# Patient Record
Sex: Female | Born: 1955
Health system: Southern US, Community
[De-identification: ages and names within clinical notes are randomized; demographics above are authoritative.]

## PROBLEM LIST (undated history)

## (undated) DIAGNOSIS — R110 Nausea: Secondary | ICD-10-CM

## (undated) DIAGNOSIS — J449 Chronic obstructive pulmonary disease, unspecified: Secondary | ICD-10-CM

## (undated) DIAGNOSIS — I10 Essential (primary) hypertension: Secondary | ICD-10-CM

## (undated) DIAGNOSIS — R140 Abdominal distension (gaseous): Secondary | ICD-10-CM

## (undated) DIAGNOSIS — K219 Gastro-esophageal reflux disease without esophagitis: Secondary | ICD-10-CM

## (undated) DIAGNOSIS — K8050 Calculus of bile duct without cholangitis or cholecystitis without obstruction: Secondary | ICD-10-CM

## (undated) DIAGNOSIS — R197 Diarrhea, unspecified: Secondary | ICD-10-CM

## (undated) DIAGNOSIS — M1990 Unspecified osteoarthritis, unspecified site: Secondary | ICD-10-CM

## (undated) DIAGNOSIS — J45909 Unspecified asthma, uncomplicated: Secondary | ICD-10-CM

## (undated) HISTORY — PX: LAPAROSCOPY: SHX197

## (undated) HISTORY — DX: Essential (primary) hypertension: I10

## (undated) HISTORY — DX: Unspecified osteoarthritis, unspecified site: M19.90

## (undated) HISTORY — DX: Nausea: R11.0

## (undated) HISTORY — DX: Abdominal distension (gaseous): R14.0

## (undated) HISTORY — DX: Diarrhea, unspecified: R19.7

## (undated) HISTORY — DX: Calculus of bile duct without cholangitis or cholecystitis without obstruction: K80.50

---

## 1998-02-13 ENCOUNTER — Emergency Department (HOSPITAL_COMMUNITY): Admission: EM | Admit: 1998-02-13 | Discharge: 1998-02-13 | Payer: Self-pay | Admitting: Emergency Medicine

## 1998-02-13 ENCOUNTER — Encounter: Payer: Self-pay | Admitting: Emergency Medicine

## 1998-11-06 ENCOUNTER — Emergency Department (HOSPITAL_COMMUNITY): Admission: EM | Admit: 1998-11-06 | Discharge: 1998-11-06 | Payer: Self-pay | Admitting: Emergency Medicine

## 2000-06-24 ENCOUNTER — Encounter: Payer: Self-pay | Admitting: Gastroenterology

## 2000-06-24 ENCOUNTER — Encounter: Admission: RE | Admit: 2000-06-24 | Discharge: 2000-06-24 | Payer: Self-pay | Admitting: Gastroenterology

## 2001-06-23 ENCOUNTER — Encounter (INDEPENDENT_AMBULATORY_CARE_PROVIDER_SITE_OTHER): Payer: Self-pay | Admitting: Specialist

## 2001-06-23 ENCOUNTER — Ambulatory Visit (HOSPITAL_COMMUNITY): Admission: RE | Admit: 2001-06-23 | Discharge: 2001-06-23 | Payer: Self-pay | Admitting: Gastroenterology

## 2001-07-01 ENCOUNTER — Ambulatory Visit (HOSPITAL_COMMUNITY): Admission: RE | Admit: 2001-07-01 | Discharge: 2001-07-01 | Payer: Self-pay | Admitting: Gastroenterology

## 2001-07-01 ENCOUNTER — Encounter: Payer: Self-pay | Admitting: Gastroenterology

## 2001-07-13 ENCOUNTER — Encounter: Payer: Self-pay | Admitting: Gastroenterology

## 2001-07-13 ENCOUNTER — Ambulatory Visit (HOSPITAL_COMMUNITY): Admission: RE | Admit: 2001-07-13 | Discharge: 2001-07-13 | Payer: Self-pay | Admitting: Gastroenterology

## 2001-09-23 ENCOUNTER — Encounter: Admission: RE | Admit: 2001-09-23 | Discharge: 2001-09-23 | Payer: Self-pay | Admitting: Internal Medicine

## 2001-09-23 ENCOUNTER — Encounter: Payer: Self-pay | Admitting: Internal Medicine

## 2005-02-25 HISTORY — PX: OTHER SURGICAL HISTORY: SHX169

## 2005-08-23 ENCOUNTER — Ambulatory Visit: Payer: Self-pay

## 2006-05-21 ENCOUNTER — Ambulatory Visit: Payer: Self-pay

## 2006-05-27 ENCOUNTER — Ambulatory Visit: Payer: Self-pay

## 2006-11-16 ENCOUNTER — Emergency Department (HOSPITAL_COMMUNITY): Admission: EM | Admit: 2006-11-16 | Discharge: 2006-11-16 | Payer: Self-pay | Admitting: Family Medicine

## 2007-07-19 ENCOUNTER — Emergency Department (HOSPITAL_COMMUNITY): Admission: EM | Admit: 2007-07-19 | Discharge: 2007-07-19 | Payer: Self-pay | Admitting: Emergency Medicine

## 2008-03-04 ENCOUNTER — Emergency Department (HOSPITAL_COMMUNITY): Admission: EM | Admit: 2008-03-04 | Discharge: 2008-03-04 | Payer: Self-pay | Admitting: Family Medicine

## 2008-12-25 ENCOUNTER — Emergency Department (HOSPITAL_COMMUNITY): Admission: EM | Admit: 2008-12-25 | Discharge: 2008-12-25 | Payer: Self-pay | Admitting: Emergency Medicine

## 2010-06-11 LAB — URINE CULTURE
Colony Count: NO GROWTH
Culture: NO GROWTH

## 2010-06-11 LAB — POCT URINALYSIS DIP (DEVICE)
Glucose, UA: 100 mg/dL — AB
Protein, ur: 30 mg/dL — AB
Specific Gravity, Urine: 1.015 (ref 1.005–1.030)
Urobilinogen, UA: 2 mg/dL — ABNORMAL HIGH (ref 0.0–1.0)

## 2010-07-13 NOTE — Op Note (Signed)
[place]  Patient:    Sue Mitchell, Sue Mitchell Visit Number: [id number] MRN: [id number]          Service Type: END Location: ENDO Attending Physician:  Charna Elizabeth Dictated by:   Anselmo Rod, M.D. Proc. Date: 06/23/01 Admit Date:  06/23/2001   CC:         Kern Reap, M.D.   Operative Report  DATE OF BIRTH:  [birth year]  REFERRING PHYSICIAN:  Kern Reap, M.D.  PROCEDURE PERFORMED:  Colonoscopy with biopsies times two.  ENDOSCOPIST:  Anselmo Rod, M.D.  INSTRUMENT USED:  Olympus video colonoscope.  INDICATIONS FOR PROCEDURE:  The patient is a 55 year old African-American female with a history of rectal bleeding and change in bowel habits, rule out colonic polyps, masses, hemorrhoids, etc.  PREPROCEDURE PREPARATION:  Informed consent was procured from the patient. The patient was fasted for eight hours prior to the procedure and prepped with a bottle of magnesium citrate and a gallon of NuLytely the night prior to the procedure.  PREPROCEDURE PHYSICAL:  The patient had stable vital signs.  Neck supple. Chest clear to auscultation.  S1, S2 regular.  Abdomen soft with normal bowel sounds.  DESCRIPTION OF PROCEDURE:  The patient was placed in the left lateral decubitus position and sedated with 100 mg of Demerol and 8 mg of Versed intravenously.  Once the patient was adequately sedated and maintained on low-flow oxygen and continuous cardiac monitoring, the Olympus video colonoscope was advanced from the rectum to the cecum without difficulty. There was evidence of pandiverticular disease with more prominent changes in the left colon.  A small polyp  was biopsied from the rectosigmoid area.  No other abnormalities were noted.  The appendicular orifice and ileocecal valve were clearly visualized and photographed.  The terminal ileum appeared normal.  IMPRESSION: 1. Pandiverticulosis. 2. Small polyp biopsied from  rectosigmoid area. 3. No large masses or polyps seen. 4. No evidence of hemorrhoids.  RECOMMENDATIONS: 1. A high fiber diet has been recommended. 2. Await pathology results. 3. Outpatient follow-up in the next two weeks.Dictated by:   Anselmo Rod, M.D. Attending Physician:  Charna Elizabeth DD:  06/24/01 TD:  06/24/01 Job: [id number]

## 2010-07-31 ENCOUNTER — Ambulatory Visit
Admission: RE | Admit: 2010-07-31 | Discharge: 2010-07-31 | Disposition: A | Discharge status: Home / Self Care | Payer: BC Managed Care – PPO | Source: Ambulatory Visit | Attending: Internal Medicine | Admitting: Internal Medicine

## 2010-07-31 ENCOUNTER — Other Ambulatory Visit: Payer: Self-pay | Admitting: Internal Medicine

## 2010-07-31 DIAGNOSIS — R0989 Other specified symptoms and signs involving the circulatory and respiratory systems: Secondary | ICD-10-CM

## 2010-12-13 ENCOUNTER — Other Ambulatory Visit (HOSPITAL_COMMUNITY): Payer: Self-pay | Admitting: Internal Medicine

## 2010-12-13 ENCOUNTER — Ambulatory Visit (HOSPITAL_COMMUNITY)
Admission: RE | Admit: 2010-12-13 | Discharge: 2010-12-13 | Disposition: A | Discharge status: Home / Self Care | Payer: BC Managed Care – PPO | Source: Ambulatory Visit | Attending: Internal Medicine | Admitting: Internal Medicine

## 2010-12-13 DIAGNOSIS — R1011 Right upper quadrant pain: Secondary | ICD-10-CM

## 2010-12-13 DIAGNOSIS — R1031 Right lower quadrant pain: Secondary | ICD-10-CM

## 2010-12-13 DIAGNOSIS — K5730 Diverticulosis of large intestine without perforation or abscess without bleeding: Secondary | ICD-10-CM | POA: Insufficient documentation

## 2010-12-13 DIAGNOSIS — K5792 Diverticulitis of intestine, part unspecified, without perforation or abscess without bleeding: Secondary | ICD-10-CM

## 2010-12-13 LAB — COMPREHENSIVE METABOLIC PANEL
ALT: 19 U/L (ref 0–35)
AST: 20 U/L (ref 0–37)
Alkaline Phosphatase: 77 U/L (ref 39–117)
CO2: 29 mEq/L (ref 19–32)
Chloride: 102 mEq/L (ref 96–112)
GFR calc non Af Amer: 70 mL/min — ABNORMAL LOW (ref >90)
Glucose, Bld: 82 mg/dL (ref 70–99)
Potassium: 3.5 mEq/L (ref 3.5–5.1)
Sodium: 140 mEq/L (ref 135–145)
Total Bilirubin: 0.4 mg/dL (ref 0.3–1.2)

## 2010-12-13 LAB — LIPID PANEL
HDL: 75 mg/dL (ref >39)
LDL Cholesterol: 97 mg/dL (ref 0–99)

## 2010-12-13 LAB — DIFFERENTIAL
Basophils Relative: 0 % (ref 0–1)
Eosinophils Absolute: 0.1 10*3/uL (ref 0.0–0.7)
Neutro Abs: 3.5 10*3/uL (ref 1.7–7.7)
Neutrophils Relative %: 40 % — ABNORMAL LOW (ref 43–77)

## 2010-12-13 LAB — CBC
Hemoglobin: 15.2 g/dL — ABNORMAL HIGH (ref 12.0–15.0)
Platelets: 191 10*3/uL (ref 150–400)
RBC: 4.76 MIL/uL (ref 3.87–5.11)
WBC: 8.7 10*3/uL (ref 4.0–10.5)

## 2010-12-13 MED ORDER — IOHEXOL 300 MG/ML  SOLN
100.0000 mL | Freq: Once | INTRAMUSCULAR | Status: AC | PRN
  Administered 2010-12-13: 100 mL via INTRAVENOUS

## 2011-02-22 ENCOUNTER — Other Ambulatory Visit (HOSPITAL_COMMUNITY): Payer: Self-pay | Admitting: Internal Medicine

## 2011-02-22 DIAGNOSIS — R1011 Right upper quadrant pain: Secondary | ICD-10-CM

## 2011-02-28 ENCOUNTER — Ambulatory Visit (HOSPITAL_COMMUNITY)
Admission: RE | Admit: 2011-02-28 | Discharge: 2011-02-28 | Disposition: A | Discharge status: Home / Self Care | Payer: BC Managed Care – PPO | Source: Ambulatory Visit | Attending: Internal Medicine | Admitting: Internal Medicine

## 2011-02-28 DIAGNOSIS — R1011 Right upper quadrant pain: Secondary | ICD-10-CM | POA: Insufficient documentation

## 2011-02-28 DIAGNOSIS — K7689 Other specified diseases of liver: Secondary | ICD-10-CM | POA: Insufficient documentation

## 2011-03-21 ENCOUNTER — Encounter (INDEPENDENT_AMBULATORY_CARE_PROVIDER_SITE_OTHER): Payer: Self-pay | Admitting: General Surgery

## 2011-03-21 ENCOUNTER — Ambulatory Visit (INDEPENDENT_AMBULATORY_CARE_PROVIDER_SITE_OTHER): Payer: BC Managed Care – PPO | Admitting: General Surgery

## 2011-03-21 VITALS — BP 168/100 | HR 76 | Temp 97.9°F | Resp 18 | Ht 59.0 in | Wt 171.0 lb

## 2011-03-21 DIAGNOSIS — R1084 Generalized abdominal pain: Secondary | ICD-10-CM

## 2011-03-21 NOTE — Progress Notes (Signed)
Patient ID: Sue Mitchell, female   DOB: Apr 03, 1955, 56 y.o.   MRN: [id number]  Chief Complaint  Patient presents with  . Other    Eval biliary colic - patient having pain    HPI Sue Mitchell is a 56 y.o. female.  This patient is referred by Dr. Lovell Sheehan for evaluation of several month history of generalized postprandial abdominal pain and nausea. She states that she has had abdominal pain and nausea almost every time she eats and she describes the pain as "middle" pain across her lower abdomen which feels like she needs to go to the bathroom. She also complains of some right upper quadrant "tenderness" and some nausea but she rarely has any vomiting. She has a history of chronic constipation she states that this constipation will suddenly turned to loose stools and occasionally diarrhea. She reports formed but "pasty" stools and foul-smelling flatus. She has a significant bloating increased amount of intestinal gas. She denies any fevers but she has hot flashes. She denies any blood in the stool or melena. She does have reflux and she gets relief with Zantac. She states that her pain began after eating and lasts until she goes to the bathroom and then she gets relief of his discomfort. She does have a history of lactose intolerance and has had a colonoscopy approximately 10 years ago by Dr. Loreta Ave. She had a recent ultrasound which demonstrated no evidence of cholelithiasis and she had a HIDA scan many years ago which was normal. HPI  Past Medical History  Diagnosis Date  . Arthritis   . Hypertension   . Abdominal pain   . Abdominal distension   . Diarrhea   . Nausea   . Biliary colic     Past Surgical History  Procedure Date  . Cesarean section 1976  . Laparoscopy 1992 (approx)     abdominal  . Cartilage removal 2007    left knee    History reviewed. No pertinent family history.  Social History History  Substance Use Topics  . Smoking status: Never Smoker   . Smokeless  tobacco: Never Used  . Alcohol Use: Yes     occasional 2 per month    Allergies  Allergen Reactions  . Codeine Hives, Itching and Other (See Comments)    All over the body, including burning sensation.    Current Outpatient Prescriptions  Medication Sig Dispense Refill  . acyclovir (ZOVIRAX) 200 MG capsule as needed.      Marland Kitchen aspirin 325 MG tablet Take 325 mg by mouth as needed.      . Cholecalciferol (VITAMIN D PO) Take by mouth daily.      Marland Kitchen losartan (COZAAR) 25 MG tablet Take by mouth daily. Patient takes 1/2 of pill per dosage.        Review of Systems Review of Systems All other review of systems negative or noncontributory except as stated in the HPI  Blood pressure 168/100, pulse 76, temperature 97.9 F (36.6 C), temperature source Temporal, resp. rate 18, height 4\' 11"  (1.499 m), weight 171 lb (77.565 kg).  Physical Exam Physical Exam Physical Exam  Vitals reviewed. Constitutional: He is oriented to person, place, and time. He appears well-developed and well-nourished. No distress.  HENT:  Head: Normocephalic and atraumatic.  Mouth/Throat: No oropharyngeal exudate.  Eyes: Conjunctivae and EOM are normal. Pupils are equal, round, and reactive to light. Right eye exhibits no discharge. Left eye exhibits no discharge. No scleral icterus.  Neck: Normal range of motion. No  tracheal deviation present.  Cardiovascular: Normal rate, regular rhythm and normal heart sounds.   Pulmonary/Chest: Effort normal and breath sounds normal. No stridor. No respiratory distress. He has no wheezes. He has no rales. He exhibits no tenderness.  Abdominal: Soft. Bowel sounds are normal. He exhibits no distension and no mass. There is no significant tenderness but she does say that she has some mild LLQ tenderness on her exam today. There is no rebound and no guarding.  Musculoskeletal: Normal range of motion. He exhibits no edema and no tenderness.  Neurological: He is alert and oriented to  person, place, and time.  Skin: Skin is warm and dry. No rash noted. He is not diaphoretic. No erythema. No pallor.  Psychiatric: He has a normal mood and affect. His behavior is normal. Judgment and thought content normal.    Data Reviewed [place], HIDA  Assessment    Abdominal pain and irregular bowels No her symptoms could certainly be due to biliary dyskinesia we had no objective evidence to indicate that her gallbladder is culprit of her discomfort. She does have some symptoms which could be attributed to this prior history but she also has other symptoms which shy away from gallbladder type symptoms.  I have recommended that she have a repeat colonoscopy and followup with her gastroenterologist for further evaluation and workup of her abdominal pain. I have requested a HIDA scan as well. I am not convinced at this time that cholecystectomy would relieve her discomfort.    Plan    I placed a consult to gastroenterology for colonoscopy and further evaluation. I've also ordered a HIDA scan as well. She will followup with me after her HIDA scan and her GI evaluation to further discuss whether cholecystectomy will be performed.       Lodema Pilot DAVID 03/21/2011, 12:06 PM

## 2011-03-28 ENCOUNTER — Encounter (HOSPITAL_COMMUNITY)
Admission: RE | Admit: 2011-03-28 | Discharge: 2011-03-28 | Disposition: A | Discharge status: Home / Self Care | Payer: BC Managed Care – PPO | Source: Ambulatory Visit | Attending: General Surgery | Admitting: General Surgery

## 2011-03-28 DIAGNOSIS — R1084 Generalized abdominal pain: Secondary | ICD-10-CM | POA: Insufficient documentation

## 2011-03-28 MED ORDER — TECHNETIUM TC 99M MEBROFENIN IV KIT
5.0000 | PACK | Freq: Once | INTRAVENOUS | Status: AC | PRN
  Administered 2011-03-28: 5 via INTRAVENOUS

## 2011-03-28 MED ORDER — SINCALIDE 5 MCG IJ SOLR
INTRAMUSCULAR | Status: AC
  Administered 2011-03-28: 1.55 ug
  Filled 2011-03-28: qty 5

## 2011-03-28 MED ORDER — SINCALIDE 5 MCG IJ SOLR: 0.0200 ug/kg | Freq: Once | INTRAMUSCULAR | Status: DC

## 2011-04-03 ENCOUNTER — Telehealth (INDEPENDENT_AMBULATORY_CARE_PROVIDER_SITE_OTHER): Payer: Self-pay | Admitting: General Surgery

## 2011-04-05 NOTE — Telephone Encounter (Signed)
Patient would like her HIDA scan results, please review and advise.

## 2011-04-24 ENCOUNTER — Encounter (INDEPENDENT_AMBULATORY_CARE_PROVIDER_SITE_OTHER): Payer: BC Managed Care – PPO | Admitting: General Surgery

## 2011-08-15 ENCOUNTER — Encounter (INDEPENDENT_AMBULATORY_CARE_PROVIDER_SITE_OTHER): Payer: Self-pay

## 2011-11-29 ENCOUNTER — Emergency Department (HOSPITAL_COMMUNITY)
Admission: EM | Admit: 2011-11-29 | Discharge: 2011-11-30 | Disposition: A | Discharge status: Home / Self Care | Payer: BC Managed Care – PPO | Attending: Emergency Medicine | Admitting: Emergency Medicine

## 2011-11-29 ENCOUNTER — Encounter (HOSPITAL_COMMUNITY): Payer: Self-pay | Admitting: *Deleted

## 2011-11-29 DIAGNOSIS — M129 Arthropathy, unspecified: Secondary | ICD-10-CM | POA: Insufficient documentation

## 2011-11-29 DIAGNOSIS — Z885 Allergy status to narcotic agent status: Secondary | ICD-10-CM | POA: Insufficient documentation

## 2011-11-29 DIAGNOSIS — Z87891 Personal history of nicotine dependence: Secondary | ICD-10-CM | POA: Insufficient documentation

## 2011-11-29 DIAGNOSIS — Z79899 Other long term (current) drug therapy: Secondary | ICD-10-CM | POA: Insufficient documentation

## 2011-11-29 DIAGNOSIS — S161XXA Strain of muscle, fascia and tendon at neck level, initial encounter: Secondary | ICD-10-CM

## 2011-11-29 DIAGNOSIS — M25519 Pain in unspecified shoulder: Secondary | ICD-10-CM | POA: Insufficient documentation

## 2011-11-29 DIAGNOSIS — I10 Essential (primary) hypertension: Secondary | ICD-10-CM | POA: Insufficient documentation

## 2011-11-29 DIAGNOSIS — Z7982 Long term (current) use of aspirin: Secondary | ICD-10-CM | POA: Insufficient documentation

## 2011-11-29 DIAGNOSIS — S139XXA Sprain of joints and ligaments of unspecified parts of neck, initial encounter: Secondary | ICD-10-CM | POA: Insufficient documentation

## 2011-11-29 MED ORDER — IBUPROFEN 200 MG PO TABS
600.0000 mg | ORAL_TABLET | Freq: Once | ORAL | Status: AC
  Administered 2011-11-30: 600 mg via ORAL
  Filled 2011-11-29: qty 1

## 2011-11-29 MED ORDER — HYDROCODONE-ACETAMINOPHEN 5-325 MG PO TABS
1.0000 | ORAL_TABLET | Freq: Once | ORAL | Status: AC
  Administered 2011-11-30: 1 via ORAL
  Filled 2011-11-29: qty 1

## 2011-11-29 MED ORDER — CYCLOBENZAPRINE HCL 10 MG PO TABS
5.0000 mg | ORAL_TABLET | Freq: Once | ORAL | Status: AC
  Administered 2011-11-30: 5 mg via ORAL
  Filled 2011-11-29: qty 1

## 2011-11-29 NOTE — ED Notes (Signed)
Received bedside report form Jenna, RN.  Patient currently resting quietly in bed; no respiratory or acute distress noted.  Patient updated on plan of care; informed patient that we are currently waiting for further orders from EDP/NP.  Patient has no other questions or concerns at this time; will continue to monitor. 
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
## 2011-11-30 MED ORDER — CYCLOBENZAPRINE HCL 5 MG PO TABS: 5.0000 mg | ORAL_TABLET | Freq: Two times a day (BID) | ORAL | Status: DC | PRN

## 2011-11-30 MED ORDER — HYDROCODONE-ACETAMINOPHEN 5-325 MG PO TABS: 1.0000 | ORAL_TABLET | Freq: Four times a day (QID) | ORAL | Status: DC | PRN

## 2011-11-30 MED ORDER — IBUPROFEN 600 MG PO TABS: 600.0000 mg | ORAL_TABLET | Freq: Once | ORAL | Status: DC

## 2011-11-30 NOTE — ED Notes (Signed)
Patient given discharge paperwork; went over discharge instructions with patient.  Patient instructed to take prescriptions as directed, to not drive while taking narcotics, and to return to the ED for new, worsening, or concerning symptoms.
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
## 2011-12-01 NOTE — ED Provider Notes (Signed)
Medical screening examination/treatment/procedure(s) were performed by non-physician practitioner and as supervising physician I was immediately available for consultation/collaboration.    Candance Bohlman D Yuki Purves, MD 12/01/11 0134 

## 2012-03-14 ENCOUNTER — Encounter (HOSPITAL_COMMUNITY): Payer: Self-pay | Admitting: Emergency Medicine

## 2012-03-14 ENCOUNTER — Emergency Department (HOSPITAL_COMMUNITY)
Admission: EM | Admit: 2012-03-14 | Discharge: 2012-03-14 | Disposition: A | Discharge status: Home / Self Care | Payer: BC Managed Care – PPO | Attending: Emergency Medicine | Admitting: Emergency Medicine

## 2012-03-14 ENCOUNTER — Emergency Department (HOSPITAL_COMMUNITY): Payer: BC Managed Care – PPO

## 2012-03-14 DIAGNOSIS — Z79899 Other long term (current) drug therapy: Secondary | ICD-10-CM | POA: Insufficient documentation

## 2012-03-14 DIAGNOSIS — R059 Cough, unspecified: Secondary | ICD-10-CM | POA: Insufficient documentation

## 2012-03-14 DIAGNOSIS — R0602 Shortness of breath: Secondary | ICD-10-CM | POA: Insufficient documentation

## 2012-03-14 DIAGNOSIS — Z87891 Personal history of nicotine dependence: Secondary | ICD-10-CM | POA: Insufficient documentation

## 2012-03-14 DIAGNOSIS — Z7982 Long term (current) use of aspirin: Secondary | ICD-10-CM | POA: Insufficient documentation

## 2012-03-14 DIAGNOSIS — Z8739 Personal history of other diseases of the musculoskeletal system and connective tissue: Secondary | ICD-10-CM | POA: Insufficient documentation

## 2012-03-14 DIAGNOSIS — Z8719 Personal history of other diseases of the digestive system: Secondary | ICD-10-CM | POA: Insufficient documentation

## 2012-03-14 DIAGNOSIS — R112 Nausea with vomiting, unspecified: Secondary | ICD-10-CM | POA: Insufficient documentation

## 2012-03-14 DIAGNOSIS — R498 Other voice and resonance disorders: Secondary | ICD-10-CM | POA: Insufficient documentation

## 2012-03-14 DIAGNOSIS — R05 Cough: Secondary | ICD-10-CM | POA: Insufficient documentation

## 2012-03-14 DIAGNOSIS — J111 Influenza due to unidentified influenza virus with other respiratory manifestations: Secondary | ICD-10-CM

## 2012-03-14 DIAGNOSIS — I10 Essential (primary) hypertension: Secondary | ICD-10-CM | POA: Insufficient documentation

## 2012-03-14 DIAGNOSIS — R51 Headache: Secondary | ICD-10-CM | POA: Insufficient documentation

## 2012-03-14 MED ORDER — ALBUTEROL SULFATE HFA 108 (90 BASE) MCG/ACT IN AERS: 2.0000 | INHALATION_SPRAY | Freq: Four times a day (QID) | RESPIRATORY_TRACT | Status: DC | PRN

## 2012-03-14 MED ORDER — PROMETHAZINE HCL 25 MG PO TABS: 25.0000 mg | ORAL_TABLET | Freq: Four times a day (QID) | ORAL | Status: DC | PRN

## 2012-03-14 MED ORDER — ONDANSETRON 4 MG PO TBDP: 4.0000 mg | ORAL_TABLET | Freq: Three times a day (TID) | ORAL | Status: DC | PRN

## 2012-03-14 MED ORDER — GUAIFENESIN-CODEINE 100-10 MG/5ML PO SYRP: 5.0000 mL | ORAL_SOLUTION | Freq: Three times a day (TID) | ORAL | Status: DC | PRN

## 2012-03-14 NOTE — ED Provider Notes (Signed)
Medical screening examination/treatment/procedure(s) were performed by non-physician practitioner and as supervising physician I was immediately available for consultation/collaboration.  Meher Kucinski R. Favor Kreh, MD 03/14/12 2323 

## 2012-03-14 NOTE — ED Notes (Signed)
Patient reports that she has had a cough all night long and has a HA since she has started coughing

## 2012-03-14 NOTE — ED Provider Notes (Signed)
History  Scribed for Marlon Pel, PA-C/ Juliet Rude. Rubin Payor, MD, the patient was seen in room WTR8/WTR8. This chart was scribed by Candelaria Stagers. The patient's care started at 3:16 PM    CSN: [id number]  Arrival date & time 03/14/12  1312   First MD Initiated Contact with Patient 03/14/12 1512      Chief Complaint  Patient presents with  . Cough     The history is provided by the patient. No language interpreter was used.   Sue Mitchell is a 57 y.o. female who presents to the Emergency Department complaining of a cough that started several days ago.  Pt has also experiencing decreased voice, headache, SOB with coughing, nausea, and vomiting from coughing.  She reports that the cough is worse at night.  She has taken alka seltzer with no relief.  Pt does not smoke.  Pt did have a flue shot.  Pt is to return to work tomorrow.     Past Medical History  Diagnosis Date  . Arthritis   . Hypertension   . Abdominal pain   . Abdominal distension   . Diarrhea   . Nausea   . Biliary colic     Past Surgical History  Procedure Date  . Cesarean section 1976  . Laparoscopy 1992 (approx)     abdominal  . Cartilage removal 2007    left knee    History reviewed. No pertinent family history.  History  Substance Use Topics  . Smoking status: Former Games developer  . Smokeless tobacco: Never Used  . Alcohol Use: Yes     Comment: occasional 2 per month    OB History    Grav Para Term Preterm Abortions TAB SAB Ect Mult Living                  Review of Systems  Constitutional: Negative for fever and chills.  HENT: Positive for voice change. Negative for congestion and sore throat.   Respiratory: Positive for cough and shortness of breath.   Gastrointestinal: Positive for nausea and vomiting. Negative for abdominal pain.  Skin: Negative for rash.  Neurological: Negative for weakness.  All other systems reviewed and are negative.    Allergies  Codeine and  Ciprofloxacin  Home Medications   Current Outpatient Rx  Name  Route  Sig  Dispense  Refill  . ACYCLOVIR 200 MG PO CAPS   Oral   Take 200 mg by mouth 2 (two) times daily.          . ASPIRIN 325 MG PO TABS   Oral   Take 325 mg by mouth every morning.          Marland Kitchen VITAMIN D 1000 UNITS PO TABS   Oral   Take 5,000 Units by mouth 2 (two) times a week.         Marland Kitchen LOSARTAN POTASSIUM-HCTZ 100-25 MG PO TABS   Oral   Take 1 tablet by mouth every morning.         . ALBUTEROL SULFATE HFA 108 (90 BASE) MCG/ACT IN AERS   Inhalation   Inhale 2 puffs into the lungs every 6 (six) hours as needed for wheezing.   1 Inhaler   2   . ONDANSETRON 4 MG PO TBDP   Oral   Take 1 tablet (4 mg total) by mouth every 8 (eight) hours as needed for nausea.   20 tablet   0   . PROMETHAZINE HCL 25 MG PO TABS  Oral   Take 1 tablet (25 mg total) by mouth every 6 (six) hours as needed for nausea.   12 tablet   0     BP 112/95  Pulse 84  Temp 98.9 F (37.2 C) (Oral)  Resp 18  SpO2 100%  Physical Exam  Nursing note and vitals reviewed. Constitutional: She is oriented to person, place, and time. She appears well-developed and well-nourished. No distress.  HENT:  Head: Normocephalic and atraumatic.  Right Ear: Tympanic membrane normal.  Left Ear: Tympanic membrane normal.  Mouth/Throat: No oropharyngeal exudate.  Eyes: EOM are normal.  Neck: Neck supple. No tracheal deviation present.  Cardiovascular: Normal rate and regular rhythm.   Pulmonary/Chest: Effort normal and breath sounds normal. No respiratory distress. She has no wheezes. She has no rales.       Coughing during exam  Musculoskeletal: Normal range of motion.  Neurological: She is alert and oriented to person, place, and time.  Skin: Skin is warm and dry.  Psychiatric: She has a normal mood and affect. Her behavior is normal.    ED Course  Procedures   DIAGNOSTIC STUDIES: Oxygen Saturation is 100% on room air, normal by  my interpretation.    COORDINATION OF CARE:  2:05PM DG Chest 2 View  3:24PM Discharged with: promethazine (PHEGRAN) 25MG  tablet; albuterol (PROVENTIL HFA; VENTOLIN HFA) 108 (90 BASE) MCB/ACT inhaler; ondansetron (ZOFRAN ODT) 4mg  disintegrating tablet  Labs Reviewed - No data to display Dg Chest 2 View  03/14/2012  *RADIOLOGY REPORT*  Clinical Data: Cough and congestion.  Fever for 3 days.  Ex-smoker with history of hypertension.  CHEST - 2 VIEW  Comparison: 07/31/2010 and 12/25/2008.  Findings: Midline trachea.  Normal heart size.  Stable cardiomediastinal contours.  The right suprahilar region is similarly borderline full back to 2010; likely within normal variation. No pleural effusion or pneumothorax.  Clear lungs.  Diffuse peribronchial thickening.  IMPRESSION: 1.  No acute cardiopulmonary disease. 2.  Mild peribronchial thickening which may relate to chronic bronchitis or smoking.   Original Report Authenticated By: Jeronimo Greaves, M.D.      1. Influenza-like illness       MDM  Pt has been advised of the symptoms that warrant their return to the ED. Patient has voiced understanding and has agreed to follow-up with the PCP or specialist.  I personally performed the services described in this documentation, which was scribed in my presence. The recorded information has been reviewed and is accurate.         Dorthula Matas, PA 03/14/12 2159

## 2013-02-21 ENCOUNTER — Emergency Department (HOSPITAL_COMMUNITY)
Admission: EM | Admit: 2013-02-21 | Discharge: 2013-02-21 | Disposition: A | Discharge status: Home / Self Care | Payer: BC Managed Care – PPO | Source: Home / Self Care | Attending: Family Medicine | Admitting: Family Medicine

## 2013-02-21 ENCOUNTER — Encounter (HOSPITAL_COMMUNITY): Payer: Self-pay | Admitting: Emergency Medicine

## 2013-02-21 ENCOUNTER — Emergency Department (INDEPENDENT_AMBULATORY_CARE_PROVIDER_SITE_OTHER): Payer: BC Managed Care – PPO

## 2013-02-21 DIAGNOSIS — J40 Bronchitis, not specified as acute or chronic: Secondary | ICD-10-CM

## 2013-02-21 MED ORDER — IPRATROPIUM BROMIDE 0.06 % NA SOLN: 2.0000 | Freq: Four times a day (QID) | NASAL | Status: DC

## 2013-02-21 MED ORDER — HYDROCODONE-ACETAMINOPHEN 5-325 MG PO TABS: 0.5000 | ORAL_TABLET | Freq: Every evening | ORAL | Status: DC | PRN

## 2013-02-21 MED ORDER — ALBUTEROL SULFATE HFA 108 (90 BASE) MCG/ACT IN AERS: 2.0000 | INHALATION_SPRAY | Freq: Four times a day (QID) | RESPIRATORY_TRACT | Status: DC | PRN

## 2013-02-21 MED ORDER — PREDNISONE 50 MG PO TABS: 50.0000 mg | ORAL_TABLET | Freq: Every day | ORAL | Status: DC

## 2013-02-21 NOTE — ED Provider Notes (Signed)
Sue Mitchell is a 57 y.o. female who presents to Urgent Care today for cough body aches hoarse voice. Symptoms have been present for the last 4 days. Patient tried aspirin and Aleve which help a bit. The cough is quite bothersome at night. No shortness of breath nausea vomiting or diarrhea. Positive sick contacts. Patient feels well otherwise.   Past Medical History  Diagnosis Date  . Arthritis   . Hypertension   . Abdominal pain   . Abdominal distension   . Diarrhea   . Nausea   . Biliary colic    History  Substance Use Topics  . Smoking status: Former Games developer  . Smokeless tobacco: Never Used  . Alcohol Use: Yes     Comment: occasional 2 per month   ROS as above Medications reviewed. No current facility-administered medications for this encounter.   Current Outpatient Prescriptions  Medication Sig Dispense Refill  . acyclovir (ZOVIRAX) 200 MG capsule Take 200 mg by mouth 2 (two) times daily.       Marland Kitchen albuterol (PROVENTIL HFA;VENTOLIN HFA) 108 (90 BASE) MCG/ACT inhaler Inhale 2 puffs into the lungs every 6 (six) hours as needed for wheezing.  1 Inhaler  2  . aspirin 325 MG tablet Take 325 mg by mouth every morning.       . cholecalciferol (VITAMIN D) 1000 UNITS tablet Take 5,000 Units by mouth 2 (two) times a week.      Marland Kitchen HYDROcodone-acetaminophen (NORCO/VICODIN) 5-325 MG per tablet Take 0.5 tablets by mouth at bedtime as needed (cough).  6 tablet  0  . ipratropium (ATROVENT) 0.06 % nasal spray Place 2 sprays into both nostrils 4 (four) times daily.  15 mL  1  . losartan-hydrochlorothiazide (HYZAAR) 100-25 MG per tablet Take 1 tablet by mouth every morning.      . ondansetron (ZOFRAN ODT) 4 MG disintegrating tablet Take 1 tablet (4 mg total) by mouth every 8 (eight) hours as needed for nausea.  20 tablet  0  . predniSONE (DELTASONE) 50 MG tablet Take 1 tablet (50 mg total) by mouth daily.  5 tablet  0  . promethazine (PHENERGAN) 25 MG tablet Take 1 tablet (25 mg total) by mouth  every 6 (six) hours as needed for nausea.  12 tablet  0    Exam:  BP 113/74  Pulse 80  Temp(Src) 98.6 F (37 C) (Oral)  Resp 18  SpO2 94% Gen: Well NAD HEENT: EOMI,  MMM, posterior pharynx with cobblestoning. Tympanic membranes are normal appearing bilaterally. Lungs: Normal work of breathing. Rhonchorous breath sounds bilaterally Heart: RRR no MRG Abd: NABS, Soft. NT, ND Exts: Non edematous BL  LE, warm and well perfused.   No results found for this or any previous visit (from the past 24 hour(s)). Dg Chest 2 View  02/21/2013   CLINICAL DATA:  Cough  EXAM: CHEST  2 VIEW  COMPARISON:  03/14/2012  FINDINGS: Pulmonary hyperinflation. Negative for pneumonia. Negative for heart failure or effusion.  IMPRESSION: Pulmonary hyperinflation.  Negative for pneumonia.   Electronically Signed   By: Marlan Palau M.D.   On: 02/21/2013 19:54    Assessment and Plan: 57 y.o. female with bronchitis. Plan to treat with a but overall prednisone and hydrocodone-based cough medication, and Atrovent nasal spray. Discussed warning signs or symptoms. Please see discharge instructions. Patient expresses understanding.      Rodolph Bong, MD 02/21/13 2007

## 2013-02-21 NOTE — ED Notes (Signed)
C/o cold sx which started christmas night Sx are cough, body ache, headache, chest congestion, and fever Admits to vomiting Thursday and Friday  Denies diarrhea Aleve and asa was taking for relief.

## 2013-02-23 ENCOUNTER — Encounter (HOSPITAL_COMMUNITY): Payer: Self-pay | Admitting: Emergency Medicine

## 2013-02-23 ENCOUNTER — Emergency Department (INDEPENDENT_AMBULATORY_CARE_PROVIDER_SITE_OTHER)
Admission: EM | Admit: 2013-02-23 | Discharge: 2013-02-23 | Disposition: A | Discharge status: Home / Self Care | Payer: BC Managed Care – PPO | Source: Home / Self Care | Attending: Family Medicine | Admitting: Family Medicine

## 2013-02-23 DIAGNOSIS — J40 Bronchitis, not specified as acute or chronic: Secondary | ICD-10-CM

## 2013-02-23 DIAGNOSIS — J209 Acute bronchitis, unspecified: Secondary | ICD-10-CM

## 2013-02-23 MED ORDER — ALBUTEROL SULFATE (2.5 MG/3ML) 0.083% IN NEBU
2.5000 mg | INHALATION_SOLUTION | Freq: Once | RESPIRATORY_TRACT | Status: AC
  Administered 2013-02-23: 2.5 mg via RESPIRATORY_TRACT

## 2013-02-23 MED ORDER — GUAIFENESIN-CODEINE 100-10 MG/5ML PO SYRP: 5.0000 mL | ORAL_SOLUTION | Freq: Three times a day (TID) | ORAL | Status: DC | PRN

## 2013-02-23 MED ORDER — IPRATROPIUM BROMIDE 0.02 % IN SOLN
0.5000 mg | Freq: Once | RESPIRATORY_TRACT | Status: AC
  Administered 2013-02-23: 0.5 mg via RESPIRATORY_TRACT

## 2013-02-23 MED ORDER — AZITHROMYCIN 250 MG PO TABS: 250.0000 mg | ORAL_TABLET | Freq: Every day | ORAL | Status: DC

## 2013-02-23 MED ORDER — IPRATROPIUM BROMIDE 0.02 % IN SOLN
RESPIRATORY_TRACT | Status: AC
  Filled 2013-02-23: qty 2.5

## 2013-02-23 NOTE — ED Notes (Signed)
Cough and wheezing onset 12/25.

## 2013-02-23 NOTE — ED Provider Notes (Signed)
CSN: [id number]     Arrival date & time 02/23/13  1212 History   First MD Initiated Contact with Patient 02/23/13 1504     No chief complaint on file.  (Consider location/radiation/quality/duration/timing/severity/associated sxs/prior Treatment) The history is provided by the patient.   Sue Mitchell is a 57 y.o. female who presents with cough, cold, congestion and wheezing. She was evaluated here 2 days ago for same. She had a chest x-ray that showed no pneumonia. She was prescribed Albuterol inhaler, Hydrocodone and prednisone. She continues to have a persistent cough and wheezing.   Past Medical History  Diagnosis Date  . Arthritis   . Hypertension   . Abdominal pain   . Abdominal distension   . Diarrhea   . Nausea   . Biliary colic    Past Surgical History  Procedure Laterality Date  . Cesarean section  1976  . Laparoscopy  1992 (approx)     abdominal  . Cartilage removal  2007    left knee   No family history on file. History  Substance Use Topics  . Smoking status: Former Games developer  . Smokeless tobacco: Never Used  . Alcohol Use: Yes     Comment: occasional 2 per month   OB History   Grav Para Term Preterm Abortions TAB SAB Ect Mult Living                 Review of Systems  Constitutional: Negative for fever and chills.  HENT: Positive for congestion, sinus pressure and sore throat. Negative for trouble swallowing.   Eyes: Negative for redness and itching.  Respiratory: Positive for cough and wheezing.   Cardiovascular: Negative for chest pain.  Gastrointestinal: Negative for nausea, vomiting and abdominal pain.  Genitourinary: Negative for dysuria and frequency.  Musculoskeletal: Negative for back pain.  Skin: Negative for rash.  Neurological: Negative for syncope and headaches.  Psychiatric/Behavioral: Negative for confusion. The patient is not nervous/anxious.     Allergies  Codeine and Ciprofloxacin  Home Medications   Current Outpatient Rx   Name  Route  Sig  Dispense  Refill  . acyclovir (ZOVIRAX) 200 MG capsule   Oral   Take 200 mg by mouth 2 (two) times daily.          Marland Kitchen albuterol (PROVENTIL HFA;VENTOLIN HFA) 108 (90 BASE) MCG/ACT inhaler   Inhalation   Inhale 2 puffs into the lungs every 6 (six) hours as needed for wheezing.   1 Inhaler   2   . albuterol (PROVENTIL HFA;VENTOLIN HFA) 108 (90 BASE) MCG/ACT inhaler   Inhalation   Inhale 2 puffs into the lungs every 6 (six) hours as needed for wheezing or shortness of breath.   1 Inhaler   2   . aspirin 325 MG tablet   Oral   Take 325 mg by mouth every morning.          . cholecalciferol (VITAMIN D) 1000 UNITS tablet   Oral   Take 5,000 Units by mouth 2 (two) times a week.         Marland Kitchen HYDROcodone-acetaminophen (NORCO/VICODIN) 5-325 MG per tablet   Oral   Take 0.5 tablets by mouth at bedtime as needed (cough).   6 tablet   0   . ipratropium (ATROVENT) 0.06 % nasal spray   Each Nare   Place 2 sprays into both nostrils 4 (four) times daily.   15 mL   1   . losartan-hydrochlorothiazide (HYZAAR) 100-25 MG per tablet   Oral  Take 1 tablet by mouth every morning.         . ondansetron (ZOFRAN ODT) 4 MG disintegrating tablet   Oral   Take 1 tablet (4 mg total) by mouth every 8 (eight) hours as needed for nausea.   20 tablet   0   . predniSONE (DELTASONE) 50 MG tablet   Oral   Take 1 tablet (50 mg total) by mouth daily.   5 tablet   0   . promethazine (PHENERGAN) 25 MG tablet   Oral   Take 1 tablet (25 mg total) by mouth every 6 (six) hours as needed for nausea.   12 tablet   0   Physical Exam  Nursing note and vitals reviewed. Constitutional: She is oriented to person, place, and time. She appears well-developed and well-nourished. No distress.  HENT:  Head: Normocephalic and atraumatic.  Eyes: Conjunctivae and EOM are normal.  Neck: Neck supple.  Cardiovascular: Normal rate.   Pulmonary/Chest: No respiratory distress. She has decreased  breath sounds. She has wheezes. She has no rales.  Inspiratory and expiratory wheezing. Decreased breath sound bilateral.   Abdominal: Soft. There is no tenderness.  Musculoskeletal: Normal range of motion.  Neurological: She is alert and oriented to person, place, and time. No cranial nerve deficit.  Skin: Skin is warm and dry.  Psychiatric: She has a normal mood and affect. Her behavior is normal.    ED Course: I discussed this case with Dr. Denyse Amass.  Procedures  @ 3:44 pm patient re examined after Albuterol/atrovent neb treatment. Lung sounds improved but continues to have wheezing with expiration. Will repeat Albuterol neb. Patient coughing less and states that she is feeling better and breathing easier.  @ 4:00 pm patient re examine and there is occasional expiratory wheezing noted. Much improved  MDM  57 y.o. female with cough and wheezing. Has been treated 2 days ago with prednisone and albuterol inhaler. CXR shows no pneumonia.  Much improved with neb treatments. Patient stable for discharge without any immediate complications. She will continue prednisone and Albuterol. Will add Robitussin AC and Zithromax.   Medication List    TAKE these medications       azithromycin 250 MG tablet  Commonly known as:  ZITHROMAX  Take 1 tablet (250 mg total) by mouth daily. Take first 2 tablets together, then 1 every day until finished.     guaiFENesin-codeine 100-10 MG/5ML syrup  Commonly known as:  ROBITUSSIN AC  Take 5 mLs by mouth 3 (three) times daily as needed for cough.      ASK your doctor about these medications       acyclovir 200 MG capsule  Commonly known as:  ZOVIRAX  Take 200 mg by mouth 2 (two) times daily.     albuterol 108 (90 BASE) MCG/ACT inhaler  Commonly known as:  PROVENTIL HFA;VENTOLIN HFA  Inhale 2 puffs into the lungs every 6 (six) hours as needed for wheezing.     albuterol 108 (90 BASE) MCG/ACT inhaler  Commonly known as:  PROVENTIL HFA;VENTOLIN HFA  Inhale  2 puffs into the lungs every 6 (six) hours as needed for wheezing or shortness of breath.     aspirin 325 MG tablet  Take 325 mg by mouth every morning.     cholecalciferol 1000 UNITS tablet  Commonly known as:  VITAMIN D  Take 5,000 Units by mouth 2 (two) times a week.     HYDROcodone-acetaminophen 5-325 MG per tablet  Commonly known as:  NORCO/VICODIN  Take 0.5 tablets by mouth at bedtime as needed (cough).     ipratropium 0.06 % nasal spray  Commonly known as:  ATROVENT  Place 2 sprays into both nostrils 4 (four) times daily.     losartan-hydrochlorothiazide 100-25 MG per tablet  Commonly known as:  HYZAAR  Take 1 tablet by mouth every morning.     ondansetron 4 MG disintegrating tablet  Commonly known as:  ZOFRAN ODT  Take 1 tablet (4 mg total) by mouth every 8 (eight) hours as needed for nausea.     predniSONE 50 MG tablet  Commonly known as:  DELTASONE  Take 1 tablet (50 mg total) by mouth daily.     promethazine 25 MG tablet  Commonly known as:  PHENERGAN  Take 1 tablet (25 mg total) by mouth every 6 (six) hours as needed for nausea.          [facility] Orlene Och, NP 02/23/13 [phone]

## 2013-02-23 NOTE — ED Provider Notes (Signed)
Medical screening examination/treatment/procedure(s) were performed by a resident physician or non-physician practitioner and as the supervising physician I was immediately available for consultation/collaboration.  Clementeen Graham, MD    Rodolph Bong, MD 02/23/13 [phone]

## 2013-06-10 ENCOUNTER — Encounter (HOSPITAL_COMMUNITY): Payer: Self-pay | Admitting: Emergency Medicine

## 2013-06-10 ENCOUNTER — Emergency Department (HOSPITAL_COMMUNITY)
Admission: EM | Admit: 2013-06-10 | Discharge: 2013-06-10 | Disposition: A | Discharge status: Home / Self Care | Payer: BC Managed Care – PPO | Source: Home / Self Care | Attending: Family Medicine | Admitting: Family Medicine

## 2013-06-10 DIAGNOSIS — N390 Urinary tract infection, site not specified: Secondary | ICD-10-CM

## 2013-06-10 LAB — POCT URINALYSIS DIP (DEVICE)
BILIRUBIN URINE: NEGATIVE
Glucose, UA: NEGATIVE mg/dL
KETONES UR: NEGATIVE mg/dL
LEUKOCYTES UA: NEGATIVE
NITRITE: NEGATIVE
Protein, ur: NEGATIVE mg/dL
Specific Gravity, Urine: 1.015 (ref 1.005–1.030)
Urobilinogen, UA: 0.2 mg/dL (ref 0.0–1.0)
pH: 7 (ref 5.0–8.0)

## 2013-06-10 MED ORDER — CEPHALEXIN 500 MG PO CAPS
500.0000 mg | ORAL_CAPSULE | Freq: Four times a day (QID) | ORAL | Status: DC
Start: 2013-06-10 — End: 2017-02-06

## 2013-06-10 NOTE — ED Notes (Signed)
C/o  Urinary urgency and frequency.  Lower back and pelvic discomfort.  On set last night.  No otc meds taken for symptoms.

## 2013-06-10 NOTE — ED Provider Notes (Signed)
CSN: [id number]     Arrival date & time 06/10/13  1002 History   First MD Initiated Contact with Patient 06/10/13 1220     Chief Complaint  Patient presents with  . Urinary Tract Infection   (Consider location/radiation/quality/duration/timing/severity/associated sxs/prior Treatment) HPI Comments: Pt c/o urinary frequency and urgency since last night. Feels like previous UTIs, last UTI years ago.   Patient is a 58 y.o. female presenting with urinary tract infection. The history is provided by the patient.  Urinary Tract Infection This is a new problem. The current episode started yesterday. The problem occurs constantly. The problem has not changed since onset.Associated symptoms include abdominal pain. Nothing aggravates the symptoms. Nothing relieves the symptoms. She has tried nothing for the symptoms.    Past Medical History  Diagnosis Date  . Arthritis   . Hypertension   . Abdominal pain   . Abdominal distension   . Diarrhea   . Nausea   . Biliary colic    Past Surgical History  Procedure Laterality Date  . Cesarean section  1976  . Laparoscopy  1992 (approx)     abdominal  . Cartilage removal  2007    left knee   History reviewed. No pertinent family history. History  Substance Use Topics  . Smoking status: Former Games developermoker  . Smokeless tobacco: Never Used  . Alcohol Use: Yes     Comment: occasional 2 per month   OB History   Grav Para Term Preterm Abortions TAB SAB Ect Mult Living                 Review of Systems  Constitutional: Negative for fever and chills.  Gastrointestinal: Positive for abdominal pain. Negative for nausea and vomiting.  Genitourinary: Positive for urgency, frequency and flank pain. Negative for vaginal bleeding and vaginal discharge.    Allergies  Codeine and Ciprofloxacin  Home Medications   Prior to Admission medications   Medication Sig Start Date End Date Taking? Authorizing Provider  acyclovir (ZOVIRAX) 200 MG capsule Take 200  mg by mouth 2 (two) times daily.  01/22/11  Yes Historical Provider, MD  losartan-hydrochlorothiazide (HYZAAR) 100-25 MG per tablet Take 1 tablet by mouth every morning.   Yes Historical Provider, MD  albuterol (PROVENTIL HFA;VENTOLIN HFA) 108 (90 BASE) MCG/ACT inhaler Inhale 2 puffs into the lungs every 6 (six) hours as needed for wheezing. 03/14/12   Tiffany Irine SealG Greene, PA-C  albuterol (PROVENTIL HFA;VENTOLIN HFA) 108 (90 BASE) MCG/ACT inhaler Inhale 2 puffs into the lungs every 6 (six) hours as needed for wheezing or shortness of breath. 02/21/13   Rodolph BongEvan S Corey, MD  aspirin 325 MG tablet Take 325 mg by mouth every morning.     Historical Provider, MD  azithromycin (ZITHROMAX) 250 MG tablet Take 1 tablet (250 mg total) by mouth daily. Take first 2 tablets together, then 1 every day until finished. 02/23/13   Hope Orlene OchM Neese, NP  cephALEXin (KEFLEX) 500 MG capsule Take 1 capsule (500 mg total) by mouth 4 (four) times daily. 06/10/13   Cathlyn ParsonsAngela M Williom Cedar, NP  cholecalciferol (VITAMIN D) 1000 UNITS tablet Take 5,000 Units by mouth 2 (two) times a week.    Historical Provider, MD  guaiFENesin-codeine (ROBITUSSIN AC) 100-10 MG/5ML syrup Take 5 mLs by mouth 3 (three) times daily as needed for cough. 02/23/13   Hope Orlene OchM Neese, NP  HYDROcodone-acetaminophen (NORCO/VICODIN) 5-325 MG per tablet Take 0.5 tablets by mouth at bedtime as needed (cough). 02/21/13   Rodolph BongEvan S Corey,  MD  ipratropium (ATROVENT) 0.06 % nasal spray Place 2 sprays into both nostrils 4 (four) times daily. 02/21/13   Rodolph BongEvan S Corey, MD  ondansetron (ZOFRAN ODT) 4 MG disintegrating tablet Take 1 tablet (4 mg total) by mouth every 8 (eight) hours as needed for nausea. 03/14/12   Tiffany Irine SealG Greene, PA-C  predniSONE (DELTASONE) 50 MG tablet Take 1 tablet (50 mg total) by mouth daily. 02/21/13   Rodolph BongEvan S Corey, MD  promethazine (PHENERGAN) 25 MG tablet Take 1 tablet (25 mg total) by mouth every 6 (six) hours as needed for nausea. 03/14/12   Tiffany Irine SealG Greene, PA-C    BP 154/80  Pulse 70  Temp(Src) 98.1 F (36.7 C) (Oral)  Resp 16  SpO2 97% Physical Exam  Constitutional: She appears well-developed and well-nourished. No distress.  Cardiovascular: Normal rate and regular rhythm.   Pulmonary/Chest: Effort normal and breath sounds normal.  Abdominal: Soft. Bowel sounds are normal. She exhibits no distension. There is tenderness in the suprapubic area. There is no CVA tenderness.    ED Course  Procedures (including critical care time) Labs Review Labs Reviewed  POCT URINALYSIS DIP (DEVICE) - Abnormal; Notable for the following:    Hgb urine dipstick TRACE (*)    All other components within normal limits  URINE CULTURE    Results for orders placed during the hospital encounter of 06/10/13  POCT URINALYSIS DIP (DEVICE)      Result Value Ref Range   Glucose, UA NEGATIVE  NEGATIVE mg/dL   Bilirubin Urine NEGATIVE  NEGATIVE   Ketones, ur NEGATIVE  NEGATIVE mg/dL   Specific Gravity, Urine 1.015  1.005 - 1.030   Hgb urine dipstick TRACE (*) NEGATIVE   pH 7.0  5.0 - 8.0   Protein, ur NEGATIVE  NEGATIVE mg/dL   Urobilinogen, UA 0.2  0.0 - 1.0 mg/dL   Nitrite NEGATIVE  NEGATIVE   Leukocytes, UA NEGATIVE  NEGATIVE   Imaging Review No results found.   MDM   1. UTI (lower urinary tract infection)   Pt's UA not concerning for UTI but pt feels strongly she has a UTI. Is leaving tomorrow for [place] in [place] and is worried she will get worse while out of town.  Urine culture sent. Rx keflex 500mg  QID #20. Pt to f/u if sx do not resolve.      Cathlyn ParsonsAngela M Cary Lothrop, NP 06/10/13 1336

## 2013-06-10 NOTE — Discharge Instructions (Signed)
Urinary Tract Infection  Urinary tract infections (UTIs) can develop anywhere along your urinary tract. Your urinary tract is your body's drainage system for removing wastes and extra water. Your urinary tract includes two kidneys, two ureters, a bladder, and a urethra. Your kidneys are a pair of bean-shaped organs. Each kidney is about the size of your fist. They are located below your ribs, one on each side of your spine.  CAUSES  Infections are caused by microbes, which are microscopic organisms, including fungi, viruses, and bacteria. These organisms are so small that they can only be seen through a microscope. Bacteria are the microbes that most commonly cause UTIs.  SYMPTOMS   Symptoms of UTIs may vary by age and gender of the patient and by the location of the infection. Symptoms in young women typically include a frequent and intense urge to urinate and a painful, burning feeling in the bladder or urethra during urination. Older women and men are more likely to be tired, shaky, and weak and have muscle aches and abdominal pain. A fever may mean the infection is in your kidneys. Other symptoms of a kidney infection include pain in your back or sides below the ribs, nausea, and vomiting.  DIAGNOSIS  To diagnose a UTI, your caregiver will ask you about your symptoms. Your caregiver also will ask to provide a urine sample. The urine sample will be tested for bacteria and white blood cells. White blood cells are made by your body to help fight infection.  TREATMENT   Typically, UTIs can be treated with medication. Because most UTIs are caused by a bacterial infection, they usually can be treated with the use of antibiotics. The choice of antibiotic and length of treatment depend on your symptoms and the type of bacteria causing your infection.  HOME CARE INSTRUCTIONS   If you were prescribed antibiotics, take them exactly as your caregiver instructs you. Finish the medication even if you feel better after you  have only taken some of the medication.   Drink enough water and fluids to keep your urine clear or pale yellow.   Avoid caffeine, tea, and carbonated beverages. They tend to irritate your bladder.   Empty your bladder often. Avoid holding urine for long periods of time.   Empty your bladder before and after sexual intercourse.   After a bowel movement, women should cleanse from front to back. Use each tissue only once.  SEEK MEDICAL CARE IF:    You have back pain.   You develop a fever.   Your symptoms do not begin to resolve within 3 days.  SEEK IMMEDIATE MEDICAL CARE IF:    You have severe back pain or lower abdominal pain.   You develop chills.   You have nausea or vomiting.   You have continued burning or discomfort with urination.  MAKE SURE YOU:    Understand these instructions.   Will watch your condition.   Will get help right away if you are not doing well or get worse.  Document Released: 11/21/2004 Document Revised: 08/13/2011 Document Reviewed: 03/22/2011  [employer] Patient Information 2014 [employer].

## 2013-06-11 LAB — URINE CULTURE
Colony Count: NO GROWTH
Culture: NO GROWTH

## 2013-06-11 NOTE — ED Provider Notes (Signed)
Medical screening examination/treatment/procedure(s) were performed by a resident physician or non-physician practitioner and as the supervising physician I was immediately available for consultation/collaboration.  Clementeen GrahamEvan Kimiyah Blick, MD    Rodolph BongEvan S Dayten Juba, MD 06/11/13 [phone]

## 2013-11-18 ENCOUNTER — Encounter (HOSPITAL_BASED_OUTPATIENT_CLINIC_OR_DEPARTMENT_OTHER): Payer: Self-pay | Admitting: Emergency Medicine

## 2013-11-18 ENCOUNTER — Emergency Department (HOSPITAL_BASED_OUTPATIENT_CLINIC_OR_DEPARTMENT_OTHER)
Admission: EM | Admit: 2013-11-18 | Discharge: 2013-11-18 | Disposition: A | Discharge status: Home / Self Care | Payer: BC Managed Care – PPO | Attending: Emergency Medicine | Admitting: Emergency Medicine

## 2013-11-18 DIAGNOSIS — Z7982 Long term (current) use of aspirin: Secondary | ICD-10-CM | POA: Diagnosis not present

## 2013-11-18 DIAGNOSIS — Z792 Long term (current) use of antibiotics: Secondary | ICD-10-CM | POA: Insufficient documentation

## 2013-11-18 DIAGNOSIS — Z79899 Other long term (current) drug therapy: Secondary | ICD-10-CM | POA: Insufficient documentation

## 2013-11-18 DIAGNOSIS — Y9389 Activity, other specified: Secondary | ICD-10-CM | POA: Insufficient documentation

## 2013-11-18 DIAGNOSIS — W57XXXA Bitten or stung by nonvenomous insect and other nonvenomous arthropods, initial encounter: Secondary | ICD-10-CM | POA: Diagnosis present

## 2013-11-18 DIAGNOSIS — Z8719 Personal history of other diseases of the digestive system: Secondary | ICD-10-CM | POA: Diagnosis not present

## 2013-11-18 DIAGNOSIS — Z87891 Personal history of nicotine dependence: Secondary | ICD-10-CM | POA: Diagnosis not present

## 2013-11-18 DIAGNOSIS — I10 Essential (primary) hypertension: Secondary | ICD-10-CM | POA: Insufficient documentation

## 2013-11-18 DIAGNOSIS — M129 Arthropathy, unspecified: Secondary | ICD-10-CM | POA: Diagnosis not present

## 2013-11-18 DIAGNOSIS — Y9289 Other specified places as the place of occurrence of the external cause: Secondary | ICD-10-CM | POA: Diagnosis not present

## 2013-11-18 DIAGNOSIS — S90569A Insect bite (nonvenomous), unspecified ankle, initial encounter: Secondary | ICD-10-CM | POA: Insufficient documentation

## 2013-11-18 DIAGNOSIS — S80862A Insect bite (nonvenomous), left lower leg, initial encounter: Secondary | ICD-10-CM

## 2013-11-18 DIAGNOSIS — IMO0002 Reserved for concepts with insufficient information to code with codable children: Secondary | ICD-10-CM | POA: Diagnosis not present

## 2013-11-18 NOTE — Discharge Instructions (Signed)
Apply warm compresses to the area intermittently. Take benadryl every 8 hours.  Insect Bite Mosquitoes, flies, fleas, bedbugs, and many other insects can bite. Insect bites are different from insect stings. A sting is when venom is injected into the skin. Some insect bites can transmit infectious diseases. SYMPTOMS  Insect bites usually turn red, swell, and itch for 2 to 4 days. They often go away on their own. TREATMENT  Your caregiver may prescribe antibiotic medicines if a bacterial infection develops in the bite. HOME CARE INSTRUCTIONS  Do not scratch the bite area.  Keep the bite area clean and dry. Wash the bite area thoroughly with soap and water.  Put ice or cool compresses on the bite area.  Put ice in a plastic bag.  Place a towel between your skin and the bag.  Leave the ice on for 20 minutes, 4 times a day for the first 2 to 3 days, or as directed.  You may apply a baking soda paste, cortisone cream, or calamine lotion to the bite area as directed by your caregiver. This can help reduce itching and swelling.  Only take over-the-counter or prescription medicines as directed by your caregiver.  If you are given antibiotics, take them as directed. Finish them even if you start to feel better. You may need a tetanus shot if:  You cannot remember when you had your last tetanus shot.  You have never had a tetanus shot.  The injury broke your skin. If you get a tetanus shot, your arm may swell, get red, and feel warm to the touch. This is common and not a problem. If you need a tetanus shot and you choose not to have one, there is a rare chance of getting tetanus. Sickness from tetanus can be serious. SEEK IMMEDIATE MEDICAL CARE IF:   You have increased pain, redness, or swelling in the bite area.  You see a red line on the skin coming from the bite.  You have a fever.  You have joint pain.  You have a headache or neck pain.  You have unusual weakness.  You have  a rash.  You have chest pain or shortness of breath.  You have abdominal pain, nausea, or vomiting.  You feel unusually tired or sleepy. MAKE SURE YOU:   Understand these instructions.  Will watch your condition.  Will get help right away if you are not doing well or get worse. Document Released: 03/21/2004 Document Revised: 05/06/2011 Document Reviewed: 09/12/2010 [facility] Patient Information 2015 [place], [place]. This information is not intended to replace advice given to you by your health care provider. Make sure you discuss any questions you have with your health care provider.

## 2013-11-18 NOTE — ED Provider Notes (Signed)
Medical screening examination/treatment/procedure(s) were performed by non-physician practitioner and as supervising physician I was immediately available for consultation/collaboration.   EKG Interpretation None       Gaynor Ferreras, MD 11/18/13 2340 

## 2013-11-18 NOTE — ED Notes (Signed)
Possible insect bite to her left lower leg a coupe of days ago. Now the area is red, hot, painful and swollen around the site.

## 2013-11-18 NOTE — ED Provider Notes (Signed)
CSN: [id number]     Arrival date & time 11/18/13  1408 History   First MD Initiated Contact with Patient 11/18/13 1440     Chief Complaint  Patient presents with  . Insect Bite     (Consider location/radiation/quality/duration/timing/severity/associated sxs/prior Treatment) HPI Comments: Pt is a 58 y/o female who presents to the ED complaining of an insect bite to her left calf occuring 2 days ago. Pt reports she felt something at the back of her leg, however did not see any bug. Over the past 2 days the area has become more painful and larger. She applied topical hydrocortisone with no relief. Denies fever or chills.  The history is provided by the patient.    Past Medical History  Diagnosis Date  . Arthritis   . Hypertension   . Abdominal pain   . Abdominal distension   . Diarrhea   . Nausea   . Biliary colic    Past Surgical History  Procedure Laterality Date  . Cesarean section  1976  . Laparoscopy  1992 (approx)     abdominal  . Cartilage removal  2007    left knee   No family history on file. History  Substance Use Topics  . Smoking status: Former Games developer  . Smokeless tobacco: Never Used  . Alcohol Use: Yes     Comment: occasional 2 per month   OB History   Grav Para Term Preterm Abortions TAB SAB Ect Mult Living                 Review of Systems  Skin: Positive for color change.       +Insect bite.  All other systems reviewed and are negative.     Allergies  Codeine and Ciprofloxacin  Home Medications   Prior to Admission medications   Medication Sig Start Date End Date Taking? Authorizing Provider  acyclovir (ZOVIRAX) 200 MG capsule Take 200 mg by mouth 2 (two) times daily.  01/22/11   Historical Provider, MD  albuterol (PROVENTIL HFA;VENTOLIN HFA) 108 (90 BASE) MCG/ACT inhaler Inhale 2 puffs into the lungs every 6 (six) hours as needed for wheezing. 03/14/12   Tiffany Irine Seal, PA-C  albuterol (PROVENTIL HFA;VENTOLIN HFA) 108 (90 BASE) MCG/ACT  inhaler Inhale 2 puffs into the lungs every 6 (six) hours as needed for wheezing or shortness of breath. 02/21/13   Rodolph Bong, MD  aspirin 325 MG tablet Take 325 mg by mouth every morning.     Historical Provider, MD  azithromycin (ZITHROMAX) 250 MG tablet Take 1 tablet (250 mg total) by mouth daily. Take first 2 tablets together, then 1 every day until finished. 02/23/13   Hope Orlene Och, NP  cephALEXin (KEFLEX) 500 MG capsule Take 1 capsule (500 mg total) by mouth 4 (four) times daily. 06/10/13   Cathlyn Parsons, NP  cholecalciferol (VITAMIN D) 1000 UNITS tablet Take 5,000 Units by mouth 2 (two) times a week.    Historical Provider, MD  guaiFENesin-codeine (ROBITUSSIN AC) 100-10 MG/5ML syrup Take 5 mLs by mouth 3 (three) times daily as needed for cough. 02/23/13   Hope Orlene Och, NP  HYDROcodone-acetaminophen (NORCO/VICODIN) 5-325 MG per tablet Take 0.5 tablets by mouth at bedtime as needed (cough). 02/21/13   Rodolph Bong, MD  ipratropium (ATROVENT) 0.06 % nasal spray Place 2 sprays into both nostrils 4 (four) times daily. 02/21/13   Rodolph Bong, MD  losartan-hydrochlorothiazide (HYZAAR) 100-25 MG per tablet Take 1 tablet by mouth every morning.  Historical Provider, MD  ondansetron (ZOFRAN ODT) 4 MG disintegrating tablet Take 1 tablet (4 mg total) by mouth every 8 (eight) hours as needed for nausea. 03/14/12   Tiffany Irine Seal, PA-C  predniSONE (DELTASONE) 50 MG tablet Take 1 tablet (50 mg total) by mouth daily. 02/21/13   Rodolph Bong, MD  promethazine (PHENERGAN) 25 MG tablet Take 1 tablet (25 mg total) by mouth every 6 (six) hours as needed for nausea. 03/14/12   Tiffany Irine Seal, PA-C   BP 138/83  Pulse 73  Temp(Src) 98 F (36.7 C) (Oral)  Resp 16  Ht  (1.499 m)  Wt 170 lb (77.111 kg)  BMI 34.32 kg/m2  SpO2 98% Physical Exam  Nursing note and vitals reviewed. Constitutional: She is oriented to person, place, and time. She appears well-developed and well-nourished. No distress.    HENT:  Head: Normocephalic and atraumatic.  Mouth/Throat: Oropharynx is clear and moist.  Eyes: Conjunctivae are normal.  Neck: Normal range of motion. Neck supple.  Cardiovascular: Normal rate, regular rhythm and normal heart sounds.   Pulmonary/Chest: Effort normal and breath sounds normal.  Musculoskeletal: Normal range of motion. She exhibits no edema.       Legs: Neurological: She is alert and oriented to person, place, and time.  Skin: Skin is warm and dry. She is not diaphoretic.  Psychiatric: She has a normal mood and affect. Her behavior is normal.    ED Course  Procedures (including critical care time) Labs Review Labs Reviewed - No data to display  Imaging Review No results found.   EKG Interpretation None      MDM   Final diagnoses:  Insect bite of left leg, initial encounter   Pt well appearing and in NAD. AFVSS. No fluctuance or induration for dranage. Advised warm compresses and benadryl. F/u with PCP. Stable for d/c. Return precautions given. Patient states understanding of treatment care plan and is agreeable.  Trevor Mace, PA-C 11/18/13 1523  Trevor Mace, PA-C 11/18/13 [phone]

## 2015-02-23 DIAGNOSIS — I10 Essential (primary) hypertension: Secondary | ICD-10-CM | POA: Insufficient documentation

## 2015-02-23 DIAGNOSIS — R7303 Prediabetes: Secondary | ICD-10-CM | POA: Insufficient documentation

## 2015-02-23 DIAGNOSIS — E66811 Obesity, class 1: Secondary | ICD-10-CM | POA: Insufficient documentation

## 2015-06-19 DIAGNOSIS — H2513 Age-related nuclear cataract, bilateral: Secondary | ICD-10-CM | POA: Diagnosis not present

## 2015-06-19 DIAGNOSIS — H5213 Myopia, bilateral: Secondary | ICD-10-CM | POA: Diagnosis not present

## 2015-07-18 DIAGNOSIS — R7303 Prediabetes: Secondary | ICD-10-CM | POA: Diagnosis not present

## 2015-07-18 DIAGNOSIS — E669 Obesity, unspecified: Secondary | ICD-10-CM | POA: Diagnosis not present

## 2015-07-18 DIAGNOSIS — Z6833 Body mass index (BMI) 33.0-33.9, adult: Secondary | ICD-10-CM | POA: Diagnosis not present

## 2015-07-18 DIAGNOSIS — R635 Abnormal weight gain: Secondary | ICD-10-CM | POA: Diagnosis not present

## 2015-09-05 DIAGNOSIS — E669 Obesity, unspecified: Secondary | ICD-10-CM | POA: Diagnosis not present

## 2015-09-05 DIAGNOSIS — I10 Essential (primary) hypertension: Secondary | ICD-10-CM | POA: Diagnosis not present

## 2015-09-05 DIAGNOSIS — R7303 Prediabetes: Secondary | ICD-10-CM | POA: Diagnosis not present

## 2015-09-05 DIAGNOSIS — Z6831 Body mass index (BMI) 31.0-31.9, adult: Secondary | ICD-10-CM | POA: Diagnosis not present

## 2015-09-07 DIAGNOSIS — I10 Essential (primary) hypertension: Secondary | ICD-10-CM | POA: Diagnosis not present

## 2015-09-07 DIAGNOSIS — G4730 Sleep apnea, unspecified: Secondary | ICD-10-CM | POA: Diagnosis not present

## 2015-09-07 DIAGNOSIS — R7302 Impaired glucose tolerance (oral): Secondary | ICD-10-CM | POA: Diagnosis not present

## 2016-01-24 DIAGNOSIS — R110 Nausea: Secondary | ICD-10-CM | POA: Diagnosis not present

## 2016-01-24 DIAGNOSIS — R531 Weakness: Secondary | ICD-10-CM | POA: Diagnosis not present

## 2016-01-24 DIAGNOSIS — R1030 Lower abdominal pain, unspecified: Secondary | ICD-10-CM | POA: Diagnosis not present

## 2016-01-24 DIAGNOSIS — I10 Essential (primary) hypertension: Secondary | ICD-10-CM | POA: Diagnosis not present

## 2016-01-24 DIAGNOSIS — I959 Hypotension, unspecified: Secondary | ICD-10-CM | POA: Diagnosis not present

## 2016-01-24 DIAGNOSIS — R197 Diarrhea, unspecified: Secondary | ICD-10-CM | POA: Diagnosis not present

## 2016-01-24 DIAGNOSIS — R112 Nausea with vomiting, unspecified: Secondary | ICD-10-CM | POA: Diagnosis not present

## 2016-01-24 DIAGNOSIS — R9431 Abnormal electrocardiogram [ECG] [EKG]: Secondary | ICD-10-CM | POA: Diagnosis not present

## 2016-01-24 DIAGNOSIS — R1084 Generalized abdominal pain: Secondary | ICD-10-CM | POA: Diagnosis not present

## 2016-01-24 DIAGNOSIS — I700 Atherosclerosis of aorta: Secondary | ICD-10-CM | POA: Diagnosis not present

## 2016-01-24 DIAGNOSIS — R079 Chest pain, unspecified: Secondary | ICD-10-CM | POA: Diagnosis not present

## 2016-01-25 DIAGNOSIS — E876 Hypokalemia: Secondary | ICD-10-CM | POA: Diagnosis not present

## 2016-01-25 DIAGNOSIS — R799 Abnormal finding of blood chemistry, unspecified: Secondary | ICD-10-CM | POA: Diagnosis not present

## 2016-01-25 DIAGNOSIS — M130 Polyarthritis, unspecified: Secondary | ICD-10-CM | POA: Diagnosis not present

## 2016-01-25 DIAGNOSIS — R7302 Impaired glucose tolerance (oral): Secondary | ICD-10-CM | POA: Diagnosis not present

## 2016-01-25 DIAGNOSIS — G4730 Sleep apnea, unspecified: Secondary | ICD-10-CM | POA: Diagnosis not present

## 2016-01-30 DIAGNOSIS — G4730 Sleep apnea, unspecified: Secondary | ICD-10-CM | POA: Diagnosis not present

## 2016-01-30 DIAGNOSIS — M130 Polyarthritis, unspecified: Secondary | ICD-10-CM | POA: Diagnosis not present

## 2016-01-30 DIAGNOSIS — E876 Hypokalemia: Secondary | ICD-10-CM | POA: Diagnosis not present

## 2016-01-30 DIAGNOSIS — I10 Essential (primary) hypertension: Secondary | ICD-10-CM | POA: Diagnosis not present

## 2016-04-12 DIAGNOSIS — R200 Anesthesia of skin: Secondary | ICD-10-CM | POA: Diagnosis not present

## 2016-04-30 DIAGNOSIS — I10 Essential (primary) hypertension: Secondary | ICD-10-CM | POA: Diagnosis not present

## 2016-04-30 DIAGNOSIS — R7302 Impaired glucose tolerance (oral): Secondary | ICD-10-CM | POA: Diagnosis not present

## 2016-04-30 DIAGNOSIS — M130 Polyarthritis, unspecified: Secondary | ICD-10-CM | POA: Diagnosis not present

## 2016-04-30 DIAGNOSIS — G4730 Sleep apnea, unspecified: Secondary | ICD-10-CM | POA: Diagnosis not present

## 2016-06-11 DIAGNOSIS — M130 Polyarthritis, unspecified: Secondary | ICD-10-CM | POA: Diagnosis not present

## 2016-06-11 DIAGNOSIS — J45901 Unspecified asthma with (acute) exacerbation: Secondary | ICD-10-CM | POA: Diagnosis not present

## 2016-06-11 DIAGNOSIS — G4730 Sleep apnea, unspecified: Secondary | ICD-10-CM | POA: Diagnosis not present

## 2016-06-11 DIAGNOSIS — T7840XD Allergy, unspecified, subsequent encounter: Secondary | ICD-10-CM | POA: Diagnosis not present

## 2016-06-11 DIAGNOSIS — I10 Essential (primary) hypertension: Secondary | ICD-10-CM | POA: Diagnosis not present

## 2016-06-13 DIAGNOSIS — R05 Cough: Secondary | ICD-10-CM | POA: Diagnosis not present

## 2016-06-13 DIAGNOSIS — J219 Acute bronchiolitis, unspecified: Secondary | ICD-10-CM | POA: Diagnosis not present

## 2016-06-13 DIAGNOSIS — J45901 Unspecified asthma with (acute) exacerbation: Secondary | ICD-10-CM | POA: Diagnosis not present

## 2016-07-03 DIAGNOSIS — I10 Essential (primary) hypertension: Secondary | ICD-10-CM | POA: Diagnosis not present

## 2016-07-03 DIAGNOSIS — M940 Chondrocostal junction syndrome [Tietze]: Secondary | ICD-10-CM | POA: Diagnosis not present

## 2016-07-03 DIAGNOSIS — R7302 Impaired glucose tolerance (oral): Secondary | ICD-10-CM | POA: Diagnosis not present

## 2016-09-11 DIAGNOSIS — M130 Polyarthritis, unspecified: Secondary | ICD-10-CM | POA: Diagnosis not present

## 2016-09-11 DIAGNOSIS — M75101 Unspecified rotator cuff tear or rupture of right shoulder, not specified as traumatic: Secondary | ICD-10-CM | POA: Diagnosis not present

## 2016-09-11 DIAGNOSIS — I10 Essential (primary) hypertension: Secondary | ICD-10-CM | POA: Diagnosis not present

## 2016-09-25 DIAGNOSIS — M67911 Unspecified disorder of synovium and tendon, right shoulder: Secondary | ICD-10-CM | POA: Diagnosis not present

## 2016-10-04 DIAGNOSIS — J219 Acute bronchiolitis, unspecified: Secondary | ICD-10-CM | POA: Diagnosis not present

## 2016-10-07 ENCOUNTER — Other Ambulatory Visit: Payer: Self-pay | Admitting: Family Medicine

## 2016-10-07 ENCOUNTER — Ambulatory Visit
Admission: RE | Admit: 2016-10-07 | Discharge: 2016-10-07 | Disposition: A | Discharge status: Home / Self Care | Payer: BLUE CROSS/BLUE SHIELD | Source: Ambulatory Visit | Attending: Family Medicine | Admitting: Family Medicine

## 2016-10-07 DIAGNOSIS — J189 Pneumonia, unspecified organism: Secondary | ICD-10-CM

## 2016-10-07 DIAGNOSIS — R05 Cough: Secondary | ICD-10-CM | POA: Diagnosis not present

## 2016-10-10 DIAGNOSIS — J45901 Unspecified asthma with (acute) exacerbation: Secondary | ICD-10-CM | POA: Diagnosis not present

## 2016-10-10 DIAGNOSIS — I10 Essential (primary) hypertension: Secondary | ICD-10-CM | POA: Diagnosis not present

## 2016-11-05 DIAGNOSIS — I10 Essential (primary) hypertension: Secondary | ICD-10-CM | POA: Diagnosis not present

## 2016-11-05 DIAGNOSIS — N289 Disorder of kidney and ureter, unspecified: Secondary | ICD-10-CM | POA: Diagnosis not present

## 2016-11-05 DIAGNOSIS — M130 Polyarthritis, unspecified: Secondary | ICD-10-CM | POA: Diagnosis not present

## 2016-11-05 DIAGNOSIS — E6609 Other obesity due to excess calories: Secondary | ICD-10-CM | POA: Diagnosis not present

## 2016-12-06 ENCOUNTER — Ambulatory Visit
Admission: RE | Admit: 2016-12-06 | Discharge: 2016-12-06 | Disposition: A | Discharge status: Home / Self Care | Payer: BLUE CROSS/BLUE SHIELD | Source: Ambulatory Visit | Attending: Otolaryngology | Admitting: Otolaryngology

## 2016-12-06 ENCOUNTER — Other Ambulatory Visit: Payer: Self-pay | Admitting: Otolaryngology

## 2016-12-06 DIAGNOSIS — R062 Wheezing: Secondary | ICD-10-CM | POA: Diagnosis not present

## 2016-12-06 DIAGNOSIS — J3489 Other specified disorders of nose and nasal sinuses: Secondary | ICD-10-CM | POA: Diagnosis not present

## 2016-12-06 DIAGNOSIS — R059 Cough, unspecified: Secondary | ICD-10-CM

## 2016-12-06 DIAGNOSIS — R042 Hemoptysis: Secondary | ICD-10-CM

## 2016-12-06 DIAGNOSIS — R05 Cough: Secondary | ICD-10-CM | POA: Diagnosis not present

## 2016-12-06 DIAGNOSIS — R490 Dysphonia: Secondary | ICD-10-CM | POA: Diagnosis not present

## 2016-12-26 ENCOUNTER — Ambulatory Visit (INDEPENDENT_AMBULATORY_CARE_PROVIDER_SITE_OTHER): Payer: BLUE CROSS/BLUE SHIELD | Admitting: Internal Medicine

## 2016-12-26 ENCOUNTER — Encounter: Payer: Self-pay | Admitting: Internal Medicine

## 2016-12-26 VITALS — BP 132/84 | HR 78 | Ht 59.0 in | Wt 171.4 lb

## 2016-12-26 DIAGNOSIS — J45991 Cough variant asthma: Secondary | ICD-10-CM | POA: Diagnosis not present

## 2016-12-26 MED ORDER — BUDESONIDE-FORMOTEROL FUMARATE 80-4.5 MCG/ACT IN AERO: 2.0000 | INHALATION_SPRAY | Freq: Two times a day (BID) | RESPIRATORY_TRACT | 0 refills | Status: DC

## 2016-12-26 MED ORDER — BUDESONIDE-FORMOTEROL FUMARATE 80-4.5 MCG/ACT IN AERO: 2.0000 | INHALATION_SPRAY | Freq: Two times a day (BID) | RESPIRATORY_TRACT | 11 refills | Status: DC

## 2016-12-26 NOTE — Assessment & Plan Note (Addendum)
12/26/2016  After extensive coaching HFA effectiveness =    90% > rec symbicort 80 2bid then return for full pfts   The most common causes of chronic cough in immunocompetent adults include the following: upper airway cough syndrome (UACS), previously referred to as postnasal drip syndrome (PNDS), which is caused by variety of rhinosinus conditions; (2) asthma; (3) GERD; (4) chronic bronchitis from cigarette smoking or other inhaled environmental irritants; (5) nonasthmatic eosinophilic bronchitis; and (6) bronchiectasis.   These conditions, singly or in combination, have accounted for up to 94% of the causes of chronic cough in prospective studies.   Other conditions have constituted no >6% of the causes in prospective studies These have included bronchogenic carcinoma, chronic interstitial pneumonia, sarcoidosis, left ventricular failure, ACEI-induced cough, and aspiration from a condition associated with pharyngeal dysfunction.    Chronic cough is often simultaneously caused by more than one condition. A single cause has been found from 38 to 82% of the time, multiple causes from 18 to 62%. Multiply caused cough has been the result of three diseases up to 42% of the time.       Most likely this is cough variant asthma ? Assoc with gerd or rhinitis so start with low dose ics/laba then return in 4 weeks with full pfts and in meantime:  Of the three most common causes of  Sub-acute or recurrent or chronic cough, only one (GERD)  can actually contribute to/ trigger  the other two (asthma and post nasal drip syndrome)  and perpetuate the cylce of cough.  While not intuitively obvious, many patients with chronic low grade reflux do not cough until there is a primary insult that disturbs the protective epithelial barrier and exposes sensitive nerve endings.   This is typically viral but can be direct physical injury such as with an endotracheal tube.   The point is that once this occurs, it is difficult  to eliminate the cycle  using anything but a maximally effective acid suppression regimen at least in the short run, accompanied by an appropriate diet to address non acid GERD and control with zantac in high doses as long as actively coughing, then try off   Total time devoted to counseling  > 50 % of initial 60 min office visit:  review case with pt/ discussion of options/alternatives/ personally creating written customized instructions  in presence of pt  then going over those specific  Instructions directly with the pt including how to use all of the meds but in particular covering each new medication in detail and the difference between the maintenance= "automatic" meds and the prns using an action plan format for the latter (If this problem/symptom => do that organization reading Left to right).  Please see AVS from this visit for a full list of these instructions which I personally wrote for this pt and  are unique to this visit.

## 2016-12-26 NOTE — Progress Notes (Signed)
Subjective:     Patient ID: Sue Mitchell, female   DOB: 08/02/1955,    MRN: [id number]  HPI  [age] yobf RT quit smoking 2011 @ wt 140  with h/o sinus symptoms assoc with cough and need for saba prn as teenager while living in [place] but after arrival here at age late [age]'s it changed to point where bothered her mostly with onset of cold weather typically req ov rx  abx and prednisone help a lot and this patterned even after quit smoking to where the episodes last longer and onset of this not directed linked to cold weather in fact in fact had one April 2018 never  Completely cleared  then flared again in Oct 01 2016 severe dry cough / eval by allergist /ent since onset neg w/u    12/26/2016 1st [facility] office visit/ [facility]   Chief Complaint  Patient presents with  . Pulm Consult    Pt referred by Dr. Billy FischerAmanda Marcellino ENT. Pt has productive cough-clear mucus sometimes yellow. Pt had horseness in voice for 6-8 weeks, no chills or fever, have some chest pain and tightness with cough.  recurrent cough since April 2018 never resolved p rx as uri then flaired early August 2018 rx zpak/pred/saba> some better  Cough was worse at hs now better then  Am feel's wheezy not really coughing much up but what she does is white and < 1 tbsp Cough seems worse with exp to cold air at work  Assoc overt hb better on zantac  Some gen ant chest discomfort with severe coughing fits    No obvious day to day or daytime variability or assoc excess/ purulent sputum or mucus plugs or hemoptysis or   subjective wheeze or overt sinus   symptoms. No unusual exp hx or h/o childhood pna/ asthma or knowledge of premature birth.  Sleeping ok now  flat without nocturnal   exacerbation  of respiratory  c/o's or need for noct saba. Also denies any obvious fluctuation of symptoms with weather or environmental changes or other aggravating or alleviating factors except as outlined above   Current Allergies, Complete Past  Medical History, Past Surgical History, Family History, and Social History were reviewed in [facility] electronic medical record.  ROS  The following are not active complaints unless bolded Hoarseness, sore throat, dysphagia, dental problems, itching, sneezing,  nasal congestion or discharge of excess mucus or purulent secretions, ear ache,   fever, chills, sweats, unintended wt loss or wt gain, classically pleuritic or exertional cp,  orthopnea pnd or leg swelling, presyncope, palpitations, abdominal pain, anorexia, nausea, vomiting, diarrhea  or change in bowel habits or change in bladder habits, change in stools or change in urine, dysuria, hematuria,  rash, arthralgias, visual complaints, headache, numbness, weakness or ataxia or problems with walking or coordination,  change in mood/affect or memory.        Current Meds - not able to confirm accuracy   Medication Sig  . acyclovir (ZOVIRAX) 200 MG capsule Take 200 mg by mouth 2 (two) times daily.   Marland Kitchen. albuterol (PROVENTIL HFA;VENTOLIN HFA) 108 (90 BASE) MCG/ACT inhaler Inhale 2 puffs into the lungs every 6 (six) hours as needed for wheezing.  Marland Kitchen. albuterol (PROVENTIL HFA;VENTOLIN HFA) 108 (90 BASE) MCG/ACT inhaler Inhale 2 puffs into the lungs every 6 (six) hours as needed for wheezing or shortness of breath.  .          .    . cholecalciferol (VITAMIN D) 1000  UNITS tablet Take 5,000 Units by mouth 2 (two) times a week.  Marland Kitchen guaiFENesin-codeine (ROBITUSSIN AC) 100-10 MG/5ML syrup Take 5 mLs by mouth 3 (three) times daily as needed for cough.  Marland Kitchen HYDROcodone-acetaminophen (NORCO/VICODIN) 5-325 MG per tablet Take 0.5 tablets by mouth at bedtime as needed (cough).  Marland Kitchen ipratropium (ATROVENT) 0.06 % nasal spray Place 2 sprays into both nostrils 4 (four) times daily.  . ondansetron (ZOFRAN ODT) 4 MG disintegrating tablet Take 1 tablet (4 mg total) by mouth every 8 (eight) hours as needed for nausea.  . promethazine (PHENERGAN) 25 MG tablet Take 1  tablet (25 mg total) by mouth every 6 (six) hours as needed for nausea.  . [DISCONTINUED] losartan-hydrochlorothiazide (HYZAAR) 100-25 MG per tablet Take 1 tablet by mouth every morning.  . [DISCONTINUED] predniSONE (DELTASONE) 50 MG tablet Take 1 tablet (50 mg total) by mouth daily.             Review of Systems     Objective:   Physical Exam    amb obese bf nad  Wt Readings from Last 3 Encounters:  12/26/16 171 lb 6.4 oz (77.7 kg)  11/18/13 170 lb (77.1 kg)  03/21/11 171 lb (77.6 kg)    Vital signs reviewed  - Note on arrival 02 sats   97% on  RA   HEENT: nl dentition, turbinates bilaterally, and oropharynx. Nl external ear canals without cough reflex   NECK :  without JVD/Nodes/TM/ nl carotid upstrokes bilaterally   LUNGS: no acc muscle use,  Nl contour chest with cough at mid exp but minimal exp rhonchi bilaterally    CV:  RRR  no s3 or murmur or increase in P2, and no edema   ABD:  soft and nontender with nl inspiratory excursion in the supine position. No bruits or organomegaly appreciated, bowel sounds nl  MS:  Nl gait/ ext warm without deformities, calf tenderness, cyanosis or clubbing No obvious joint restrictions   SKIN: warm and dry without lesions    NEURO:  alert, approp, nl sensorium with  no motor or cerebellar deficits apparent.     . I personally reviewed images and agree with radiology impression as follows:  CXR:   12/06/16 No active cardiopulmonary disease.         Assessment:

## 2016-12-26 NOTE — Patient Instructions (Addendum)
Plan A = Automatic =  symbicort 80 Take 2 puffs first thing in am and then another 2 puffs about 12 hours later.      Plan B = Backup Only use your albuterol as a rescue medication to be used if you can't catch your breath by resting or doing a relaxed purse lip breathing pattern.  - The less you use it, the better it will work when you need it. - Ok to use the inhaler up to 2 puffs  every 4 hours if you must but call for appointment if use goes up over your usual need - Don't leave home without it !!  (think of it like the spare tire for your car)    Start zantac 150- 300 mg after bfast and after supper until cough better   GERD (REFLUX)  is an extremely common cause of respiratory symptoms just like yours , many times with no obvious heartburn at all.    It can be treated with medication, but also with lifestyle changes including elevation of the head of your bed (ideally with 6 inch  bed blocks),  Smoking cessation, avoidance of late meals, excessive alcohol, and avoid fatty foods, chocolate, peppermint, colas, red wine, and acidic juices such as orange juice.  NO MINT OR MENTHOL PRODUCTS SO NO COUGH DROPS  USE SUGARLESS CANDY INSTEAD (Jolley ranchers or Stover's or Life Savers) or even ice chips will also do - the key is to swallow to prevent all throat clearing. NO OIL BASED VITAMINS - use powdered substitutes.    Please schedule a follow up office visit in 4 weeks, sooner if needed with full pfts on return

## 2017-02-06 ENCOUNTER — Ambulatory Visit (INDEPENDENT_AMBULATORY_CARE_PROVIDER_SITE_OTHER): Payer: BLUE CROSS/BLUE SHIELD | Admitting: Internal Medicine

## 2017-02-06 ENCOUNTER — Ambulatory Visit: Payer: BLUE CROSS/BLUE SHIELD | Admitting: Internal Medicine

## 2017-02-06 ENCOUNTER — Encounter: Payer: Self-pay | Admitting: Internal Medicine

## 2017-02-06 VITALS — BP 144/80 | HR 74 | Ht 60.0 in | Wt 171.0 lb

## 2017-02-06 DIAGNOSIS — J45991 Cough variant asthma: Secondary | ICD-10-CM

## 2017-02-06 LAB — PULMONARY FUNCTION TEST
DL/VA % pred: 79 %
DL/VA: 3.37 ml/min/mmHg/L
DLCO UNC % PRED: 66 %
DLCO cor % pred: 66 %
DLCO cor: 12.52 ml/min/mmHg
DLCO unc: 12.52 ml/min/mmHg
FEF 25-75 PRE: 0.82 L/s
FEF 25-75 Post: 0.88 L/sec
FEF2575-%Change-Post: 8 %
FEF2575-%Pred-Post: 50 %
FEF2575-%Pred-Pre: 46 %
FEV1-%CHANGE-POST: 5 %
FEV1-%PRED-PRE: 76 %
FEV1-%Pred-Post: 80 %
FEV1-PRE: 1.3 L
FEV1-Post: 1.37 L
FEV1FVC-%Change-Post: -3 %
FEV1FVC-%PRED-PRE: 88 %
FEV6-%Change-Post: 9 %
FEV6-%Pred-Post: 96 %
FEV6-%Pred-Pre: 88 %
FEV6-Post: 2.03 L
FEV6-Pre: 1.86 L
FEV6FVC-%Change-Post: 0 %
FEV6FVC-%PRED-PRE: 103 %
FEV6FVC-%Pred-Post: 103 %
FVC-%Change-Post: 8 %
FVC-%PRED-POST: 92 %
FVC-%Pred-Pre: 85 %
FVC-PRE: 1.87 L
FVC-Post: 2.04 L
POST FEV6/FVC RATIO: 100 %
PRE FEV1/FVC RATIO: 70 %
PRE FEV6/FVC RATIO: 100 %
Post FEV1/FVC ratio: 67 %
RV % PRED: 119 %
RV: 2.16 L
TLC % PRED: 101 %
TLC: 4.53 L

## 2017-02-06 MED ORDER — PANTOPRAZOLE SODIUM 40 MG PO TBEC: 40.0000 mg | DELAYED_RELEASE_TABLET | Freq: Every day | ORAL | 2 refills | Status: DC

## 2017-02-06 NOTE — Patient Instructions (Addendum)
Pantoprazole (protonix) 40 mg   Take  30-60 min before first meal of the day and Zantac  300 mg bedtime until return to office - this is the best way to tell whether stomach acid is contributing to your problem.     GERD (REFLUX)  is an extremely common cause of respiratory symptoms just like yours , many times with no obvious heartburn at all.    It can be treated with medication, but also with lifestyle changes including elevation of the head of your bed (ideally with 6 inch  bed blocks),  Smoking cessation, avoidance of late meals, excessive alcohol, and avoid fatty foods, chocolate, peppermint, colas, red wine, and acidic juices such as orange juice.  NO MINT OR MENTHOL PRODUCTS SO NO COUGH DROPS  USE SUGARLESS CANDY INSTEAD (Jolley ranchers or Stover's or Life Savers) or even ice chips will also do - the key is to swallow to prevent all throat clearing. NO OIL BASED VITAMINS - use powdered substitutes if at all possible      Please schedule a follow up office visit in 6 weeks, call sooner if needed

## 2017-02-06 NOTE — Progress Notes (Signed)
PFT done today. 

## 2017-02-06 NOTE — Progress Notes (Signed)
Subjective:    Patient ID: Sue Mitchell, female   DOB: 02/10/1956,    MRN: [id number]   Brief patient profile:  [age] yobf  RT quit smoking 2011 @ wt 140  with h/o sinus symptoms assoc with cough and need for saba prn as teenager while living in [place] but after arrival here at age late [age]'s it changed to point where bothered her mostly with onset of cold weather typically req ov rx  abx and prednisone help a lot and this patterned even after quit smoking to where the episodes last longer and onset of this not directed linked to cold weather in fact in fact had one April 2018 never  Completely cleared  then flared again in Oct 01 2016 severe dry cough / eval by allergist /ent since onset neg w/u     History of Present Illness  12/26/2016 1st [facility] office visit/ Sue Mitchell   Chief Complaint  Patient presents with  . Pulm Consult    Pt referred by Dr. Billy FischerAmanda Marcellino ENT. Pt has productive cough-clear mucus sometimes yellow. Pt had horseness in voice for 6-8 weeks, no chills or fever, have some chest pain and tightness with cough.  recurrent cough since April 2018 never resolved p rx as uri then flaired early August 2018 rx zpak/pred/saba> some better  Cough was worse at hs now better then  Am feel's wheezy not really coughing much up but what she does is white and < 1 tbsp Cough seems worse with exp to cold air at work  Assoc overt hb better on zantac  Some gen ant chest discomfort with severe coughing fits  rec Plan A = Automatic =  symbicort 80 Take 2 puffs first thing in am and then another 2 puffs about 12 hours later.  Plan B = Backup Only use your albuterol as a rescue medication  Start zantac 150- 300 mg after bfast and after supper until cough better  GERD diet    02/06/2017  f/u ov/Sue Mitchell re:  GOLD I copd/ cough on cold air exp  Chief Complaint  Patient presents with  . Follow-up    Cough had improved but then worsened again with colder weather. She is using her  albuterol inhaler 2 x per wk on average.   not limited by doe / steps ok except for knees  Some gerd at hs taking h2 am only   No obvious day to day or daytime variability or assoc excess/ purulent sputum or mucus plugs or hemoptysis or cp or chest tightness, subjective wheeze . No unusual exposure hx or h/o childhood pna/ asthma or knowledge of premature birth.  Sleeping ok flat without nocturnal  or early am exacerbation  of respiratory  c/o's or need for noct saba. Also denies any obvious fluctuation of symptoms with weather or environmental changes or other aggravating or alleviating factors except as outlined above   Current Allergies, Complete Past Medical History, Past Surgical History, Family History, and Social History were reviewed in [facility] electronic medical record.  ROS  The following are not active complaints unless bolded Hoarseness, sore throat, dysphagia, dental problems, itching, sneezing,  nasal congestion or discharge of excess mucus or purulent secretions, ear ache,   fever, chills, sweats, unintended wt loss or wt gain, classically pleuritic or exertional cp,  orthopnea pnd or leg swelling, presyncope, palpitations, abdominal pain, anorexia, nausea, vomiting, diarrhea  or change in bowel habits or change in bladder habits, change in stools or change in urine,  dysuria, hematuria,  rash, arthralgias, visual complaints, headache, numbness, weakness or ataxia or problems with walking or coordination,  change in mood/affect or memory.        Current Meds  Medication Sig  . acyclovir (ZOVIRAX) 200 MG capsule Take 200 mg by mouth 2 (two) times daily.   Marland Kitchen. albuterol (PROVENTIL HFA;VENTOLIN HFA) 108 (90 BASE) MCG/ACT inhaler Inhale 2 puffs into the lungs every 6 (six) hours as needed for wheezing or shortness of breath.  . budesonide-formoterol (SYMBICORT) 80-4.5 MCG/ACT inhaler Inhale 2 puffs into the lungs every 12 (twelve) hours.  . cholecalciferol (VITAMIN D) 1000 UNITS  tablet Take 5,000 Units by mouth 2 (two) times a week.  . DOCOSAHEXAENOIC ACID PO Take 1 g by mouth daily.  . irbesartan-hydrochlorothiazide (AVALIDE) 300-12.5 MG tablet Take 0.5 tablets by mouth daily.  . [DISCONTINUED] azithromycin (ZITHROMAX) 250 MG tablet Take 1 tablet (250 mg total) by mouth daily. Take first 2 tablets together, then 1 every day until finished.  . [DISCONTINUED] guaiFENesin-codeine (ROBITUSSIN AC) 100-10 MG/5ML syrup Take 5 mLs by mouth 3 (three) times daily as needed for cough.  . [DISCONTINUED] ondansetron (ZOFRAN ODT) 4 MG disintegrating tablet Take 1 tablet (4 mg total) by mouth every 8 (eight) hours as needed for nausea.  . [DISCONTINUED] promethazine (PHENERGAN) 25 MG tablet Take 1 tablet (25 mg total) by mouth every 6 (six) hours as needed for nausea.                      Objective:   Physical Exam    Pleasant amb bf nad   02/06/2017     171   12/26/16 171 lb 6.4 oz (77.7 kg)  11/18/13 170 lb (77.1 kg)  03/21/11 171 lb (77.6 kg)    Vital signs reviewed - Note on arrival 02 sats  98% on RA         HEENT: nl   turbinates bilaterally, and oropharynx. Nl external ear canals without cough reflex - poor dentition    NECK :  without JVD/Nodes/TM/ nl carotid upstrokes bilaterally   LUNGS: no acc muscle use,  Nl contour chest which is m1.34 clear to A and P bilaterally without cough on insp or exp maneuvers   CV:  RRR  no s3 or murmur or increase in P2, and no edema   ABD:  soft and nontender with nl inspiratory excursion in the supine position. No bruits or organomegaly appreciated, bowel sounds nl  MS:  Nl gait/ ext warm without deformities, calf tenderness, cyanosis or clubbing No obvious joint restrictions   SKIN: warm and dry without lesions    NEURO:  alert, approp, nl sensorium with  no motor or cerebellar deficits apparent.         . I personally reviewed images and agree with radiology impression as follows:  CXR:    12/06/16 No active cardiopulmonary disease.         Assessment:

## 2017-02-07 ENCOUNTER — Encounter: Payer: Self-pay | Admitting: Internal Medicine

## 2017-02-07 NOTE — Assessment & Plan Note (Signed)
12/26/2016  After extensive coaching HFA effectiveness =    90% > rec symbicort 80 2bid then return for full pfts - PFT's  02/06/2017  FEV1 1.37 (80 % ) ratio 67  p 5 % improvement from saba p symb 80  prior to study with DLCO  66/66c % corrects to 79  % for alv volume    Doing fine now except for over HB which could be contributing to some of her symptoms so rec rx for gerd and f/u in 6 weeks  Each maintenance medication was reviewed in detail including most importantly the difference between maintenance and as needed and under what circumstances the prns are to be used.  Please see AVS for specific  Instructions which are unique to this visit and I personally typed out  which were reviewed in detail in writing with the patient and a copy provided.

## 2017-03-20 ENCOUNTER — Ambulatory Visit: Payer: BLUE CROSS/BLUE SHIELD | Admitting: Internal Medicine

## 2017-04-28 DIAGNOSIS — J069 Acute upper respiratory infection, unspecified: Secondary | ICD-10-CM | POA: Diagnosis not present

## 2017-04-28 DIAGNOSIS — I10 Essential (primary) hypertension: Secondary | ICD-10-CM | POA: Diagnosis not present

## 2017-07-01 ENCOUNTER — Telehealth: Payer: Self-pay | Admitting: Internal Medicine

## 2017-07-01 NOTE — Telephone Encounter (Signed)
PA request received from [facility] CMM Key: [facility] PA request has been sent to plan, and a determination is expected within 24-72 HOURS.   Routing to LMR for follow-up.

## 2017-07-03 NOTE — Telephone Encounter (Signed)
  Approved on May 7  CaseId:[id number];Status:Approved;Review Type:Prior Auth;Coverage Start Date:06/01/2017;Coverage End Date:06/30/2020;  Pharmacy aware and stated that prescription was ready for pick up.  Patient was called and left message about approval.  Will route to [facility]

## 2017-09-10 DIAGNOSIS — I10 Essential (primary) hypertension: Secondary | ICD-10-CM | POA: Diagnosis not present

## 2017-09-10 DIAGNOSIS — A6004 Herpesviral vulvovaginitis: Secondary | ICD-10-CM | POA: Diagnosis not present

## 2017-09-10 DIAGNOSIS — E6609 Other obesity due to excess calories: Secondary | ICD-10-CM | POA: Diagnosis not present

## 2017-09-10 DIAGNOSIS — R05 Cough: Secondary | ICD-10-CM | POA: Diagnosis not present

## 2017-09-19 DIAGNOSIS — R05 Cough: Secondary | ICD-10-CM | POA: Diagnosis not present

## 2017-09-19 DIAGNOSIS — J219 Acute bronchiolitis, unspecified: Secondary | ICD-10-CM | POA: Diagnosis not present

## 2017-09-19 DIAGNOSIS — J45901 Unspecified asthma with (acute) exacerbation: Secondary | ICD-10-CM | POA: Diagnosis not present

## 2017-09-25 DIAGNOSIS — J219 Acute bronchiolitis, unspecified: Secondary | ICD-10-CM | POA: Diagnosis not present

## 2017-09-25 DIAGNOSIS — S80861A Insect bite (nonvenomous), right lower leg, initial encounter: Secondary | ICD-10-CM | POA: Diagnosis not present

## 2017-10-21 ENCOUNTER — Ambulatory Visit: Payer: BLUE CROSS/BLUE SHIELD | Admitting: Internal Medicine

## 2017-10-21 ENCOUNTER — Encounter: Payer: Self-pay | Admitting: Internal Medicine

## 2017-10-21 VITALS — BP 120/76 | HR 71 | Ht 60.0 in | Wt 169.0 lb

## 2017-10-21 DIAGNOSIS — J45991 Cough variant asthma: Secondary | ICD-10-CM

## 2017-10-21 LAB — NITRIC OXIDE: Nitric Oxide: 7

## 2017-10-21 MED ORDER — PREDNISONE 10 MG PO TABS
ORAL_TABLET | ORAL | 0 refills | Status: DC
Start: 2017-10-21 — End: 2017-11-04

## 2017-10-21 MED ORDER — BUDESONIDE-FORMOTEROL FUMARATE 80-4.5 MCG/ACT IN AERO: 2.0000 | INHALATION_SPRAY | Freq: Two times a day (BID) | RESPIRATORY_TRACT | 0 refills | Status: DC

## 2017-10-21 MED ORDER — PANTOPRAZOLE SODIUM 40 MG PO TBEC: 40.0000 mg | DELAYED_RELEASE_TABLET | Freq: Every day | ORAL | 2 refills | Status: DC

## 2017-10-21 NOTE — Patient Instructions (Addendum)
Plan A = Automatic = symbiocort 80 Take 2 puffs first thing in am and then another 2 puffs about 12 hours later.    Plan B = Backup Only use your albuterol as a rescue medication to be used if you can't catch your breath by resting or doing a relaxed purse lip breathing pattern.  - The less you use it, the better it will work when you need it. - Ok to use the inhaler up to 2 puffs  every 4 hours if you must but call for appointment if use goes up over your usual need - Don't leave home without it !!  (think of it like the spare tire for your car)    Prednisone 10 mg take  4 each am x 2 days,   2 each am x 2 days,  1 each am x 2 days and stop    Pantoprazole (protonix) 40 mg   Take  30-60 min before first meal of the day and ranitidine 300 mg each evening   GERD (REFLUX)  is an extremely common cause of respiratory symptoms just like yours , many times with no obvious heartburn at all.    It can be treated with medication, but also with lifestyle changes including elevation of the head of your bed (ideally with 6 inch  bed blocks),  Smoking cessation, avoidance of late meals, excessive alcohol, and avoid fatty foods, chocolate, peppermint, colas, red wine, and acidic juices such as orange juice.  NO MINT OR MENTHOL PRODUCTS SO NO COUGH DROPS  USE SUGARLESS CANDY INSTEAD (Jolley ranchers or Stover's or Life Savers) or even ice chips will also do - the key is to swallow to prevent all throat clearing. NO OIL BASED VITAMINS - use powdered substitutes.     Please schedule a follow up office visit in 2 weeks, sooner if needed  with all medications /inhalers/ solutions in hand so we can verify exactly what you are taking. This includes all medications from all doctors and over the counters

## 2017-10-21 NOTE — Progress Notes (Signed)
Subjective:    Patient ID: Sue Mitchell, female   DOB: 01/09/1956,    MRN: [id number]   Brief patient profile:  37 yobf  RT quit smoking 2011 @ wt 140  with h/o sinus symptoms assoc with cough and need for saba prn as teenager while living in [place] but after arrival here at age late 53's it changed to point where bothered her mostly with onset of cold weather typically req ov rx  abx and prednisone help a lot and this patterned even after quit smoking to where the episodes last longer and onset of this not directed linked to cold weather in fact in fact had one April 2018 never  Completely cleared  then flared again in Oct 01 2016 severe dry cough / eval by allergist /ent since onset neg w/u     History of Present Illness  12/26/2016 1st [facility] office visit/ Sue Mitchell   Chief Complaint  Patient presents with  . Pulm Consult    Pt referred by Dr. Billy Fischer ENT. Pt has productive cough-clear mucus sometimes yellow. Pt had horseness in voice for 6-8 weeks, no chills or fever, have some chest pain and tightness with cough.  recurrent cough since April 2018 never resolved p rx as uri then flaired early August 2018 rx zpak/pred/saba> some better  Cough was worse at hs now better then  Am feel's wheezy not really coughing much up but what she does is white and < 1 tbsp Cough seems worse with exp to cold air at work  Assoc overt hb better on zantac  Some gen ant chest discomfort with severe coughing fits  rec Plan A = Automatic =  symbicort 80 Take 2 puffs first thing in am and then another 2 puffs about 12 hours later.  Plan B = Backup Only use your albuterol as a rescue medication  Start zantac 150- 300 mg after bfast and after supper until cough better  GERD diet    02/06/2017  f/u ov/Sue Mitchell re:  GOLD I copd/ cough on cold air exp  Chief Complaint  Patient presents with  . Follow-up    Cough had improved but then worsened again with colder weather. She is using her  albuterol inhaler 2 x per wk on average.   not limited by doe / steps ok except for knees  Some gerd at hs taking h2 am only  rec Pantoprazole (protonix) 40 mg   Take  30-60 min before first meal of the day and Zantac  300 mg bedtime until return to office - this is the best way to tell whether stomach acid is contributing to your problem.   GERD diet  Please schedule a follow up office visit in 6 weeks, call sooner if needed     10/21/2017  f/u ov/Sue Mitchell re: acute refractory cough / saw allergist cannot name "all neg"  ? [name]  Chief Complaint  Patient presents with  . Follow-up    Last seen by [name] 02/06/17. Patient states she has had a productive cough, post nasal drip, and constantly using her voice. At night, she becomes so congested that she has to breathe through her mouth instead of nose.    from last ov until 09/25/17 fine on on ranitidine one bid/symb 80 2bid Never took protonix  Was still needing saba twice a week at baseline and a lot more while working at [employer]  Then abruptly  worse Aug 1 lots sinus drainage clear > zpak early in  Aug by [name] some better for a week or two then worse says ran out of symbicort one day prior to ov / using lots of saba now and severe nasal congestion as well as hoarseness and can't sleep due to cough   No obvious day to day or daytime variability or assoc   purulent sputum or mucus plugs or hemoptysis or cp or chest tightness, subjective wheeze or overt sinus or hb symptoms.    . Also denies any obvious fluctuation of symptoms with weather or environmental changes or other aggravating or alleviating factors except as outlined above   No unusual exposure hx or h/o childhood pna/ asthma or knowledge of premature birth.  Current Allergies, Complete Past Medical History, Past Surgical History, Family History, and Social History were reviewed in [facility] electronic medical record.  ROS  The following are not active complaints unless  bolded Hoarseness, sore throat, dysphagia, dental problems, itching, sneezing,  nasal congestion or discharge of excess mucus or purulent secretions, ear ache,   fever, chills, sweats, unintended wt loss or wt gain, classically pleuritic or exertional cp,  orthopnea pnd or arm/hand swelling  or leg swelling, presyncope, palpitations, abdominal pain, anorexia, nausea, vomiting, diarrhea  or change in bowel habits or change in bladder habits, change in stools or change in urine, dysuria, hematuria,  rash, arthralgias, visual complaints, headache, numbness, weakness or ataxia or problems with walking or coordination,  change in mood or  memory.        Current Meds  Medication Sig  . acyclovir (ZOVIRAX) 200 MG capsule Take 200 mg by mouth 2 (two) times daily.   Marland Kitchen. albuterol (PROVENTIL HFA;VENTOLIN HFA) 108 (90 BASE) MCG/ACT inhaler Inhale 2 puffs into the lungs every 6 (six) hours as needed for wheezing or shortness of breath.  . budesonide-formoterol (SYMBICORT) 80-4.5 MCG/ACT inhaler Inhale 2 puffs into the lungs every 12 (twelve) hours.  . cholecalciferol (VITAMIN D) 1000 UNITS tablet Take 5,000 Units by mouth 2 (two) times a week.  . DOCOSAHEXAENOIC ACID PO Take 1 g by mouth daily.  . irbesartan-hydrochlorothiazide (AVALIDE) 300-12.5 MG tablet Take 0.5 tablets by mouth daily.  .     .                              Objective:   Physical Exam    Hoarse amb bf harsh upper airway pattern coughing   10/21/2017       169  02/06/2017     171   12/26/16 171 lb 6.4 oz (77.7 kg)  11/18/13 170 lb (77.1 kg)  03/21/11 171 lb (77.6 kg)     Vital signs reviewed - Note on arrival 02 sats 96% on RA      HEENT: Poor dentition/ nl  turbinates bilaterally, and oropharynx. Nl external ear canals without cough reflex   NECK :  without JVD/Nodes/TM/ nl carotid upstrokes bilaterally   LUNGS: no acc muscle use,  Nl contour chest with prominent upper and lower airway wheeze/ much better with  plm  CV:  RRR  no s3 or murmur or increase in P2, and no edema   ABD:  soft and nontender with nl inspiratory excursion in the supine position. No bruits or organomegaly appreciated, bowel sounds nl  MS:  Nl gait/ ext warm without deformities, calf tenderness, cyanosis or clubbing No obvious joint restrictions   SKIN: warm and dry without lesions    NEURO:  alert, approp, nl sensorium with  no motor or cerebellar deficits apparent.                   Assessment:

## 2017-10-22 ENCOUNTER — Ambulatory Visit: Payer: BLUE CROSS/BLUE SHIELD | Admitting: Internal Medicine

## 2017-10-22 ENCOUNTER — Encounter: Payer: Self-pay | Admitting: Internal Medicine

## 2017-10-22 NOTE — Assessment & Plan Note (Addendum)
12/26/2016  After extensive coaching HFA effectiveness =    90% > rec symbicort 80 2bid then return for full pfts - PFT's  02/06/2017  FEV1 1.37 (80 % ) ratio 67  p 5 % improvement from saba p symb 80  prior to study with DLCO  66/66c % corrects to 79  % for alv volume     - Spirometry 10/21/2017  FEV1 1.18 (70%)  Ratio 71 with min curvature during attack of cough/ mostly pseudowheeze on exam  - FENO 10/21/2017  =   7  - - The proper method of use, as well as anticipated side effects, of a metered-dose inhaler are discussed and demonstrated to the patient. Improved effectiveness after extensive coaching during this visit to a level of approximately 75 % from a baseline of 50 %  > says reason is uses spacer at home but note does not use spacer with hfa for emergencies     DDX of  difficult airways management almost all start with A and  include Adherence, Ace Inhibitors, Acid Reflux, Active Sinus Disease, Alpha 1 Antitripsin deficiency, Anxiety masquerading as Airways dz,  ABPA,  Allergy(esp in young), Aspiration (esp in elderly), Adverse effects of meds,  Active smokers, A bunch of PE's (a small clot burden can't cause this syndrome unless there is already severe underlying pulm or vascular dz with poor reserve) plus two Bs  = Bronchiectasis and Beta blocker use..and one C= CHF   Adherence is always the initial "prime suspect" and is a multilayered concern that requires a "trust but verify" approach in every patient - starting with knowing how to use medications, especially inhalers, correctly, keeping up with refills and understanding the fundamental difference between maintenance and prns vs those medications only taken for a very short course and then stopped and not refilled.  - see hfa teaching - ok to use spacer but need to verify she uses it correctly - return with all meds in hand using a trust but verify approach to confirm accurate Medication  Reconciliation The principal here is that until we  are certain that the  patients are doing what we've asked, it makes no sense to ask them to do more.    ? Acid (or non-acid) GERD > always difficult to exclude as up to 75% of pts in some series report no assoc GI/ Heartburn symptoms> rec max (24h)  acid suppression and diet restrictions/ reviewed and instructions given in writing.    ? Anxiety > usually at the bottom of this list of usual suspects but should be much higher on this pt's based on H and P and   may interfere with adherence and also interpretation of response or lack thereof to symptom management which can be quite subjective.   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 Active smoking > denies since 2011   ? Allergy > check records/ doubt allergic asthma with feno so low > no change low dose symb as pseudowheeze such a prominent feature/  rx acutely with Prednisone 10 mg take  4 each am x 2 days,   2 each am x 2 days,  1 each am x 2 days and stop   ? Active sinus dz > sinus ct on return if nasal congestion not better on rx     I had an  extended discussion with the patient reviewing all relevant studies completed to date and  lasting 25 minutes of a 40  minute acute office visit to re-establish with me     re  severe non-specific but potentially very serious refractory respiratory symptoms of uncertain and potentially multiple  etiologies.   Each maintenance medication was reviewed in detail including most importantly the difference between maintenance and prns and under what circumstances the prns are to be triggered using an action plan format that is not reflected in the computer generated alphabetically organized AVS.    Please see AVS for specific instructions unique to this office visit that I personally wrote and verbalized to the the pt in detail and then reviewed with pt  by my nurse highlighting any  changes in therapy/plan of care  recommended at today's visit.

## 2017-11-04 ENCOUNTER — Ambulatory Visit: Payer: BLUE CROSS/BLUE SHIELD | Admitting: Internal Medicine

## 2017-11-04 ENCOUNTER — Encounter: Payer: Self-pay | Admitting: Internal Medicine

## 2017-11-04 ENCOUNTER — Ambulatory Visit (INDEPENDENT_AMBULATORY_CARE_PROVIDER_SITE_OTHER): Payer: BLUE CROSS/BLUE SHIELD | Admitting: Internal Medicine

## 2017-11-04 VITALS — BP 120/68 | HR 87 | Ht <= 58 in | Wt 169.0 lb

## 2017-11-04 DIAGNOSIS — J45991 Cough variant asthma: Secondary | ICD-10-CM

## 2017-11-04 DIAGNOSIS — Z23 Encounter for immunization: Secondary | ICD-10-CM

## 2017-11-04 MED ORDER — MONTELUKAST SODIUM 10 MG PO TABS: ORAL_TABLET | ORAL | 2 refills | Status: DC

## 2017-11-04 NOTE — Patient Instructions (Addendum)
Continue symbicort 80 Take 2 puffs first thing in am and then another 2 puffs about 12 hours later thru spacer  Add singulair 10 mg each pm   Please schedule a follow up office visit in 4 weeks, sooner if needed with pfts on return

## 2017-11-04 NOTE — Progress Notes (Signed)
Subjective:    Patient ID: Sue Mitchell, female   DOB: [birth year],    MRN: [id number]   Brief patient profile:  46 yobf  RT quit smoking 2011 @ wt 140  with h/o sinus symptoms assoc with cough and need for saba prn as teenager while living in [place] but after arrival here at age late 44's it changed to point where bothered her mostly with onset of cold weather typically req ov rx  abx and prednisone help a lot and this patterned even after quit smoking to where the episodes last longer and onset of this not directed linked to cold weather in fact in fact had one April 2018 never  Completely cleared  then flared again in Oct 01 2016 severe dry cough / eval by allergist /ent since onset neg w/u     History of Present Illness  12/26/2016 1st [facility] office visit/ Sue Mitchell   Chief Complaint  Patient presents with  . Pulm Consult    Pt referred by Dr. Billy Fischer ENT. Pt has productive cough-clear mucus sometimes yellow. Pt had horseness in voice for 6-8 weeks, no chills or fever, have some chest pain and tightness with cough.  recurrent cough since April 2018 never resolved p rx as uri then flaired early August 2018 rx zpak/pred/saba> some better  Cough was worse at hs now better then  Am feel's wheezy not really coughing much up but what she does is white and < 1 tbsp Cough seems worse with exp to cold air at work  Assoc overt hb better on zantac  Some gen ant chest discomfort with severe coughing fits  rec Plan A = Automatic =  symbicort 80 Take 2 puffs first thing in am and then another 2 puffs about 12 hours later.  Plan B = Backup Only use your albuterol as a rescue medication  Start zantac 150- 300 mg after bfast and after supper until cough better  GERD diet    02/06/2017  f/u ov/Raj Landress re:  GOLD I copd/ cough on cold air exp  Chief Complaint  Patient presents with  . Follow-up    Cough had improved but then worsened again with colder weather. She is using her  albuterol inhaler 2 x per wk on average.   not limited by doe / steps ok except for knees  Some gerd at hs taking h2 am only  rec Pantoprazole (protonix) 40 mg   Take  30-60 min before first meal of the day and Zantac  300 mg bedtime until return to office - this is the best way to tell whether stomach acid is contributing to your problem.   GERD diet  Please schedule a follow up office visit in 6 weeks, call sooner if needed     10/21/2017  f/u ov/Sue Mitchell re: acute refractory cough / saw allergist cannot name "all neg"  ? Sue Mitchell?  Chief Complaint  Patient presents with  . Follow-up    Last seen by [facility] 02/06/17. Patient states she has had a productive cough, post nasal drip, and constantly using her voice. At night, she becomes so congested that she has to breathe through her mouth instead of nose.    from last ov until 09/25/17 fine on on ranitidine one bid/symb 80 2bid Never took protonix  Was still needing saba twice a week at baseline and a lot more while working at [employer]  Then abruptly  worse Aug 1 lots sinus drainage clear > zpak early in  Aug by Sue Mitchell some better for a week or two then worse says ran out of symbicort one day prior to ov / using lots of saba now and severe nasal congestion as well as hoarseness and can't sleep due to cough rec Plan A = Automatic = symbiocort 80 Take 2 puffs first thing in am and then another 2 puffs about 12 hours later.  Plan B = Backup Only use your albuterol as a rescue medication  Prednisone 10 mg take  4 each am x 2 days,   2 each am x 2 days,  1 each am x 2 days and stop  Pantoprazole (protonix) 40 mg   Take  30-60 min before first meal of the day and ranitidine 300 mg each evening GERD diet Please schedule a follow up office visit in 2 weeks, sooner if needed  with all medications /inhalers/ solutions in hand so we can verify exactly what you are taking. This includes all medications from all doctors and over the counters    11/04/2017  f/u  ov/Sue Mitchell re: atypical asthma in RT  Chief Complaint  Patient presents with  . Follow-up    Pt c/o sneezing, wheezing and cough with clear sputum- started after she mowed her lawn 1 day ago. She has had to use her albuterol inhaler.   wakes up feeling good most am's s cough/ wheeze about 30 min later takes first 2 pffs of symb 80 around 5 15 am  Then next 2 pffs after 9 pm  Had been doing great prior mowing grass one day prior to OV Rarely need saba since last flare    Not limited by breathing from desired activities    No obvious day to day or daytime variability or assoc truly excess/ purulent sputum or mucus plugs or hemoptysis or cp or chest tightness,   overt sinus or hb symptoms.   Sleeping fine flat / one pillow  without nocturnal  or early am exacerbation  of respiratory  c/o's or need for noct saba. Also denies any obvious fluctuation of symptoms with weather or environmental changes or other aggravating or alleviating factors except as outlined above   No unusual exposure hx or h/o childhood pna/ asthma or knowledge of premature birth.  Current Allergies, Complete Past Medical History, Past Surgical History, Family History, and Social History were reviewed in [employer] [facility] record.  ROS  The following are not active complaints unless bolded Hoarseness, sore throat, dysphagia, dental problems, itching, sneezing,  nasal congestion or discharge of excess mucus or purulent secretions, ear ache,   fever, chills, sweats, unintended wt loss or wt gain, classically pleuritic or exertional cp,  orthopnea pnd or arm/hand swelling  or leg swelling, presyncope, palpitations, abdominal pain, anorexia, nausea, vomiting, diarrhea  or change in bowel habits or change in bladder habits, change in stools or change in urine, dysuria, hematuria,  rash, arthralgias, visual complaints, headache, numbness, weakness or ataxia or problems with walking or coordination,  change in mood or   memory.        Current Meds  Medication Sig  . acyclovir (ZOVIRAX) 200 MG capsule Take 200 mg by mouth 2 (two) times daily as needed.   Marland Kitchen albuterol (PROVENTIL HFA;VENTOLIN HFA) 108 (90 BASE) MCG/ACT inhaler Inhale 2 puffs into the lungs every 6 (six) hours as needed for wheezing or shortness of breath.  . budesonide-formoterol (SYMBICORT) 80-4.5 MCG/ACT inhaler Inhale 2 puffs into the lungs every 12 (twelve) hours.  Marland Kitchen  cholecalciferol (VITAMIN D) 1000 UNITS tablet Take 5,000 Units by mouth 2 (two) times a week.  . DOCOSAHEXAENOIC ACID PO Take 1 g by mouth daily.  . irbesartan-hydrochlorothiazide (AVALIDE) 300-12.5 MG tablet Take 0.5 tablets by mouth daily.  . pantoprazole (PROTONIX) 40 MG tablet Take 1 tablet (40 mg total) by mouth daily. Take 30-60 min before first meal of the day  . ranitidine (ZANTAC) 300 MG tablet Take 300 mg by mouth at bedtime.                              Objective:   Physical Exam    Still hoarse amb bf with less prominent psuedowheezing   11/04/2017       169  10/21/2017       169  02/06/2017     171   12/26/16 171 lb 6.4 oz (77.7 kg)  11/18/13 170 lb (77.1 kg)  03/21/11 171 lb (77.6 kg)     Vital signs reviewed - Note on arrival 02 sats  96% on RA      HEENT: nl   turbinates bilaterally, and oropharynx. Nl external ear canals without cough reflex/ poor dentition    NECK :  without JVD/Nodes/TM/ nl carotid upstrokes bilaterally   LUNGS: no acc muscle use,  Nl contour chest which is clear to A and P bilaterally without cough on insp or exp maneuvers   CV:  RRR  no s3 or murmur or increase in P2, and no edema   ABD:  soft and nontender with nl inspiratory excursion in the supine position. No bruits or organomegaly appreciated, bowel sounds nl  MS:  Nl gait/ ext warm without deformities, calf tenderness, cyanosis or clubbing No obvious joint restrictions   SKIN: warm and dry without lesions    NEURO:  alert, approp, nl sensorium  with  no motor or cerebellar deficits apparent.                    Assessment:

## 2017-11-04 NOTE — Assessment & Plan Note (Addendum)
12/26/2016  After extensive coaching HFA effectiveness =    90% > rec symbicort 80 2bid then return for full pfts - PFT's  02/06/2017  FEV1 1.37 (80 % ) ratio 67  p 5 % improvement from saba p symb 80  prior to study with DLCO  66/66c % corrects to 79  % for alv volume   - Spirometry 10/21/2017  FEV1 1.18 (70%)  Ratio 71 with min curvature during attack of cough/ mostly pseudowheeze on exam on symb 80 x2  - FENO 10/21/2017  =   7  On symb 80 x 2   - 11/04/2017  After extensive coaching inhaler device,  effectiveness =    90% with spacer  - singulair added 11/04/2017   Despite active upper airway wheezeing and poor timing on the symbiocrt 80, All goals of chronic asthma control met including optimal function and elimination of symptoms with minimal need for rescue therapy.  Contingencies discussed in full including contacting this office immediately if not controlling the symptoms using the rule of two's.     Will add singulair for the upper airway component and return for pfts with f/v loop, consider ent eval next but continue max rx directed at gerd for now and no change just the  Low dose symb as higher doses likely to aggravate the uacs component   I had an extended discussion with the patient reviewing all relevant studies completed to date and  lasting 15 to 20 minutes of a 25 minute visit    See device teaching which extended face to face time for this visit.  Each maintenance medication was reviewed in detail including emphasizing most importantly the difference between maintenance and prns and under what circumstances the prns are to be triggered using an action plan format that is not reflected in the computer generated alphabetically organized AVS which I have not found useful in most complex patients, especially with respiratory illnesses  Please see AVS for specific instructions unique to this visit that I personally wrote and verbalized to the the pt in detail and then reviewed with pt   by my nurse highlighting any  changes in therapy recommended at today's visit to their plan of care.

## 2017-11-05 ENCOUNTER — Encounter: Payer: Self-pay | Admitting: Internal Medicine

## 2017-12-04 ENCOUNTER — Ambulatory Visit (INDEPENDENT_AMBULATORY_CARE_PROVIDER_SITE_OTHER): Payer: BLUE CROSS/BLUE SHIELD | Admitting: Internal Medicine

## 2017-12-04 ENCOUNTER — Other Ambulatory Visit: Payer: Self-pay | Admitting: Internal Medicine

## 2017-12-04 ENCOUNTER — Other Ambulatory Visit (INDEPENDENT_AMBULATORY_CARE_PROVIDER_SITE_OTHER): Payer: BLUE CROSS/BLUE SHIELD

## 2017-12-04 ENCOUNTER — Encounter: Payer: Self-pay | Admitting: Internal Medicine

## 2017-12-04 VITALS — BP 140/70 | HR 72 | Ht 58.5 in | Wt 170.0 lb

## 2017-12-04 DIAGNOSIS — J45991 Cough variant asthma: Secondary | ICD-10-CM

## 2017-12-04 DIAGNOSIS — J310 Chronic rhinitis: Secondary | ICD-10-CM | POA: Diagnosis not present

## 2017-12-04 LAB — PULMONARY FUNCTION TEST
DL/VA % pred: 80 %
DL/VA: 3.21 ml/min/mmHg/L
DLCO UNC % PRED: 71 %
DLCO unc: 11.96 ml/min/mmHg
FEF 25-75 Post: 1.14 L/sec
FEF 25-75 Pre: 0.68 L/sec
FEF2575-%Change-Post: 65 %
FEF2575-%PRED-POST: 70 %
FEF2575-%PRED-PRE: 42 %
FEV1-%CHANGE-POST: 20 %
FEV1-%Pred-Post: 85 %
FEV1-%Pred-Pre: 70 %
FEV1-POST: 1.34 L
FEV1-PRE: 1.11 L
FEV1FVC-%Change-Post: 1 %
FEV1FVC-%Pred-Pre: 84 %
FEV6-%Change-Post: 18 %
FEV6-%PRED-POST: 103 %
FEV6-%Pred-Pre: 87 %
FEV6-Post: 1.98 L
FEV6-Pre: 1.67 L
FEV6FVC-%Change-Post: 0 %
FEV6FVC-%Pred-Post: 103 %
FEV6FVC-%Pred-Pre: 104 %
FVC-%Change-Post: 18 %
FVC-%PRED-POST: 98 %
FVC-%Pred-Pre: 83 %
FVC-Post: 1.99 L
FVC-Pre: 1.67 L
PRE FEV6/FVC RATIO: 100 %
Post FEV1/FVC ratio: 67 %
Post FEV6/FVC ratio: 100 %
Pre FEV1/FVC ratio: 67 %
RV % pred: 147 %
RV: 2.6 L
TLC % pred: 109 %
TLC: 4.63 L

## 2017-12-04 LAB — CBC WITH DIFFERENTIAL/PLATELET
BASOS ABS: 0 10*3/uL (ref 0.0–0.1)
Basophils Relative: 0.4 % (ref 0.0–3.0)
Eosinophils Absolute: 0 10*3/uL (ref 0.0–0.7)
Eosinophils Relative: 0.5 % (ref 0.0–5.0)
HCT: 41.3 % (ref 36.0–46.0)
HEMOGLOBIN: 13.5 g/dL (ref 12.0–15.0)
LYMPHS PCT: 40.6 % (ref 12.0–46.0)
Lymphs Abs: 3.1 10*3/uL (ref 0.7–4.0)
MCHC: 32.7 g/dL (ref 30.0–36.0)
MCV: 91.8 fl (ref 78.0–100.0)
MONO ABS: 0.7 10*3/uL (ref 0.1–1.0)
Monocytes Relative: 9.4 % (ref 3.0–12.0)
Neutro Abs: 3.8 10*3/uL (ref 1.4–7.7)
Neutrophils Relative %: 49.1 % (ref 43.0–77.0)
PLATELETS: 197 10*3/uL (ref 150.0–400.0)
RBC: 4.5 Mil/uL (ref 3.87–5.11)
RDW: 14.5 % (ref 11.5–15.5)
WBC: 7.7 10*3/uL (ref 4.0–10.5)

## 2017-12-04 NOTE — Patient Instructions (Addendum)
Plan A = Automatic = symbicort 80 Take 2 puffs first thing in am and then another 2 puffs about 12 hours later and remember to breath it out through your nose  singulair  10 mg every evening   Plan B = Backup Only use your albuterol as a rescue medication to be used if you can't catch your breath by resting or doing a relaxed purse lip breathing pattern.  - The less you use it, the better it will work when you need it. - Ok to use the inhaler up to 2 puffs  every 4 hours if you must but call for appointment if use goes up over your usual need - Don't leave home without it !!  (think of it like the spare tire for your car)   For nasal symptoms > zyrtec 10 mg on at bedtime as needed    Please remember to go to the lab department downstairs in the basement  for your tests - we will call you with the results when they are available.        Please schedule a follow up visit in 3 months but call sooner if needed

## 2017-12-04 NOTE — Progress Notes (Signed)
PFT completed today. 12/04/17 

## 2017-12-04 NOTE — Assessment & Plan Note (Signed)
12/26/2016  After extensive coaching HFA effectiveness =    90% > rec symbicort 80 2bid then return for full pfts - PFT's  02/06/2017  FEV1 1.37 (80 % ) ratio 67  p 5 % improvement from saba p symb 80  prior to study with DLCO  66/66c % corrects to 79  % for alv volume   - Spirometry 10/21/2017  FEV1 1.18 (70%)  Ratio 71 with min curvature during attack of cough/ mostly pseudowheeze on exam on symb 80 x2  - FENO 10/21/2017  =   7  On symb 80 x 2  - 11/04/2017  After extensive coaching inhaler device,  effectiveness =    90% with spacer   - singulair added 11/04/2017  But not taking consistently as of 12/04/2017 > rec restart daily  - PFT's  12/04/2017  FEV1 1.34 (85 % ) ratio 67  p 20 % improvement from saba p nothing prior to study with DLCO  71 % corrects to 80  % for alv volume   - Allergy profile 12/04/2017 >  Eos 0.0 /  IgE pending    All goals of chronic asthma control met including optimal function and elimination of symptoms with minimal need for rescue therapy despite non-adherence with singulair on symb 80 2bid so simply rec add singulair back daily and recheck allergy profile   Contingencies discussed in full including contacting this office immediately if not controlling the symptoms using the rule of two's.

## 2017-12-04 NOTE — Assessment & Plan Note (Signed)
Mild flare >>>  try zyrtec prn pending f/u allergy profile  - if neg could try dymista or its component generic parts      I had an extended discussion with the patient reviewing all relevant studies completed to date and  lasting 15 to 20 minutes of a 25 minute visit    See device teaching which extended face to face time for this visit.  Each maintenance medication was reviewed in detail including emphasizing most importantly the difference between maintenance and prns and under what circumstances the prns are to be triggered using an action plan format that is not reflected in the computer generated alphabetically organized AVS which I have not found useful in most complex patients, especially with respiratory illnesses  Please see AVS for specific instructions unique to this visit that I personally wrote and verbalized to the the pt in detail and then reviewed with pt  by my nurse highlighting any  changes in therapy recommended at today's visit to their plan of care.

## 2017-12-04 NOTE — Progress Notes (Signed)
Subjective:    Patient ID: Sue Mitchell, female   DOB: 08/28/1955     MRN: [id number]   Brief patient profile:  62 yobf  RT quit smoking 2011 @ wt 140  with h/o sinus symptoms assoc with cough and need for saba prn as teenager while living in [place] but after arrival here at age late 75's it changed to point where bothered her mostly with onset of cold weather typically req ov rx  abx and prednisone help a lot and this pattern continued even after quit smoking to where the episodes last longer and onset of this not directed linked to cold weather in fact   had one April 2018 never  Completely cleared  then flared again in Oct 01 2016 severe dry cough / eval by allergist /ent since onset neg w/u so referred by Dr Doran Heater to pulmonary clinic 12/26/16     History of Present Illness  12/26/2016 1st [facility] office visit/ Sue Mitchell   Chief Complaint  Patient presents with  . Pulm Consult    Pt referred by Dr. Billy Fischer ENT. Pt has productive cough-clear mucus sometimes yellow. Pt had horseness in voice for 6-8 weeks, no chills or fever, have some chest pain and tightness with cough.  recurrent cough since April 2018 never resolved p rx as uri then flaired early August 2018 rx zpak/pred/saba> some better  Cough was worse at hs now   Am feels wheezy not really coughing much up but what she does is white and < 1 tbsp Cough seems worse with exp to cold air at work  Assoc overt hb better on zantac  Some gen ant chest discomfort with severe coughing fits  rec Plan A = Automatic =  symbicort 80 Take 2 puffs first thing in am and then another 2 puffs about 12 hours later.  Plan B = Backup Only use your albuterol as a rescue medication  Start zantac 150- 300 mg after bfast and after supper until cough better  GERD diet    02/06/2017  f/u ov/Sue Mitchell re:  GOLD I copd/ cough on cold air exp  Chief Complaint  Patient presents with  . Follow-up    Cough had improved but then worsened  again with colder weather. She is using her albuterol inhaler 2 x per wk on average.   not limited by doe / steps ok except for knees  Some gerd at hs taking h2 am only  rec Pantoprazole (protonix) 40 mg  Take  30-60 min before first meal of the day and Zantac  300 mg bedtime until return to office - this is the best way to tell whether stomach acid is contributing to your problem.   GERD diet  Please schedule a follow up office visit in 6 weeks, call sooner if needed     10/21/2017  f/u ov/Sue Mitchell re: acute refractory cough / saw allergist cannot name "all neg"  ? [name]  Chief Complaint  Patient presents with  . Follow-up    Last seen by [facility] 02/06/17. Patient states she has had a productive cough, post nasal drip, and constantly using her voice. At night, she becomes so congested that she has to breathe through her mouth instead of nose.    from last ov until 09/25/17 fine on on ranitidine one bid/symb 80 2bid Never took protonix  Was still needing saba twice a week at baseline and a lot more while working at [employer]  Then abruptly  worse Aug  1 lots sinus drainage clear > zpak early in Aug by Ocie Bob some better for a week or two then worse says ran out of symbicort one day prior to ov / using lots of saba now and severe nasal congestion as well as hoarseness and can't sleep due to cough rec Plan A = Automatic = symbiocort 80 Take 2 puffs first thing in am and then another 2 puffs about 12 hours later.  Plan B = Backup Only use your albuterol as a rescue medication  Prednisone 10 mg take  4 each am x 2 days,   2 each am x 2 days,  1 each am x 2 days and stop  Pantoprazole (protonix) 40 mg   Take  30-60 min before first meal of the day and ranitidine 300 mg each evening GERD diet      11/04/2017  f/u ov/Sue Mitchell re: atypical asthma in RT  Chief Complaint  Patient presents with  . Follow-up    Pt c/o sneezing, wheezing and cough with clear sputum- started after she mowed her lawn 1 day  ago. She has had to use her albuterol inhaler.   wakes up feeling good most am's s cough/ wheeze about 30 min later takes first 2 pffs of symb 80 around 5 15 am  Then next 2 pffs after 9 pm  Had been doing great prior mowing grass one day prior to OV Rarely need saba since last flare    Not limited by breathing from desired activities   rec Continue symbicort 80 Take 2 puffs first thing in am and then another 2 puffs about 12 hours later thru spacer Add singulair 10 mg each pm    12/04/2017  f/u ov/Sue Mitchell re:   symb 80 2bid/ singualir sometimes  Chief Complaint  Patient presents with  . Follow-up    PFT today, continues to have cough with hoarseness. She uses her albuterol inhaler 1 x per wk on average.   Dyspnea:  fine Cough: p grass exp / chemicals/ cologne dry Sleeping: flat/ one pillow no noct symptms SABA use: rare 02: none   Flare of cough / drainage 12/01/17 > has not tried otc's   No obvious day to day or daytime variability or assoc excess/ purulent sputum or mucus plugs or hemoptysis or cp or chest tightness, subjective wheeze or overt sinus or hb symptoms.   Sleeps as above  without nocturnal  or early am exacerbation  of respiratory  c/o's or need for noct saba. Also denies any obvious fluctuation of symptoms with weather or environmental changes or other aggravating or alleviating factors except as outlined above   No unusual exposure hx or h/o childhood pna/ asthma or knowledge of premature birth.  Current Allergies, Complete Past Medical History, Past Surgical History, Family History, and Social History were reviewed in [employer] record.  ROS  The following are not active complaints unless bolded Hoarseness, sore throat, dysphagia, dental problems, itching, sneezing,  nasal congestion or discharge of excess mucus or purulent secretions, ear ache,   fever, chills, sweats, unintended wt loss or wt gain, classically pleuritic or exertional cp,   orthopnea pnd or arm/hand swelling  or leg swelling, presyncope, palpitations, abdominal pain, anorexia, nausea, vomiting, diarrhea  or change in bowel habits or change in bladder habits, change in stools or change in urine, dysuria, hematuria,  rash, arthralgias, visual complaints, headache, numbness, weakness or ataxia or problems with walking or coordination,  change in mood or  memory.        Current Meds  Medication Sig  . acyclovir (ZOVIRAX) 200 MG capsule Take 200 mg by mouth 2 (two) times daily as needed.   Marland Kitchen albuterol (PROVENTIL HFA;VENTOLIN HFA) 108 (90 BASE) MCG/ACT inhaler Inhale 2 puffs into the lungs every 6 (six) hours as needed for wheezing or shortness of breath.  . budesonide-formoterol (SYMBICORT) 80-4.5 MCG/ACT inhaler Inhale 2 puffs into the lungs every 12 (twelve) hours.  . cholecalciferol (VITAMIN D) 1000 UNITS tablet Take 5,000 Units by mouth 2 (two) times a week.  . DOCOSAHEXAENOIC ACID PO Take 1 g by mouth daily.  . irbesartan-hydrochlorothiazide (AVALIDE) 300-12.5 MG tablet Take 0.5 tablets by mouth daily.  . montelukast (SINGULAIR) 10 MG tablet One at bedtime every night  . Omega-3 1000 MG CAPS Take by mouth.  . pantoprazole (PROTONIX) 40 MG tablet Take 1 tablet (40 mg total) by mouth daily. Take 30-60 min before first meal of the day  . ranitidine (ZANTAC) 300 MG tablet Take 300 mg by mouth at bedtime.                     Objective:   Physical Exam    amb pleasant bf nad/ minimal pseudowheeze resolves with plm     12/04/2017     170  11/04/2017       169  10/21/2017       169  02/06/2017     171   12/26/16 171 lb 6.4 oz (77.7 kg)  11/18/13 170 lb (77.1 kg)  03/21/11 171 lb (77.6 kg)   Vital signs reviewed - Note on arrival 02 sats  99% on RA        HEENT: nl   turbinates bilaterally, and oropharynx. Nl external ear canals without cough reflex - mild / moderate bilateral non-specific turbinate edema     NECK :  without JVD/Nodes/TM/ nl  carotid upstrokes bilaterally   LUNGS: no acc muscle use,  Nl contour chest which is clear to A and P bilaterally without cough on insp or exp maneuvers   CV:  RRR  no s3 or murmur or increase in P2, and no edema   ABD:  soft and nontender with nl inspiratory excursion in the supine position. No bruits or organomegaly appreciated, bowel sounds nl  MS:  Nl gait/ ext warm without deformities, calf tenderness, cyanosis or clubbing No obvious joint restrictions   SKIN: warm and dry without lesions    NEURO:  alert, approp, nl sensorium with  no motor or cerebellar deficits apparent.             Assessment:

## 2017-12-05 LAB — RESPIRATORY ALLERGY PROFILE REGION II ~~LOC~~
Allergen, Cottonwood, t14: <0.1 kU/L
Allergen, D pternoyssinus,d7: 0.17 kU/L — ABNORMAL HIGH
Allergen, Mouse Urine Protein, e78: <0.1 kU/L
Allergen, Oak,t7: <0.1 kU/L
Allergen, P. notatum, m1: <0.1 kU/L
Bermuda Grass: <0.1 kU/L
Box Elder IgE: <0.1 kU/L
CLADOSPORIUM HERBARUM (M2) IGE: <0.1 kU/L
CLASS: 0
CLASS: 0
CLASS: 0
CLASS: 0
CLASS: 0
CLASS: 0
COMMON RAGWEED (SHORT) (W1) IGE: <0.1 kU/L
Class: 0
Class: 0
Class: 0
Class: 0
Class: 0
Class: 0
Class: 0
Class: 0
Class: 0
Class: 0
Class: 0
Class: 0
Class: 0
Class: 0
Class: 0
Class: 0
Class: 0
Class: 0
Cockroach: <0.1 kU/L
D. farinae: <0.1 kU/L
Dog Dander: <0.1 kU/L
Elm IgE: <0.1 kU/L
IgE (Immunoglobulin E), Serum: 546 kU/L — ABNORMAL HIGH (ref <=114)
Johnson Grass: <0.1 kU/L
Sheep Sorrel IgE: <0.1 kU/L
Timothy Grass: <0.1 kU/L

## 2017-12-05 LAB — INTERPRETATION:

## 2017-12-05 NOTE — Progress Notes (Signed)
LMTCB

## 2017-12-08 ENCOUNTER — Telehealth: Payer: Self-pay | Admitting: Internal Medicine

## 2017-12-08 NOTE — Telephone Encounter (Signed)
Nyoka Cowden, MD sent to Christen Butter, CMA        Call patient : Studies are c/w allergy to dust only    LMTCB

## 2017-12-08 NOTE — Telephone Encounter (Signed)
Patient returned call for results.  Results given.  Patient stated understanding.  Nothing further at this time.

## 2017-12-23 ENCOUNTER — Telehealth: Payer: Self-pay | Admitting: Internal Medicine

## 2017-12-23 MED ORDER — BUDESONIDE-FORMOTEROL FUMARATE 80-4.5 MCG/ACT IN AERO: 2.0000 | INHALATION_SPRAY | Freq: Two times a day (BID) | RESPIRATORY_TRACT | 0 refills | Status: DC

## 2017-12-23 NOTE — Telephone Encounter (Signed)
Spoke with pt, advised her that I would leave a sample of Symbicort and coupon card up front. Pt understood and nothing further is needed.    Patient Instructions by Nyoka Cowden, MD at 12/04/2017 12:00 PM  Author: Nyoka Cowden, MD Author Type: Physician Filed: 12/04/2017 12:28 PM  Note Status: Addendum Cosign: Cosign Not Required Encounter Date: 12/04/2017  Editor: Nyoka Cowden, MD (Physician)  Prior Versions: 1. Nyoka Cowden, MD (Physician) at 12/04/2017 12:27 PM - Signed    Plan A = Automatic = symbicort 80 Take 2 puffs first thing in am and then another 2 puffs about 12 hours later and remember to breath it out through your nose  singulair  10 mg every evening   Plan B = Backup Only use your albuterol as a rescue medication to be used if you can't catch your breath by resting or doing a relaxed purse lip breathing pattern.  - The less you use it, the better it will work when you need it. - Ok to use the inhaler up to 2 puffs  every 4 hours if you must but call for appointment if use goes up over your usual need - Don't leave home without it !!  (think of it like the spare tire for your car)   For nasal symptoms > zyrtec 10 mg on at bedtime as needed    Please remember to go to the lab department downstairs in the basement  for your tests - we will call you with the results when they are available.

## 2018-01-14 ENCOUNTER — Other Ambulatory Visit: Payer: Self-pay | Admitting: Internal Medicine

## 2018-02-05 ENCOUNTER — Other Ambulatory Visit: Payer: Self-pay | Admitting: Internal Medicine

## 2018-02-05 DIAGNOSIS — J45991 Cough variant asthma: Secondary | ICD-10-CM

## 2018-02-19 ENCOUNTER — Ambulatory Visit: Payer: BLUE CROSS/BLUE SHIELD | Admitting: Pulmonary Disease

## 2018-02-19 NOTE — Progress Notes (Signed)
@Patient  ID: Sue Mitchell, female    DOB: June 01, 1955, 62 y.o.   MRN: [id number]  Chief Complaint  Patient presents with  . Acute Visit    Cough    Referring provider: Renaye Rakers, MD  HPI:  61 year old female former smoker followed in our office for cough variant asthma, upper airway cough syndrome, rhinitis  PMH: Vocal cord dysfunction Smoker/ Smoking History: Former smoker Maintenance: Symbicort 80 Pt of: Dr. [name]  02/21/2018  - Visit   62 year old female patient presenting today for an acute visit.  Patient reports she has had worsening coughing with congestion over the last 4 weeks. Pt also endorses increased wheezing with exertion and occasionally when flat at night.  Patient reports that after the weather changed in November cough and congestion has significantly worsened. Pt reporting increased post nasal drip and sinus pressure. Patient with known allergy to dust mites. SABA use 2x a week, which is an increase from before November when pt would use monthly. Pt reports adhearence to Symbicort 80.  Known triggers of cough are smells, cleaning, cologne.   See Cough ROS below in ROS section.    Tests:  12/04/2017- respiratory allergy panel- positive for dust mites, IgE 546 12/04/2017-CBC with differential- eosinophils relative 0.5, eosinophils absolute 0 12/06/2016-chest x-ray-no active cardiopulmonary disease 12/04/2017-pulmonary function test- FVC 1.67 (83% predicted), postbronchodilator ratio 67, FEV1 85, positive bronchodilator response, significant mid flow reversibility after bronchodilator, DLCO 71 >>>Mild obstructive airway disease, mild diffusion defect  FENO:  Lab Results  Component Value Date   NITRICOXIDE 7 10/21/2017    PFT: PFT Results Latest Ref Rng & Units 12/04/2017 02/06/2017  FVC-Pre L 1.67 1.87  FVC-Predicted Pre % 83 85  FVC-Post L 1.99 2.04  FVC-Predicted Post % 98 92  Pre FEV1/FVC % % 67 70  Post FEV1/FCV % % 67 67  FEV1-Pre L 1.11 1.30    FEV1-Predicted Pre % 70 76  FEV1-Post L 1.34 1.37  DLCO UNC% % 71 66  DLCO COR %Predicted % 80 79  TLC L 4.63 4.53  TLC % Predicted % 109 101  RV % Predicted % 147 119    Imaging: No results found.    Specialty Problems      Pulmonary Problems   Cough variant asthma  vs uacs wit vcd    12/26/2016  After extensive coaching HFA effectiveness =    90% > rec symbicort 80 2bid then return for full pfts - PFT's  02/06/2017  FEV1 1.37 (80 % ) ratio 67  p 5 % improvement from saba p symb 80  prior to study with DLCO  66/66c % corrects to 79  % for alv volume   - Spirometry 10/21/2017  FEV1 1.18 (70%)  Ratio 71 with min curvature during attack of cough/ mostly pseudowheeze on exam on symb 80 x2  - FENO 10/21/2017  =   7  On symb 80 x 2  - 11/04/2017  After extensive coaching inhaler device,  effectiveness =    90% with spacer  - singulair added 11/04/2017  But not taking consistently as of 12/04/2017 > rec restart daily  - PFT's  12/04/2017  FEV1 1.34 (85 % ) ratio 67  p 20 % improvement from saba p nothing prior to study with DLCO  71 % corrects to 80  % for alv volume   - Allergy profile 12/04/2017 >  Eos 0.0 /  IgE  546 dust only         Rhinitis,  chronic   Acute maxillary sinusitis      Allergies  Allergen Reactions  . Codeine Hives, Itching and Other (See Comments)    All over the body, including burning sensation.  . Ciprofloxacin Palpitations    Immunization History  Administered Date(s) Administered  . Influenza Split 11/25/2016  . Influenza,inj,Quad PF,6+ Mos 11/04/2017    Past Medical History:  Diagnosis Date  . Abdominal distension   . Abdominal pain   . Arthritis   . Biliary colic   . Diarrhea   . Hypertension   . Nausea     Tobacco History: Social History   Tobacco Use  Smoking Status Former Smoker  . Packs/day: 0.50  . Years: 25.00  . Pack years: 12.50  . Types: Cigarettes  . Last attempt to quit: 2011  . Years since quitting: 8.9  Smokeless  Tobacco Never Used   Counseling given: Yes  Continue to not smoke  Outpatient Encounter Medications as of 02/20/2018  Medication Sig  . acyclovir (ZOVIRAX) 200 MG capsule Take 200 mg by mouth 2 (two) times daily as needed.   Marland Kitchen. albuterol (PROVENTIL HFA;VENTOLIN HFA) 108 (90 BASE) MCG/ACT inhaler Inhale 2 puffs into the lungs every 6 (six) hours as needed for wheezing or shortness of breath.  . cholecalciferol (VITAMIN D) 1000 UNITS tablet Take 5,000 Units by mouth 2 (two) times a week.  . DOCOSAHEXAENOIC ACID PO Take 1 g by mouth daily.  . irbesartan-hydrochlorothiazide (AVALIDE) 300-12.5 MG tablet Take 0.5 tablets by mouth daily.  . montelukast (SINGULAIR) 10 MG tablet One at bedtime every night  . Omega-3 1000 MG CAPS Take by mouth.  . pantoprazole (PROTONIX) 40 MG tablet TAKE 1 TABLET(40 MG) BY MOUTH DAILY 30 TO 60 MINUTES BEFORE FIRST MEAL OF THE DAY  . SYMBICORT 80-4.5 MCG/ACT inhaler INHALE 2 PUFFS EVERY 12 HOURS  . amoxicillin-clavulanate (AUGMENTIN) 875-125 MG tablet Take 1 tablet by mouth 2 (two) times daily.  . predniSONE (DELTASONE) 10 MG tablet 4 tabs for 2 days, then 3 tabs for 2 days, 2 tabs for 2 days, then 1 tab for 2 days, then stop  . ranitidine (ZANTAC) 300 MG tablet Take 300 mg by mouth at bedtime.   No facility-administered encounter medications on file as of 02/20/2018.      Review of Systems  Review of Systems  Constitutional: Negative for chills, fatigue, fever and unexpected weight change.  HENT: Positive for congestion, postnasal drip and voice change. Negative for ear pain, sinus pressure and sinus pain.   Respiratory: Positive for cough and wheezing. Negative for chest tightness and shortness of breath.   Cardiovascular: Negative for chest pain and palpitations.  Gastrointestinal: Negative for diarrhea, nausea and vomiting.  Musculoskeletal: Negative for arthralgias.  Skin: Negative for color change.  Allergic/Immunologic: Positive for environmental  allergies. Negative for food allergies.  Neurological: Negative for dizziness, light-headedness and headaches.  Psychiatric/Behavioral: Negative for dysphoric mood. The patient is not nervous/anxious.   All other systems reviewed and are negative.   Cough ROS:   When to the symptoms start: 3-4 years ago  How are you today: one of the worst weeks   Have you had fever/sore throat (first 5 to 7 days of URI) or Have you had cough/nasal congestion (10 to 14 days of URI) : yes cough, yes nasal congestion Have you used anything to treat the cough, as anything improved : protonix, narcotic cough meds Is it a dry or wet cough: dry, occasionally mucous - clear to white  Does the cough happen when your breathing or when you breathe out: in  Other any triggers to your cough, or any aggravating factors: smoke, cologne, smells, cleaning supplies, dust   Daily antihistamine: none  GERD treatment: protonix, now off ranitidine  Singulair: yes - struggles with adhearence   Cough checklist (bolded indicates presence):  Adherence - struggles with adhearence, acid reflux, ACE inhibitor, active sinus disease, active smoking, adverse effects of medications (amiodarone/Macrodantin/bb), alpha 1, allergies, aspiration, anxiety, bronchiectasis, congestive heart failure (diastolic)     Physical Exam  BP (!) 142/72 (BP Location: Left Arm, Cuff Size: Normal)   Pulse 81   Temp 98 F (36.7 C) (Oral)   Ht 4' 10.5" (1.486 m)   Wt 175 lb 9.6 oz (79.7 kg)   SpO2 97%   BMI 36.08 kg/m   Wt Readings from Last 5 Encounters:  02/20/18 175 lb 9.6 oz (79.7 kg)  12/04/17 170 lb (77.1 kg)  11/04/17 169 lb (76.7 kg)  10/21/17 169 lb (76.7 kg)  02/06/17 171 lb (77.6 kg)     Physical Exam  Constitutional: She is oriented to person, place, and time and well-developed, well-nourished, and in no distress. No distress.  HENT:  Head: Normocephalic and atraumatic.  Right Ear: Hearing, external ear and ear canal normal.   Left Ear: Hearing, external ear and ear canal normal.  Nose: Mucosal edema present. Right sinus exhibits maxillary sinus tenderness. Right sinus exhibits no frontal sinus tenderness. Left sinus exhibits maxillary sinus tenderness and frontal sinus tenderness.  Mouth/Throat: Uvula is midline and oropharynx is clear and moist. No oropharyngeal exudate.  +TMs with effusion without infection bilaterally  +PND +L>R maxillary sinus tenderness  Eyes: Pupils are equal, round, and reactive to light.  Neck: Normal range of motion. Neck supple.  Cardiovascular: Normal rate, regular rhythm and normal heart sounds.  Pulmonary/Chest: Effort normal. No accessory muscle usage. No respiratory distress. She has no decreased breath sounds. She has wheezes (exp wheeze ). She has no rhonchi.  Musculoskeletal: Normal range of motion.        General: No edema.  Neurological: She is alert and oriented to person, place, and time. Gait normal.  Skin: Skin is warm and dry. She is not diaphoretic. No erythema.  Psychiatric: Mood, memory, affect and judgment normal.  Nursing note and vitals reviewed.     Lab Results:  CBC    Component Value Date/Time   WBC 7.7 12/04/2017 1245   RBC 4.50 12/04/2017 1245   HGB 13.5 12/04/2017 1245   HCT 41.3 12/04/2017 1245   PLT 197.0 12/04/2017 1245   MCV 91.8 12/04/2017 1245   MCH 31.9 12/13/2010 1747   MCHC 32.7 12/04/2017 1245   RDW 14.5 12/04/2017 1245   LYMPHSABS 3.1 12/04/2017 1245   MONOABS 0.7 12/04/2017 1245   EOSABS 0.0 12/04/2017 1245   BASOSABS 0.0 12/04/2017 1245    BMET    Component Value Date/Time   NA 140 12/13/2010 1747   K 3.5 12/13/2010 1747   CL 102 12/13/2010 1747   CO2 29 12/13/2010 1747   GLUCOSE 82 12/13/2010 1747   BUN 12 12/13/2010 1747   CREATININE 0.91 12/13/2010 1747   CALCIUM 10.2 12/13/2010 1747   GFRNONAA 70 (L) 12/13/2010 1747   GFRAA 81 (L) 12/13/2010 1747    BNP No results found for: BNP  ProBNP No results found for:  PROBNP    Assessment & Plan:   62 year old female patient completing acute visit with our office today.  Patient with acute sinusitis as well as flare of upper airway cough syndrome/ Cough variant asthma.  Will treat with Augmentin as well as prednisone taper.  We will have patient restart daily antihistamine, restart Singulair daily, start doing nasal saline rinses and start doing Flonase 1 spray each nostril.  All patient keep follow-up with Dr. [name] to ensure symptoms are improving.    Cough variant asthma  vs uacs wit vcd Augmentin >>> Take 1 875-125 mg tablet every 12 hours for the next 7 days >>> Take with food  Prednisone 10mg  tablet  >>>4 tabs for 2 days, then 3 tabs for 2 days, 2 tabs for 2 days, then 1 tab for 2 days, then stop >>>take with food  >>>take in the morning   Please start taking a daily antihistamine:  >>>choose one of: zyrtec, claritin, allegra, or xyzal  >>>these are over the counter medications  >>>can choose generic option  >>>take daily  >>>this medication helps with allergies, post nasal drip, and cough   Restart Singulair daily   Start Nasal saline rinses  Start flonase 1 spray each nostril for 7 days, then can use as need  Continue Symbicort 80 >>> 2 puffs in the morning right when you wake up, rinse out your mouth after use, 12 hours later 2 puffs, rinse after use >>> Take this daily, no matter what >>> This is not a rescue inhaler   Omeprazole 40 mg tablet  >>>Please take 1 tablet daily 15 minutes to 30 minutes before your first meal of the day as well as before your other medications >>>Try to take at the same time each day >>>take this medication daily  GERD management: >>>Avoid laying flat until 2 hours after meals >>>Elevate head of the bed including entire chest >>>Reduce size of meals and amount of fat, acid, spices, caffeine and sweets >>>If you are smoking, Please stop! >>>Decrease alcohol consumption >>>Work on maintaining a  healthy weight with normal BMI   Keep follow up with Dr. [name]   Acute maxillary sinusitis Augmentin >>> Take 1 875-125 mg tablet every 12 hours for the next 7 days >>> Take with food  Prednisone 10mg  tablet  >>>4 tabs for 2 days, then 3 tabs for 2 days, 2 tabs for 2 days, then 1 tab for 2 days, then stop >>>take with food  >>>take in the morning   Please start taking a daily antihistamine:  >>>choose one of: zyrtec, claritin, allegra, or xyzal  >>>these are over the counter medications  >>>can choose generic option  >>>take daily  >>>this medication helps with allergies, post nasal drip, and cough   Restart Singulair daily   Start Nasal saline rinses  Start flonase 1 spray each nostril for 7 days, then can use as need  Keep follow up with Dr. Cindy Hazy, NP 02/21/2018   This appointment was 28 min long with over 50% of the time in direct face-to-face patient care, assessment, plan of care, and follow-up.

## 2018-02-20 ENCOUNTER — Ambulatory Visit: Payer: BLUE CROSS/BLUE SHIELD | Admitting: Pulmonary Disease

## 2018-02-20 ENCOUNTER — Encounter: Payer: Self-pay | Admitting: Pulmonary Disease

## 2018-02-20 VITALS — BP 142/72 | HR 81 | Temp 98.0°F | Ht 58.5 in | Wt 175.6 lb

## 2018-02-20 DIAGNOSIS — J0100 Acute maxillary sinusitis, unspecified: Secondary | ICD-10-CM | POA: Insufficient documentation

## 2018-02-20 DIAGNOSIS — J45991 Cough variant asthma: Secondary | ICD-10-CM

## 2018-02-20 MED ORDER — PREDNISONE 10 MG PO TABS: ORAL_TABLET | ORAL | 0 refills | Status: DC

## 2018-02-20 MED ORDER — AMOXICILLIN-POT CLAVULANATE 875-125 MG PO TABS: 1.0000 | ORAL_TABLET | Freq: Two times a day (BID) | ORAL | 0 refills | Status: DC

## 2018-02-20 NOTE — Patient Instructions (Addendum)
Augmentin >>> Take 1 875-125 mg tablet every 12 hours for the next 7 days >>> Take with food  Prednisone 10mg  tablet  >>>4 tabs for 2 days, then 3 tabs for 2 days, 2 tabs for 2 days, then 1 tab for 2 days, then stop >>>take with food  >>>take in the morning   Please start taking a daily antihistamine:  >>>choose one of: zyrtec, claritin, allegra, or xyzal  >>>these are over the counter medications  >>>can choose generic option  >>>take daily  >>>this medication helps with allergies, post nasal drip, and cough   Restart Singulair daily   Start Nasal saline rinses  Start flonase 1 spray each nostril for 7 days, then can use as need  Continue Symbicort 80 >>> 2 puffs in the morning right when you wake up, rinse out your mouth after use, 12 hours later 2 puffs, rinse after use >>> Take this daily, no matter what >>> This is not a rescue inhaler   Omeprazole 40 mg tablet  >>>Please take 1 tablet daily 15 minutes to 30 minutes before your first meal of the day as well as before your other medications >>>Try to take at the same time each day >>>take this medication daily  GERD management: >>>Avoid laying flat until 2 hours after meals >>>Elevate head of the bed including entire chest >>>Reduce size of meals and amount of fat, acid, spices, caffeine and sweets >>>If you are smoking, Please stop! >>>Decrease alcohol consumption >>>Work on maintaining a healthy weight with normal BMI   Keep follow up with Dr. Sherene SiresWert   It is flu season:   >>>Remember to be washing your hands regularly, using hand sanitizer, be careful to use around herself with has contact with people who are sick will increase her chances of getting sick yourself. >>> Best ways to protect herself from the flu: Receive the yearly flu vaccine, practice good hand hygiene washing with soap and also using hand sanitizer when available, eat a nutritious meals, get adequate rest, hydrate appropriately   Please contact the  office if your symptoms worsen or you have concerns that you are not improving.   Thank you for choosing [facility] for your healthcare, and for allowing us to partner with you on your healthcare journey. I am thankful to be able to provide care to you today.   Elisha HeadlandBrian Sierria Bruney FNP-C

## 2018-02-20 NOTE — Assessment & Plan Note (Signed)
Augmentin >>> Take 1 875-125 mg tablet every 12 hours for the next 7 days >>> Take with food  Prednisone 10mg  tablet  >>>4 tabs for 2 days, then 3 tabs for 2 days, 2 tabs for 2 days, then 1 tab for 2 days, then stop >>>take with food  >>>take in the morning   Please start taking a daily antihistamine:  >>>choose one of: zyrtec, claritin, allegra, or xyzal  >>>these are over the counter medications  >>>can choose generic option  >>>take daily  >>>this medication helps with allergies, post nasal drip, and cough   Restart Singulair daily   Start Nasal saline rinses  Start flonase 1 spray each nostril for 7 days, then can use as need  Continue Symbicort 80 >>> 2 puffs in the morning right when you wake up, rinse out your mouth after use, 12 hours later 2 puffs, rinse after use >>> Take this daily, no matter what >>> This is not a rescue inhaler   Omeprazole 40 mg tablet  >>>Please take 1 tablet daily 15 minutes to 30 minutes before your first meal of the day as well as before your other medications >>>Try to take at the same time each day >>>take this medication daily  GERD management: >>>Avoid laying flat until 2 hours after meals >>>Elevate head of the bed including entire chest >>>Reduce size of meals and amount of fat, acid, spices, caffeine and sweets >>>If you are smoking, Please stop! >>>Decrease alcohol consumption >>>Work on maintaining a healthy weight with normal BMI   Keep follow up with Dr. Sherene SiresWert

## 2018-02-20 NOTE — Assessment & Plan Note (Signed)
Augmentin >>> Take 1 875-125 mg tablet every 12 hours for the next 7 days >>> Take with food  Prednisone 10mg  tablet  >>>4 tabs for 2 days, then 3 tabs for 2 days, 2 tabs for 2 days, then 1 tab for 2 days, then stop >>>take with food  >>>take in the morning   Please start taking a daily antihistamine:  >>>choose one of: zyrtec, claritin, allegra, or xyzal  >>>these are over the counter medications  >>>can choose generic option  >>>take daily  >>>this medication helps with allergies, post nasal drip, and cough   Restart Singulair daily   Start Nasal saline rinses  Start flonase 1 spray each nostril for 7 days, then can use as need  Keep follow up with Dr. Sherene SiresWert

## 2018-02-21 ENCOUNTER — Encounter: Payer: Self-pay | Admitting: Pulmonary Disease

## 2018-02-23 NOTE — Progress Notes (Signed)
Chart and office note reviewed in detail  > agree with a/p as outlined    

## 2018-02-26 DIAGNOSIS — A6004 Herpesviral vulvovaginitis: Secondary | ICD-10-CM | POA: Diagnosis not present

## 2018-02-26 DIAGNOSIS — R05 Cough: Secondary | ICD-10-CM | POA: Diagnosis not present

## 2018-02-26 DIAGNOSIS — I10 Essential (primary) hypertension: Secondary | ICD-10-CM | POA: Diagnosis not present

## 2018-02-26 DIAGNOSIS — Z6834 Body mass index (BMI) 34.0-34.9, adult: Secondary | ICD-10-CM | POA: Diagnosis not present

## 2018-02-26 DIAGNOSIS — R7302 Impaired glucose tolerance (oral): Secondary | ICD-10-CM | POA: Diagnosis not present

## 2018-02-26 DIAGNOSIS — J219 Acute bronchiolitis, unspecified: Secondary | ICD-10-CM | POA: Diagnosis not present

## 2018-03-06 ENCOUNTER — Ambulatory Visit: Payer: BLUE CROSS/BLUE SHIELD | Admitting: Internal Medicine

## 2018-03-10 DIAGNOSIS — K7581 Nonalcoholic steatohepatitis (NASH): Secondary | ICD-10-CM | POA: Diagnosis not present

## 2018-03-10 DIAGNOSIS — K219 Gastro-esophageal reflux disease without esophagitis: Secondary | ICD-10-CM | POA: Diagnosis not present

## 2018-03-10 DIAGNOSIS — Z1211 Encounter for screening for malignant neoplasm of colon: Secondary | ICD-10-CM | POA: Diagnosis not present

## 2018-03-11 ENCOUNTER — Ambulatory Visit (INDEPENDENT_AMBULATORY_CARE_PROVIDER_SITE_OTHER): Payer: BLUE CROSS/BLUE SHIELD | Admitting: Internal Medicine

## 2018-03-11 ENCOUNTER — Encounter: Payer: Self-pay | Admitting: Internal Medicine

## 2018-03-11 DIAGNOSIS — J45991 Cough variant asthma: Secondary | ICD-10-CM | POA: Diagnosis not present

## 2018-03-11 MED ORDER — PREDNISONE 10 MG PO TABS: ORAL_TABLET | ORAL | 0 refills | Status: DC

## 2018-03-11 MED ORDER — HYDROCODONE-ACETAMINOPHEN 5-300 MG PO TABS: ORAL_TABLET | ORAL | 0 refills | Status: DC

## 2018-03-11 NOTE — Patient Instructions (Signed)
Symbicort 80  Take 2 puffs first thing in am and then another 2 puffs about 12 hours later.   Pantoprazole (protonix) 40 mg   Take  30-60 min before first meal of the day and Pepcid (famotidine)  20 mg one @  bedtime until return to office - this is the best way to tell whether stomach acid is contributing to your problem.     GERD (REFLUX)  is an extremely common cause of respiratory symptoms just like yours , many times with no obvious heartburn at all.    It can be treated with medication, but also with lifestyle changes including elevation of the head of your bed (ideally with 6-8inch blocks under the headboard of your bed),  Smoking cessation, avoidance of late meals, excessive alcohol, and avoid fatty foods, chocolate, peppermint, colas, red wine, and acidic juices such as orange juice. Avoid coffee too  NO MINT OR MENTHOL PRODUCTS SO NO COUGH DROPS  USE SUGARLESS CANDY INSTEAD (Jolley ranchers or Stover's or Life Savers) or even ice chips will also do - the key is to swallow to prevent all throat clearing. NO OIL BASED VITAMINS - use powdered substitutes.  Avoid fish oil when coughing.  Try zyrtec 10 mg after supper   Take delsym two tsp every 12 hours and supplement if needed with  vicodin up to 1every 4 hours to suppress the urge to cough. Swallowing water and/or using ice chips/non mint and menthol containing candies (such as lifesavers or sugarless jolly ranchers) are also effective.  You should rest your voice and avoid activities that you know make you cough.  Once you have eliminated the cough for 3 straight days try reducing the vicodin first,  then the delsym as tolerated.    On 03/14/18 Prednisone 10 mg take  4 each am x 2 days,   2 each am x 2 days,  1 each am x 2 days and stop   Please schedule a follow up office visit in 2 weeks, sooner if needed  with all medications /inhalers/ solutions in hand so we can verify exactly what you are taking. This includes all medications from  all doctors and over the counters

## 2018-03-11 NOTE — Progress Notes (Signed)
Subjective:    Patient ID: Sue Mitchell, female   DOB: [birth year]     MRN: [id number]   Brief patient profile:  62 yobf  RT quit smoking 2011 @ wt 140  with h/o sinus symptoms assoc with cough and need for saba prn as teenager while living in [place] but after arrival here at age late 81's it changed to point where bothered her mostly with onset of cold weather typically req ov rx  abx and prednisone help a lot and this pattern continued even after quit smoking to where the episodes last longer and onset of this not directed linked to cold weather in fact  had one April 2018 never  Completely cleared  then flared again in Oct 01 2016 severe dry cough / eval by allergist /ent since onset neg w/u so referred by Dr Doran Heater to pulmonary clinic 12/26/16     History of Present Illness  12/26/2016 1st [name]/ Sue Mitchell   Chief Complaint  Patient presents with  . Pulm Consult    Pt referred by Dr. Billy Fischer ENT. Pt has productive cough-clear mucus sometimes yellow. Pt had horseness in voice for 6-8 weeks, no chills or fever, have some chest pain and tightness with cough.  recurrent cough since April 2018 never resolved p rx as uri then flaired early August 2018 rx zpak/pred/saba> some better  Cough was worse at hs now   Am feels wheezy not really coughing much up but what she does is white and < 1 tbsp Cough seems worse with exp to cold air at work  Assoc overt hb better on zantac  Some gen ant chest discomfort with severe coughing fits  rec Plan A = Automatic =  symbicort 80 Take 2 puffs first thing in am and then another 2 puffs about 12 hours later.  Plan B = Backup Only use your albuterol as a rescue medication  Start zantac 150- 300 mg after bfast and after supper until cough better  GERD diet    02/06/2017  f/u ov/Zarin Hagmann re:  GOLD I copd/ cough on cold air exp  Chief Complaint  Patient presents with  . Follow-up    Cough had improved but then worsened again  with colder weather. She is using her albuterol inhaler 2 x per wk on average.   not limited by doe / steps ok except for knees  Some gerd at hs taking h2 am only  rec Pantoprazole (protonix) 40 mg  Take  30-60 min before first meal of the day and Zantac  300 mg bedtime until return to office - this is the best way to tell whether stomach acid is contributing to your problem.   GERD diet  Please schedule a follow up office visit in 6 weeks, call sooner if needed     10/21/2017  f/u ov/Sue Mitchell re: acute refractory cough / saw allergist cannot name "all neg"  ? [name]  Chief Complaint  Patient presents with  . Follow-up    Last seen by [name] 02/06/17. Patient states she has had a productive cough, post nasal drip, and constantly using her voice. At night, she becomes so congested that she has to breathe through her mouth instead of nose.    from last ov until 09/25/17 fine on on ranitidine one bid/symb 80 2bid Never took protonix  Was still needing saba twice a week at baseline and a lot more while working at [employer]  Then abruptly  worse Aug 1  lots sinus drainage clear > zpak early in Aug by Ocie Bob some better for a week or two then worse says ran out of symbicort one day prior to ov / using lots of saba now and severe nasal congestion as well as hoarseness and can't sleep due to cough rec Plan A = Automatic = symbiocort 80 Take 2 puffs first thing in am and then another 2 puffs about 12 hours later.  Plan B = Backup Only use your albuterol as a rescue medication  Prednisone 10 mg take  4 each am x 2 days,   2 each am x 2 days,  1 each am x 2 days and stop  Pantoprazole (protonix) 40 mg   Take  30-60 min before first meal of the day and ranitidine 300 mg each evening GERD diet      11/04/2017  f/u ov/Sue Mitchell re: atypical asthma in RT  Chief Complaint  Patient presents with  . Follow-up    Pt c/o sneezing, wheezing and cough with clear sputum- started after she mowed her lawn 1 day ago. She  has had to use her albuterol inhaler.   wakes up feeling good most am's s cough/ wheeze about 30 min later takes first 2 pffs of symb 80 around 5 15 am  Then next 2 pffs after 9 pm  Had been doing great prior mowing grass one day prior to OV Rarely need saba since last flare    Not limited by breathing from desired activities   rec Continue symbicort 80 Take 2 puffs first thing in am and then another 2 puffs about 12 hours later thru spacer Add singulair 10 mg each pm    12/04/2017  f/u ov/[name] re:   symb 80 2bid/ singualir sometimes  Chief Complaint  Patient presents with  . Follow-up    PFT today, continues to have cough with hoarseness. She uses her albuterol inhaler 1 x per wk on average.   Dyspnea:  fine Cough: p grass exp / chemicals/ cologne dry Sleeping: flat/ one pillow no noct symptms SABA use: rare 02: none   Flare of cough / drainage 12/01/17 > has not tried otc's Plan A = Automatic = symbicort 80 Take 2 puffs first thing in am and then another 2 puffs about 12 hours later and remember to breath it out through your nose  singulair  10 mg every evening  Plan B = Backup Only use your albuterol as a rescue medication  For nasal symptoms > zyrtec 10 mg on at bedtime as needed > too sleepy    02/20/18 NP ov rec Augmentin/ pred/ antihistamine/singulair > no better and could not take singulair / did not try zyrtec in am "makes me sleepy"   03/11/2018 extended  f/u ov/Sue Mitchell re: refractory  Cough since aug 2018    Chief Complaint  Patient presents with  . Follow-up    Cough not improving. She is coughing up clear to white sputum. She states her cough is worse when she goes to work and when she first lies down at night. She is using her albuterol inhaler once daily on average.   Dyspnea:  Not limited by breathing from desired activities   Cough: worse around 5pm  Then immediately when supine x then some during the night, never took zyrtec at hs as rec with sense of globus and  throat / chest "congestion" but no excess mucus  Sleeping: on flat bed on side  SABA use: ? if  neb helps more than hfa - not really  Sure but "it sure makes me shake" Still using mint products      No obvious other patterns in  day to day or daytime variability or assoc  purulent sputum or mucus plugs or hemoptysis or cp or chest tightness, subjective wheeze or overt sinus or hb symptoms.    Also denies any obvious fluctuation of symptoms with weather or environmental changes or other aggravating or alleviating factors except as outlined above   No unusual exposure hx or h/o childhood pna/ asthma or knowledge of premature birth.  Current Allergies, Complete Past Medical History, Past Surgical History, Family History, and Social History were reviewed in [facility] electronic medical record.  ROS  The following are not active complaints unless bolded Hoarseness, sore throat, dysphagia, dental problems, itching, sneezing,  nasal congestion or discharge of excess mucus or purulent secretions, ear ache,   fever, chills, sweats, unintended wt loss or wt gain, classically pleuritic or exertional cp,  orthopnea pnd or arm/hand swelling  or leg swelling, presyncope, palpitations, abdominal pain, anorexia, nausea, vomiting, diarrhea  or change in bowel habits or change in bladder habits, change in stools or change in urine, dysuria, hematuria,  rash, arthralgias, visual complaints, headache, numbness, weakness or ataxia or problems with walking or coordination,  change in mood or  memory.        Current Meds  Medication Sig  . acyclovir (ZOVIRAX) 200 MG capsule Take 200 mg by mouth 2 (two) times daily as needed.   Marland Kitchen. albuterol (PROVENTIL HFA;VENTOLIN HFA) 108 (90 BASE) MCG/ACT inhaler Inhale 2 puffs into the lungs every 6 (six) hours as needed for wheezing or shortness of breath.  . cholecalciferol (VITAMIN D) 1000 UNITS tablet Take 5,000 Units by mouth 2 (two) times a week.  . DOCOSAHEXAENOIC ACID  PO Take 1 g by mouth daily.  . irbesartan-hydrochlorothiazide (AVALIDE) 300-12.5 MG tablet Take 0.5 tablets by mouth daily.  .     . Omega-3 1000 MG CAPS Take by mouth.  . pantoprazole (PROTONIX) 40 MG tablet TAKE 1 TABLET(40 MG) BY MOUTH DAILY 30 TO 60 MINUTES BEFORE FIRST MEAL OF THE DAY  . ranitidine (ZANTAC) 300 MG tablet Take 300 mg by mouth at bedtime.  . SYMBICORT 80-4.5 MCG/ACT inhaler INHALE 2 PUFFS EVERY 12 HOURS                        Objective:   Physical Exam    amb pleasant bf harsh upper airway cough to point of gagging    03/11/2018       173  12/04/2017     170  11/04/2017       169  10/21/2017       169  02/06/2017     171   12/26/16 171 lb 6.4 oz (77.7 kg)  11/18/13 170 lb (77.1 kg)  03/21/11 171 lb (77.6 kg)    Vital signs reviewed - Note on arrival 02 sats  96% on RA   HEENT: nl dentition, turbinates bilaterally, and oropharynx. Nl external ear canals without cough reflex   NECK :  without JVD/Nodes/TM/ nl carotid upstrokes bilaterally   LUNGS: no acc muscle use,  Nl contour chest which is clear to A and P bilaterally without cough on insp or exp maneuvers   CV:  RRR  no s3 or murmur or increase in P2, and no edema   ABD:  soft and nontender with  nl inspiratory excursion in the supine position. No bruits or organomegaly appreciated, bowel sounds nl  MS:  Nl gait/ ext warm without deformities, calf tenderness, cyanosis or clubbing No obvious joint restrictions   SKIN: warm and dry without lesions    NEURO:  alert, approp, nl sensorium with  no motor or cerebellar deficits apparent.          Assessment:

## 2018-03-12 ENCOUNTER — Encounter: Payer: Self-pay | Admitting: Internal Medicine

## 2018-03-12 MED ORDER — FAMOTIDINE 20 MG PO TABS: ORAL_TABLET | ORAL | Status: DC

## 2018-03-12 NOTE — Assessment & Plan Note (Addendum)
Recurrent cough since teenager really bad and daily since Aug 2018 12/26/2016  rec symbicort 80 2bid then return for full pfts - PFT's  02/06/2017  FEV1 1.37 (80 % ) ratio 67  p 5 % improvement from saba p symb 80  prior to study with DLCO  66/66c % corrects to 79  % for alv volume   - Spirometry 10/21/2017  FEV1 1.18 (70%)  Ratio 71 with min curvature during attack of cough/ mostly pseudowheeze on exam on symb 80 x2  - FENO 10/21/2017  =   7  On symb 80 x 2  - 11/04/2017     90% with spacer  - singulair added 11/04/2017  But not taking consistently as of 12/04/2017 > rec restart daily  X one week and stopped it due to  "dizzy"  - PFT's  12/04/2017  FEV1 1.34 (85 % ) ratio 67  p 20 % improvement from saba p nothing prior to study with DLCO  71 % corrects to 80  % for alv volume   - Allergy profile 12/04/2017 >  Eos 0.0 /  IgE  546 dust only   - cyclical cough rx 03/11/2018    DDX of  difficult airways management almost all start with A and  include Adherence, Ace Inhibitors, Acid Reflux, Active Sinus Disease, Alpha 1 Antitripsin deficiency, Anxiety masquerading as Airways dz,  ABPA,  Allergy(esp in young), Aspiration (esp in elderly), Adverse effects of meds,  Active smoking or vaping, A bunch of PE's (a small clot burden can't cause this syndrome unless there is already severe underlying pulm or vascular dz with poor reserve) plus two Bs  = Bronchiectasis and Beta blocker use..and one C= CHF   Adherence is always the initial "prime suspect" and is a multilayered concern that requires a "trust but verify" approach in every patient - starting with knowing how to use medications, especially inhalers, correctly, keeping up with refills and understanding the fundamental difference between maintenance and prns vs those medications only taken for a very short course and then stopped and not refilled.  - return with all meds in hand using a trust but verify approach to confirm accurate Medication  Reconciliation  The principal here is that until we are certain that the  patients are doing what we've asked, it makes no sense to ask them to do more.   ? Acid (or non-acid) GERD > always difficult to exclude as up to 75% of pts in some series report no assoc GI/ Heartburn symptoms> rec continue max (24h)  acid suppression and diet restrictions/ reviewed    - Of the three most common causes of  Sub-acute / recurrent or chronic cough, only one (GERD)  can actually contribute to/ trigger  the other two (asthma and post nasal drip syndrome)  and perpetuate the cylce of cough.  While not intuitively obvious, many patients with chronic low grade reflux do not cough until there is a primary insult that disturbs the protective epithelial barrier and exposes sensitive nerve endings.   This is typically viral but can due to PNDS and  either may apply here.   The point is that once this occurs, it is difficult to eliminate the cycle  using anything but a maximally effective acid suppression regimen at least in the short run, accompanied by an appropriate diet to address non acid GERD and control / eliminate the cough itself for at least 3 days with vicodin  ? Allergy /asthma > strongly  doubt at this point so no need to increase ICS dose which may aggravate uacs component / can't tol singulair so leave off   ? Rhintis/ pnds > try zyrtec in pm instead of am   ? Anxiety/depression > usually at the bottom of this list of usual suspects but should be included here base on globus sensation and may interfere with adherence and also interpretation of response or lack thereof to symptom management which can be quite subjective.    I had an extended discussion with the patient reviewing all relevant studies completed to date and  lasting 25 minutes of a 40  minute extended  visit addressing severe non-specific but potentially very serious refractory respiratory symptoms of uncertain and potentially multiple  etiologies.   Each  maintenance medication was reviewed in detail including most importantly the difference between maintenance and prns and under what circumstances the prns are to be triggered using an action plan format that is not reflected in the computer generated alphabetically organized AVS.    Please see AVS for specific instructions unique to this office visit that I personally wrote and verbalized to the the pt in detail and then reviewed with pt  by my nurse highlighting any changes in therapy/plan of care  recommended at today's visit.

## 2018-03-18 ENCOUNTER — Telehealth: Payer: Self-pay | Admitting: Internal Medicine

## 2018-03-18 MED ORDER — BUDESONIDE-FORMOTEROL FUMARATE 80-4.5 MCG/ACT IN AERO: 2.0000 | INHALATION_SPRAY | Freq: Two times a day (BID) | RESPIRATORY_TRACT | 0 refills | Status: DC

## 2018-03-18 NOTE — Telephone Encounter (Signed)
Call made to patient, made aware sample and patient assistance application is being placed upfront. She states it will cost $300/month. I explained to patient we would try to get her some assistance. Sample and application placed upfront for pick up. Nothing further is needed at this time.

## 2018-03-24 ENCOUNTER — Telehealth: Payer: Self-pay | Admitting: Internal Medicine

## 2018-03-24 NOTE — Telephone Encounter (Signed)
Spoke with pt. States that her cough has returned. Reports dry cough and wheezing. Denies chest tightness, shortness of breath or fever. Pt has been following the treatment plan that MW gave her at her 03/11/2018 OV. The cough had subsided but has come back with a vengeance per the pt. She can't come in for an appointment due to her work schedule. Pt would like MW's recommendations.  MW - please advise. Thanks.

## 2018-03-24 NOTE — Telephone Encounter (Signed)
Spoke with pt. She is aware of MW's recommendation. Pt has an appointment already scheduled with MW on 03/26/2018. Nothing further was needed.

## 2018-03-24 NOTE — Telephone Encounter (Signed)
Nothing to offer over the phone and she was supposed to be back this week with all meds in hand to sort out what she needs longterm to stay better so can't take next step s this step

## 2018-03-26 ENCOUNTER — Encounter: Payer: Self-pay | Admitting: Internal Medicine

## 2018-03-26 ENCOUNTER — Ambulatory Visit (INDEPENDENT_AMBULATORY_CARE_PROVIDER_SITE_OTHER)
Admission: RE | Admit: 2018-03-26 | Discharge: 2018-03-26 | Disposition: A | Discharge status: Home / Self Care | Payer: BLUE CROSS/BLUE SHIELD | Source: Ambulatory Visit | Attending: Internal Medicine | Admitting: Internal Medicine

## 2018-03-26 ENCOUNTER — Ambulatory Visit: Payer: BLUE CROSS/BLUE SHIELD | Admitting: Internal Medicine

## 2018-03-26 DIAGNOSIS — R05 Cough: Secondary | ICD-10-CM

## 2018-03-26 DIAGNOSIS — J45991 Cough variant asthma: Secondary | ICD-10-CM

## 2018-03-26 DIAGNOSIS — R059 Cough, unspecified: Secondary | ICD-10-CM

## 2018-03-26 MED ORDER — GABAPENTIN 100 MG PO CAPS: 100.0000 mg | ORAL_CAPSULE | Freq: Three times a day (TID) | ORAL | 2 refills | Status: DC

## 2018-03-26 MED ORDER — BUDESONIDE-FORMOTEROL FUMARATE 80-4.5 MCG/ACT IN AERO: 2.0000 | INHALATION_SPRAY | Freq: Two times a day (BID) | RESPIRATORY_TRACT | 0 refills | Status: DC

## 2018-03-26 MED ORDER — PREDNISONE 10 MG PO TABS: ORAL_TABLET | ORAL | 0 refills | Status: DC

## 2018-03-26 NOTE — Patient Instructions (Addendum)
Gabapentin 100 mg three times a day  Prednisone 10 mg take  4 each am x 2 days,   2 each am x 2 days,  1 each am x 2 days and stop   Resume symbicort 80 Take 2 puffs first thing in am and then another 2 puffs about 12 hours later.   Work on inhaler technique:  relax and gently blow all the way out then take a nice smooth deep breath back in, triggering the inhaler at same time you start breathing in.  Hold for up to 5 seconds if you can. Blow out thru nose. Rinse and gargle with water when done    Only use your albuterol as a rescue medication to be used if you can't catch your breath by resting or doing a relaxed purse lip breathing pattern.  - The less you use it, the better it will work when you need it. - Ok to use up to 2 puffs  every 4 hours if you must but call for immediate appointment if use goes up over your usual need - Don't leave home without it !!  (think of it like the spare tire for your car)   Please see patient coordinator before you leave today  to schedule sinus CT  Please remember to go to the  x-ray department  for your tests - we will call you with the results when they are available    Please schedule a follow up office visit in 2 weeks, sooner if needed  with all medications /inhalers/ solutions in hand so we can verify exactly what you are taking. This includes all medications from all doctors and over the counters - sample of symb 80 plus has 36 left on sample she has

## 2018-03-26 NOTE — Progress Notes (Addendum)
Subjective:    Patient ID: Sue Mitchell, female   DOB: [birth year]     MRN: [id number]   Brief patient profile:  62 yobf  RT quit smoking 2011 @ wt 140  with h/o sinus symptoms assoc with cough and need for saba prn as teenager while living in [place] but after arrival here at age late 81's it changed to point where bothered her mostly with onset of cold weather typically req ov rx  abx and prednisone help a lot and this pattern continued even after quit smoking to where the episodes last longer and onset of this not directed linked to cold weather in fact  had one April 2018 never  Completely cleared  then flared again in Oct 01 2016 severe dry cough / eval by allergist /ent since onset neg w/u so referred by Dr Doran Heater to pulmonary clinic 12/26/16     History of Present Illness  12/26/2016 1st [name]/ [name]   Chief Complaint  Patient presents with  . Pulm Consult    Pt referred by Dr. Billy Fischer ENT. Pt has productive cough-clear mucus sometimes yellow. Pt had horseness in voice for 6-8 weeks, no chills or fever, have some chest pain and tightness with cough.  recurrent cough since April 2018 never resolved p rx as uri then flaired early August 2018 rx zpak/pred/saba> some better  Cough was worse at hs now   Am feels wheezy not really coughing much up but what she does is white and < 1 tbsp Cough seems worse with exp to cold air at work  Assoc overt hb better on zantac  Some gen ant chest discomfort with severe coughing fits  rec Plan A = Automatic =  symbicort 80 Take 2 puffs first thing in am and then another 2 puffs about 12 hours later.  Plan B = Backup Only use your albuterol as a rescue medication  Start zantac 150- 300 mg after bfast and after supper until cough better  GERD diet    02/06/2017  f/u ov/[name] re:  GOLD I copd/ cough on cold air exp  Chief Complaint  Patient presents with  . Follow-up    Cough had improved but then worsened again  with colder weather. She is using her albuterol inhaler 2 x per wk on average.   not limited by doe / steps ok except for knees  Some gerd at hs taking h2 am only  rec Pantoprazole (protonix) 40 mg  Take  30-60 min before first meal of the day and Zantac  300 mg bedtime until return to office - this is the best way to tell whether stomach acid is contributing to your problem.   GERD diet  Please schedule a follow up office visit in 6 weeks, call sooner if needed     10/21/2017  f/u ov/[name] re: acute refractory cough / saw allergist cannot name "all neg"  ? [name]  Chief Complaint  Patient presents with  . Follow-up    Last seen by [name] 02/06/17. Patient states she has had a productive cough, post nasal drip, and constantly using her voice. At night, she becomes so congested that she has to breathe through her mouth instead of nose.    from last ov until 09/25/17 fine on on ranitidine one bid/symb 80 2bid Never took protonix  Was still needing saba twice a week at baseline and a lot more while working at [employer]  Then abruptly  worse Aug 1  lots sinus drainage clear > zpak early in Aug by [name] some better for a week or two then worse says ran out of symbicort one day prior to ov / using lots of saba now and severe nasal congestion as well as hoarseness and can't sleep due to cough rec Plan A = Automatic = symbiocort 80 Take 2 puffs first thing in am and then another 2 puffs about 12 hours later.  Plan B = Backup Only use your albuterol as a rescue medication  Prednisone 10 mg take  4 each am x 2 days,   2 each am x 2 days,  1 each am x 2 days and stop  Pantoprazole (protonix) 40 mg   Take  30-60 min before first meal of the day and ranitidine 300 mg each evening GERD diet      11/04/2017  f/u ov/[name] re: atypical asthma in RT  Chief Complaint  Patient presents with  . Follow-up    Pt c/o sneezing, wheezing and cough with clear sputum- started after she mowed her lawn 1 day ago. She  has had to use her albuterol inhaler.   wakes up feeling good most am's s cough/ wheeze about 30 min later takes first 2 pffs of symb 80 around 5 15 am  Then next 2 pffs after 9 pm  Had been doing great prior mowing grass one day prior to OV Rarely need saba since last flare    Not limited by breathing from desired activities   rec Continue symbicort 80 Take 2 puffs first thing in am and then another 2 puffs about 12 hours later thru spacer Add singulair 10 mg each pm    12/04/2017  f/u ov/[name] re:   symb 80 2bid/ singualir sometimes  Chief Complaint  Patient presents with  . Follow-up    PFT today, continues to have cough with hoarseness. She uses her albuterol inhaler 1 x per wk on average.   Dyspnea:  fine Cough: p grass exp / chemicals/ cologne dry Sleeping: flat/ one pillow no noct symptms SABA use: rare 02: none   Flare of cough / drainage 12/01/17 > has not tried otc's Plan A = Automatic = symbicort 80 Take 2 puffs first thing in am and then another 2 puffs about 12 hours later and remember to breath it out through your nose  singulair  10 mg every evening  Plan B = Backup Only use your albuterol as a rescue medication  For nasal symptoms > zyrtec 10 mg on at bedtime as needed > too sleepy    02/20/18 NP ov rec Augmentin/ pred/ antihistamine/singulair > no better and could not take singulair / did not try zyrtec in am "makes me sleepy"     03/11/2018 extended  f/u ov/[name] re: refractory  Cough since aug 2018    Chief Complaint  Patient presents with  . Follow-up    Cough not improving. She is coughing up clear to white sputum. She states her cough is worse when she goes to work and when she first lies down at night. She is using her albuterol inhaler once daily on average.   Dyspnea:  Not limited by breathing from desired activities   Cough: worse around 5pm  Then immediately when supine x then some during the night, never took zyrtec at hs as rec with sense of globus and  throat / chest "congestion" but no excess mucus  Sleeping: on flat bed on side  SABA use: ?  if neb helps more than hfa - not really  Sure but "it sure makes me shake" Still using mint products  rec Symbicort 80  Take 2 puffs first thing in am and then another 2 puffs about 12 hours later.  Pantoprazole (protonix) 40 mg   Take  30-60 min before first meal of the day and Pepcid (famotidine)  20 mg one @  bedtime until return to office - this is the best way to tell whether stomach acid is contributing to your problem.   GERD diet  Try zyrtec 10 mg after supper  Take delsym two tsp every 12 hours and supplement if needed with  vicodin up to 1every 4 hours to suppress the urge to cough. Swallowing water and/or using ice chips/non mint and menthol containing candies (such as lifesavers or sugarless jolly ranchers) are also effective.  You should rest your voice and avoid activities that you know make you cough. Once you have eliminated the cough for 3 straight days try reducing the vicodin first,  then the delsym as tolerated.   On 03/14/18 Prednisone 10 mg take  4 each am x 2 days,   2 each am x 2 days,  1 each am x 2 days and stop  Please schedule a follow up office visit in 2 weeks, sooner if needed  with all medications /inhalers/ solutions in hand so we can verify exactly what you are taking. This includes all medications from all doctors and over the counters   03/26/2018  f/u ov/Maytte Jacot re: refractory cough/ wheeze on symb 80 2bid  Chief Complaint  Patient presents with  . Follow-up    Pt c/o sinus congestion, cough with yellow sputum and wheezing. She is using her albuterol inhaler once per wk on average.   Dyspnea:  Ok unless cough Cough: worse at work and at home same severity/ frequency  Sleeping: not coughing while sleeping  SABA use:  Not really helping 02: none  gen ant chest soreness from coughing/ not able to use vicodin daytime to suppress cough    No obvious day to day or daytime  variability or assoc excess/ purulent sputum or mucus plugs or hemoptysis or   chest tightness,  or overt sinus or hb symptoms.   Sleeping  without nocturnal  or early am exacerbation  of respiratory  c/o's or need for noct saba. Also denies any obvious fluctuation of symptoms with weather or environmental changes or other aggravating or alleviating factors except as outlined above   No unusual exposure hx or h/o childhood pna/ asthma or knowledge of premature birth.  Current Allergies, Complete Past Medical History, Past Surgical History, Family History, and Social History were reviewed in [employer] record.  ROS  The following are not active complaints unless bolded Hoarseness, sore throat, dysphagia, dental problems, itching, sneezing,  nasal congestion or discharge of excess mucus or purulent secretions, ear ache,   fever, chills, sweats, unintended wt loss or wt gain, classically pleuritic or exertional cp,  orthopnea pnd or arm/hand swelling  or leg swelling, presyncope, palpitations, abdominal pain, anorexia, nausea, vomiting, diarrhea  or change in bowel habits or change in bladder habits, change in stools or change in urine, dysuria, hematuria,  rash, arthralgias, visual complaints, headache, numbness, weakness or ataxia or problems with walking or coordination,  change in mood or  memory.        Current Meds  Medication Sig  . acyclovir (ZOVIRAX) 200 MG capsule Take 200 mg  by mouth 2 (two) times daily as needed.   Marland Kitchen albuterol (PROVENTIL HFA;VENTOLIN HFA) 108 (90 BASE) MCG/ACT inhaler Inhale 2 puffs into the lungs every 6 (six) hours as needed for wheezing or shortness of breath.  . budesonide-formoterol (SYMBICORT) 80-4.5 MCG/ACT inhaler Inhale 2 puffs into the lungs every 12 (twelve) hours.  . cetirizine (ZYRTEC) 10 MG tablet Take 10 mg by mouth daily.  . cholecalciferol (VITAMIN D) 1000 UNITS tablet Take 5,000 Units by mouth 2 (two) times a week.  Marland Kitchen  dextromethorphan (DELSYM) 30 MG/5ML liquid Take by mouth as needed for cough.  . DOCOSAHEXAENOIC ACID PO Take 1 g by mouth daily.  . famotidine (PEPCID) 20 MG tablet One at bedtime  . HYDROcodone-Acetaminophen (VICODIN) 5-300 MG TABS One every 4 hours as needed  . irbesartan-hydrochlorothiazide (AVALIDE) 300-12.5 MG tablet Take 0.5 tablets by mouth daily.  . Omega-3 Fatty Acids (FISH OIL) 1000 MG CAPS Take 1 capsule by mouth daily.  . pantoprazole (PROTONIX) 40 MG tablet TAKE 1 TABLET(40 MG) BY MOUTH DAILY 30 TO 60 MINUTES BEFORE FIRST MEAL OF THE DAY                        Objective:   Physical Exam    amb hoarse bf nad harsh upper airway cough    03/26/2018       173  03/11/2018       173  12/04/2017     170  11/04/2017       169  10/21/2017       169  02/06/2017     171   12/26/16 171 lb 6.4 oz (77.7 kg)  11/18/13 170 lb (77.1 kg)  03/21/11 171 lb (77.6 kg)    Vital signs reviewed - Note on arrival 02 sats  97% on RA        HEENT: nl dentition, turbinates bilaterally, and oropharynx. Nl external ear canals without cough reflex   NECK :  without JVD/Nodes/TM/ nl carotid upstrokes bilaterally   LUNGS: no acc muscle use,  Nl contour chest with lots of transmitted upper airway noise   CV:  RRR  no s3 or murmur or increase in P2, and no edema   ABD:  soft and nontender with nl inspiratory excursion in the supine position. No bruits or organomegaly appreciated, bowel sounds nl  MS:  Nl gait/ ext warm without deformities, calf tenderness, cyanosis or clubbing No obvious joint restrictions   SKIN: warm and dry without lesions    NEURO:  alert, approp, nl sensorium with  no motor or cerebellar deficits apparent.    CXR PA and Lateral:   03/26/2018 :    I personally reviewed images and agree with radiology impression as follows:    1.  No active cardiopulmonary disease is radiographically apparent. 2. Mild thoracic spondylosis.          Assessment:

## 2018-03-27 ENCOUNTER — Encounter: Payer: Self-pay | Admitting: Internal Medicine

## 2018-03-27 ENCOUNTER — Telehealth: Payer: Self-pay | Admitting: Internal Medicine

## 2018-03-27 NOTE — Telephone Encounter (Signed)
LMTCB  Notes recorded by Nyoka Cowden, MD on 03/27/2018 at 4:47 AM EST Call pt: Reviewed cxr and no acute change so no change in recommendations made at ov.

## 2018-03-27 NOTE — Assessment & Plan Note (Addendum)
Recurrent cough since teenager really bad and daily since Aug 2018 12/26/2016  rec symbicort 80 2bid then return for full pfts - PFT's  02/06/2017  FEV1 1.37 (80 % ) ratio 67  p 5 % improvement from saba p symb 80  prior to study with DLCO  66/66c % corrects to 79  % for alv volume   - Spirometry 10/21/2017  FEV1 1.18 (70%)  Ratio 71 with min curvature during attack of cough/ mostly pseudowheeze on exam on symb 80 x2  - FENO 10/21/2017  =   7  On symb 80 x 2  - 11/04/2017     90% with spacer  - singulair added 11/04/2017  But not taking consistently as of 12/04/2017 > rec restart daily  X one week and stopped it due "dizzy"  - PFT's  12/04/2017  FEV1 1.34 (85 % ) ratio 67  p 20 % improvement from saba p nothing prior to study with DLCO  71 % corrects to 80  % for alv volume   - Allergy profile 12/04/2017 >  Eos 0.0 /  IgE  546 dust only  - cyclical cough rx 03/11/2018  - Spirometry 03/26/2018  FEV1 1.1 (73%)  Ratio 0.64 - 03/26/2018  After extensive coaching inhaler device,  effectiveness =    90% s spacer  - Gabapentin 100 mg tid trial 03/26/2018  - Sinus CT ordered    Clearly has components of asthma and uacs and problem is that higher doses of symbicort are likely to make uacs worse and cyclical coughing to point of vcd is making it difficult to use ics at all so rec   1) Prednisone 10 mg take  4 each am x 2 days,   2 each am x 2 days,  1 each am x 2 days and stop  2) resume symbicort 80 2bid but check counts at each ov to assure compliance 3) control cough with gabapentin titrated as high as 300 tid if needed  4) continue max gerd rx and diet   F/u q 2 weeks until better controlled    I had an extended discussion with the patient reviewing all relevant studies completed to date and  lasting 15 to 20 minutes of a 25 minute visit    See device teaching which extended face to face time for this visit.  Each maintenance medication was reviewed in detail including emphasizing most importantly  the difference between maintenance and prns and under what circumstances the prns are to be triggered using an action plan format that is not reflected in the computer generated alphabetically organized AVS which I have not found useful in most complex patients, especially with respiratory illnesses  Please see AVS for specific instructions unique to this visit that I personally wrote and verbalized to the the pt in detail and then reviewed with pt  by my nurse highlighting any  changes in therapy recommended at today's visit to their plan of care.

## 2018-03-27 NOTE — Progress Notes (Signed)
LMTCB

## 2018-03-27 NOTE — Telephone Encounter (Signed)
Call made to patient, made aware of the below:  Notes recorded by Nyoka Cowden, MD on 03/27/2018 at 4:47 AM EST Call pt: Reviewed cxr and no acute change so no change in recommendations made at ov.   While on phone she states she was called by a staff member to schedule her Sinus CT but the date she had she was not able to keep. Number for central scheduling given. Verified by Cherylynn Ridges. Patient voiced understanding. Nothing further is needed at this time.

## 2018-03-27 NOTE — Telephone Encounter (Signed)
Patient returning phone call.  Patient phone number is [phone].

## 2018-04-07 ENCOUNTER — Ambulatory Visit (INDEPENDENT_AMBULATORY_CARE_PROVIDER_SITE_OTHER)
Admission: RE | Admit: 2018-04-07 | Discharge: 2018-04-07 | Disposition: A | Discharge status: Home / Self Care | Payer: BLUE CROSS/BLUE SHIELD | Source: Ambulatory Visit | Attending: Internal Medicine | Admitting: Internal Medicine

## 2018-04-07 DIAGNOSIS — J45991 Cough variant asthma: Secondary | ICD-10-CM | POA: Diagnosis not present

## 2018-04-07 DIAGNOSIS — R05 Cough: Secondary | ICD-10-CM

## 2018-04-07 DIAGNOSIS — R059 Cough, unspecified: Secondary | ICD-10-CM

## 2018-04-07 NOTE — Progress Notes (Signed)
Spoke with pt and notified of results per Dr. Wert. Pt verbalized understanding and denied any questions. 

## 2018-04-08 ENCOUNTER — Encounter: Payer: Self-pay | Admitting: Internal Medicine

## 2018-04-08 ENCOUNTER — Other Ambulatory Visit: Payer: BLUE CROSS/BLUE SHIELD

## 2018-04-08 ENCOUNTER — Ambulatory Visit (INDEPENDENT_AMBULATORY_CARE_PROVIDER_SITE_OTHER): Payer: BLUE CROSS/BLUE SHIELD | Admitting: Internal Medicine

## 2018-04-08 VITALS — BP 104/70 | HR 92 | Ht 58.5 in | Wt 164.4 lb

## 2018-04-08 DIAGNOSIS — J45991 Cough variant asthma: Secondary | ICD-10-CM

## 2018-04-08 MED ORDER — BUDESONIDE-FORMOTEROL FUMARATE 80-4.5 MCG/ACT IN AERO: 2.0000 | INHALATION_SPRAY | Freq: Two times a day (BID) | RESPIRATORY_TRACT | 0 refills | Status: DC

## 2018-04-08 MED ORDER — PANTOPRAZOLE SODIUM 40 MG PO TBEC: DELAYED_RELEASE_TABLET | ORAL | 2 refills | Status: DC

## 2018-04-08 MED ORDER — TIOTROPIUM BROMIDE MONOHYDRATE 2.5 MCG/ACT IN AERS: 2.0000 | INHALATION_SPRAY | Freq: Every day | RESPIRATORY_TRACT | 0 refills | Status: DC

## 2018-04-08 MED ORDER — HYDROCODONE-ACETAMINOPHEN 5-325 MG PO TABS: 1.0000 | ORAL_TABLET | Freq: Four times a day (QID) | ORAL | 0 refills | Status: DC | PRN

## 2018-04-08 MED ORDER — TIOTROPIUM BROMIDE-OLODATEROL 2.5-2.5 MCG/ACT IN AERS: 2.0000 | INHALATION_SPRAY | Freq: Every day | RESPIRATORY_TRACT | 0 refills | Status: DC

## 2018-04-08 MED ORDER — ALBUTEROL SULFATE (2.5 MG/3ML) 0.083% IN NEBU
2.5000 mg | INHALATION_SOLUTION | Freq: Once | RESPIRATORY_TRACT | Status: AC
  Administered 2018-04-08: 2.5 mg via RESPIRATORY_TRACT

## 2018-04-08 MED ORDER — PREDNISONE 10 MG PO TABS: ORAL_TABLET | ORAL | 0 refills | Status: DC

## 2018-04-08 NOTE — Patient Instructions (Addendum)
Prednisone 10mg   Take 4 for three days 3 for three days 2 for three days 1 for three days and stop  Plan A = Automatic = Symbicort 80 Take 2 puffs first thing in am and then another 2 puffs about 12 hours later and spiriva 2 puff each am   Plan B = Backup Only use your albuterol inhaler as a rescue medication to be used if you can't catch your breath by resting or doing a relaxed purse lip breathing pattern.  - The less you use it, the better it will work when you need it. - Ok to use the inhaler up to 2 puffs  every 4 hours if you must but call for appointment if use goes up over your usual need - Don't leave home without it !!  (think of it like the spare tire for your car)     Try gabapentin 100 mg twice daily bfast and supper and if can't take it twice daily just take at bedtime   For drainage / throat tickle try take CHLORPHENIRAMINE  4 mg (chlortabs  [facility])  - take one every 4 hours as needed - available over the counter- may cause drowsiness so start with just a bedtime dose or two and see how you tolerate it before trying in daytime    Stay on protonix Take 30- 60 min before your first and last meals of the day   Take delsym two tsp every 12 hours and supplement if needed with  vicodine  up to 2 every 4 hours to suppress the urge to cough. Swallowing water and/or using ice chips/non mint and menthol containing candies (such as lifesavers or sugarless jolly ranchers) are also effective.  You should rest your voice and avoid activities that you know make you cough.  Once you have eliminated the cough for 3 straight days try reducing the vicodin first,  then the delsym as tolerated.   Please schedule a follow up office visit in 2 weeks, sooner if needed  with all medications /inhalers/ solutions in hand so we can verify exactly what you are taking. This includes all medications from all doctors and over the counters - needs spirometry on return

## 2018-04-08 NOTE — Progress Notes (Signed)
Subjective:    Patient ID: Sue Mitchell, female   DOB: [birth year]     MRN: [id number]   Brief patient profile:  62 yobf  RT quit smoking 2011 @ wt 140  with h/o sinus symptoms assoc with cough and need for saba prn as teenager while living in [place] but after arrival here at age late 81's it changed to point where bothered her mostly with onset of cold weather typically req ov rx  abx and prednisone help a lot and this pattern continued even after quit smoking to where the episodes last longer and onset of this not directed linked to cold weather in fact  had one April 2018 never  Completely cleared  then flared again in Oct 01 2016 severe dry cough / eval by allergist /ent since onset neg w/u so referred by Dr Doran Heater to pulmonary clinic 12/26/16     History of Present Illness  12/26/2016 1st [name]/ Sue Mitchell   Chief Complaint  Patient presents with  . Pulm Consult    Pt referred by Dr. Billy Fischer ENT. Pt has productive cough-clear mucus sometimes yellow. Pt had horseness in voice for 6-8 weeks, no chills or fever, have some chest pain and tightness with cough.  recurrent cough since April 2018 never resolved p rx as uri then flaired early August 2018 rx zpak/pred/saba> some better  Cough was worse at hs now   Am feels wheezy not really coughing much up but what she does is white and < 1 tbsp Cough seems worse with exp to cold air at work  Assoc overt hb better on zantac  Some gen ant chest discomfort with severe coughing fits  rec Plan A = Automatic =  symbicort 80 Take 2 puffs first thing in am and then another 2 puffs about 12 hours later.  Plan B = Backup Only use your albuterol as a rescue medication  Start zantac 150- 300 mg after bfast and after supper until cough better  GERD diet    02/06/2017  f/u ov/Sue Mitchell re:  GOLD I copd/ cough on cold air exp  Chief Complaint  Patient presents with  . Follow-up    Cough had improved but then worsened again  with colder weather. She is using her albuterol inhaler 2 x per wk on average.   not limited by doe / steps ok except for knees  Some gerd at hs taking h2 am only  rec Pantoprazole (protonix) 40 mg  Take  30-60 min before first meal of the day and Zantac  300 mg bedtime until return to office - this is the best way to tell whether stomach acid is contributing to your problem.   GERD diet  Please schedule a follow up office visit in 6 weeks, call sooner if needed     10/21/2017  f/u ov/Sue Mitchell re: acute refractory cough / saw allergist cannot name "all neg"  ? Sue Mitchell?  Chief Complaint  Patient presents with  . Follow-up    Last seen by [name] 02/06/17. Patient states she has had a productive cough, post nasal drip, and constantly using her voice. At night, she becomes so congested that she has to breathe through her mouth instead of nose.    from last ov until 09/25/17 fine on on ranitidine one bid/symb 80 2bid Never took protonix  Was still needing saba twice a week at baseline and a lot more while working at [employer]  Then abruptly  worse Aug 1  lots sinus drainage clear > zpak early in Aug by [name] some better for a week or two then worse says ran out of symbicort one day prior to ov / using lots of saba now and severe nasal congestion as well as hoarseness and can't sleep due to cough rec Plan A = Automatic = symbiocort 80 Take 2 puffs first thing in am and then another 2 puffs about 12 hours later.  Plan B = Backup Only use your albuterol as a rescue medication  Prednisone 10 mg take  4 each am x 2 days,   2 each am x 2 days,  1 each am x 2 days and stop  Pantoprazole (protonix) 40 mg   Take  30-60 min before first meal of the day and ranitidine 300 mg each evening GERD diet      11/04/2017  f/u ov/Sue Mitchell re: atypical asthma in RT  Chief Complaint  Patient presents with  . Follow-up    Pt c/o sneezing, wheezing and cough with clear sputum- started after she mowed her lawn 1 day ago. She  has had to use her albuterol inhaler.   wakes up feeling good most am's s cough/ wheeze about 30 min later takes first 2 pffs of symb 80 around 5 15 am  Then next 2 pffs after 9 pm  Had been doing great prior mowing grass one day prior to OV Rarely need saba since last flare    Not limited by breathing from desired activities   rec Continue symbicort 80 Take 2 puffs first thing in am and then another 2 puffs about 12 hours later thru spacer Add singulair 10 mg each pm    12/04/2017  f/u ov/Sue Mitchell re:   symb 80 2bid/ singualir sometimes  Chief Complaint  Patient presents with  . Follow-up    PFT today, continues to have cough with hoarseness. She uses her albuterol inhaler 1 x per wk on average.   Dyspnea:  fine Cough: p grass exp / chemicals/ cologne dry Sleeping: flat/ one pillow no noct symptms SABA use: rare 02: none   Flare of cough / drainage 12/01/17 > has not tried otc's Plan A = Automatic = symbicort 80 Take 2 puffs first thing in am and then another 2 puffs about 12 hours later and remember to breath it out through your nose  singulair  10 mg every evening  Plan B = Backup Only use your albuterol as a rescue medication  For nasal symptoms > zyrtec 10 mg on at bedtime as needed > too sleepy    02/20/18 NP ov rec Augmentin/ pred/ antihistamine/singulair > no better and could not take singulair / did not try zyrtec in am "makes me sleepy"     03/11/2018 extended  f/u ov/Sue Mitchell re: refractory  Cough since aug 2018    Chief Complaint  Patient presents with  . Follow-up    Cough not improving. She is coughing up clear to white sputum. She states her cough is worse when she goes to work and when she first lies down at night. She is using her albuterol inhaler once daily on average.   Dyspnea:  Not limited by breathing from desired activities   Cough: worse around 5pm  Then immediately when supine x then some during the night, never took zyrtec at hs as rec with sense of globus and  throat / chest "congestion" but no excess mucus  Sleeping: on flat bed on side  SABA use: ?  if neb helps more than hfa - not really  Sure but "it sure makes me shake" Still using mint products  rec Symbicort 80  Take 2 puffs first thing in am and then another 2 puffs about 12 hours later.  Pantoprazole (protonix) 40 mg   Take  30-60 min before first meal of the day and Pepcid (famotidine)  20 mg one @  bedtime until return to office - this is the best way to tell whether stomach acid is contributing to your problem.   GERD diet  Try zyrtec 10 mg after supper  Take delsym two tsp every 12 hours and supplement if needed with  vicodin up to 1every 4 hours to suppress the urge to cough. Swallowing water and/or using ice chips/non mint and menthol containing candies (such as lifesavers or sugarless jolly ranchers) are also effective.  You should rest your voice and avoid activities that you know make you cough. Once you have eliminated the cough for 3 straight days try reducing the vicodin first,  then the delsym as tolerated.   On 03/14/18 Prednisone 10 mg take  4 each am x 2 days,   2 each am x 2 days,  1 each am x 2 days and stop  Please schedule a follow up office visit in 2 weeks, sooner if needed  with all medications /inhalers/ solutions in hand so we can verify exactly what you are taking. This includes all medications from all doctors and over the counters    03/26/2018  f/u ov/Sue Mitchell re: refractory cough/ wheeze on symb 80 2bid  Chief Complaint  Patient presents with  . Follow-up    Pt c/o sinus congestion, cough with yellow sputum and wheezing. She is using her albuterol inhaler once per wk on average.   Dyspnea:  Ok unless cough Cough:  at work and at home same severity/ frequency  Sleeping: not coughing while sleeping  SABA use:  Not really helping 02: none  gen ant chest soreness from coughing/ not able to use vicodin daytime to suppress cough rec  Only use your albuterol as a rescue  medication  Please see patient coordinator before you leave today  to schedule sinus CT Please remember to go to the  x-ray department  for your tests - we will call you with the results when they are available  Please schedule a follow up office visit in 2 weeks, sooner if needed  with all medications /inhalers/ solutions in hand so we can verify exactly what you are taking. This includes all medications from all doctors and over the counters - sample of symb 80 plus has 36 left on sample she has       04/08/2018  f/[facility] ov[facility]Sue Mitchell re: refractory cough [facility]   Chief Complaint  Patient presents with  . Acute Visit    Pt has had complaints of sinius drainage and a cough x4 days and also states she has had problems sleeping. Pt also has been hurting everywhere, feeling weak, and has had sweats.  Dyspnea: usually only sob when coughing Cough: white mucus / min production "but feels like it's choking her"  Sleeping: on side bed pillows too heavy for bed blocks/ worse cough now at hs / not using cough suppression as advised  SABA use: rarely / symbicort count on 50 so missed about 14 doses or continued to use beyond the red line on the prior/ warned about both 02: none  Reported  after much back and forth "felt good only while on prednisone" and gradually worse off it  - turns out "felt good" does not mean the cough was gone but breathing was better.  No obvious day to day or daytime variability or assoc excess/ purulent sputum or mucus plugs or hemoptysis or cp or chest tightness, subjective wheeze or overt sinus or hb symptoms.    Also denies any obvious fluctuation of symptoms with weather or environmental changes or other aggravating or alleviating factors except as outlined above   No unusual exposure hx or h/o childhood pna/ asthma or knowledge of premature birth.  Current Allergies, Complete Past Medical History, Past Surgical History, Family History, and Social  History were reviewed in [employer] record.  ROS  The following are not active complaints unless bolded Hoarseness, sore throat, dysphagia, dental problems, itching, sneezing,  nasal congestion or discharge of excess mucus or purulent secretions, ear ache,   fever, chills, sweats, unintended wt loss or wt gain, classically pleuritic or exertional cp,  orthopnea pnd or arm/hand swelling  or leg swelling, presyncope, palpitations, abdominal pain, anorexia, nausea, vomiting, diarrhea  or change in bowel habits or change in bladder habits, change in stools or change in urine, dysuria, hematuria,  rash, arthralgias, visual complaints, headache, numbness, weakness or ataxia or problems with walking or coordination,  change in mood or  memory.        Current Meds - - NOTE:   Unable to verify as accurately reflecting what pt takes     Medication Sig  . albuterol (PROVENTIL HFA;VENTOLIN HFA) 108 (90 BASE) MCG/ACT inhaler Inhale 2 puffs into the lungs every 6 (six) hours as needed for wheezing or shortness of breath.  . budesonide-formoterol (SYMBICORT) 80-4.5 MCG/ACT inhaler Inhale 2 puffs into the lungs every 12 (twelve) hours.  . cetirizine (ZYRTEC) 10 MG tablet Take 10 mg by mouth daily.  . DOCOSAHEXAENOIC ACID PO Take 1 g by mouth daily.  . famotidine (PEPCID) 20 MG tablet One at bedtime  . gabapentin (NEURONTIN) 100 MG capsule Take 1 capsule (100 mg total) by mouth 3 (three) times daily. One three times daily  . irbesartan-hydrochlorothiazide (AVALIDE) 300-12.5 MG tablet Take 0.5 tablets by mouth daily.  . pantoprazole (PROTONIX) 40 MG tablet TAKE 1 TABLET(40 MG) BY MOUTH DAILY 30 TO 60 MINUTES BEFORE FIRST MEAL OF THE DAY                     Objective:   Physical Exam    amb hoarse bf hopeless affect   04/08/2018       164  03/26/2018       173  03/11/2018       173  12/04/2017     170  11/04/2017       169  10/21/2017       169  02/06/2017     171   12/26/16  171 lb 6.4 oz (77.7 kg)  11/18/13 170 lb (77.1 kg)  03/21/11 171 lb (77.6 kg)    Vital signs reviewed - Note on arrival 02 sats  90% on RA        HEENT: nl dentition, turbinates bilaterally, and oropharynx. Nl external ear canals without cough reflex   NECK :  without JVD/Nodes/TM/ nl carotid upstrokes bilaterally   LUNGS: no acc muscle use,  Nl contour chest with insp /exp rhonchi bilaterally and lots of upper airway transmitted noise without cough on insp or exp  maneuvers   CV:  RRR  no s3 or murmur or increase in P2, and no edema   ABD:  soft and nontender with nl inspiratory excursion in the supine position. No bruits or organomegaly appreciated, bowel sounds nl  MS:  Nl gait/ ext warm without deformities, calf tenderness, cyanosis or clubbing No obvious joint restrictions   SKIN: warm and dry without lesions    NEURO:  alert, approp, nl sensorium with  no motor or cerebellar deficits apparent.            Assessment:

## 2018-04-09 ENCOUNTER — Encounter: Payer: Self-pay | Admitting: Internal Medicine

## 2018-04-09 ENCOUNTER — Ambulatory Visit: Payer: BLUE CROSS/BLUE SHIELD | Admitting: Internal Medicine

## 2018-04-09 NOTE — Assessment & Plan Note (Addendum)
Recurrent cough since teenager really bad and daily since Aug 2018 12/26/2016  rec symbicort 80 2bid then return for full pfts - PFT's  02/06/2017  FEV1 1.37 (80 % ) ratio 67  p 5 % improvement from saba p symb 80  prior to study with DLCO  66/66c % corrects to 79  % for alv volume   - Spirometry 10/21/2017  FEV1 1.18 (70%)  Ratio 71 with min curvature during attack of cough/ mostly pseudowheeze on exam on symb 80 x2  - FENO 10/21/2017  =   7  On symb 80 x 2  - 11/04/2017     90% with spacer  - singulair added 11/04/2017  But not taking consistently as of 12/04/2017 > rec restart daily  X one week and stopped it due "dizzy"  - PFT's  12/04/2017  FEV1 1.34 (85 % ) ratio 67  p 20 % improvement from saba p nothing prior to study with DLCO  71 % corrects to 80  % for alv volume   - Allergy profile 12/04/2017 >  Eos 0.0 /  IgE  546 dust only  - cyclical cough rx 03/11/2018  - Spirometry 03/26/2018  FEV1 1.1 (73%)  Ratio 0.64 - 03/26/2018  After extensive coaching inhaler device,  effectiveness =    90% s spacer  - Gabapentin 100 mg tid trial 03/26/2018 poorly tol  - Sinus CT 04/07/2018 > Negative study.  No evidence of paranasal sinusitis. - Spirometry 04/08/2018  FEV1 0.7 (47%)  Ratio 0.52 with convex curvature p am symb 80 x 2 and neb in office   - 04/08/2018  After extensive coaching inhaler device,  effectiveness =    75% with smi > added trial of spiriva 2pffs daily x 2 weeks then return    Strongly suspect multiple different issues leading to poor airway control  1) her numbers on symb do not add up to 100% aderence 2)can't use higher strength symb s risking aggravating UACS/ coughing fits 3) Prednisone and spiriva least likely to add to cough so rec 12 days of pred to see her "when she's good" (which I have not personally ever witnessed since Aug 2018)  4) max rx for gerd and 1st gen H1 blockers per guidelines   5) control cough with gabapentin try bid if tol and supplement with  vicodin but bring all  bottles to office as req or won't be able to use this strategy in office setting  6) to er if worse   I had an extended discussion with the patient reviewing all relevant studies completed to date and  lasting 25 minutes of a 40  minute  visit addressing    re  severe non-specific but potentially very serious refractory respiratory symptoms of uncertain and potentially multiple  Etiologies.  See device teaching which extended face to face time for this visit    Each maintenance medication was reviewed in detail including most importantly the difference between maintenance and prns and under what circumstances the prns are to be triggered using an action plan format that is not reflected in the computer generated alphabetically organized AVS.    Please see AVS for specific instructions unique to this office visit that I personally wrote and verbalized to the the pt in detail and then reviewed with pt  by my nurse highlighting any changes in therapy/plan of care  recommended at today's visit.

## 2018-04-11 ENCOUNTER — Other Ambulatory Visit: Payer: Self-pay

## 2018-04-11 ENCOUNTER — Observation Stay (HOSPITAL_COMMUNITY): Payer: BLUE CROSS/BLUE SHIELD

## 2018-04-11 ENCOUNTER — Telehealth: Payer: Self-pay | Admitting: Pulmonary Disease

## 2018-04-11 ENCOUNTER — Emergency Department (HOSPITAL_COMMUNITY): Payer: BLUE CROSS/BLUE SHIELD

## 2018-04-11 ENCOUNTER — Inpatient Hospital Stay (HOSPITAL_COMMUNITY)
Admission: EM | Admit: 2018-04-11 | Discharge: 2018-04-14 | DRG: 193 | Disposition: A | Discharge status: Home Health | Payer: BLUE CROSS/BLUE SHIELD | Attending: Internal Medicine | Admitting: Internal Medicine

## 2018-04-11 ENCOUNTER — Encounter (HOSPITAL_COMMUNITY): Payer: Self-pay

## 2018-04-11 DIAGNOSIS — Z79890 Hormone replacement therapy (postmenopausal): Secondary | ICD-10-CM | POA: Diagnosis not present

## 2018-04-11 DIAGNOSIS — Z79899 Other long term (current) drug therapy: Secondary | ICD-10-CM | POA: Diagnosis not present

## 2018-04-11 DIAGNOSIS — J101 Influenza due to other identified influenza virus with other respiratory manifestations: Secondary | ICD-10-CM | POA: Diagnosis not present

## 2018-04-11 DIAGNOSIS — Z885 Allergy status to narcotic agent status: Secondary | ICD-10-CM

## 2018-04-11 DIAGNOSIS — R059 Cough, unspecified: Secondary | ICD-10-CM

## 2018-04-11 DIAGNOSIS — J45991 Cough variant asthma: Secondary | ICD-10-CM | POA: Diagnosis not present

## 2018-04-11 DIAGNOSIS — E876 Hypokalemia: Secondary | ICD-10-CM | POA: Diagnosis not present

## 2018-04-11 DIAGNOSIS — R197 Diarrhea, unspecified: Secondary | ICD-10-CM | POA: Diagnosis present

## 2018-04-11 DIAGNOSIS — R042 Hemoptysis: Secondary | ICD-10-CM

## 2018-04-11 DIAGNOSIS — G4700 Insomnia, unspecified: Secondary | ICD-10-CM | POA: Diagnosis not present

## 2018-04-11 DIAGNOSIS — J45901 Unspecified asthma with (acute) exacerbation: Secondary | ICD-10-CM | POA: Diagnosis present

## 2018-04-11 DIAGNOSIS — J111 Influenza due to unidentified influenza virus with other respiratory manifestations: Secondary | ICD-10-CM | POA: Diagnosis not present

## 2018-04-11 DIAGNOSIS — Z6837 Body mass index (BMI) 37.0-37.9, adult: Secondary | ICD-10-CM | POA: Diagnosis not present

## 2018-04-11 DIAGNOSIS — R0902 Hypoxemia: Secondary | ICD-10-CM | POA: Diagnosis not present

## 2018-04-11 DIAGNOSIS — I10 Essential (primary) hypertension: Secondary | ICD-10-CM | POA: Diagnosis not present

## 2018-04-11 DIAGNOSIS — K219 Gastro-esophageal reflux disease without esophagitis: Secondary | ICD-10-CM | POA: Diagnosis present

## 2018-04-11 DIAGNOSIS — E669 Obesity, unspecified: Secondary | ICD-10-CM | POA: Diagnosis present

## 2018-04-11 DIAGNOSIS — J9601 Acute respiratory failure with hypoxia: Secondary | ICD-10-CM | POA: Diagnosis not present

## 2018-04-11 DIAGNOSIS — R000 Tachycardia, unspecified: Secondary | ICD-10-CM | POA: Diagnosis not present

## 2018-04-11 DIAGNOSIS — Z87891 Personal history of nicotine dependence: Secondary | ICD-10-CM | POA: Diagnosis not present

## 2018-04-11 DIAGNOSIS — F419 Anxiety disorder, unspecified: Secondary | ICD-10-CM | POA: Diagnosis not present

## 2018-04-11 DIAGNOSIS — R05 Cough: Secondary | ICD-10-CM | POA: Diagnosis not present

## 2018-04-11 DIAGNOSIS — Z7952 Long term (current) use of systemic steroids: Secondary | ICD-10-CM

## 2018-04-11 DIAGNOSIS — R0602 Shortness of breath: Secondary | ICD-10-CM | POA: Diagnosis not present

## 2018-04-11 DIAGNOSIS — J449 Chronic obstructive pulmonary disease, unspecified: Secondary | ICD-10-CM | POA: Diagnosis not present

## 2018-04-11 HISTORY — DX: Unspecified asthma, uncomplicated: J45.909

## 2018-04-11 HISTORY — DX: Gastro-esophageal reflux disease without esophagitis: K21.9

## 2018-04-11 HISTORY — DX: Chronic obstructive pulmonary disease, unspecified: J44.9

## 2018-04-11 LAB — CBC
HCT: 38.7 % (ref 36.0–46.0)
Hemoglobin: 12.3 g/dL (ref 12.0–15.0)
MCH: 30 pg (ref 26.0–34.0)
MCHC: 31.8 g/dL (ref 30.0–36.0)
MCV: 94.4 fL (ref 80.0–100.0)
Platelets: 166 10*3/uL (ref 150–400)
RBC: 4.1 MIL/uL (ref 3.87–5.11)
RDW: 13.6 % (ref 11.5–15.5)
WBC: 10.8 10*3/uL — ABNORMAL HIGH (ref 4.0–10.5)
nRBC: 0 % (ref 0.0–0.2)

## 2018-04-11 LAB — BASIC METABOLIC PANEL
Anion gap: 10 (ref 5–15)
BUN: 13 mg/dL (ref 8–23)
CO2: 28 mmol/L (ref 22–32)
Calcium: 8.9 mg/dL (ref 8.9–10.3)
Chloride: 100 mmol/L (ref 98–111)
Creatinine, Ser: 0.86 mg/dL (ref 0.44–1.00)
GFR calc Af Amer: >60 mL/min (ref >60)
GFR calc non Af Amer: >60 mL/min (ref >60)
Glucose, Bld: 107 mg/dL — ABNORMAL HIGH (ref 70–99)
Potassium: 3.1 mmol/L — ABNORMAL LOW (ref 3.5–5.1)
Sodium: 138 mmol/L (ref 135–145)

## 2018-04-11 LAB — TROPONIN I: Troponin I: <0.03 ng/mL (ref <0.03)

## 2018-04-11 LAB — INFLUENZA PANEL BY PCR (TYPE A & B)
Influenza A By PCR: POSITIVE — AB
Influenza B By PCR: NEGATIVE

## 2018-04-11 MED ORDER — GABAPENTIN 100 MG PO CAPS
100.0000 mg | ORAL_CAPSULE | Freq: Two times a day (BID) | ORAL | Status: DC
  Administered 2018-04-11 – 2018-04-14 (×4): 100 mg via ORAL
  Filled 2018-04-11 (×5): qty 1

## 2018-04-11 MED ORDER — ACETAMINOPHEN 325 MG PO TABS
650.0000 mg | ORAL_TABLET | Freq: Four times a day (QID) | ORAL | Status: DC | PRN
  Administered 2018-04-11: 650 mg via ORAL
  Filled 2018-04-11: qty 2

## 2018-04-11 MED ORDER — GUAIFENESIN-DM 100-10 MG/5ML PO SYRP
5.0000 mL | ORAL_SOLUTION | ORAL | Status: DC | PRN
  Administered 2018-04-11 – 2018-04-12 (×3): 5 mL via ORAL
  Filled 2018-04-11 (×3): qty 10

## 2018-04-11 MED ORDER — METHYLPREDNISOLONE SODIUM SUCC 125 MG IJ SOLR: 125.0000 mg | Freq: Once | INTRAMUSCULAR | Status: DC

## 2018-04-11 MED ORDER — IPRATROPIUM-ALBUTEROL 0.5-2.5 (3) MG/3ML IN SOLN
3.0000 mL | Freq: Once | RESPIRATORY_TRACT | Status: AC
  Administered 2018-04-11: 3 mL via RESPIRATORY_TRACT
  Filled 2018-04-11: qty 3

## 2018-04-11 MED ORDER — POTASSIUM CHLORIDE CRYS ER 20 MEQ PO TBCR
40.0000 meq | EXTENDED_RELEASE_TABLET | Freq: Once | ORAL | Status: AC
  Administered 2018-04-11: 40 meq via ORAL
  Filled 2018-04-11: qty 2

## 2018-04-11 MED ORDER — ALBUTEROL SULFATE (2.5 MG/3ML) 0.083% IN NEBU
2.5000 mg | INHALATION_SOLUTION | RESPIRATORY_TRACT | Status: DC | PRN
  Administered 2018-04-11: 2.5 mg via RESPIRATORY_TRACT
  Filled 2018-04-11: qty 3

## 2018-04-11 MED ORDER — PANTOPRAZOLE SODIUM 40 MG PO TBEC
40.0000 mg | DELAYED_RELEASE_TABLET | Freq: Two times a day (BID) | ORAL | Status: DC
  Administered 2018-04-12 – 2018-04-14 (×5): 40 mg via ORAL
  Filled 2018-04-11 (×5): qty 1

## 2018-04-11 MED ORDER — PREDNISONE 20 MG PO TABS
40.0000 mg | ORAL_TABLET | Freq: Every day | ORAL | Status: DC
  Administered 2018-04-12 – 2018-04-14 (×3): 40 mg via ORAL
  Filled 2018-04-11 (×3): qty 2

## 2018-04-11 MED ORDER — IPRATROPIUM-ALBUTEROL 0.5-2.5 (3) MG/3ML IN SOLN
3.0000 mL | Freq: Four times a day (QID) | RESPIRATORY_TRACT | Status: DC
  Administered 2018-04-11: 3 mL via RESPIRATORY_TRACT
  Filled 2018-04-11: qty 3

## 2018-04-11 MED ORDER — OXYCODONE HCL 5 MG PO TABS
5.0000 mg | ORAL_TABLET | ORAL | Status: DC | PRN
  Administered 2018-04-11 (×2): 5 mg via ORAL
  Filled 2018-04-11 (×3): qty 1

## 2018-04-11 MED ORDER — IOPAMIDOL (ISOVUE-370) INJECTION 76%
INTRAVENOUS | Status: AC
  Filled 2018-04-11: qty 100

## 2018-04-11 MED ORDER — ACETAMINOPHEN 650 MG RE SUPP: 650.0000 mg | Freq: Four times a day (QID) | RECTAL | Status: DC | PRN

## 2018-04-11 MED ORDER — IOPAMIDOL (ISOVUE-370) INJECTION 76%
100.0000 mL | Freq: Once | INTRAVENOUS | Status: AC | PRN
  Administered 2018-04-11: 100 mL via INTRAVENOUS

## 2018-04-11 MED ORDER — POLYETHYLENE GLYCOL 3350 17 G PO PACK
17.0000 g | PACK | Freq: Every day | ORAL | Status: DC | PRN
  Administered 2018-04-12: 17 g via ORAL
  Filled 2018-04-11: qty 1

## 2018-04-11 MED ORDER — TRAZODONE HCL 50 MG PO TABS
50.0000 mg | ORAL_TABLET | Freq: Once | ORAL | Status: AC
  Administered 2018-04-12: 50 mg via ORAL
  Filled 2018-04-11: qty 1

## 2018-04-11 MED ORDER — LACTATED RINGERS IV SOLN
INTRAVENOUS | Status: DC
  Administered 2018-04-11 – 2018-04-12 (×2): via INTRAVENOUS

## 2018-04-11 MED ORDER — SODIUM CHLORIDE (PF) 0.9 % IJ SOLN
INTRAMUSCULAR | Status: AC
  Filled 2018-04-11: qty 50

## 2018-04-11 MED ORDER — OSELTAMIVIR PHOSPHATE 30 MG PO CAPS
30.0000 mg | ORAL_CAPSULE | Freq: Two times a day (BID) | ORAL | Status: DC
  Administered 2018-04-11 – 2018-04-13 (×6): 30 mg via ORAL
  Filled 2018-04-11 (×7): qty 1

## 2018-04-11 NOTE — H&P (Signed)
History and Physical    Sue Mitchell WGN:[id number] DOB: 10-17-1955 DOA: 04/11/2018  PCP: Renaye Rakers, MD  Patient coming from: home  I have personally briefly reviewed patient's old medical records in [facility] Link  Chief Complaint: hemoptysis  HPI: Sue Mitchell is Sue Mitchell 63 y.o. female with medical history significant of asthma, hypertension, reflux, chronic cough presenting with continued cough, malaise, and hemoptysis.   Symptoms started around Sunday.  She notes that she noticed runny nose, and more frequent coughing.  On Tuesday her symptoms got worse and she describes general malaise with coughing now productive of white phlegm.  She saw her pulmonologist this past week who put her on Raveena Hebdon steroid taper and started Kelyn Ponciano PPI for reflux.  Since begun coughing up some blood.  This was described as occurring 2 times, may be Jeania Nater few teaspoons.  She denies fevers, notes chills.  She denies chest pain, abdominal pain, nausea or vomiting.  She notes some diarrhea that's been off/on, not sure when it started.  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  ED Course: Labs, CXR.  Admit for influenza.  Review of Systems: As per HPI otherwise 10 point review of systems negative.   Past Medical History:  Diagnosis Date  . Abdominal distension   . Abdominal pain   . Arthritis   . Asthma   . Biliary colic   . COPD (chronic obstructive pulmonary disease) (HCC)   . Diarrhea   . GERD (gastroesophageal reflux disease)   . Hypertension   . Nausea     Past Surgical History:  Procedure Laterality Date  . cartilage removal  2007   left knee  . CESAREAN SECTION  1976  . LAPAROSCOPY  1992 (approx)    abdominal     reports that she quit smoking about 9 years ago. Her smoking use included cigarettes. She has Moncia Annas 12.50 pack-year smoking history. She has never used smokeless tobacco. She reports current alcohol use. She reports that she does not use drugs.  Allergies  Allergen Reactions  . Codeine Hives, Itching  and Other (See Comments)    All over the body, including burning sensation.  . Ciprofloxacin Palpitations    Family History  Problem Relation Age of Onset  . Diabetes Father    Prior to Admission medications   Medication Sig Start Date End Date Taking? Authorizing Provider  acyclovir (ZOVIRAX) 200 MG capsule Take 200 mg by mouth 2 (two) times daily as needed (outbreaks).  01/22/11  Yes [provider]  albuterol (PROVENTIL HFA;VENTOLIN HFA) 108 (90 BASE) MCG/ACT inhaler Inhale 2 puffs into the lungs every 6 (six) hours as needed for wheezing or shortness of breath. 02/21/13  Yes Rodolph Bong, MD  budesonide-formoterol [facility]) 80-4.5 MCG/ACT inhaler Inhale 2 puffs into the lungs 2 (two) times daily for 1 day. 04/08/18 04/11/18 Yes Nyoka Cowden, MD  chlorpheniramine (CHLOR-TRIMETON) 4 MG tablet Take 4 mg by mouth at bedtime.   Yes [provider]  gabapentin (NEURONTIN) 100 MG capsule Take 100 mg by mouth 2 (two) times daily.   Yes [provider]  HYDROcodone-acetaminophen (NORCO/VICODIN) 5-325 MG tablet Take 1 tablet by mouth every 6 (six) hours as needed for moderate pain. 04/08/18  Yes Nyoka Cowden, MD  irbesartan-hydrochlorothiazide (AVALIDE) 300-12.5 MG tablet Take 1 tablet by mouth daily.  12/19/16  Yes [provider]  pantoprazole (PROTONIX) 40 MG tablet Take 30- 60 min before your first and last meals of the day Patient taking differently: Take  40 mg by mouth 2 (two) times daily before Shakhia Gramajo meal.  04/08/18  Yes Nyoka Cowden, MD  predniSONE (DELTASONE) 10 MG tablet Take 4 for three days 3 for three days 2 for three days 1 for three days and stop Patient taking differently: Take 10-40 mg by mouth See admin instructions. Take 40 mg by mouth daily for 3 days, then 30 mg daily for 3 days, then 20 mg for 3 days and 10 mg for 3 days and stop 04/08/18  Yes Nyoka Cowden, MD  Tiotropium Bromide Monohydrate (SPIRIVA RESPIMAT) 2.5 MCG/ACT AERS Inhale 2  puffs into the lungs daily. 04/08/18  Yes Nyoka Cowden, MD  famotidine (PEPCID) 20 MG tablet One at bedtime Patient not taking: Reported on 04/11/2018 03/12/18   Nyoka Cowden, MD    Physical Exam: Vitals:   04/11/18 1315 04/11/18 1330 04/11/18 1431 04/11/18 1558  BP:  132/62 134/85 131/81  Pulse: 66 80 68 74  Resp: 19 (!) 30 20   Temp:    98.2 F (36.8 C)  TempSrc:    Oral  SpO2: 98% 100% 97% 96%  Weight:    76.3 kg  Height:    4\' 10"  (1.473 m)    Constitutional: NAD, calm, comfortable Vitals:   04/11/18 1315 04/11/18 1330 04/11/18 1431 04/11/18 1558  BP:  132/62 134/85 131/81  Pulse: 66 80 68 74  Resp: 19 (!) 30 20   Temp:    98.2 F (36.8 C)  TempSrc:    Oral  SpO2: 98% 100% 97% 96%  Weight:    76.3 kg  Height:    4\' 10"  (1.473 m)   Eyes: PERRL, lids and conjunctivae normal ENMT: Mucous membranes are moist. Posterior pharynx clear of any exudate or lesions. Normal dentition.  Neck: normal, supple, no masses, no thyromegaly Respiratory: tight cough with wheeze, mildly increased WOB, moving air throughout  Cardiovascular: Regular rate and rhythm, no murmurs / rubs / gallops. No extremity edema. 2+ pedal pulses. No carotid bruits.  Abdomen: no tenderness, no masses palpated. No hepatosplenomegaly. Bowel sounds positive.  Musculoskeletal: no clubbing / cyanosis. No joint deformity upper and lower extremities. Good ROM, no contractures. Normal muscle tone.  Skin: no rashes, lesions, ulcers. No induration Neurologic: CN 2-12 grossly intact. Sensation intact Psychiatric: Normal judgment and insight. Alert and oriented x 3. Normal mood.   Labs on Admission: I have personally reviewed following labs and imaging studies  CBC: Recent Labs  Lab 04/11/18 1151  WBC 10.8*  HGB 12.3  HCT 38.7  MCV 94.4  PLT 166   Basic Metabolic Panel: Recent Labs  Lab 04/11/18 1151  NA 138  K 3.1*  CL 100  CO2 28  GLUCOSE 107*  BUN 13  CREATININE 0.86  CALCIUM 8.9    GFR: Estimated Creatinine Clearance: 59 mL/min (by C-G formula based on SCr of 0.86 mg/dL). Liver Function Tests: No results for input(s): AST, ALT, ALKPHOS, BILITOT, PROT, ALBUMIN in the last 168 hours. No results for input(s): LIPASE, AMYLASE in the last 168 hours. No results for input(s): AMMONIA in the last 168 hours. Coagulation Profile: No results for input(s): INR, PROTIME in the last 168 hours. Cardiac Enzymes: Recent Labs  Lab 04/11/18 1151  TROPONINI <0.03   BNP (last 3 results) No results for input(s): PROBNP in the last 8760 hours. HbA1C: No results for input(s): HGBA1C in the last 72 hours. CBG: No results for input(s): GLUCAP in the last 168 hours. Lipid Profile: No  results for input(s): CHOL, HDL, LDLCALC, TRIG, CHOLHDL, LDLDIRECT in the last 72 hours. Thyroid Function Tests: No results for input(s): TSH, T4TOTAL, FREET4, T3FREE, THYROIDAB in the last 72 hours. Anemia Panel: No results for input(s): VITAMINB12, FOLATE, FERRITIN, TIBC, IRON, RETICCTPCT in the last 72 hours. Urine analysis:    Component Value Date/Time   LABSPEC 1.015 06/10/2013 1118   PHURINE 7.0 06/10/2013 1118   GLUCOSEU NEGATIVE 06/10/2013 1118   HGBUR TRACE (Tion Tse) 06/10/2013 1118   BILIRUBINUR NEGATIVE 06/10/2013 1118   KETONESUR NEGATIVE 06/10/2013 1118   PROTEINUR NEGATIVE 06/10/2013 1118   UROBILINOGEN 0.2 06/10/2013 1118   NITRITE NEGATIVE 06/10/2013 1118   LEUKOCYTESUR NEGATIVE 06/10/2013 1118    Radiological Exams on Admission: Dg Chest 2 View  Result Date: 04/11/2018 CLINICAL DATA:  Pt c/o chronic cough x 1 year, hemoptysis onset this Aarika Moon.m. that was dark red. H/o HTN and Asthma. EXAM: CHEST - 2 VIEW COMPARISON:  03/26/2018 FINDINGS: The heart size and mediastinal contours are within normal limits. Both lungs are clear. No pleural effusion or pneumothorax. The visualized skeletal structures are unremarkable. IMPRESSION: No active cardiopulmonary disease. Electronically Signed   By:  Amie Portland M.D.   On: 04/11/2018 12:27   Ct Angio Chest Pe W Or Wo Contrast  Result Date: 04/11/2018 CLINICAL DATA:  Hemoptysis. EXAM: CT ANGIOGRAPHY CHEST WITH CONTRAST TECHNIQUE: Multidetector CT imaging of the chest was performed using the standard protocol during bolus administration of intravenous contrast. Multiplanar CT image reconstructions and MIPs were obtained to evaluate the vascular anatomy. CONTRAST:  ISOVUE-370 IOPAMIDOL (ISOVUE-370) INJECTION 76% COMPARISON:  Chest x-ray earlier today. FINDINGS: Cardiovascular: There is respiratory motion on the study and suboptimal opacification of segmental and subsegmental pulmonary artery branches. No obvious pulmonary embolism. Central pulmonary arteries are normal in caliber. The thoracic aorta is normal in caliber. The heart size is normal. No pericardial fluid identified. No significant calcified coronary artery plaque identified. Mediastinum/Nodes: No enlarged mediastinal, hilar, or axillary lymph nodes. The thyroid gland may be mildly enlarged. Lungs/Pleura: Detailed evaluation of the lungs is impaired by respiratory motion. There is no obvious evidence of pulmonary edema, consolidation, pneumothorax, nodule or pleural fluid. Upper Abdomen: No acute abnormality. Musculoskeletal: No chest wall abnormality. No acute or significant osseous findings. Review of the MIP images confirms the above findings. IMPRESSION: Limited study due to respiratory motion. No obvious pulmonary embolism or other acute findings. Electronically Signed   By: Irish Lack M.D.   On: 04/11/2018 15:20    EKG: Independently reviewed. none  Assessment/Plan Active Problems:   Influenza  Influenza: Tamiflu, renally adjusted, she's at borderline and dose may be able to be increased, follow  Acute Hypoxic Respiratory Failure  Cough Variant Asthma? In setting of influenza above Continue prednisone (has some wheezing on exam) Scheduled and prn nebs Wean O2 as  tolerated Pt on symbicort and spiriva at home Follows with Dr. [name] as outpatient  Hemoptysis: 2 episodes, both enough to cover bottom of cup.  CTA limited due to respiratory motion, but without obvious PE or other acute findings.  Diarrhea: she notes this has been on/off, not sure when it started.  Follow inpatient, if significant, will need further w/u  Hypokalemia:  Replace, follow  Hypertension;  Currently holding irbesartan-HCTZ  DVT prophylaxis: SCD Code Status: full Family Communication: none at bedside Disposition Plan: pending further improvement in respiratory status  Consults called: none  Admission status: observation    Lacretia Nicks MD [facility] Pager AMION  If 7PM-7AM,  please contact night-coverage [email or web address] Password [email or web address]  04/11/2018, 6:38 PM

## 2018-04-11 NOTE — ED Triage Notes (Signed)
Patient c/o a productive cough with tan sputum and streaked with blood and  increased SOB this AM. Patient states she was seen last week and has been in bed. Patient has a history of asthma and COPD. Room air sats 91% in triage. Patient placed on O2 2L/min via [place] and sats increased to 99%

## 2018-04-11 NOTE — ED Provider Notes (Signed)
[facility] Emergency Department Provider Note MRN:  [id number]  Arrival date & time: 04/11/18     Chief Complaint   Shortness of Breath and Cough   History of Present Illness   Sue Mitchell is a 63 y.o. year-old female with a history of cough variant asthma presenting to the ED with chief complaint of cough.  General malaise, cough, shortness of breath since yesterday evening.  Has never felt this sick before.  Is being followed by pulmonology for working diagnosis of cough variant asthma, recently placed on prednisone burst a few days ago.  Denies fever, no chest pain, no abdominal pain.  Becomes very short of breath with coughing spells.  Some blood in her sputum today.  Review of Systems  A complete 10 system review of systems was obtained and all systems are negative except as noted in the HPI and PMH.   Patient's Health History    Past Medical History:  Diagnosis Date  . Abdominal distension   . Abdominal pain   . Arthritis   . Asthma   . Biliary colic   . COPD (chronic obstructive pulmonary disease) (HCC)   . Diarrhea   . GERD (gastroesophageal reflux disease)   . Hypertension   . Nausea     Past Surgical History:  Procedure Laterality Date  . cartilage removal  2007   left knee  . CESAREAN SECTION  1976  . LAPAROSCOPY  1992 (approx)    abdominal    Family History  Problem Relation Age of Onset  . Diabetes Father     Social History   Socioeconomic History  . Marital status: Divorced    Spouse name: Not on file  . Number of children: Not on file  . Years of education: Not on file  . Highest education level: Not on file  Occupational History  . Not on file  Social Needs  . Financial resource strain: Not on file  . Food insecurity:    Worry: Not on file    Inability: Not on file  . Transportation needs:    Medical: Not on file    Non-medical: Not on file  Tobacco Use  . Smoking status: Former Smoker    Packs/day: 0.50   Years: 25.00    Pack years: 12.50    Types: Cigarettes    Last attempt to quit: 2011    Years since quitting: 9.1  . Smokeless tobacco: Never Used  Substance and Sexual Activity  . Alcohol use: Yes    Comment: occasional 2 per month  . Drug use: No  . Sexual activity: Yes  Lifestyle  . Physical activity:    Days per week: Not on file    Minutes per session: Not on file  . Stress: Not on file  Relationships  . Social connections:    Talks on phone: Not on file    Gets together: Not on file    Attends religious service: Not on file    Active member of club or organization: Not on file    Attends meetings of clubs or organizations: Not on file    Relationship status: Not on file  . Intimate partner violence:    Fear of current or ex partner: Not on file    Emotionally abused: Not on file    Physically abused: Not on file    Forced sexual activity: Not on file  Other Topics Concern  . Not on file  Social History Narrative  .  Not on file     Physical Exam  Vital Signs and Nursing Notes reviewed Vitals:   04/11/18 1431 04/11/18 1558  BP: 134/85 131/81  Pulse: 68 74  Resp: 20   Temp:  98.2 F (36.8 C)  SpO2: 97% 96%    CONSTITUTIONAL: Well-appearing, NAD NEURO:  Alert and oriented x 3, no focal deficits EYES:  eyes equal and reactive ENT/NECK:  no LAD, no JVD CARDIO: Regular rate, well-perfused, normal S1 and S2 PULM: Scattered wheezing, faint GI/GU:  normal bowel sounds, non-distended, non-tender MSK/SPINE:  No gross deformities, no edema SKIN:  no rash, atraumatic PSYCH:  Appropriate speech and behavior  Diagnostic and Interventional Summary    Labs Reviewed  CBC - Abnormal; Notable for the following components:      Result Value   WBC 10.8 (*)    All other components within normal limits  BASIC METABOLIC PANEL - Abnormal; Notable for the following components:   Potassium 3.1 (*)    Glucose, Bld 107 (*)    All other components within normal limits    INFLUENZA PANEL BY PCR (TYPE A & B) - Abnormal; Notable for the following components:   Influenza A By PCR POSITIVE (*)    All other components within normal limits  TROPONIN I  HIV ANTIBODY (ROUTINE TESTING W REFLEX)  COMPREHENSIVE METABOLIC PANEL  CBC    CT ANGIO CHEST PE W OR WO CONTRAST  Final Result    DG Chest 2 View  Final Result      Medications  oseltamivir (TAMIFLU) capsule 30 mg (30 mg Oral Given 04/11/18 1431)  acetaminophen (TYLENOL) tablet 650 mg (650 mg Oral Given 04/11/18 1600)    Or  acetaminophen (TYLENOL) suppository 650 mg ( Rectal See Alternative 04/11/18 1600)  oxyCODONE (Oxy IR/ROXICODONE) immediate release tablet 5 mg (5 mg Oral Given 04/11/18 1600)  ipratropium-albuterol (DUONEB) 0.5-2.5 (3) MG/3ML nebulizer solution 3 mL (3 mLs Nebulization Not Given 04/11/18 1614)  albuterol (PROVENTIL) (2.5 MG/3ML) 0.083% nebulizer solution 2.5 mg (has no administration in time range)  polyethylene glycol (MIRALAX / GLYCOLAX) packet 17 g (has no administration in time range)  lactated ringers infusion ( Intravenous New Bag/Given 04/11/18 1559)  sodium chloride (PF) 0.9 % injection (has no administration in time range)  iopamidol (ISOVUE-370) 76 % injection (has no administration in time range)  guaiFENesin-dextromethorphan (ROBITUSSIN DM) 100-10 MG/5ML syrup 5 mL (5 mLs Oral Given 04/11/18 1600)  ipratropium-albuterol (DUONEB) 0.5-2.5 (3) MG/3ML nebulizer solution 3 mL (3 mLs Nebulization Given 04/11/18 1149)  ipratropium-albuterol (DUONEB) 0.5-2.5 (3) MG/3ML nebulizer solution 3 mL (3 mLs Nebulization Given 04/11/18 1148)  iopamidol (ISOVUE-370) 76 % injection 100 mL (100 mLs Intravenous Contrast Given 04/11/18 1501)     Procedures Critical Care  ED Course and Medical Decision Making  I have reviewed the triage vital signs and the nursing notes.  Pertinent labs & imaging results that were available during my care of the patient were reviewed by me and considered in my  medical decision making (see below for details).  Working diagnosis of cough variant asthma, per chart review has obstructive lung pattern on recent pulmonary function tests.  Upon my initial evaluation, patient was with persistent coughing with a pulse ox of 82% with good waveform, I placed her on 2 L nasal cannula with improvement.  Considering viral illness, possibly flu, also considering pneumonia, chronic cough with bronchitis being the most likely etiology of patient's blood in sputum but also considering pulmonary embolism but felt to  be less likely given her lack of risk factors, no evidence of DVT.  Initial work-up pending, will consider CT of the chest if unable to find underlying cause.  Flu positive, patient had a hypoxic episode while ambulating, admitted to hospital service for further care.  Pulmonary embolism thought to be unlikely at this time given the infectious symptoms, CT imaging deferred.  Elmer Sow. Pilar Plate, MD [facility] Emergency Medicine [facility] [email or web address]  Final Clinical Impressions(s) / ED Diagnoses     ICD-10-CM   1. Influenza A J10.1   2. Cough R05 DG Chest 2 View    DG Chest 2 View  3. Hypoxia R09.02     ED Discharge Orders    None         Sabas Sous, MD 04/11/18 1702

## 2018-04-11 NOTE — ED Notes (Signed)
ED TO INPATIENT HANDOFF REPORT  Name/Age/Gender Sue Mitchell 63 y.o. female  Code Status    Code Status Orders  (From admission, onward)         Start     Ordered   04/11/18 1433  Full code  Continuous     04/11/18 1432        Code Status History    This patient has a current code status but no historical code status.      Home/SNF/Other Home  Chief Complaint SHoB, coughing blood  Level of Care/Admitting Diagnosis ED Disposition    ED Disposition Condition Comment   Admit  Hospital Area: [facility] [[id number]]  Level of Care: Med-Surg [16]  Diagnosis: Influenza [333545]  Admitting Physician: Zigmund Daniel [phone]  Attending Physician: Shaune Spittle, A CALDWELL [phone]  PT Class (Do Not Modify): Observation [104]  PT Acc Code (Do Not Modify): Observation [10022]       Medical History Past Medical History:  Diagnosis Date  . Abdominal distension   . Abdominal pain   . Arthritis   . Asthma   . Biliary colic   . COPD (chronic obstructive pulmonary disease) (HCC)   . Diarrhea   . GERD (gastroesophageal reflux disease)   . Hypertension   . Nausea     Allergies Allergies  Allergen Reactions  . Codeine Hives, Itching and Other (See Comments)    All over the body, including burning sensation.  . Ciprofloxacin Palpitations    IV Location/Drains/Wounds Patient Lines/Drains/Airways Status   Active Line/Drains/Airways    Name:   Placement date:   Placement time:   Site:   Days:   Peripheral IV 04/11/18 Left Antecubital   04/11/18    1150    Antecubital   less than 1          Labs/Imaging Results for orders placed or performed during the hospital encounter of 04/11/18 (from the past 48 hour(s))  CBC     Status: Abnormal   Collection Time: 04/11/18 11:51 AM  Result Value Ref Range   WBC 10.8 (H) 4.0 - 10.5 K/uL   RBC 4.10 3.87 - 5.11 MIL/uL   Hemoglobin 12.3 12.0 - 15.0 g/dL   HCT 87.6 81.1 - 57.2 %   MCV 94.4 80.0 -  100.0 fL   MCH 30.0 26.0 - 34.0 pg   MCHC 31.8 30.0 - 36.0 g/dL   RDW 62.0 35.5 - 97.4 %   Platelets 166 150 - 400 K/uL   nRBC 0.0 0.0 - 0.2 %    Comment: Performed at [facility], [address] [place], [place] [address]  Basic metabolic panel     Status: Abnormal   Collection Time: 04/11/18 11:51 AM  Result Value Ref Range   Sodium 138 135 - 145 mmol/L   Potassium 3.1 (L) 3.5 - 5.1 mmol/L   Chloride 100 98 - 111 mmol/L   CO2 28 22 - 32 mmol/L   Glucose, Bld 107 (H) 70 - 99 mg/dL   BUN 13 8 - 23 mg/dL   Creatinine, Ser 5.36 0.44 - 1.00 mg/dL   Calcium 8.9 8.9 - 46.8 mg/dL   GFR calc non Af Amer >60 >60 mL/min   GFR calc Af Amer >60 >60 mL/min   Anion gap 10 5 - 15    Comment: Performed at [facility], [address], [place], [place] [address]  Troponin I - ONCE - STAT  Status: None   Collection Time: 04/11/18 11:51 AM  Result Value Ref Range   Troponin I <0.03 <0.03 ng/mL    Comment: Performed at [facility], [address], [place], [place] [address]  Influenza panel by PCR (type A & B)     Status: Abnormal   Collection Time: 04/11/18 11:51 AM  Result Value Ref Range   Influenza A By PCR POSITIVE (A) NEGATIVE   Influenza B By PCR NEGATIVE NEGATIVE    Comment: (NOTE) The Xpert Xpress Flu assay is intended as an aid in the diagnosis of  influenza and should not be used as a sole basis for treatment.  This  assay is FDA approved for nasopharyngeal swab specimens only. Nasal  washings and aspirates are unacceptable for Xpert Xpress Flu testing. Performed at [facility], [address], [place], [place] [address]    Dg Chest 2 View  Result Date: 04/11/2018 CLINICAL DATA:  Pt c/o chronic cough x 1 year, hemoptysis onset this a.m. that was dark red. H/o HTN and Asthma. EXAM: CHEST - 2 VIEW COMPARISON:  03/26/2018 FINDINGS: The heart size and mediastinal contours are within normal limits. Both  lungs are clear. No pleural effusion or pneumothorax. The visualized skeletal structures are unremarkable. IMPRESSION: No active cardiopulmonary disease. Electronically Signed   By: Amie Portland M.D.   On: 04/11/2018 12:27    Pending Labs [employer] (From admission, onward)    Start     Ordered   Signed and Held  HIV antibody (Routine Testing)  Once,   R     Signed and Held   Signed and Held  Comprehensive metabolic panel  Tomorrow morning,   R     Signed and Held   Signed and Held  CBC  Tomorrow morning,   R     Signed and Held          Vitals/Pain Today's Vitals   04/11/18 1300 04/11/18 1315 04/11/18 1330 04/11/18 1431  BP: 131/79  132/62 134/85  Pulse: 67 66 80 68  Resp:  19 (!) 30 20  Temp:      TempSrc:      SpO2: 95% 98% 100% 97%  Weight:      Height:      PainSc:        Isolation Precautions Droplet precaution  Medications Medications  oseltamivir (TAMIFLU) capsule 30 mg (30 mg Oral Given 04/11/18 1431)  lactated ringers infusion (has no administration in time range)  ipratropium-albuterol (DUONEB) 0.5-2.5 (3) MG/3ML nebulizer solution 3 mL (3 mLs Nebulization Given 04/11/18 1149)  ipratropium-albuterol (DUONEB) 0.5-2.5 (3) MG/3ML nebulizer solution 3 mL (3 mLs Nebulization Given 04/11/18 1148)    Mobility walks

## 2018-04-11 NOTE — ED Notes (Signed)
Patient transported to CT 

## 2018-04-11 NOTE — Telephone Encounter (Signed)
Received call from patient who reported increased dyspnea with hemoptysis of 3-4 teaspoons of blood. Mild wheezing, but feels different than usual asthma flare.  Denies URTI symptoms or fever. Patient was speaking in full sentences but voice was hoarse.  Advised her that LRTI most common cause of hemoptysis and recommended that she should proceed to ED for evaluation. She told me that she intended to go to the [facility] ED.  Lynnell Catalan, MD [facility] ICU Physician [facility] [place] [facility]  Pager: [phone] Mobile: [phone] After hours: [phone].  04/11/2018, 9:31 AM

## 2018-04-11 NOTE — ED Notes (Signed)
Patient transported to and from X-ray 

## 2018-04-11 NOTE — ED Notes (Signed)
Pt ambulated on room air to bathroom, appx 50 feet.  Upon return to room O2 sat's were 79% on RA.  Pt was placed on 3 L O2 Nasal cannula, O2 sats 96%. Will continue to monitor.

## 2018-04-11 NOTE — Progress Notes (Signed)
Patient complains of insomnia,may need trazodone PRN dose.  Paged on call provider and received one time dose only of trazodone, will administer. Patient also states, she takes Symbicort inhaler at home and has not had this hospital stay, none ordered. Will continue to montior.

## 2018-04-12 DIAGNOSIS — R05 Cough: Secondary | ICD-10-CM | POA: Diagnosis not present

## 2018-04-12 DIAGNOSIS — R042 Hemoptysis: Secondary | ICD-10-CM

## 2018-04-12 DIAGNOSIS — J111 Influenza due to unidentified influenza virus with other respiratory manifestations: Secondary | ICD-10-CM | POA: Diagnosis not present

## 2018-04-12 DIAGNOSIS — J45991 Cough variant asthma: Secondary | ICD-10-CM | POA: Diagnosis not present

## 2018-04-12 DIAGNOSIS — R0902 Hypoxemia: Secondary | ICD-10-CM

## 2018-04-12 LAB — COMPREHENSIVE METABOLIC PANEL
ALBUMIN: 3.5 g/dL (ref 3.5–5.0)
ALT: 75 U/L — ABNORMAL HIGH (ref 0–44)
AST: 59 U/L — ABNORMAL HIGH (ref 15–41)
Alkaline Phosphatase: 53 U/L (ref 38–126)
Anion gap: 10 (ref 5–15)
BILIRUBIN TOTAL: 0.5 mg/dL (ref 0.3–1.2)
BUN: 13 mg/dL (ref 8–23)
CO2: 30 mmol/L (ref 22–32)
Calcium: 8.7 mg/dL — ABNORMAL LOW (ref 8.9–10.3)
Chloride: 99 mmol/L (ref 98–111)
Creatinine, Ser: 0.85 mg/dL (ref 0.44–1.00)
GFR calc Af Amer: >60 mL/min (ref >60)
GFR calc non Af Amer: >60 mL/min (ref >60)
GLUCOSE: 93 mg/dL (ref 70–99)
Potassium: 3.6 mmol/L (ref 3.5–5.1)
Sodium: 139 mmol/L (ref 135–145)
Total Protein: 6.4 g/dL — ABNORMAL LOW (ref 6.5–8.1)

## 2018-04-12 LAB — CBC
HCT: 36.2 % (ref 36.0–46.0)
Hemoglobin: 11.4 g/dL — ABNORMAL LOW (ref 12.0–15.0)
MCH: 29.5 pg (ref 26.0–34.0)
MCHC: 31.5 g/dL (ref 30.0–36.0)
MCV: 93.5 fL (ref 80.0–100.0)
Platelets: 178 10*3/uL (ref 150–400)
RBC: 3.87 MIL/uL (ref 3.87–5.11)
RDW: 13.7 % (ref 11.5–15.5)
WBC: 10.4 10*3/uL (ref 4.0–10.5)
nRBC: 0 % (ref 0.0–0.2)

## 2018-04-12 LAB — HIV ANTIBODY (ROUTINE TESTING W REFLEX): HIV Screen 4th Generation wRfx: NONREACTIVE

## 2018-04-12 MED ORDER — MOMETASONE FURO-FORMOTEROL FUM 100-5 MCG/ACT IN AERO
2.0000 | INHALATION_SPRAY | Freq: Two times a day (BID) | RESPIRATORY_TRACT | Status: DC
  Administered 2018-04-12 – 2018-04-14 (×5): 2 via RESPIRATORY_TRACT
  Filled 2018-04-12: qty 8.8

## 2018-04-12 MED ORDER — BENZONATATE 100 MG PO CAPS
200.0000 mg | ORAL_CAPSULE | Freq: Three times a day (TID) | ORAL | Status: DC
  Administered 2018-04-12 – 2018-04-14 (×5): 200 mg via ORAL
  Filled 2018-04-12 (×6): qty 2

## 2018-04-12 MED ORDER — HYDROCODONE-ACETAMINOPHEN 5-325 MG PO TABS
1.0000 | ORAL_TABLET | Freq: Four times a day (QID) | ORAL | Status: DC | PRN
  Administered 2018-04-12: 1 via ORAL
  Filled 2018-04-12: qty 1

## 2018-04-12 MED ORDER — LORAZEPAM 0.5 MG PO TABS: 0.5000 mg | ORAL_TABLET | Freq: Three times a day (TID) | ORAL | Status: DC | PRN

## 2018-04-12 MED ORDER — SODIUM CHLORIDE 0.9 % IV SOLN
INTRAVENOUS | Status: DC
  Administered 2018-04-12 – 2018-04-13 (×2): via INTRAVENOUS

## 2018-04-12 MED ORDER — ZOLPIDEM TARTRATE 5 MG PO TABS: 5.0000 mg | ORAL_TABLET | Freq: Every evening | ORAL | Status: DC | PRN

## 2018-04-12 MED ORDER — IPRATROPIUM-ALBUTEROL 0.5-2.5 (3) MG/3ML IN SOLN
3.0000 mL | Freq: Three times a day (TID) | RESPIRATORY_TRACT | Status: DC
  Administered 2018-04-12 – 2018-04-14 (×8): 3 mL via RESPIRATORY_TRACT
  Filled 2018-04-12 (×8): qty 3

## 2018-04-12 MED ORDER — HYDROCOD POLST-CPM POLST ER 10-8 MG/5ML PO SUER
5.0000 mL | Freq: Two times a day (BID) | ORAL | Status: DC
  Administered 2018-04-12 – 2018-04-14 (×5): 5 mL via ORAL
  Filled 2018-04-12 (×5): qty 5

## 2018-04-12 MED ORDER — SODIUM CHLORIDE 0.9 % IV SOLN
INTRAVENOUS | Status: AC
  Administered 2018-04-12: 09:00:00 via INTRAVENOUS

## 2018-04-12 NOTE — Progress Notes (Signed)
[facility]                                                                              Patient Demographics  Sue Mitchell, is a 63 y.o. female, DOB - 04/28/1955, ION:[id number]  Admit date - 04/11/2018   Admitting Physician A Grier Mittsaldwell Powell Jr., MD  Outpatient Primary MD for the patient is Renaye RakersBland, Veita, MD  Outpatient specialists:   LOS - 0  days   Medical records reviewed and are as summarized below:    Chief Complaint  Patient presents with  . Shortness of Breath  . Cough       Brief summary   Patient is a 63 year old female with history of asthma, hypertension, GERD, chronic cough presented with malaise, coughing, hemoptysis.  Per patient symptoms started on "Sunday almost a week ago when she noticed runny nose, frequent coughing.  2 days later her symptoms got worse with generalized malaise, productive cough with whitish phlegm.  Patient saw her pulmonologist who and was placed on steroid taper, PPI for reflux.  Subsequently she also had some hemoptysis, twice otherwise denied any fevers or chills.  She also reported off-and-on diarrhea. Influenza panel was positive for influenza A   Assessment & Plan    Principal Problem: Acute Respiratory failure with hypoxia secondary to influenza A -Currently wheezing, coughing  -continue O2 as tolerated, Tamiflu -Continue scheduled bronchodilators, placed on Dulera -Continue prednisone, antitussives, placed on Tussionex, Tessalon Perles -Flutter valve   Active Problems:  Hemoptysis -Likely due to tracheobronchitis -CT angiogram of the chest negative for PE or acute findings -Continue current management   Hypertension BP currently stable, holding irbesartan, HCTZ  GERD, cough variant asthma -Continue PPI -Will discontinue irbesartan due to continued cough, which seems to be her main complaint  Code Status: Full CODE STATUS DVT Prophylaxis:  SCD's Family Communication: Discussed in detail with  the patient, all imaging results, lab results explained to the patient    Disposition Plan: Continue current management, needs scheduled nebs, inpatient treatment for at least 24 hours  Time Spent in minutes 35 minutes  Procedures:  CT chest  Consultants:   None  Antimicrobials:   Anti-infectives (From admission, onward)   Start     Dose/Rate Route Frequency Ordered Stop   04/11/18 1445  oseltamivir (TAMIFLU) capsule 30 mg     30"  mg Oral 2 times daily 04/11/18 1352 04/16/18 0959          Medications  Scheduled Meds: . benzonatate  200 mg Oral TID  . chlorpheniramine-HYDROcodone  5 mL Oral Q12H  . gabapentin  100 mg Oral BID  . ipratropium-albuterol  3 mL Nebulization TID  . mometasone-formoterol  2 puff Inhalation BID  . oseltamivir  30 mg Oral BID  . pantoprazole  40 mg Oral BID AC  . predniSONE  40 mg Oral Q breakfast   Continuous Infusions: . sodium chloride 75 mL/hr at 04/12/18 0905  . sodium chloride     PRN Meds:.acetaminophen **OR** acetaminophen, albuterol, HYDROcodone-acetaminophen, LORazepam, polyethylene glycol, zolpidem      Subjective:   Sue Mitchell was seen and examined today.  Multiple complaints including  insomnia, anxiety, coughing. Patient denies dizziness, abdominal pain, N/V/D/C, new weakness, numbess, tingling. No acute events overnight.    Objective:   Vitals:   04/11/18 2118 04/11/18 2118 04/12/18 0607 04/12/18 0749  BP:  (!) 153/86 133/83   Pulse:  74 64   Resp:  18 18   Temp:  98.4 F (36.9 C) 98.3 F (36.8 C)   TempSrc:  Oral Oral   SpO2: 94% 99% 100% 99%  Weight:      Height:        Intake/Output Summary (Last 24 hours) at 04/12/2018 0959 Last data filed at 04/12/2018 0905 Gross per 24 hour  Intake 1618.37 ml  Output -  Net 1618.37 ml     Wt Readings from Last 3 Encounters:  04/11/18 76.3 kg  04/08/18 74.6 kg  03/26/18 78.6 kg     Exam  General: Alert and oriented x 3, NAD, coughing  Eyes:   HEENT:   Atraumatic, normocephalic, normal oropharynx  Cardiovascular: S1 S2 auscultated, Regular rate and rhythm.  Respiratory: Bilateral expiratory wheezing  Gastrointestinal: Soft, nontender, nondistended, + bowel sounds  Ext: no pedal edema bilaterally  Neuro: No new deficits  Musculoskeletal: No digital cyanosis, clubbing  Skin: No rashes  Psych: Anxious   Data Reviewed:  I have personally reviewed following labs and imaging studies  Micro Results No results found for this or any previous visit (from the past 240 hour(s)).  Radiology Reports Dg Chest 2 View  Result Date: 04/11/2018 CLINICAL DATA:  Pt c/o chronic cough x 1 year, hemoptysis onset this a.m. that was dark red. H/o HTN and Asthma. EXAM: CHEST - 2 VIEW COMPARISON:  03/26/2018 FINDINGS: The heart size and mediastinal contours are within normal limits. Both lungs are clear. No pleural effusion or pneumothorax. The visualized skeletal structures are unremarkable. IMPRESSION: No active cardiopulmonary disease. Electronically Signed   By: Amie Portland M.D.   On: 04/11/2018 12:27   Dg Chest 2 View  Result Date: 03/26/2018 CLINICAL DATA:  Cough variant asthma.  History of hypertension. EXAM: CHEST - 2 VIEW COMPARISON:  12/15/2016 FINDINGS: No significant degree of airway thickening is appreciated radiographically. The lungs appear clear. Cardiac and mediastinal margins appear normal. No pleural effusion. Mild thoracic spondylosis. IMPRESSION: 1.  No active cardiopulmonary disease is radiographically apparent. 2. Mild thoracic spondylosis. Electronically Signed   By: Gaylyn Rong M.D.   On: 03/26/2018 17:32   Ct Angio Chest Pe W Or Wo Contrast  Result Date: 04/11/2018 CLINICAL DATA:  Hemoptysis. EXAM: CT ANGIOGRAPHY CHEST WITH CONTRAST TECHNIQUE: Multidetector CT imaging of the chest was performed using the standard protocol during bolus administration of intravenous contrast. Multiplanar CT image reconstructions and MIPs  were obtained to evaluate the vascular anatomy. CONTRAST:  ISOVUE-370 IOPAMIDOL (ISOVUE-370) INJECTION 76% COMPARISON:  Chest x-ray earlier today. FINDINGS: Cardiovascular: There is respiratory motion on the study and suboptimal opacification of segmental and subsegmental pulmonary artery branches. No obvious pulmonary embolism. Central pulmonary arteries are normal in caliber. The thoracic aorta is normal in caliber. The heart size is normal. No pericardial fluid identified. No significant calcified coronary artery plaque identified. Mediastinum/Nodes: No enlarged mediastinal, hilar, or axillary lymph nodes. The thyroid gland may be mildly enlarged. Lungs/Pleura: Detailed evaluation of the lungs is impaired by respiratory motion. There is no obvious evidence of pulmonary edema, consolidation, pneumothorax, nodule or pleural fluid. Upper Abdomen: No acute abnormality. Musculoskeletal: No chest wall abnormality. No acute or significant osseous findings. Review of the MIP images confirms  the above findings. IMPRESSION: Limited study due to respiratory motion. No obvious pulmonary embolism or other acute findings. Electronically Signed   By: Irish Lack M.D.   On: 04/11/2018 15:20   Ct Maxillofacial Ltd Wo Cm  Result Date: 04/07/2018 CLINICAL DATA:  63 year old female with paranasal sinus symptoms, cough. EXAM: CT PARANASAL SINUS LIMITED WITHOUT CONTRAST TECHNIQUE: Non-contiguous multidetector CT images of the paranasal sinuses were obtained in a single plane without contrast. COMPARISON:  None. FINDINGS: Negative visible noncontrast brain parenchyma. Negative visible noncontrast orbits and face soft tissues. Visible mastoid air cells are clear. Visible bilateral paranasal sinuses are well pneumatized with no bubbly opacity or sinus fluid level evident. Negative nasal cavity; leftward nasal septal deviation and incidental bilateral concha bullosa. No acute osseous abnormality identified. IMPRESSION:  Negative study.  No evidence of paranasal sinusitis. Electronically Signed   By: Odessa Fleming M.D.   On: 04/07/2018 14:34    Lab Data:  CBC: Recent Labs  Lab 04/11/18 1151 04/12/18 0540  WBC 10.8* 10.4  HGB 12.3 11.4*  HCT 38.7 36.2  MCV 94.4 93.5  PLT 166 178   Basic Metabolic Panel: Recent Labs  Lab 04/11/18 1151 04/12/18 0540  NA 138 139  K 3.1* 3.6  CL 100 99  CO2 28 30  GLUCOSE 107* 93  BUN 13 13  CREATININE 0.86 0.85  CALCIUM 8.9 8.7*   GFR: Estimated Creatinine Clearance: 59.7 mL/min (by C-G formula based on SCr of 0.85 mg/dL). Liver Function Tests: Recent Labs  Lab 04/12/18 0540  AST 59*  ALT 75*  ALKPHOS 53  BILITOT 0.5  PROT 6.4*  ALBUMIN 3.5   No results for input(s): LIPASE, AMYLASE in the last 168 hours. No results for input(s): AMMONIA in the last 168 hours. Coagulation Profile: No results for input(s): INR, PROTIME in the last 168 hours. Cardiac Enzymes: Recent Labs  Lab 04/11/18 1151  TROPONINI <0.03   BNP (last 3 results) No results for input(s): PROBNP in the last 8760 hours. HbA1C: No results for input(s): HGBA1C in the last 72 hours. CBG: No results for input(s): GLUCAP in the last 168 hours. Lipid Profile: No results for input(s): CHOL, HDL, LDLCALC, TRIG, CHOLHDL, LDLDIRECT in the last 72 hours. Thyroid Function Tests: No results for input(s): TSH, T4TOTAL, FREET4, T3FREE, THYROIDAB in the last 72 hours. Anemia Panel: No results for input(s): VITAMINB12, FOLATE, FERRITIN, TIBC, IRON, RETICCTPCT in the last 72 hours. Urine analysis:    Component Value Date/Time   LABSPEC 1.015 06/10/2013 1118   PHURINE 7.0 06/10/2013 1118   GLUCOSEU NEGATIVE 06/10/2013 1118   HGBUR TRACE (A) 06/10/2013 1118   BILIRUBINUR NEGATIVE 06/10/2013 1118   KETONESUR NEGATIVE 06/10/2013 1118   PROTEINUR NEGATIVE 06/10/2013 1118   UROBILINOGEN 0.2 06/10/2013 1118   NITRITE NEGATIVE 06/10/2013 1118   LEUKOCYTESUR NEGATIVE 06/10/2013 1118      Shakia Sebastiano M.D. [facility] 04/12/2018, 9:59 AM  Pager: [phone] Between 7am to 7pm - call Pager - [phone]  After 7pm go to [email or web address] - password [email or web address]  Call night coverage person covering after 7pm

## 2018-04-13 DIAGNOSIS — R000 Tachycardia, unspecified: Secondary | ICD-10-CM | POA: Diagnosis not present

## 2018-04-13 DIAGNOSIS — I10 Essential (primary) hypertension: Secondary | ICD-10-CM | POA: Diagnosis present

## 2018-04-13 DIAGNOSIS — Z79899 Other long term (current) drug therapy: Secondary | ICD-10-CM | POA: Diagnosis not present

## 2018-04-13 DIAGNOSIS — R197 Diarrhea, unspecified: Secondary | ICD-10-CM | POA: Diagnosis present

## 2018-04-13 DIAGNOSIS — E876 Hypokalemia: Secondary | ICD-10-CM | POA: Diagnosis present

## 2018-04-13 DIAGNOSIS — G4700 Insomnia, unspecified: Secondary | ICD-10-CM | POA: Diagnosis present

## 2018-04-13 DIAGNOSIS — R042 Hemoptysis: Secondary | ICD-10-CM | POA: Diagnosis not present

## 2018-04-13 DIAGNOSIS — R0602 Shortness of breath: Secondary | ICD-10-CM | POA: Diagnosis not present

## 2018-04-13 DIAGNOSIS — J9601 Acute respiratory failure with hypoxia: Secondary | ICD-10-CM | POA: Diagnosis not present

## 2018-04-13 DIAGNOSIS — F419 Anxiety disorder, unspecified: Secondary | ICD-10-CM | POA: Diagnosis present

## 2018-04-13 DIAGNOSIS — E669 Obesity, unspecified: Secondary | ICD-10-CM | POA: Diagnosis present

## 2018-04-13 DIAGNOSIS — J45901 Unspecified asthma with (acute) exacerbation: Secondary | ICD-10-CM | POA: Diagnosis present

## 2018-04-13 DIAGNOSIS — Z87891 Personal history of nicotine dependence: Secondary | ICD-10-CM | POA: Diagnosis not present

## 2018-04-13 DIAGNOSIS — Z885 Allergy status to narcotic agent status: Secondary | ICD-10-CM | POA: Diagnosis not present

## 2018-04-13 DIAGNOSIS — Z79890 Hormone replacement therapy (postmenopausal): Secondary | ICD-10-CM | POA: Diagnosis not present

## 2018-04-13 DIAGNOSIS — J449 Chronic obstructive pulmonary disease, unspecified: Secondary | ICD-10-CM | POA: Diagnosis present

## 2018-04-13 DIAGNOSIS — J111 Influenza due to unidentified influenza virus with other respiratory manifestations: Secondary | ICD-10-CM | POA: Diagnosis not present

## 2018-04-13 DIAGNOSIS — R0902 Hypoxemia: Secondary | ICD-10-CM | POA: Diagnosis not present

## 2018-04-13 DIAGNOSIS — K219 Gastro-esophageal reflux disease without esophagitis: Secondary | ICD-10-CM | POA: Diagnosis present

## 2018-04-13 DIAGNOSIS — R05 Cough: Secondary | ICD-10-CM | POA: Diagnosis not present

## 2018-04-13 DIAGNOSIS — J101 Influenza due to other identified influenza virus with other respiratory manifestations: Secondary | ICD-10-CM | POA: Diagnosis present

## 2018-04-13 DIAGNOSIS — Z7952 Long term (current) use of systemic steroids: Secondary | ICD-10-CM | POA: Diagnosis not present

## 2018-04-13 DIAGNOSIS — Z6837 Body mass index (BMI) 37.0-37.9, adult: Secondary | ICD-10-CM | POA: Diagnosis not present

## 2018-04-13 DIAGNOSIS — J45991 Cough variant asthma: Secondary | ICD-10-CM | POA: Diagnosis not present

## 2018-04-13 LAB — BASIC METABOLIC PANEL
Anion gap: 10 (ref 5–15)
BUN: 18 mg/dL (ref 8–23)
CALCIUM: 8.6 mg/dL — AB (ref 8.9–10.3)
CO2: 27 mmol/L (ref 22–32)
Chloride: 102 mmol/L (ref 98–111)
Creatinine, Ser: 0.86 mg/dL (ref 0.44–1.00)
GFR calc Af Amer: >60 mL/min (ref >60)
GFR calc non Af Amer: >60 mL/min (ref >60)
Glucose, Bld: 91 mg/dL (ref 70–99)
Potassium: 3.6 mmol/L (ref 3.5–5.1)
Sodium: 139 mmol/L (ref 135–145)

## 2018-04-13 LAB — CBC
HCT: 35.3 % — ABNORMAL LOW (ref 36.0–46.0)
Hemoglobin: 10.8 g/dL — ABNORMAL LOW (ref 12.0–15.0)
MCH: 29.2 pg (ref 26.0–34.0)
MCHC: 30.6 g/dL (ref 30.0–36.0)
MCV: 95.4 fL (ref 80.0–100.0)
Platelets: 176 10*3/uL (ref 150–400)
RBC: 3.7 MIL/uL — ABNORMAL LOW (ref 3.87–5.11)
RDW: 13.9 % (ref 11.5–15.5)
WBC: 11.9 10*3/uL — ABNORMAL HIGH (ref 4.0–10.5)
nRBC: 0 % (ref 0.0–0.2)

## 2018-04-13 MED ORDER — AMLODIPINE BESYLATE 5 MG PO TABS
5.0000 mg | ORAL_TABLET | Freq: Every day | ORAL | Status: DC
  Administered 2018-04-13 – 2018-04-14 (×2): 5 mg via ORAL
  Filled 2018-04-13 (×2): qty 1

## 2018-04-13 MED ORDER — GUAIFENESIN-DM 100-10 MG/5ML PO SYRP
5.0000 mL | ORAL_SOLUTION | ORAL | Status: DC | PRN
  Administered 2018-04-13: 5 mL via ORAL
  Filled 2018-04-13: qty 10

## 2018-04-13 MED ORDER — SENNOSIDES-DOCUSATE SODIUM 8.6-50 MG PO TABS
2.0000 | ORAL_TABLET | Freq: Every day | ORAL | Status: DC | PRN
  Administered 2018-04-13: 2 via ORAL
  Filled 2018-04-13: qty 2

## 2018-04-13 MED ORDER — HYDROCHLOROTHIAZIDE 12.5 MG PO CAPS
12.5000 mg | ORAL_CAPSULE | Freq: Every day | ORAL | Status: DC
  Administered 2018-04-13 – 2018-04-14 (×2): 12.5 mg via ORAL
  Filled 2018-04-13 (×2): qty 1

## 2018-04-13 NOTE — Progress Notes (Addendum)
[facility]                                                                              Patient Demographics  Sue Mitchell, is a 63 y.o. female, DOB - 11/14/1955, XBJ:[id number]  Admit date - 04/11/2018   Admitting Physician A Grier Mittsaldwell Powell Jr., MD  Outpatient Primary MD for the patient is Renaye RakersBland, Veita, MD  Outpatient specialists:   LOS - 0  days   Medical records reviewed and are as summarized below:    Chief Complaint  Patient presents with  . Shortness of Breath  . Cough       Brief summary   Patient is a 63 year old female with history of asthma, hypertension, GERD, chronic cough presented with malaise, coughing, hemoptysis.  Per patient symptoms started on "Sunday almost a week ago when she noticed runny nose, frequent coughing.  2 days later her symptoms got worse with generalized malaise, productive cough with whitish phlegm.  Patient saw her pulmonologist who and was placed on steroid taper, PPI for reflux.  Subsequently she also had some hemoptysis, twice otherwise denied any fevers or chills.  She also reported off-and-on diarrhea. Influenza panel was positive for influenza A   Assessment & Plan    Principal Problem: Acute Respiratory failure with hypoxia secondary to influenza A -Wheezing is improving, coughing better today -continue O2 as tolerated, Tamiflu day #3 -Continue scheduled nebs, Dulera, prednisone  -Continue cough suppressants, cough is improving  -Flutter valve, wean O2 today, home O2 evaluation   Active Problems:  Hemoptysis -Likely due to tracheobronchitis -CT angiogram of the chest negative for PE or acute findings -Continue current management, no further episodes of hemoptysis  Hypertension holding irbesartan, HCTZ BP creeping up, will start on Norvasc 5 mg daily, restart HCTZ 12.5 mg daily.  Discontinued irbesartan.  GERD, cough variant asthma -Continue PPI -Will discontinue irbesartan due to continued cough,  which seems to be her main complaint  Code Status: Full CODE STATUS DVT Prophylaxis:  SCD's Family Communication: Discussed in detail with the patient, all imaging results, lab results explained to the patient    Disposition Plan: Continue current management, needs scheduled nebs, inpatient treatment for at least 24 hours, home O2 evaluation today, possible DC home in a.m.  Time Spent in minutes 35 minutes  Procedures:  CT chest  Consultants:   None  Antimicrobials:   Anti-infectives (From admission, onward)   Start     Dose/Rate Route Frequency Ordered Stop   04/11/18 1445  oseltamivir (TAMIFLU) capsule 30 mg     30"  mg Oral 2 times daily 04/11/18 1352 04/16/18 0959         Medications  Scheduled Meds: . benzonatate  200 mg Oral TID  . chlorpheniramine-HYDROcodone  5 mL Oral Q12H  . gabapentin  100 mg Oral BID  . ipratropium-albuterol  3 mL Nebulization TID  . mometasone-formoterol  2 puff Inhalation BID  . oseltamivir  30 mg Oral BID  . pantoprazole  40 mg Oral BID AC  . predniSONE  40 mg Oral Q breakfast   Continuous Infusions: . sodium chloride 10 mL/hr at 04/13/18 0551  PRN Meds:.acetaminophen **OR** acetaminophen, albuterol, HYDROcodone-acetaminophen, LORazepam, polyethylene glycol, zolpidem      Subjective:   Sue Mitchell was seen and examined today.  Overall starting to feel better, cough is improving.  Still on O2.  Patient denies dizziness, abdominal pain, N/V/D/C, new weakness, numbess, tingling. No acute events overnight.    Objective:   Vitals:   04/12/18 2023 04/12/18 2137 04/13/18 0538 04/13/18 0831  BP: (!) 145/76  (!) 164/97   Pulse: 77  63   Resp: 19  19   Temp: 98.6 F (37 C)  98.3 F (36.8 C)   TempSrc: Oral  Oral   SpO2: 95% 97% 98% 93%  Weight:      Height:        Intake/Output Summary (Last 24 hours) at 04/13/2018 1101 Last data filed at 04/13/2018 0600 Gross per 24 hour  Intake 1256.21 ml  Output -  Net 1256.21 ml      Wt Readings from Last 3 Encounters:  04/11/18 76.3 kg  04/08/18 74.6 kg  03/26/18 78.6 kg   Physical Exam  General: Alert and oriented x 3, NAD  Eyes:   HEENT:  Atraumatic, normocephalic  Cardiovascular: S1 S2 clear,  RRR. No pedal edema b/l  Respiratory: Bilateral expiratory wheezing improving from yesterday  Gastrointestinal: Soft, nontender, nondistended, NBS  Ext: no pedal edema bilaterally  Neuro: no new deficits  Musculoskeletal: No cyanosis, clubbing  Skin: No rashes  Psych: Normal affect and demeanor, alert and oriented x3      Data Reviewed:  I have personally reviewed following labs and imaging studies  Micro Results No results found for this or any previous visit (from the past 240 hour(s)).  Radiology Reports Dg Chest 2 View  Result Date: 04/11/2018 CLINICAL DATA:  Pt c/o chronic cough x 1 year, hemoptysis onset this a.m. that was dark red. H/o HTN and Asthma. EXAM: CHEST - 2 VIEW COMPARISON:  03/26/2018 FINDINGS: The heart size and mediastinal contours are within normal limits. Both lungs are clear. No pleural effusion or pneumothorax. The visualized skeletal structures are unremarkable. IMPRESSION: No active cardiopulmonary disease. Electronically Signed   By: Amie Portland M.D.   On: 04/11/2018 12:27   Dg Chest 2 View  Result Date: 03/26/2018 CLINICAL DATA:  Cough variant asthma.  History of hypertension. EXAM: CHEST - 2 VIEW COMPARISON:  12/15/2016 FINDINGS: No significant degree of airway thickening is appreciated radiographically. The lungs appear clear. Cardiac and mediastinal margins appear normal. No pleural effusion. Mild thoracic spondylosis. IMPRESSION: 1.  No active cardiopulmonary disease is radiographically apparent. 2. Mild thoracic spondylosis. Electronically Signed   By: Gaylyn Rong M.D.   On: 03/26/2018 17:32   Ct Angio Chest Pe W Or Wo Contrast  Result Date: 04/11/2018 CLINICAL DATA:  Hemoptysis. EXAM: CT ANGIOGRAPHY CHEST  WITH CONTRAST TECHNIQUE: Multidetector CT imaging of the chest was performed using the standard protocol during bolus administration of intravenous contrast. Multiplanar CT image reconstructions and MIPs were obtained to evaluate the vascular anatomy. CONTRAST:  ISOVUE-370 IOPAMIDOL (ISOVUE-370) INJECTION 76% COMPARISON:  Chest x-ray earlier today. FINDINGS: Cardiovascular: There is respiratory motion on the study and suboptimal opacification of segmental and subsegmental pulmonary artery branches. No obvious pulmonary embolism. Central pulmonary arteries are normal in caliber. The thoracic aorta is normal in caliber. The heart size is normal. No pericardial fluid identified. No significant calcified coronary artery plaque identified. Mediastinum/Nodes: No enlarged mediastinal, hilar, or axillary lymph nodes. The thyroid gland may be mildly enlarged. Lungs/Pleura: Detailed  evaluation of the lungs is impaired by respiratory motion. There is no obvious evidence of pulmonary edema, consolidation, pneumothorax, nodule or pleural fluid. Upper Abdomen: No acute abnormality. Musculoskeletal: No chest wall abnormality. No acute or significant osseous findings. Review of the MIP images confirms the above findings. IMPRESSION: Limited study due to respiratory motion. No obvious pulmonary embolism or other acute findings. Electronically Signed   By: Irish Lack M.D.   On: 04/11/2018 15:20   Ct Maxillofacial Ltd Wo Cm  Result Date: 04/07/2018 CLINICAL DATA:  63 year old female with paranasal sinus symptoms, cough. EXAM: CT PARANASAL SINUS LIMITED WITHOUT CONTRAST TECHNIQUE: Non-contiguous multidetector CT images of the paranasal sinuses were obtained in a single plane without contrast. COMPARISON:  None. FINDINGS: Negative visible noncontrast brain parenchyma. Negative visible noncontrast orbits and face soft tissues. Visible mastoid air cells are clear. Visible bilateral paranasal sinuses are well pneumatized  with no bubbly opacity or sinus fluid level evident. Negative nasal cavity; leftward nasal septal deviation and incidental bilateral concha bullosa. No acute osseous abnormality identified. IMPRESSION: Negative study.  No evidence of paranasal sinusitis. Electronically Signed   By: Odessa Fleming M.D.   On: 04/07/2018 14:34    Lab Data:  CBC: Recent Labs  Lab 04/11/18 1151 04/12/18 0540 04/13/18 0529  WBC 10.8* 10.4 11.9*  HGB 12.3 11.4* 10.8*  HCT 38.7 36.2 35.3*  MCV 94.4 93.5 95.4  PLT 166 178 176   Basic Metabolic Panel: Recent Labs  Lab 04/11/18 1151 04/12/18 0540 04/13/18 0529  NA 138 139 139  K 3.1* 3.6 3.6  CL 100 99 102  CO2 28 30 27   GLUCOSE 107* 93 91  BUN 13 13 18   CREATININE 0.86 0.85 0.86  CALCIUM 8.9 8.7* 8.6*   GFR: Estimated Creatinine Clearance: 59 mL/min (by C-G formula based on SCr of 0.86 mg/dL). Liver Function Tests: Recent Labs  Lab 04/12/18 0540  AST 59*  ALT 75*  ALKPHOS 53  BILITOT 0.5  PROT 6.4*  ALBUMIN 3.5   No results for input(s): LIPASE, AMYLASE in the last 168 hours. No results for input(s): AMMONIA in the last 168 hours. Coagulation Profile: No results for input(s): INR, PROTIME in the last 168 hours. Cardiac Enzymes: Recent Labs  Lab 04/11/18 1151  TROPONINI <0.03   BNP (last 3 results) No results for input(s): PROBNP in the last 8760 hours. HbA1C: No results for input(s): HGBA1C in the last 72 hours. CBG: No results for input(s): GLUCAP in the last 168 hours. Lipid Profile: No results for input(s): CHOL, HDL, LDLCALC, TRIG, CHOLHDL, LDLDIRECT in the last 72 hours. Thyroid Function Tests: No results for input(s): TSH, T4TOTAL, FREET4, T3FREE, THYROIDAB in the last 72 hours. Anemia Panel: No results for input(s): VITAMINB12, FOLATE, FERRITIN, TIBC, IRON, RETICCTPCT in the last 72 hours. Urine analysis:    Component Value Date/Time   LABSPEC 1.015 06/10/2013 1118   PHURINE 7.0 06/10/2013 1118   GLUCOSEU NEGATIVE  06/10/2013 1118   HGBUR TRACE (A) 06/10/2013 1118   BILIRUBINUR NEGATIVE 06/10/2013 1118   KETONESUR NEGATIVE 06/10/2013 1118   PROTEINUR NEGATIVE 06/10/2013 1118   UROBILINOGEN 0.2 06/10/2013 1118   NITRITE NEGATIVE 06/10/2013 1118   LEUKOCYTESUR NEGATIVE 06/10/2013 1118     Sue Mitchell M.D. [facility] 04/13/2018, 11:01 AM  Pager: [phone] Between 7am to 7pm - call Pager - [phone]  After 7pm go to [email or web address] - password [email or web address]  Call night coverage person covering after 7pm

## 2018-04-14 ENCOUNTER — Other Ambulatory Visit: Payer: Self-pay

## 2018-04-14 ENCOUNTER — Inpatient Hospital Stay (HOSPITAL_COMMUNITY)
Admission: EM | Admit: 2018-04-14 | Discharge: 2018-04-18 | Disposition: A | Discharge status: Home / Self Care | Payer: BLUE CROSS/BLUE SHIELD | Source: Home / Self Care | Attending: Family Medicine | Admitting: Family Medicine

## 2018-04-14 ENCOUNTER — Encounter (HOSPITAL_COMMUNITY): Payer: Self-pay | Admitting: Emergency Medicine

## 2018-04-14 DIAGNOSIS — J111 Influenza due to unidentified influenza virus with other respiratory manifestations: Secondary | ICD-10-CM | POA: Diagnosis present

## 2018-04-14 DIAGNOSIS — J9601 Acute respiratory failure with hypoxia: Secondary | ICD-10-CM

## 2018-04-14 DIAGNOSIS — R05 Cough: Secondary | ICD-10-CM

## 2018-04-14 DIAGNOSIS — R0902 Hypoxemia: Secondary | ICD-10-CM

## 2018-04-14 DIAGNOSIS — R059 Cough, unspecified: Secondary | ICD-10-CM | POA: Diagnosis present

## 2018-04-14 DIAGNOSIS — J45991 Cough variant asthma: Secondary | ICD-10-CM | POA: Diagnosis present

## 2018-04-14 LAB — BASIC METABOLIC PANEL
Anion gap: 10 (ref 5–15)
Anion gap: 11 (ref 5–15)
BUN: 14 mg/dL (ref 8–23)
BUN: 14 mg/dL (ref 8–23)
CO2: 30 mmol/L (ref 22–32)
CO2: 30 mmol/L (ref 22–32)
Calcium: 8.6 mg/dL — ABNORMAL LOW (ref 8.9–10.3)
Calcium: 9.2 mg/dL (ref 8.9–10.3)
Chloride: 97 mmol/L — ABNORMAL LOW (ref 98–111)
Chloride: 97 mmol/L — ABNORMAL LOW (ref 98–111)
Creatinine, Ser: 0.79 mg/dL (ref 0.44–1.00)
Creatinine, Ser: 0.87 mg/dL (ref 0.44–1.00)
GFR calc Af Amer: >60 mL/min (ref >60)
GFR calc Af Amer: >60 mL/min (ref >60)
GFR calc non Af Amer: >60 mL/min (ref >60)
GLUCOSE: 89 mg/dL (ref 70–99)
Glucose, Bld: 181 mg/dL — ABNORMAL HIGH (ref 70–99)
Potassium: 3.3 mmol/L — ABNORMAL LOW (ref 3.5–5.1)
Potassium: 3.6 mmol/L (ref 3.5–5.1)
SODIUM: 138 mmol/L (ref 135–145)
Sodium: 137 mmol/L (ref 135–145)

## 2018-04-14 LAB — CBC WITH DIFFERENTIAL/PLATELET
Abs Immature Granulocytes: 0.1 10*3/uL — ABNORMAL HIGH (ref 0.00–0.07)
Basophils Absolute: 0 10*3/uL (ref 0.0–0.1)
Basophils Relative: 0 %
Eosinophils Absolute: 0 10*3/uL (ref 0.0–0.5)
Eosinophils Relative: 0 %
HCT: 39 % (ref 36.0–46.0)
Hemoglobin: 12.3 g/dL (ref 12.0–15.0)
IMMATURE GRANULOCYTES: 1 %
Lymphocytes Relative: 24 %
Lymphs Abs: 2.8 10*3/uL (ref 0.7–4.0)
MCH: 29.9 pg (ref 26.0–34.0)
MCHC: 31.5 g/dL (ref 30.0–36.0)
MCV: 94.7 fL (ref 80.0–100.0)
Monocytes Absolute: 1.1 10*3/uL — ABNORMAL HIGH (ref 0.1–1.0)
Monocytes Relative: 10 %
Neutro Abs: 7.4 10*3/uL (ref 1.7–7.7)
Neutrophils Relative %: 65 %
Platelets: 242 10*3/uL (ref 150–400)
RBC: 4.12 MIL/uL (ref 3.87–5.11)
RDW: 13.8 % (ref 11.5–15.5)
WBC: 11.5 10*3/uL — ABNORMAL HIGH (ref 4.0–10.5)
nRBC: 0 % (ref 0.0–0.2)

## 2018-04-14 LAB — CBC
HEMATOCRIT: 36.3 % (ref 36.0–46.0)
Hemoglobin: 11.4 g/dL — ABNORMAL LOW (ref 12.0–15.0)
MCH: 30 pg (ref 26.0–34.0)
MCHC: 31.4 g/dL (ref 30.0–36.0)
MCV: 95.5 fL (ref 80.0–100.0)
NRBC: 0 % (ref 0.0–0.2)
Platelets: 209 10*3/uL (ref 150–400)
RBC: 3.8 MIL/uL — ABNORMAL LOW (ref 3.87–5.11)
RDW: 14 % (ref 11.5–15.5)
WBC: 12 10*3/uL — ABNORMAL HIGH (ref 4.0–10.5)

## 2018-04-14 MED ORDER — METHYLPREDNISOLONE SODIUM SUCC 125 MG IJ SOLR
125.0000 mg | Freq: Once | INTRAMUSCULAR | Status: AC
  Administered 2018-04-15: 125 mg via INTRAVENOUS
  Filled 2018-04-14: qty 2

## 2018-04-14 MED ORDER — HYDROCOD POLST-CPM POLST ER 10-8 MG/5ML PO SUER: 5.0000 mL | Freq: Two times a day (BID) | ORAL | 0 refills | Status: DC | PRN

## 2018-04-14 MED ORDER — BUDESONIDE-FORMOTEROL FUMARATE 80-4.5 MCG/ACT IN AERO: 2.0000 | INHALATION_SPRAY | Freq: Two times a day (BID) | RESPIRATORY_TRACT | 3 refills | Status: DC

## 2018-04-14 MED ORDER — HYDROCHLOROTHIAZIDE 12.5 MG PO CAPS: 12.5000 mg | ORAL_CAPSULE | Freq: Every day | ORAL | 3 refills | Status: AC

## 2018-04-14 MED ORDER — OSELTAMIVIR PHOSPHATE 75 MG PO CAPS: 75.0000 mg | ORAL_CAPSULE | Freq: Two times a day (BID) | ORAL | 0 refills | Status: DC

## 2018-04-14 MED ORDER — IPRATROPIUM-ALBUTEROL 0.5-2.5 (3) MG/3ML IN SOLN
3.0000 mL | Freq: Once | RESPIRATORY_TRACT | Status: AC
  Administered 2018-04-14: 3 mL via RESPIRATORY_TRACT
  Filled 2018-04-14: qty 3

## 2018-04-14 MED ORDER — OSELTAMIVIR PHOSPHATE 75 MG PO CAPS
75.0000 mg | ORAL_CAPSULE | Freq: Two times a day (BID) | ORAL | Status: DC
  Administered 2018-04-14: 75 mg via ORAL
  Filled 2018-04-14: qty 1

## 2018-04-14 MED ORDER — PREDNISONE 10 MG PO TABS: ORAL_TABLET | ORAL | 0 refills | Status: DC

## 2018-04-14 MED ORDER — AMLODIPINE BESYLATE 5 MG PO TABS: 5.0000 mg | ORAL_TABLET | Freq: Every day | ORAL | 3 refills | Status: AC

## 2018-04-14 MED ORDER — BENZONATATE 200 MG PO CAPS: 200.0000 mg | ORAL_CAPSULE | Freq: Three times a day (TID) | ORAL | 0 refills | Status: DC | PRN

## 2018-04-14 NOTE — Progress Notes (Signed)
PHARMACY NOTE:  ANTIMICROBIAL RENAL DOSAGE ADJUSTMENT  Current antimicrobial regimen includes a mismatch between antimicrobial dosage and estimated renal function.  As per policy approved by the Pharmacy & Therapeutics and Medical Executive Committees, the antimicrobial dosage will be adjusted accordingly.  Current antimicrobial dosage:  oseltamivir 30 mg PO BID x 5 days  Indication: positive influenza  Renal Function:  Estimated Creatinine Clearance: 63.4 mL/min (by C-G formula based on SCr of 0.79 mg/dL). []      On intermittent HD, scheduled: []      On CRRT    Antimicrobial dosage has been changed to:  Oseltamivir 75 mg PO BID  Additional comments:   Thank you for allowing pharmacy to be a part of this patient's care.   Adalberto Cole, PharmD, BCPS Pager [phone] 04/14/2018 7:44 AM

## 2018-04-14 NOTE — ED Triage Notes (Signed)
Patient d/c today after admission for influenza A. Presents with SOB and cough. Unable to speak in full sentences. 80% on RA. 94% on 3L [place]. Hx asthma and COPD.

## 2018-04-14 NOTE — Assessment & Plan Note (Signed)
Continue tamiflu 

## 2018-04-14 NOTE — ED Notes (Signed)
Pt given soup and crackers per MD

## 2018-04-14 NOTE — ED Provider Notes (Signed)
[facility]-EMERGENCY DEPT Provider Note   CSN: [id number] Arrival date & time: 04/14/18  2019    History   Chief Complaint Chief Complaint  Patient presents with  . Shortness of Breath  . Influenza    HPI Sue Mitchell is a 63 y.o. female.  64 year old female here with increased shortness of breath.  She was just discharged today after being admitted for shortness of breath and found to have influenza.  On discharge she said she was feeling well and they did check a pulse ox off of oxygen and found that she was doing well enough to not need to be discharged with oxygen.  She said she was going to pick up her meds with increasing shortness of breath and ultimately was satting 80% on arrival.  She still coughing.  Denies fever.  She feels better after being put on oxygen.     The history is provided by the patient.  Shortness of Breath  Severity:  Moderate Onset quality:  Gradual Timing:  Constant Progression:  Partially resolved Chronicity:  Recurrent Relieved by:  Oxygen Worsened by:  Activity Ineffective treatments:  None tried Associated symptoms: abdominal pain and cough   Associated symptoms: no chest pain, no fever, no neck pain, no rash, no sore throat, no syncope and no vomiting   Influenza  Presenting symptoms: cough and shortness of breath   Presenting symptoms: no fever, no sore throat and no vomiting     Past Medical History:  Diagnosis Date  . Abdominal distension   . Abdominal pain   . Arthritis   . Asthma   . Biliary colic   . COPD (chronic obstructive pulmonary disease) (HCC)   . Diarrhea   . GERD (gastroesophageal reflux disease)   . Hypertension   . Nausea     Patient Active Problem List   Diagnosis Date Noted  . Hemoptysis 04/11/2018  . Influenza 04/11/2018  . Cough 04/11/2018  . Acute maxillary sinusitis 02/20/2018  . Rhinitis, chronic 12/04/2017  . Cough variant asthma  vs uacs wit vcd 12/26/2016    Past  Surgical History:  Procedure Laterality Date  . cartilage removal  2007   left knee  . CESAREAN SECTION  1976  . LAPAROSCOPY  1992 (approx)    abdominal     OB History   No obstetric history on file.      Home Medications    Prior to Admission medications   Medication Sig Start Date End Date Taking? Authorizing Provider  acyclovir (ZOVIRAX) 200 MG capsule Take 200 mg by mouth 2 (two) times daily as needed (outbreaks).  01/22/11   [provider]  albuterol (PROVENTIL HFA;VENTOLIN HFA) 108 (90 BASE) MCG/ACT inhaler Inhale 2 puffs into the lungs every 6 (six) hours as needed for wheezing or shortness of breath. 02/21/13   Rodolph Bong, MD  amLODipine (NORVASC) 5 MG tablet Take 1 tablet (5 mg total) by mouth daily. 04/15/18   Rai, Ripudeep Kirtland Bouchard, MD  benzonatate (TESSALON) 200 MG capsule Take 1 capsule (200 mg total) by mouth 3 (three) times daily as needed for cough. 04/14/18   Rai, Delene Ruffini, MD  budesonide-formoterol (SYMBICORT) 80-4.5 MCG/ACT inhaler Inhale 2 puffs into the lungs 2 (two) times daily for 1 day. 04/14/18 04/15/18  Rai, Delene Ruffini, MD  chlorpheniramine-HYDROcodone (TUSSIONEX) 10-8 MG/5ML SUER Take 5 mLs by mouth every 12 (twelve) hours as needed for cough. 04/14/18   Rai, Delene Ruffini, MD  gabapentin (NEURONTIN) 100 MG capsule  Take 100 mg by mouth 2 (two) times daily.    [provider]  hydrochlorothiazide (MICROZIDE) 12.5 MG capsule Take 1 capsule (12.5 mg total) by mouth daily. 04/15/18   Rai, Delene Ruffiniipudeep K, MD  HYDROcodone-acetaminophen (NORCO/VICODIN) 5-325 MG tablet Take 1 tablet by mouth every 6 (six) hours as needed for moderate pain. 04/08/18   Nyoka CowdenWert, Michael B, MD  oseltamivir (TAMIFLU) 75 MG capsule Take 1 capsule (75 mg total) by mouth 2 (two) times daily for 2 days. 04/14/18 04/16/18  Rai, Ripudeep Kirtland BouchardK, MD  pantoprazole (PROTONIX) 40 MG tablet Take 30- 60 min before your first and last meals of the day Patient taking differently: Take 40 mg by mouth 2 (two)  times daily before a meal.  04/08/18   Nyoka CowdenWert, Michael B, MD  predniSONE (DELTASONE) 10 MG tablet Prednisone dosing: Take  Prednisone 40mg  (4 tabs) x 3 days, then taper to 30mg  (3 tabs) x 3 days, then 20mg  (2 tabs) x 3days, then 10mg  (1 tab) x 3days, then OFF.  Dispense:  30 tabs, refills: None 04/14/18   Rai, Ripudeep K, MD  Tiotropium Bromide Monohydrate (SPIRIVA RESPIMAT) 2.5 MCG/ACT AERS Inhale 2 puffs into the lungs daily. 04/08/18   Nyoka CowdenWert, Michael B, MD    Family History Family History  Problem Relation Age of Onset  . Diabetes Father     Social History Social History   Tobacco Use  . Smoking status: Former Smoker    Packs/day: 0.50    Years: 25.00    Pack years: 12.50    Types: Cigarettes    Last attempt to quit: 2011    Years since quitting: 9.1  . Smokeless tobacco: Never Used  Substance Use Topics  . Alcohol use: Yes    Comment: occasional 2 per month  . Drug use: No     Allergies   Codeine and Ciprofloxacin   Review of Systems Review of Systems  Constitutional: Negative for fever.  HENT: Negative for sore throat.   Eyes: Negative for visual disturbance.  Respiratory: Positive for cough and shortness of breath.   Cardiovascular: Negative for chest pain and syncope.  Gastrointestinal: Positive for abdominal pain. Negative for vomiting.  Genitourinary: Negative for dysuria.  Musculoskeletal: Negative for neck pain.  Skin: Negative for rash.  Neurological: Negative for speech difficulty.     Physical Exam Updated Vital Signs BP (!) 160/95 (BP Location: Left Arm)   Pulse (!) 102   Temp 98.5 F (36.9 C) (Oral)   Resp (!) 26   SpO2 96%   Physical Exam Vitals signs and nursing note reviewed.  Constitutional:      General: She is not in acute distress.    Appearance: She is well-developed.  HENT:     Head: Normocephalic and atraumatic.  Eyes:     Conjunctiva/sclera: Conjunctivae normal.  Neck:     Musculoskeletal: Neck supple.  Cardiovascular:      Rate and Rhythm: Regular rhythm. Tachycardia present.     Heart sounds: No murmur.  Pulmonary:     Effort: Pulmonary effort is normal. No respiratory distress.     Breath sounds: Rhonchi (scattered) present.  Abdominal:     Palpations: Abdomen is soft.     Tenderness: There is no abdominal tenderness.  Musculoskeletal: Normal range of motion.     Right lower leg: She exhibits no tenderness. No edema.     Left lower leg: She exhibits no tenderness. No edema.  Skin:    General: Skin is warm and  dry.     Capillary Refill: Capillary refill takes less than 2 seconds.  Neurological:     General: No focal deficit present.     Mental Status: She is alert and oriented to person, place, and time.     Motor: No weakness.      ED Treatments / Results  Labs (all labs ordered are listed, but only abnormal results are displayed) Labs Reviewed  BASIC METABOLIC PANEL - Abnormal; Notable for the following components:      Result Value   Chloride 97 (*)    Glucose, Bld 181 (*)    All other components within normal limits  CBC WITH DIFFERENTIAL/PLATELET - Abnormal; Notable for the following components:   WBC 11.5 (*)    Monocytes Absolute 1.1 (*)    Abs Immature Granulocytes 0.10 (*)    All other components within normal limits    EKG EKG Interpretation  Date/Time:  Tuesday April 14 2018 20:36:50 EST Ventricular Rate:  99 PR Interval:    QRS Duration: 83 QT Interval:  345 QTC Calculation: 443 R Axis:   74 Text Interpretation:  Sinus rhythm Borderline T wave abnormalities similar to prior 10/10 Confirmed by Meridee Score [id number]) on 04/14/2018 8:58:00 PM   Radiology No results found.  Procedures Procedures (including critical care time)  Medications Ordered in ED Medications  ipratropium-albuterol (DUONEB) 0.5-2.5 (3) MG/3ML nebulizer solution 3 mL (has no administration in time range)     Initial Impression / Assessment and Plan / ED Course  I have reviewed the triage  vital signs and the nursing notes.  Pertinent labs & imaging results that were available during my care of the patient were reviewed by me and considered in my medical decision making (see chart for details).  Clinical Course as of Apr 14 2324  Tue Apr 14, 2018  2235 Tried patient off of oxygen and trended and she dropped down to 85%.  Improved after being placed back on oxygen in bed.  Patient hospitalist for admission.   [MB]  2238 Discussed with Dr. Imogene Burn from the hospitalist service who will evaluate the patient for admission.   [MB]    Clinical Course User Index [MB] Terrilee Files, MD        Final Clinical Impressions(s) / ED Diagnoses   Final diagnoses:  Hypoxia  Influenza    ED Discharge Orders    None       Terrilee Files, MD 04/14/18 2327

## 2018-04-14 NOTE — Care Management Note (Signed)
Case Management Note  Patient Details  Name: Sue Mitchell MRN: [id number] Date of Birth: 1955/07/08  Subjective/Objective:                  discharged  Action/Plan: Home no cm needs present  Expected Discharge Date:  04/14/18               Expected Discharge Plan:  Home/Self Care  In-House Referral:     Discharge planning Services  CM Consult  Post Acute Care Choice:    Choice offered to:     DME Arranged:    DME Agency:     HH Arranged:    HH Agency:     Status of Service:  Completed, signed off  If discussed at [employer] of Stay Meetings, dates discussed:    Additional Comments:  Golda Acre, RN 04/14/2018, 11:41 AM

## 2018-04-14 NOTE — Assessment & Plan Note (Signed)
Prn tussionex

## 2018-04-14 NOTE — H&P (Addendum)
History and Physical    Izabell Kosty YBO:[id number] DOB: 1955/04/03 DOA: 04/14/2018  PCP: Renaye Rakers, MD  Patient coming from: home  I have personally briefly reviewed patient's old medical records in [facility]  Chief Complaint: SOB  HPI: Cloye Lions is a 63 y.o. female with medical history significant of COPD, cough, recently discharged the hospital today for influenza and acute hypoxic respiratory failure.  Patient was successfully weaned to room air by the time she was discharged.  She states that she went home.  She went to pick up her prescriptions.  She felt really short of breath when she got home.  She came back to the ER.  She was noted to have a room air saturations of 86%.  Patient be readmitted to the hospital in order to get home O2 set up.  Patient denies any fever or chills.  She does still have some weakness.  Still wheezing.  Still coughing.   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  ED Course: Patient noted be hypoxic with room air saturations 86% and were documented.  Patient given breathing treatments.  She will be admitted for observation.  Review of Systems:  Review of Systems  Constitutional: Positive for malaise/fatigue.  HENT: Negative.   Eyes: Negative.   Respiratory: Positive for cough and shortness of breath.   Cardiovascular: Negative.   Gastrointestinal: Negative.   Genitourinary: Negative.   Musculoskeletal: Negative.   Skin: Negative.   Neurological: Negative.   Endo/Heme/Allergies: Negative.   Psychiatric/Behavioral: Negative.   All other systems reviewed and are negative.    Past Medical History:  Diagnosis Date  . Abdominal distension   . Abdominal pain   . Arthritis   . Asthma   . Biliary colic   . COPD (chronic obstructive pulmonary disease) (HCC)   . Diarrhea   . GERD (gastroesophageal reflux disease)   . Hypertension   . Nausea     Past Surgical History:  Procedure  Laterality Date  . cartilage removal  2007   left knee  . CESAREAN SECTION  1976  . LAPAROSCOPY  1992 (approx)    abdominal     reports that she quit smoking about 9 years ago. Her smoking use included cigarettes. She has a 12.50 pack-year smoking history. She has never used smokeless tobacco. She reports current alcohol use. She reports that she does not use drugs.  Allergies  Allergen Reactions  . Codeine Hives, Itching and Other (See Comments)    All over the body, including burning sensation.  . Ciprofloxacin Palpitations    Family History  Problem Relation Age of Onset  . Diabetes Father    Prior to Admission medications   Medication Sig Start Date End Date Taking? Authorizing Provider  acyclovir (ZOVIRAX) 200 MG capsule Take 200 mg by mouth 2 (two) times daily as needed (outbreaks).  01/22/11  Yes [provider]  albuterol (PROVENTIL HFA;VENTOLIN HFA) 108 (90 BASE) MCG/ACT inhaler Inhale 2 puffs into the lungs every 6 (six) hours as needed for wheezing or shortness of breath. 02/21/13  Yes Rodolph Bong, MD  amLODipine (NORVASC) 5 MG tablet Take 1 tablet (5 mg total) by mouth daily. 04/15/18  Yes Rai, Ripudeep K, MD  benzonatate (TESSALON) 200 MG capsule Take 1 capsule (200 mg total) by mouth 3 (three) times daily as needed for cough. 04/14/18  Yes Rai, Ripudeep K, MD  chlorpheniramine-HYDROcodone (TUSSIONEX) 10-8 MG/5ML SUER Take 5 mLs by mouth every  12 (twelve) hours as needed for cough. 04/14/18  Yes Rai, Ripudeep K, MD  gabapentin (NEURONTIN) 100 MG capsule Take 100 mg by mouth 2 (two) times daily.   Yes [provider]  hydrochlorothiazide (MICROZIDE) 12.5 MG capsule Take 1 capsule (12.5 mg total) by mouth daily. 04/15/18  Yes Rai, Ripudeep K, MD  HYDROcodone-acetaminophen (NORCO/VICODIN) 5-325 MG tablet Take 1 tablet by mouth every 6 (six) hours as needed for moderate pain. 04/08/18  Yes Nyoka CowdenWert, Michael B, MD  mometasone-formoterol (DULERA) 100-5 MCG/ACT AERO  Inhale 2 puffs into the lungs 2 (two) times daily.   Yes [provider]  oseltamivir (TAMIFLU) 75 MG capsule Take 1 capsule (75 mg total) by mouth 2 (two) times daily for 2 days. 04/14/18 04/16/18 Yes Rai, Ripudeep K, MD  pantoprazole (PROTONIX) 40 MG tablet Take 30- 60 min before your first and last meals of the day Patient taking differently: Take 40 mg by mouth 2 (two) times daily before a meal.  04/08/18  Yes Nyoka CowdenWert, Michael B, MD  predniSONE (DELTASONE) 10 MG tablet Prednisone dosing: Take  Prednisone 40mg  (4 tabs) x 3 days, then taper to 30mg  (3 tabs) x 3 days, then 20mg  (2 tabs) x 3days, then 10mg  (1 tab) x 3days, then OFF.  Dispense:  30 tabs, refills: None 04/14/18  Yes Rai, Ripudeep K, MD  Tiotropium Bromide Monohydrate (SPIRIVA RESPIMAT) 2.5 MCG/ACT AERS Inhale 2 puffs into the lungs daily. 04/08/18  Yes Nyoka CowdenWert, Michael B, MD  budesonide-formoterol (SYMBICORT) 80-4.5 MCG/ACT inhaler Inhale 2 puffs into the lungs 2 (two) times daily for 1 day. Patient not taking: Reported on 04/14/2018 04/14/18 04/15/18  Cathren Harshai, Ripudeep K, MD    Physical Exam: Vitals:   04/14/18 2100 04/14/18 2130 04/14/18 2312 04/14/18 2313  BP: (!) 142/81 (!) 148/91 (!) 159/82 (!) 159/82  Pulse: 86 92 82 80  Resp: (!) 21 20 19  (!) 21  Temp:      TempSrc:      SpO2: 97% 97% 96% 97%    Constitutional: NAD, calm, comfortable Vitals:   04/14/18 2100 04/14/18 2130 04/14/18 2312 04/14/18 2313  BP: (!) 142/81 (!) 148/91 (!) 159/82 (!) 159/82  Pulse: 86 92 82 80  Resp: (!) 21 20 19  (!) 21  Temp:      TempSrc:      SpO2: 97% 97% 96% 97%   Physical Exam  Nursing note and vitals reviewed. Constitutional: She is oriented to person, place, and time. She appears well-developed and well-nourished. No distress.  HENT:  Head: Normocephalic and atraumatic.  Nose: Nose normal.  Eyes: Pupils are equal, round, and reactive to light. Right eye exhibits no discharge. Left eye exhibits no discharge.  Neck: No JVD present.    Cardiovascular: Normal rate and regular rhythm.  Respiratory: Effort normal. No respiratory distress. She has wheezes.  Diminished BS bilaterally Faint wheeze bilaterally No accessory muscle use  GI: Soft. Bowel sounds are normal.  Musculoskeletal:        General: No deformity or edema.  Neurological: She is alert and oriented to person, place, and time.  Skin: Skin is warm and dry. She is not diaphoretic.  Psychiatric: She has a normal mood and affect. Her behavior is normal. Judgment and thought content normal.     Labs on Admission: I have personally reviewed following labs and imaging studies  CBC: Recent Labs  Lab 04/11/18 1151 04/12/18 0540 04/13/18 0529 04/14/18 0626 04/14/18 2119  WBC 10.8* 10.4 11.9* 12.0* 11.5*  NEUTROABS  --   --   --   --  7.4  HGB 12.3 11.4* 10.8* 11.4* 12.3  HCT 38.7 36.2 35.3* 36.3 39.0  MCV 94.4 93.5 95.4 95.5 94.7  PLT 166 178 176 209 242   Basic Metabolic Panel: Recent Labs  Lab 04/11/18 1151 04/12/18 0540 04/13/18 0529 04/14/18 0626 04/14/18 2119  NA 138 139 139 137 138  K 3.1* 3.6 3.6 3.3* 3.6  CL 100 99 102 97* 97*  CO2 28 30 27 30 30   GLUCOSE 107* 93 91 89 181*  BUN 13 13 18 14 14   CREATININE 0.86 0.85 0.86 0.79 0.87  CALCIUM 8.9 8.7* 8.6* 8.6* 9.2   GFR: Estimated Creatinine Clearance: 58.3 mL/min (by C-G formula based on SCr of 0.87 mg/dL). Liver Function Tests: Recent Labs  Lab 04/12/18 0540  AST 59*  ALT 75*  ALKPHOS 53  BILITOT 0.5  PROT 6.4*  ALBUMIN 3.5   No results for input(s): LIPASE, AMYLASE in the last 168 hours. No results for input(s): AMMONIA in the last 168 hours. Coagulation Profile: No results for input(s): INR, PROTIME in the last 168 hours. Cardiac Enzymes: Recent Labs  Lab 04/11/18 1151  TROPONINI <0.03   BNP (last 3 results) No results for input(s): PROBNP in the last 8760 hours. HbA1C: No results for input(s): HGBA1C in the last 72 hours. CBG: No results for input(s): GLUCAP in  the last 168 hours. Lipid Profile: No results for input(s): CHOL, HDL, LDLCALC, TRIG, CHOLHDL, LDLDIRECT in the last 72 hours. Thyroid Function Tests: No results for input(s): TSH, T4TOTAL, FREET4, T3FREE, THYROIDAB in the last 72 hours. Anemia Panel: No results for input(s): VITAMINB12, FOLATE, FERRITIN, TIBC, IRON, RETICCTPCT in the last 72 hours. Urine analysis:    Component Value Date/Time   LABSPEC 1.015 06/10/2013 1118   PHURINE 7.0 06/10/2013 1118   GLUCOSEU NEGATIVE 06/10/2013 1118   HGBUR TRACE (A) 06/10/2013 1118   BILIRUBINUR NEGATIVE 06/10/2013 1118   KETONESUR NEGATIVE 06/10/2013 1118   PROTEINUR NEGATIVE 06/10/2013 1118   UROBILINOGEN 0.2 06/10/2013 1118   NITRITE NEGATIVE 06/10/2013 1118   LEUKOCYTESUR NEGATIVE 06/10/2013 1118    Radiological Exams on Admission: No results found.  EKG: Independently reviewed. NSR  Assessment/Plan Principal Problem:   Acute respiratory failure with hypoxia (HCC) Active Problems:   Influenza   Cough   Cough variant asthma  vs uacs wit vcd  Acute respiratory failure with hypoxia (HCC) Observation bed. Continue with supplemental O2. Will have case manager arrange for short term home O2 and home nebulizer machine.  Influenza Continue tamiflu  Cough Prn tussionex  Cough variant asthma  vs uacs wit vcd Continue symbicort. Pt is followed by outpatient pulmonary  DVT prophylaxis: lovenox Code Status: Full Family Communication: no family at bedside. Pt makes her brother Dalene Worm her health care agent. Disposition Plan: plan for DC tomorrow after home O2 and home nebulizer machine are obtained.  Consults called: none Admission status: observation  Carollee Herter DO [facility]  04/14/2018, 11:52 PM

## 2018-04-14 NOTE — ED Notes (Signed)
Bed: WA07 Expected date:  Expected time:  Means of arrival:  Comments: T 3 

## 2018-04-14 NOTE — ED Notes (Signed)
Pt requesting something to eat stating she has not eaten since 7am. MD aware

## 2018-04-14 NOTE — Discharge Summary (Signed)
Physician Discharge Summary   Patient ID: Sue Mitchell MRN: [id number] DOB/AGE: Jul 22, 1955 63 y.o.  Admit date: 04/11/2018 Discharge date: 04/14/2018  Primary Care Physician:  Renaye Rakers, MD   Recommendations for Outpatient Follow-up:  1. Follow up with PCP in 1-2 weeks 2. Home O2 evaluation prior to discharge  Home Health: None  Equipment/Devices:   Discharge Condition: stable  CODE STATUS: FULL Diet recommendation: Heart healthy diet   Discharge Diagnoses:    Acute respiratory failure with hypoxia . Influenza A . Cough variant asthma . Hemoptysis likely due to tracheobronchitis Hypertension GERD  Consults: None    Allergies:   Allergies  Allergen Reactions  . Codeine Hives, Itching and Other (See Comments)    All over the body, including burning sensation.  . Ciprofloxacin Palpitations     DISCHARGE MEDICATIONS: Allergies as of 04/14/2018      Reactions   Codeine Hives, Itching, Other (See Comments)   All over the body, including burning sensation.   Ciprofloxacin Palpitations      Medication List    STOP taking these medications   chlorpheniramine 4 MG tablet Commonly known as:  CHLOR-TRIMETON   irbesartan-hydrochlorothiazide 300-12.5 MG tablet Commonly known as:  AVALIDE     TAKE these medications   acyclovir 200 MG capsule Commonly known as:  ZOVIRAX Take 200 mg by mouth 2 (two) times daily as needed (outbreaks).   albuterol 108 (90 Base) MCG/ACT inhaler Commonly known as:  PROVENTIL HFA;VENTOLIN HFA Inhale 2 puffs into the lungs every 6 (six) hours as needed for wheezing or shortness of breath.   amLODipine 5 MG tablet Commonly known as:  NORVASC Take 1 tablet (5 mg total) by mouth daily. Start taking on:  April 15, 2018   benzonatate 200 MG capsule Commonly known as:  TESSALON Take 1 capsule (200 mg total) by mouth 3 (three) times daily as needed for cough.   budesonide-formoterol 80-4.5 MCG/ACT inhaler Commonly known as:   SYMBICORT Inhale 2 puffs into the lungs 2 (two) times daily for 1 day.   chlorpheniramine-HYDROcodone 10-8 MG/5ML Suer Commonly known as:  TUSSIONEX Take 5 mLs by mouth every 12 (twelve) hours as needed for cough.   gabapentin 100 MG capsule Commonly known as:  NEURONTIN Take 100 mg by mouth 2 (two) times daily.   hydrochlorothiazide 12.5 MG capsule Commonly known as:  MICROZIDE Take 1 capsule (12.5 mg total) by mouth daily. Start taking on:  April 15, 2018   HYDROcodone-acetaminophen 5-325 MG tablet Commonly known as:  NORCO/VICODIN Take 1 tablet by mouth every 6 (six) hours as needed for moderate pain.   oseltamivir 75 MG capsule Commonly known as:  TAMIFLU Take 1 capsule (75 mg total) by mouth 2 (two) times daily for 2 days.   pantoprazole 40 MG tablet Commonly known as:  PROTONIX Take 30- 60 min before your first and last meals of the day What changed:    how much to take  how to take this  when to take this  additional instructions   predniSONE 10 MG tablet Commonly known as:  DELTASONE Prednisone dosing: Take  Prednisone 40mg  (4 tabs) x 3 days, then taper to 30mg  (3 tabs) x 3 days, then 20mg  (2 tabs) x 3days, then 10mg  (1 tab) x 3days, then OFF.  Dispense:  30 tabs, refills: None What changed:  additional instructions   Tiotropium Bromide Monohydrate 2.5 MCG/ACT Aers Commonly known as:  SPIRIVA RESPIMAT Inhale 2 puffs into the lungs daily.  Brief H and P: For complete details please refer to admission H and P, but in brief Patient is a 63 year old female with history of asthma, hypertension, GERD, chronic cough presented with malaise, coughing, hemoptysis.  Per patient symptoms started on Sunday almost a week ago when she noticed runny nose, frequent coughing.  2 days later her symptoms got worse with generalized malaise, productive cough with whitish phlegm.  Patient saw her pulmonologist who and was placed on steroid taper, PPI for reflux.   Subsequently she also had some hemoptysis, twice otherwise denied any fevers or chills.  She also reported off-and-on diarrhea. Influenza panel was positive for influenza A   Hospital Course:   Acute Respiratory failure with hypoxia secondary to influenza A -Wheezing much improved, continue Tamiflu to complete full course -Continue prednisone with taper, Symbicort  -Home O2 evaluation prior to discharge  -Outpatient follow-up with her pulmonologist, Dr. [name] scheduled   Hemoptysis -Likely due to tracheobronchitis -CT angiogram of the chest negative for PE or acute findings -Continue current management, no further episodes of hemoptysis  Hypertension Irbesartan/HCTZ combination was discontinued Started on Norvasc 5 mg daily, continue HCTZ 12.5 mg daily   GERD, cough variant asthma -Continue PPI - discontinued irbesartan due to continued cough, which seems to be her main complaint, now improving  Day of Discharge S: Feels better today, no fevers or chills, wheezing has improved.  BP (!) 143/90 (BP Location: Left Arm)   Pulse 74   Temp 97.8 F (36.6 C)   Resp 18   Ht 4\' 10"  (1.473 m)   Wt 76.3 kg   SpO2 99%   BMI 35.16 kg/m   Physical Exam: General: Alert and awake oriented x3 not in any acute distress. HEENT: anicteric sclera, pupils reactive to light and accommodation CVS: S1-S2 clear no murmur rubs or gallops Chest: clear to auscultation bilaterally, no wheezing rales or rhonchi Abdomen: soft nontender, nondistended, normal bowel sounds Extremities: no cyanosis, clubbing or edema noted bilaterally Neuro: Cranial nerves II-XII intact, no focal neurological deficits   The results of significant diagnostics from this hospitalization (including imaging, microbiology, ancillary and laboratory) are listed below for reference.      Procedures/Studies:  Dg Chest 2 View  Result Date: 04/11/2018 CLINICAL DATA:  Pt c/o chronic cough x 1 year, hemoptysis onset this a.m.  that was dark red. H/o HTN and Asthma. EXAM: CHEST - 2 VIEW COMPARISON:  03/26/2018 FINDINGS: The heart size and mediastinal contours are within normal limits. Both lungs are clear. No pleural effusion or pneumothorax. The visualized skeletal structures are unremarkable. IMPRESSION: No active cardiopulmonary disease. Electronically Signed   By: Amie Portland M.D.   On: 04/11/2018 12:27   Dg Chest 2 View  Result Date: 03/26/2018 CLINICAL DATA:  Cough variant asthma.  History of hypertension. EXAM: CHEST - 2 VIEW COMPARISON:  12/15/2016 FINDINGS: No significant degree of airway thickening is appreciated radiographically. The lungs appear clear. Cardiac and mediastinal margins appear normal. No pleural effusion. Mild thoracic spondylosis. IMPRESSION: 1.  No active cardiopulmonary disease is radiographically apparent. 2. Mild thoracic spondylosis. Electronically Signed   By: Gaylyn Rong M.D.   On: 03/26/2018 17:32   Ct Angio Chest Pe W Or Wo Contrast  Result Date: 04/11/2018 CLINICAL DATA:  Hemoptysis. EXAM: CT ANGIOGRAPHY CHEST WITH CONTRAST TECHNIQUE: Multidetector CT imaging of the chest was performed using the standard protocol during bolus administration of intravenous contrast. Multiplanar CT image reconstructions and MIPs were obtained to evaluate the vascular anatomy. CONTRAST:  ISOVUE-370 IOPAMIDOL (ISOVUE-370) INJECTION 76% COMPARISON:  Chest x-ray earlier today. FINDINGS: Cardiovascular: There is respiratory motion on the study and suboptimal opacification of segmental and subsegmental pulmonary artery branches. No obvious pulmonary embolism. Central pulmonary arteries are normal in caliber. The thoracic aorta is normal in caliber. The heart size is normal. No pericardial fluid identified. No significant calcified coronary artery plaque identified. Mediastinum/Nodes: No enlarged mediastinal, hilar, or axillary lymph nodes. The thyroid gland may be mildly enlarged. Lungs/Pleura: Detailed  evaluation of the lungs is impaired by respiratory motion. There is no obvious evidence of pulmonary edema, consolidation, pneumothorax, nodule or pleural fluid. Upper Abdomen: No acute abnormality. Musculoskeletal: No chest wall abnormality. No acute or significant osseous findings. Review of the MIP images confirms the above findings. IMPRESSION: Limited study due to respiratory motion. No obvious pulmonary embolism or other acute findings. Electronically Signed   By: Irish Lack M.D.   On: 04/11/2018 15:20   Ct Maxillofacial Ltd Wo Cm  Result Date: 04/07/2018 CLINICAL DATA:  63 year old female with paranasal sinus symptoms, cough. EXAM: CT PARANASAL SINUS LIMITED WITHOUT CONTRAST TECHNIQUE: Non-contiguous multidetector CT images of the paranasal sinuses were obtained in a single plane without contrast. COMPARISON:  None. FINDINGS: Negative visible noncontrast brain parenchyma. Negative visible noncontrast orbits and face soft tissues. Visible mastoid air cells are clear. Visible bilateral paranasal sinuses are well pneumatized with no bubbly opacity or sinus fluid level evident. Negative nasal cavity; leftward nasal septal deviation and incidental bilateral concha bullosa. No acute osseous abnormality identified. IMPRESSION: Negative study.  No evidence of paranasal sinusitis. Electronically Signed   By: Odessa Fleming M.D.   On: 04/07/2018 14:34       LAB RESULTS: Basic Metabolic Panel: Recent Labs  Lab 04/13/18 0529 04/14/18 0626  NA 139 137  K 3.6 3.3*  CL 102 97*  CO2 27 30  GLUCOSE 91 89  BUN 18 14  CREATININE 0.86 0.79  CALCIUM 8.6* 8.6*   Liver Function Tests: Recent Labs  Lab 04/12/18 0540  AST 59*  ALT 75*  ALKPHOS 53  BILITOT 0.5  PROT 6.4*  ALBUMIN 3.5   No results for input(s): LIPASE, AMYLASE in the last 168 hours. No results for input(s): AMMONIA in the last 168 hours. CBC: Recent Labs  Lab 04/13/18 0529 04/14/18 0626  WBC 11.9* 12.0*  HGB 10.8* 11.4*  HCT  35.3* 36.3  MCV 95.4 95.5  PLT 176 209   Cardiac Enzymes: Recent Labs  Lab 04/11/18 1151  TROPONINI <0.03   BNP: Invalid input(s): POCBNP CBG: No results for input(s): GLUCAP in the last 168 hours.    Disposition and Follow-up: Discharge Instructions    Diet - low sodium heart healthy   Complete by:  As directed    Increase activity slowly   Complete by:  As directed        DISPOSITION: Home   DISCHARGE FOLLOW-UP Follow-up Information    Renaye Rakers, MD. Schedule an appointment as soon as possible for a visit in 2 week(s).   Specialty:  Family Medicine Contact information: [address] [place] [address] [phone]        Nyoka Cowden, MD Follow up on 04/20/2018.   Specialty:  Pulmonary Disease Why:  at 11:30AM  Contact information: [address] [place] [address] [phone]            Time coordinating discharge:  35 minutes  Signed:   Thad Ranger M.D. [facility] 04/14/2018, 11:36 AM

## 2018-04-14 NOTE — Assessment & Plan Note (Signed)
Continue symbicort. Pt is followed by outpatient pulmonary

## 2018-04-14 NOTE — ED Notes (Addendum)
Pt ambulated to 25 feet with pulse ox. SpO2 dropped to 86%. Pt assisted back into bed and placed on 2L of oxygen. MD aware

## 2018-04-14 NOTE — Assessment & Plan Note (Signed)
Observation bed. Continue with supplemental O2. Will have case manager arrange for short term home O2 and home nebulizer machine.

## 2018-04-15 ENCOUNTER — Encounter (HOSPITAL_COMMUNITY): Payer: Self-pay | Admitting: *Deleted

## 2018-04-15 ENCOUNTER — Other Ambulatory Visit: Payer: Self-pay

## 2018-04-15 DIAGNOSIS — J45991 Cough variant asthma: Secondary | ICD-10-CM

## 2018-04-15 MED ORDER — GUAIFENESIN ER 600 MG PO TB12
600.0000 mg | ORAL_TABLET | Freq: Two times a day (BID) | ORAL | Status: DC
  Administered 2018-04-15 – 2018-04-18 (×8): 600 mg via ORAL
  Filled 2018-04-15 (×8): qty 1

## 2018-04-15 MED ORDER — IPRATROPIUM-ALBUTEROL 0.5-2.5 (3) MG/3ML IN SOLN: 3.0000 mL | Freq: Four times a day (QID) | RESPIRATORY_TRACT | Status: DC | PRN

## 2018-04-15 MED ORDER — OSELTAMIVIR PHOSPHATE 75 MG PO CAPS
75.0000 mg | ORAL_CAPSULE | Freq: Two times a day (BID) | ORAL | Status: DC
  Administered 2018-04-15 – 2018-04-16 (×4): 75 mg via ORAL
  Filled 2018-04-15 (×4): qty 1

## 2018-04-15 MED ORDER — PANTOPRAZOLE SODIUM 40 MG PO TBEC
40.0000 mg | DELAYED_RELEASE_TABLET | Freq: Two times a day (BID) | ORAL | Status: DC
  Administered 2018-04-15 – 2018-04-18 (×7): 40 mg via ORAL
  Filled 2018-04-15 (×7): qty 1

## 2018-04-15 MED ORDER — ALBUTEROL SULFATE (2.5 MG/3ML) 0.083% IN NEBU: 2.5000 mg | INHALATION_SOLUTION | Freq: Four times a day (QID) | RESPIRATORY_TRACT | Status: DC | PRN

## 2018-04-15 MED ORDER — BENZONATATE 100 MG PO CAPS
200.0000 mg | ORAL_CAPSULE | Freq: Three times a day (TID) | ORAL | Status: DC | PRN
  Administered 2018-04-15 – 2018-04-17 (×6): 200 mg via ORAL
  Filled 2018-04-15 (×6): qty 2

## 2018-04-15 MED ORDER — MOMETASONE FURO-FORMOTEROL FUM 100-5 MCG/ACT IN AERO
2.0000 | INHALATION_SPRAY | Freq: Two times a day (BID) | RESPIRATORY_TRACT | Status: DC
  Administered 2018-04-15 – 2018-04-18 (×7): 2 via RESPIRATORY_TRACT
  Filled 2018-04-15: qty 8.8

## 2018-04-15 MED ORDER — ONDANSETRON HCL 4 MG PO TABS: 4.0000 mg | ORAL_TABLET | Freq: Four times a day (QID) | ORAL | Status: DC | PRN

## 2018-04-15 MED ORDER — METHYLPREDNISOLONE SODIUM SUCC 125 MG IJ SOLR
60.0000 mg | Freq: Two times a day (BID) | INTRAMUSCULAR | Status: DC
  Administered 2018-04-15 – 2018-04-17 (×4): 60 mg via INTRAVENOUS
  Filled 2018-04-15 (×4): qty 2

## 2018-04-15 MED ORDER — ENOXAPARIN SODIUM 40 MG/0.4ML ~~LOC~~ SOLN
40.0000 mg | Freq: Every day | SUBCUTANEOUS | Status: DC
  Filled 2018-04-15 (×3): qty 0.4

## 2018-04-15 MED ORDER — AMLODIPINE BESYLATE 5 MG PO TABS
5.0000 mg | ORAL_TABLET | Freq: Every day | ORAL | Status: DC
  Administered 2018-04-15 – 2018-04-18 (×4): 5 mg via ORAL
  Filled 2018-04-15 (×4): qty 1

## 2018-04-15 MED ORDER — PREDNISONE 20 MG PO TABS
40.0000 mg | ORAL_TABLET | Freq: Every day | ORAL | Status: DC
  Administered 2018-04-15: 40 mg via ORAL
  Filled 2018-04-15 (×2): qty 2

## 2018-04-15 MED ORDER — HYDROCHLOROTHIAZIDE 12.5 MG PO CAPS
12.5000 mg | ORAL_CAPSULE | Freq: Every day | ORAL | Status: DC
  Administered 2018-04-15 – 2018-04-18 (×4): 12.5 mg via ORAL
  Filled 2018-04-15 (×4): qty 1

## 2018-04-15 MED ORDER — GABAPENTIN 100 MG PO CAPS
100.0000 mg | ORAL_CAPSULE | Freq: Two times a day (BID) | ORAL | Status: DC
  Administered 2018-04-15 – 2018-04-18 (×8): 100 mg via ORAL
  Filled 2018-04-15 (×8): qty 1

## 2018-04-15 MED ORDER — ONDANSETRON HCL 4 MG/2ML IJ SOLN: 4.0000 mg | Freq: Four times a day (QID) | INTRAMUSCULAR | Status: DC | PRN

## 2018-04-15 MED ORDER — HYDROCOD POLST-CPM POLST ER 10-8 MG/5ML PO SUER
5.0000 mL | Freq: Two times a day (BID) | ORAL | Status: DC | PRN
  Administered 2018-04-15 – 2018-04-17 (×6): 5 mL via ORAL
  Filled 2018-04-15 (×7): qty 5

## 2018-04-15 MED ORDER — ALBUTEROL SULFATE HFA 108 (90 BASE) MCG/ACT IN AERS
2.0000 | INHALATION_SPRAY | Freq: Four times a day (QID) | RESPIRATORY_TRACT | Status: DC | PRN
  Filled 2018-04-15: qty 6.7

## 2018-04-15 MED ORDER — ACETAMINOPHEN 650 MG RE SUPP: 650.0000 mg | Freq: Four times a day (QID) | RECTAL | Status: DC | PRN

## 2018-04-15 MED ORDER — ACETAMINOPHEN 325 MG PO TABS: 650.0000 mg | ORAL_TABLET | Freq: Four times a day (QID) | ORAL | Status: DC | PRN

## 2018-04-15 MED ORDER — UMECLIDINIUM BROMIDE 62.5 MCG/INH IN AEPB
1.0000 | INHALATION_SPRAY | Freq: Every day | RESPIRATORY_TRACT | Status: DC
  Administered 2018-04-15 – 2018-04-18 (×4): 1 via RESPIRATORY_TRACT
  Filled 2018-04-15: qty 7

## 2018-04-15 NOTE — Progress Notes (Signed)
SATURATION QUALIFICATIONS: (This note is used to comply with regulatory documentation for home oxygen)  Patient Saturations on Room Air at Rest = 85%  Patient Saturations on Room Air while Ambulating = 85%  Patient Saturations on 2 Liters of oxygen while Ambulating = 90 - 91 %

## 2018-04-15 NOTE — Progress Notes (Signed)
PROGRESS NOTE  Sue Mitchell  DUK:[id number] DOB: 1955/10/02 DOA: 04/14/2018 PCP: Renaye Rakers, MD  Outpatient Specialists: Pulmonology, Dr. 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 At the time of discharge she reportedly maintained sufficient oxygenation during ambulation, but in the ED she was found to be hypoxic, wheezing, coughing, and weak, and was subsequently readmitted.   Assessment & Plan: Principal Problem:   Acute respiratory failure with hypoxia (HCC) Active Problems:   Cough variant asthma  vs uacs wit vcd   Influenza   Cough  Acute hypoxic respiratory failure due to influenza A and asthma exacerbation:  - Complete 5 days of tamiflu - Continue symbicort  - Continues to be hypoxic even at rest. Will continue inpatient treatment and augment IV steroids to 60mg  q12h.  - Has follow up with pulmonology 2/24  Hemoptysis: CTA chest motion-degraded at last admission, no PE.  - Seems to have resolved.   Hypertension:  - Continue norvasc and HCTZ since recently stopping ARB for concern of exacerbating cough.   GERD:  - Continue PPI  Obesity: BMI 37 - Weight loss recommended  DVT prophylaxis: Lovenox Code Status: Full Family Communication: None at bedside Disposition Plan: Readmitted within hours of recent discharge and remains newly hypoxic. Will require ongoing inpatient treatment.   Consultants:   None  Procedures:   None  Antimicrobials:  Tamiflu   Subjective: Breathing about the same from yesterday, still persistent cough is bothering her. Having chest pain with cough, no further hemoptysis. No exertional chest pain though severely dyspneic even at rest.  Objective: Vitals:   04/15/18 0449 04/15/18 0722 04/15/18 1109 04/15/18 1544  BP: (!) 152/90 (!) 142/80   131/73  Pulse: 67 67  72  Resp: 19 19    Temp: 98 F (36.7 C) 98 F (36.7 C)  98.4 F (36.9 C)  TempSrc: Oral Oral    SpO2: 98%  94% 98%  Weight: 80.4 kg 80.4 kg    Height:  4' 9.99" (1.473 m)     No intake or output data in the 24 hours ending 04/15/18 2022 Filed Weights   04/15/18 0449 04/15/18 0722  Weight: 80.4 kg 80.4 kg    Gen: 63 y.o. female in no distress  Pulm: Somewhat labored with tachypnea, end-expiratory wheezing noted.  CV: Regular rate and rhythm. No murmur, rub, or gallop. No JVD, no pedal edema. GI: Abdomen soft, non-tender, non-distended, with normoactive bowel sounds. No organomegaly or masses felt. Ext: Warm, no deformities Skin: No rashes, lesions no ulcers Neuro: Alert and oriented. No focal neurological deficits. Psych: Judgement and insight appear normal. Mood & affect appropriate.   Data Reviewed: I have personally reviewed following labs and imaging studies  CBC: Recent Labs  Lab 04/11/18 1151 04/12/18 0540 04/13/18 0529 04/14/18 0626 04/14/18 2119  WBC 10.8* 10.4 11.9* 12.0* 11.5*  NEUTROABS  --   --   --   --  7.4  HGB 12.3 11.4* 10.8* 11.4* 12.3  HCT 38.7 36.2 35.3* 36.3 39.0  MCV 94.4 93.5 95.4 95.5 94.7  PLT 166 178 176 209 242   Basic Metabolic Panel: Recent Labs  Lab 04/11/18 1151 04/12/18 0540 04/13/18 0529 04/14/18 0626 04/14/18 2119  NA 138 139 139 137 138  K 3.1* 3.6 3.6 3.3* 3.6  CL 100 99 102 97* 97*  CO2  28 30 27 30 30   GLUCOSE 107* 93 91 89 181*  BUN 13 13 18 14 14   CREATININE 0.86 0.85 0.86 0.79 0.87  CALCIUM 8.9 8.7* 8.6* 8.6* 9.2   GFR: Estimated Creatinine Clearance: 60 mL/min (by C-G formula based on SCr of 0.87 mg/dL). Liver Function Tests: Recent Labs  Lab 04/12/18 0540  AST 59*  ALT 75*  ALKPHOS 53  BILITOT 0.5  PROT 6.4*  ALBUMIN 3.5   No results for input(s): LIPASE, AMYLASE in the last 168 hours. No results for input(s): AMMONIA in the last 168 hours. Coagulation Profile: No results for  input(s): INR, PROTIME in the last 168 hours. Cardiac Enzymes: Recent Labs  Lab 04/11/18 1151  TROPONINI <0.03   BNP (last 3 results) No results for input(s): PROBNP in the last 8760 hours. HbA1C: No results for input(s): HGBA1C in the last 72 hours. CBG: No results for input(s): GLUCAP in the last 168 hours. Lipid Profile: No results for input(s): CHOL, HDL, LDLCALC, TRIG, CHOLHDL, LDLDIRECT in the last 72 hours. Thyroid Function Tests: No results for input(s): TSH, T4TOTAL, FREET4, T3FREE, THYROIDAB in the last 72 hours. Anemia Panel: No results for input(s): VITAMINB12, FOLATE, FERRITIN, TIBC, IRON, RETICCTPCT in the last 72 hours. Urine analysis:    Component Value Date/Time   LABSPEC 1.015 06/10/2013 1118   PHURINE 7.0 06/10/2013 1118   GLUCOSEU NEGATIVE 06/10/2013 1118   HGBUR TRACE (A) 06/10/2013 1118   BILIRUBINUR NEGATIVE 06/10/2013 1118   KETONESUR NEGATIVE 06/10/2013 1118   PROTEINUR NEGATIVE 06/10/2013 1118   UROBILINOGEN 0.2 06/10/2013 1118   NITRITE NEGATIVE 06/10/2013 1118   LEUKOCYTESUR NEGATIVE 06/10/2013 1118   No results found for this or any previous visit (from the past 240 hour(s)).    Radiology Studies: No results found.  Scheduled Meds: . amLODipine  5 mg Oral Daily  . enoxaparin (LOVENOX) injection  40 mg Subcutaneous Daily  . gabapentin  100 mg Oral BID  . guaiFENesin  600 mg Oral BID  . hydrochlorothiazide  12.5 mg Oral Daily  . mometasone-formoterol  2 puff Inhalation BID  . oseltamivir  75 mg Oral BID  . pantoprazole  40 mg Oral BID AC  . predniSONE  40 mg Oral Q breakfast  . umeclidinium bromide  1 puff Inhalation Daily   Continuous Infusions:   LOS: 0 days   Time spent: 25 minutes.  Tyrone Nine, MD [facility] [email or web address] Password [facility] 04/15/2018, 8:22 PM

## 2018-04-15 NOTE — ED Notes (Signed)
ED TO INPATIENT HANDOFF REPORT  Name/Age/Gender Sue Mitchell 63 y.o. female  Code Status    Code Status Orders  (From admission, onward)         Start     Ordered   04/15/18 0004  Full code  Continuous     04/15/18 0003        Code Status History    Date Active Date Inactive Code Status Order ID Comments User Context   04/14/2018 2336 04/15/2018 0003 Full Code [id number]  Carollee Herter, DO ED   04/11/2018 1432 04/14/2018 1817 Full Code [id number]  Zigmund Daniel., MD ED      Home/SNF/Other Home  Chief Complaint shortness of breath  Level of Care/Admitting Diagnosis ED Disposition    ED Disposition Condition Comment   Admit  Hospital Area: [facility] [[id number]]  Level of Care: Med-Surg [16]  Diagnosis: Acute respiratory failure with hypoxia [facility]) [168372]  Admitting Physician: Imogene Burn ERIC [[id number]]  Attending Physician: Imogene Burn, ERIC [[id number]]  PT Class (Do Not Modify): Observation [104]  PT Acc Code (Do Not Modify): Observation [10022]       Medical History Past Medical History:  Diagnosis Date  . Abdominal distension   . Abdominal pain   . Arthritis   . Asthma   . Biliary colic   . COPD (chronic obstructive pulmonary disease) (HCC)   . Diarrhea   . GERD (gastroesophageal reflux disease)   . Hypertension   . Nausea     Allergies Allergies  Allergen Reactions  . Codeine Hives, Itching and Other (See Comments)    All over the body, including burning sensation.  . Ciprofloxacin Palpitations    IV Location/Drains/Wounds Patient Lines/Drains/Airways Status   Active Line/Drains/Airways    Name:   Placement date:   Placement time:   Site:   Days:   Peripheral IV 04/14/18 Left;Lateral Antecubital   04/14/18    2128    Antecubital   1          Labs/Imaging Results for orders placed or performed during the hospital encounter of 04/14/18 (from the past 48 hour(s))  Basic metabolic panel     Status: Abnormal   Collection Time: 04/14/18   9:19 PM  Result Value Ref Range   Sodium 138 135 - 145 mmol/L   Potassium 3.6 3.5 - 5.1 mmol/L   Chloride 97 (L) 98 - 111 mmol/L   CO2 30 22 - 32 mmol/L   Glucose, Bld 181 (H) 70 - 99 mg/dL   BUN 14 8 - 23 mg/dL   Creatinine, Ser 9.02 0.44 - 1.00 mg/dL   Calcium 9.2 8.9 - 11.1 mg/dL   GFR calc non Af Amer >60 >60 mL/min   GFR calc Af Amer >60 >60 mL/min   Anion gap 11 5 - 15    Comment: Performed at [facility], [address], [place], [place] [address]  CBC with Differential     Status: Abnormal   Collection Time: 04/14/18  9:19 PM  Result Value Ref Range   WBC 11.5 (H) 4.0 - 10.5 K/uL   RBC 4.12 3.87 - 5.11 MIL/uL   Hemoglobin 12.3 12.0 - 15.0 g/dL   HCT 02.2 33.6 - 12.2 %   MCV 94.7 80.0 - 100.0 fL   MCH 29.9 26.0 - 34.0 pg   MCHC 31.5 30.0 - 36.0 g/dL   RDW 44.9 75.3 - 00.5 %   Platelets 242 150 - 400 K/uL   nRBC 0.0  0.0 - 0.2 %   Neutrophils Relative % 65 %   Neutro Abs 7.4 1.7 - 7.7 K/uL   Lymphocytes Relative 24 %   Lymphs Abs 2.8 0.7 - 4.0 K/uL   Monocytes Relative 10 %   Monocytes Absolute 1.1 (H) 0.1 - 1.0 K/uL   Eosinophils Relative 0 %   Eosinophils Absolute 0.0 0.0 - 0.5 K/uL   Basophils Relative 0 %   Basophils Absolute 0.0 0.0 - 0.1 K/uL   Immature Granulocytes 1 %   Abs Immature Granulocytes 0.10 (H) 0.00 - 0.07 K/uL    Comment: Performed at [facility], [address] [place], [place] [address]   No results found.  Pending Labs Unresulted Labs (From admission, onward)   None      Vitals/Pain Today's Vitals   04/14/18 2130 04/14/18 2312 04/14/18 2313 04/15/18 0000  BP: (!) 148/91 (!) 159/82 (!) 159/82 (!) 162/92  Pulse: 92 82 80 72  Resp: 20 19 (!) 21 18  Temp:      TempSrc:      SpO2: 97% 96% 97% 97%  PainSc:        Isolation Precautions Droplet precaution  Medications Medications  methylPREDNISolone sodium succinate (SOLU-MEDROL) 125 mg/2 mL injection 125 mg (has no administration in time  range)  albuterol (PROVENTIL HFA;VENTOLIN HFA) 108 (90 Base) MCG/ACT inhaler 2 puff (has no administration in time range)  Tiotropium Bromide Monohydrate AERS 2 puff (has no administration in time range)  pantoprazole (PROTONIX) EC tablet 40 mg (has no administration in time range)  gabapentin (NEURONTIN) capsule 100 mg (has no administration in time range)  oseltamivir (TAMIFLU) capsule 75 mg (has no administration in time range)  amLODipine (NORVASC) tablet 5 mg (has no administration in time range)  hydrochlorothiazide (MICROZIDE) capsule 12.5 mg (has no administration in time range)  chlorpheniramine-HYDROcodone (TUSSIONEX) 10-8 MG/5ML suspension 5 mL (has no administration in time range)  benzonatate (TESSALON) capsule 200 mg (has no administration in time range)  predniSONE (DELTASONE) tablet 40 mg (has no administration in time range)  mometasone-formoterol (DULERA) 100-5 MCG/ACT inhaler 2 puff (2 puffs Inhalation Not Given 04/15/18 0007)  ipratropium-albuterol (DUONEB) 0.5-2.5 (3) MG/3ML nebulizer solution 3 mL (has no administration in time range)  enoxaparin (LOVENOX) injection 40 mg (has no administration in time range)  acetaminophen (TYLENOL) tablet 650 mg (has no administration in time range)    Or  acetaminophen (TYLENOL) suppository 650 mg (has no administration in time range)  ondansetron (ZOFRAN) tablet 4 mg (has no administration in time range)    Or  ondansetron (ZOFRAN) injection 4 mg (has no administration in time range)  guaiFENesin (MUCINEX) 12 hr tablet 600 mg (has no administration in time range)  ipratropium-albuterol (DUONEB) 0.5-2.5 (3) MG/3ML nebulizer solution 3 mL (3 mLs Nebulization Given 04/14/18 2113)    Mobility walks

## 2018-04-16 MED ORDER — SENNA 8.6 MG PO TABS
1.0000 | ORAL_TABLET | Freq: Every day | ORAL | Status: DC
  Administered 2018-04-16 – 2018-04-18 (×3): 8.6 mg via ORAL
  Filled 2018-04-16 (×3): qty 1

## 2018-04-16 MED ORDER — IPRATROPIUM-ALBUTEROL 0.5-2.5 (3) MG/3ML IN SOLN
3.0000 mL | Freq: Three times a day (TID) | RESPIRATORY_TRACT | Status: DC
  Administered 2018-04-16: 3 mL via RESPIRATORY_TRACT
  Filled 2018-04-16: qty 3

## 2018-04-16 MED ORDER — ALBUTEROL SULFATE (2.5 MG/3ML) 0.083% IN NEBU
2.5000 mg | INHALATION_SOLUTION | Freq: Three times a day (TID) | RESPIRATORY_TRACT | Status: DC
  Administered 2018-04-16 – 2018-04-18 (×5): 2.5 mg via RESPIRATORY_TRACT
  Filled 2018-04-16 (×5): qty 3

## 2018-04-16 MED ORDER — ALBUTEROL SULFATE (2.5 MG/3ML) 0.083% IN NEBU
2.5000 mg | INHALATION_SOLUTION | RESPIRATORY_TRACT | Status: DC | PRN
  Administered 2018-04-16: 2.5 mg via RESPIRATORY_TRACT
  Filled 2018-04-16: qty 3

## 2018-04-16 NOTE — Progress Notes (Signed)
PROGRESS NOTE  Sue Mitchell  YVO:[id number] DOB: 1956/01/21 DOA: 04/14/2018 PCP: Renaye Rakers, MD  Outpatient Specialists: Pulmonology, Dr. 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 At the time of discharge she reportedly maintained sufficient oxygenation during ambulation, but in the ED she was found to be hypoxic, wheezing, coughing, and weak, and was subsequently readmitted.   Assessment & Plan: Principal Problem:   Acute respiratory failure with hypoxia (HCC) Active Problems:   Cough variant asthma  vs uacs wit vcd   Influenza   Cough  Acute hypoxic respiratory failure due to influenza A and asthma exacerbation:  - Complete 5 days of tamiflu - Continue symbicort, incruse - Schedule albuterol - Continue IV steroids augmented last night.   - Has follow up with pulmonology 2/24.  - Continue supplemental oxygen to maintain SpO2 >90%. May need O2 at discharge, will definitely need nebulizer at DC. These have been ordered and arranged by CM.  Hemoptysis: CTA chest motion-degraded at last admission, no PE.  - Seems to have resolved.   Hypertension:  - Continue norvasc and HCTZ since recently stopping ARB for concern of exacerbating cough.   GERD:  - Continue PPI  Obesity: BMI 37 - Weight loss recommended  DVT prophylaxis: Lovenox Code Status: Full Family Communication: None at bedside Disposition Plan: Readmitted within hours of recent discharge and remains newly hypoxic. Continue IV steroids given persistently abnormal lung exam. Consider pulmonology consultation if not improving.  Consultants:   None  Procedures:   None  Antimicrobials:  Tamiflu to complete 5 days Tx.   Subjective: Feels she's making some slow improvement in dyspnea, still some wheezing but hasn't  asked for any breathing treatments. Walked with RN and desaturated requiring 3L O2. No chest pain. Cough becoming more productive.    Objective: Vitals:   04/15/18 2034 04/16/18 0548 04/16/18 0812 04/16/18 1054  BP: (!) 141/88 128/74    Pulse: 75 69 68   Resp: 19 20 17    Temp: 98 F (36.7 C) 98.3 F (36.8 C)    TempSrc:      SpO2: 95% 96% 98% 90%  Weight:      Height:       No intake or output data in the 24 hours ending 04/16/18 1344 Filed Weights   04/15/18 0449 04/15/18 0722  Weight: 80.4 kg 80.4 kg   Gen: 63 y.o. female in no distress Pulm: Nonlabored but remains tachypneic with some upper airway wheezing as well as prolonged expiration with end-expiratory wheezing. Clear. CV: Regular rate and rhythm. No murmur, rub, or gallop. No JVD, no dependent edema. GI: Abdomen soft, non-tender, non-distended, with normoactive bowel sounds.  Ext: Warm, no deformities Skin: No rashes, lesions or ulcers on visualized skin. Neuro: Alert and oriented. No focal neurological deficits. Psych: Judgement and insight appear fair. Mood euthymic & affect congruent. Behavior is appropriate.    Data Reviewed: I have personally reviewed following labs and imaging studies  CBC: Recent Labs  Lab 04/11/18 1151 04/12/18 0540 04/13/18 0529 04/14/18 0626 04/14/18 2119  WBC 10.8* 10.4 11.9* 12.0* 11.5*  NEUTROABS  --   --   --   --  7.4  HGB 12.3 11.4* 10.8* 11.4* 12.3  HCT 38.7 36.2 35.3* 36.3 39.0  MCV 94.4 93.5 95.4 95.5 94.7  PLT 166 178 176 209 242  Basic Metabolic Panel: Recent Labs  Lab 04/11/18 1151 04/12/18 0540 04/13/18 0529 04/14/18 0626 04/14/18 2119  NA 138 139 139 137 138  K 3.1* 3.6 3.6 3.3* 3.6  CL 100 99 102 97* 97*  CO2 28 30 27 30 30   GLUCOSE 107* 93 91 89 181*  BUN 13 13 18 14 14   CREATININE 0.86 0.85 0.86 0.79 0.87  CALCIUM 8.9 8.7* 8.6* 8.6* 9.2   GFR: Estimated Creatinine Clearance: 60 mL/min (by C-G formula based on SCr of 0.87 mg/dL). Liver Function  Tests: Recent Labs  Lab 04/12/18 0540  AST 59*  ALT 75*  ALKPHOS 53  BILITOT 0.5  PROT 6.4*  ALBUMIN 3.5   Cardiac Enzymes: Recent Labs  Lab 04/11/18 1151  TROPONINI <0.03   Urine analysis:    Component Value Date/Time   LABSPEC 1.015 06/10/2013 1118   PHURINE 7.0 06/10/2013 1118   GLUCOSEU NEGATIVE 06/10/2013 1118   HGBUR TRACE (A) 06/10/2013 1118   BILIRUBINUR NEGATIVE 06/10/2013 1118   KETONESUR NEGATIVE 06/10/2013 1118   PROTEINUR NEGATIVE 06/10/2013 1118   UROBILINOGEN 0.2 06/10/2013 1118   NITRITE NEGATIVE 06/10/2013 1118   LEUKOCYTESUR NEGATIVE 06/10/2013 1118   No results found for this or any previous visit (from the past 240 hour(s)).    Radiology Studies: No results found.  Scheduled Meds: . albuterol  2.5 mg Nebulization TID  . amLODipine  5 mg Oral Daily  . enoxaparin (LOVENOX) injection  40 mg Subcutaneous Daily  . gabapentin  100 mg Oral BID  . guaiFENesin  600 mg Oral BID  . hydrochlorothiazide  12.5 mg Oral Daily  . methylPREDNISolone (SOLU-MEDROL) injection  60 mg Intravenous Q12H  . mometasone-formoterol  2 puff Inhalation BID  . pantoprazole  40 mg Oral BID AC  . senna  1 tablet Oral Daily  . umeclidinium bromide  1 puff Inhalation Daily   Continuous Infusions:   LOS: 1 day   Time spent: 25 minutes.  Tyrone Nine, MD [facility] [email or web address] Password [email or web address] 04/16/2018, 1:44 PM

## 2018-04-16 NOTE — Progress Notes (Signed)
SATURATION QUALIFICATIONS: (This note is used to comply with regulatory documentation for home oxygen)  Patient Saturations on Room Air at Rest = 81%  Patient Saturations on Room Air while Ambulating = 81%  Patient Saturations on 2 Liters of oxygen while Ambulating = 86-88%  Patient Saturations on 3 Liters of oxygen while Ambulating = 90-92%

## 2018-04-16 NOTE — Care Management Note (Signed)
Case Management Note  Patient Details  Name: Sue Mitchell MRN: [id number] Date of Birth: [birth year]  Subjective/Objective:                  DISCHARGE PLANNING  Action/Plan: DME-o2 for home and nebulizer called to [facility] with [facility] at 1054. o2 perimeters are in the system.  Expected Discharge Date:                  Expected Discharge Plan:  Home w Home Health Services  In-House Referral:     Discharge planning Services  CM Consult  Post Acute Care Choice:  Durable Medical Equipment Choice offered to:     DME Arranged:  Oxygen, Nebulizer machine DME Agency:  [facility]  HH Arranged:    [facility] Agency:     Status of Service:  In process, will continue to follow  If discussed at Long Length of Stay Meetings, dates discussed:    Additional Comments:  Golda Acre, RN 04/16/2018, 10:53 AM

## 2018-04-17 LAB — GLUCOSE, CAPILLARY: Glucose-Capillary: 135 mg/dL — ABNORMAL HIGH (ref 70–99)

## 2018-04-17 MED ORDER — SIMETHICONE 80 MG PO CHEW
80.0000 mg | CHEWABLE_TABLET | Freq: Four times a day (QID) | ORAL | Status: DC | PRN
  Administered 2018-04-17: 80 mg via ORAL
  Filled 2018-04-17: qty 1

## 2018-04-17 MED ORDER — PREDNISONE 20 MG PO TABS
40.0000 mg | ORAL_TABLET | Freq: Every day | ORAL | Status: DC
  Administered 2018-04-18: 40 mg via ORAL
  Filled 2018-04-17: qty 2

## 2018-04-17 NOTE — Progress Notes (Signed)
PROGRESS NOTE  Sue Mitchell  BZM:[id number] DOB: Oct 14, 1955 DOA: 04/14/2018 PCP: Renaye Rakers, MD  Outpatient Specialists: Pulmonology, Dr. 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 At the time of discharge she reportedly maintained sufficient oxygenation during ambulation, but in the ED she was found to be hypoxic, wheezing, coughing, and weak, and was subsequently readmitted.   Assessment & Plan: Principal Problem:   Acute respiratory failure with hypoxia (HCC) Active Problems:   Cough variant asthma  vs uacs wit vcd   Influenza   Cough  Acute hypoxic respiratory failure due to influenza A and asthma exacerbation:  - Completed 5 days of tamiflu - Continue symbicort, incruse - Scheduled and prn albuterol - Wheezing has improved, so stop IV steroids, convert to po starting tomorrow.  - Has follow up with pulmonology 2/24.  - Continue supplemental oxygen to maintain SpO2 >90%. May need O2 at discharge, will definitely need nebulizer at DC. These have been ordered and arranged by CM.  Hemoptysis: CTA chest motion-degraded at last admission, no PE.  - Seems to have resolved.   Hypertension:  - Continue norvasc and HCTZ since recently stopping ARB for concern of exacerbating cough.   GERD:  - Continue PPI  Obesity: BMI 37 - Weight loss recommended  DVT prophylaxis: Lovenox Code Status: Full Family Communication: None at bedside Disposition Plan: Readmitted within hours of recent discharge and remains newly hypoxic. Will transition to po steroids 2/22 and DC if stable thereafter. Has pulmonology f/u Monday.  Consultants:   None  Procedures:   None  Antimicrobials:  Tamiflu: completed 5 days Tx.   Subjective: About the same from yesterday, trying to wean herself from  oxygen, but still requires it with exertion. Dyspnea is moderate, improved from last 48-72 hours. Cough is more productive which helps with dyspnea. No chest pain.   Objective: Vitals:   04/16/18 1928 04/16/18 2102 04/17/18 0455 04/17/18 0820  BP:  (!) 141/70 130/73   Pulse:  87 62 88  Resp:  17 17 18   Temp:  98 F (36.7 C) 98 F (36.7 C)   TempSrc:  Oral Oral   SpO2: 94% 100% 100% (!) 87%  Weight:   81 kg   Height:       No intake or output data in the 24 hours ending 04/17/18 1141 Filed Weights   04/15/18 0449 04/15/18 0722 04/17/18 0455  Weight: 80.4 kg 80.4 kg 81 kg   Gen: 63 y.o. female in no distress Pulm: Nonlabored breathing, prolonged expiration without wheezes/rhonchi 2 hours after last breathing treatment. CV: Regular rate and rhythm. No murmur, rub, or gallop. No JVD, no dependent edema. GI: Abdomen soft, non-tender, non-distended, with normoactive bowel sounds.  Ext: Warm, no deformities Skin: No rashes, lesions or ulcers on visualized skin. Neuro: Alert and oriented. No focal neurological deficits. Psych: Judgement and insight appear fair. Mood euthymic & affect congruent. Behavior is appropriate.    Data Reviewed: I have personally reviewed following labs and imaging studies  CBC: Recent Labs  Lab 04/11/18 1151 04/12/18 0540 04/13/18 0529 04/14/18 0626 04/14/18 2119  WBC 10.8* 10.4 11.9* 12.0* 11.5*  NEUTROABS  --   --   --   --  7.4  HGB 12.3 11.4* 10.8* 11.4* 12.3  HCT 38.7 36.2 35.3* 36.3 39.0  MCV 94.4 93.5 95.4 95.5  94.7  PLT 166 178 176 209 242   Basic Metabolic Panel: Recent Labs  Lab 04/11/18 1151 04/12/18 0540 04/13/18 0529 04/14/18 0626 04/14/18 2119  NA 138 139 139 137 138  K 3.1* 3.6 3.6 3.3* 3.6  CL 100 99 102 97* 97*  CO2 28 30 27 30 30   GLUCOSE 107* 93 91 89 181*  BUN 13 13 18 14 14   CREATININE 0.86 0.85 0.86 0.79 0.87  CALCIUM 8.9 8.7* 8.6* 8.6* 9.2   GFR: Estimated Creatinine Clearance: 60.2 mL/min (by C-G formula based  on SCr of 0.87 mg/dL). Liver Function Tests: Recent Labs  Lab 04/12/18 0540  AST 59*  ALT 75*  ALKPHOS 53  BILITOT 0.5  PROT 6.4*  ALBUMIN 3.5   Cardiac Enzymes: Recent Labs  Lab 04/11/18 1151  TROPONINI <0.03   Urine analysis:    Component Value Date/Time   LABSPEC 1.015 06/10/2013 1118   PHURINE 7.0 06/10/2013 1118   GLUCOSEU NEGATIVE 06/10/2013 1118   HGBUR TRACE (A) 06/10/2013 1118   BILIRUBINUR NEGATIVE 06/10/2013 1118   KETONESUR NEGATIVE 06/10/2013 1118   PROTEINUR NEGATIVE 06/10/2013 1118   UROBILINOGEN 0.2 06/10/2013 1118   NITRITE NEGATIVE 06/10/2013 1118   LEUKOCYTESUR NEGATIVE 06/10/2013 1118   No results found for this or any previous visit (from the past 240 hour(s)).    Radiology Studies: No results found.  Scheduled Meds: . albuterol  2.5 mg Nebulization TID  . amLODipine  5 mg Oral Daily  . enoxaparin (LOVENOX) injection  40 mg Subcutaneous Daily  . gabapentin  100 mg Oral BID  . guaiFENesin  600 mg Oral BID  . hydrochlorothiazide  12.5 mg Oral Daily  . mometasone-formoterol  2 puff Inhalation BID  . pantoprazole  40 mg Oral BID AC  . [START ON 04/18/2018] predniSONE  40 mg Oral Q breakfast  . senna  1 tablet Oral Daily  . umeclidinium bromide  1 puff Inhalation Daily   Continuous Infusions:   LOS: 2 days   Time spent: 25 minutes.  Tyrone Nine, MD [facility] [email or web address] Password [facility] 04/17/2018, 11:41 AM

## 2018-04-18 MED ORDER — ALBUTEROL SULFATE (2.5 MG/3ML) 0.083% IN NEBU: 2.5000 mg | INHALATION_SOLUTION | Freq: Four times a day (QID) | RESPIRATORY_TRACT | 0 refills | Status: DC | PRN

## 2018-04-18 MED ORDER — HYDROCOD POLST-CPM POLST ER 10-8 MG/5ML PO SUER: 5.0000 mL | Freq: Two times a day (BID) | ORAL | 0 refills | Status: DC | PRN

## 2018-04-18 NOTE — Progress Notes (Addendum)
Patient has discharged to home on 04/18/2018. Discharge instruction including medication and appointment was given to patient. Patient has no question at this time. Patient stated that she knew how to call [facility] to get her oxygen and nebulizer delivery to her house and she could left when discharge paperwork done.

## 2018-04-18 NOTE — Discharge Summary (Signed)
Physician Discharge Summary  Sue Mitchell ZOX:[id number] DOB: 16-Jul-1955 DOA: 04/14/2018  PCP: Renaye Rakers, MD  Admit date: 04/14/2018 Discharge date: 04/18/2018  Admitted From: Home Disposition: Home   Recommendations for Outpatient Follow-up:  1. Follow up with PCP in 1-2 weeks 2. Please obtain BMP/CBC in one week 3. Follow up with pulmonology as scheduled 2/24.  Home Health: None new Equipment/Devices: Nebulizer with albuterol sol'n, 2L oxygen ordered several days prior to discharge. Apparently patient left stating she would get the nebulizer and oxygen from [facility] and did not want to wait for it to be delivered to the room per care management. Per CM note, this will be delivered to the home. 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 At the time of discharge she reportedly maintained sufficient oxygenation during ambulation, but in the ED she was found to be hypoxic, wheezing, coughing, and weak, and was subsequently readmitted. With continued treatment including IV steroids she's subjectively significantly improved. She was weaned from oxygen while at rest and still desaturates on ambulation. She is discharged with DME in stable condition with follow up in 48 hours.  Discharge Diagnoses:  Principal Problem:   Acute respiratory failure with hypoxia (HCC) Active Problems:   Cough variant asthma  vs uacs wit vcd   Influenza   Cough  Acute hypoxic respiratory failure due to influenza A and asthma exacerbation:  - Completed 5 days of tamiflu - Continue symbicort, spiriva respimat - Scheduled and prn albuterol nebulizer (provided at DC) - Wheezing has improved so converted to po steroid  with improvement, has prednisone at home, ordered to take  po daily until follow up.  - Continue supplemental oxygen to maintain SpO2 >90%. Despite significant improvement, does still need O2 at discharge. Will anticipate continued improvement and liberation from oxygen, will need recheck at follow up office visit with pulmonology the coming week. - Continue antitussive. PMPAware queried, has gotten hydrocodone-APAP from Dr. [name] 2/12 and cough syrup intermittently from PCP.   Hemoptysis: CTA chest motion-degraded at last admission, no PE.  - Resolved.   Hypertension:  - Continue norvasc and HCTZ since recently stopping ARB for concern of exacerbating cough.   GERD:  - Continue PPI  Obesity: BMI 37 - Weight loss recommended  Discharge Instructions Discharge Instructions    Diet - low sodium heart healthy   Complete by:  As directed    Discharge instructions   Complete by:  As directed    You were admitted for hypoxia due to the flu. Treatment for the flu is completed and you are showing signs of improvement as expected. It's unclear how long the improvement will take from here, but you may be discharged on supplemental oxygen to recover at home with the following recommendations:  - Continue taking breathing medications as you were and begin using the nebulizer instead of the inhaler to administer albuterol as needed. This needs to be provided/arranged prior to discharge.  - Continue oxygen when you walk until you follow up with Dr. [name].  - Also continue prednisone  daily until you see Dr. [name] for further direction. - Cough medication has been sent to your pharmacy. - Please seek medical attention if your symptoms worsen.   Increase activity slowly   Complete  by:  As directed      Allergies as of 04/18/2018      Reactions   Codeine Hives, Itching, Other (See Comments)   All over the body, including burning sensation.   Ciprofloxacin Palpitations      Medication  List    STOP taking these medications   HYDROcodone-acetaminophen 5-325 MG tablet Commonly known as:  NORCO/VICODIN   oseltamivir 75 MG capsule Commonly known as:  TAMIFLU     TAKE these medications   acyclovir 200 MG capsule Commonly known as:  ZOVIRAX Take 200 mg by mouth 2 (two) times daily as needed (outbreaks).   albuterol 108 (90 Base) MCG/ACT inhaler Commonly known as:  PROVENTIL HFA;VENTOLIN HFA Inhale 2 puffs into the lungs every 6 (six) hours as needed for wheezing or shortness of breath. What changed:  Another medication with the same name was added. Make sure you understand how and when to take each.   albuterol (2.5 MG/3ML) 0.083% nebulizer solution Commonly known as:  PROVENTIL Take 3 mLs (2.5 mg total) by nebulization every 6 (six) hours as needed for wheezing or shortness of breath. What changed:  You were already taking a medication with the same name, and this prescription was added. Make sure you understand how and when to take each.   amLODipine 5 MG tablet Commonly known as:  NORVASC Take 1 tablet (5 mg total) by mouth daily.   benzonatate 200 MG capsule Commonly known as:  TESSALON Take 1 capsule (200 mg total) by mouth 3 (three) times daily as needed for cough.   budesonide-formoterol 80-4.5 MCG/ACT inhaler Commonly known as:  SYMBICORT Inhale 2 puffs into the lungs 2 (two) times daily for 1 day.   chlorpheniramine-HYDROcodone 10-8 MG/5ML Suer Commonly known as:  TUSSIONEX Take 5 mLs by mouth every 12 (twelve) hours as needed for cough.   DULERA 100-5 MCG/ACT Aero Generic drug:  mometasone-formoterol Inhale 2 puffs into the lungs 2 (two) times daily.   gabapentin 100 MG capsule Commonly known as:  NEURONTIN Take 100 mg by mouth 2 (two) times daily.   hydrochlorothiazide 12.5 MG capsule Commonly known as:  MICROZIDE Take 1 capsule (12.5 mg total) by mouth daily.   pantoprazole 40 MG tablet Commonly known as:  PROTONIX Take 30- 60 min before  your first and last meals of the day What changed:    how much to take  how to take this  when to take this  additional instructions   predniSONE 10 MG tablet Commonly known as:  DELTASONE Prednisone dosing: Take  Prednisone 40mg  (4 tabs) x 3 days, then taper to 30mg  (3 tabs) x 3 days, then 20mg  (2 tabs) x 3days, then 10mg  (1 tab) x 3days, then OFF.  Dispense:  30 tabs, refills: None   Tiotropium Bromide Monohydrate 2.5 MCG/ACT Aers Commonly known as:  SPIRIVA RESPIMAT Inhale 2 puffs into the lungs daily.            Durable Medical Equipment  (From admission, onward)         Start     Ordered   04/18/18 0838  DME Oxygen  Once    Question Answer Comment  Mode or (Route) Nasal cannula   Liters per Minute 2   Frequency Continuous (stationary and portable oxygen unit needed)   Oxygen delivery system Gas      04/18/18 0839   04/16/18 1344  For home use only DME Nebulizer/meds  Once    Question Answer Comment  Patient  needs a nebulizer to treat with the following condition Asthma   Patient needs a nebulizer to treat with the following condition Asthma exacerbation   Patient needs a nebulizer to treat with the following condition Acute respiratory failure with hypoxia (HCC)      04/16/18 1344   04/16/18 1344  For home use only DME oxygen  Once    Question Answer Comment  Mode or (Route) Nasal cannula   Liters per Minute 2   Frequency Continuous (stationary and portable oxygen unit needed)   Oxygen delivery system Gas      04/16/18 1344         Follow-up Information    Renaye Rakers, MD. Schedule an appointment as soon as possible for a visit in 2 week(s).   Specialty:  Family Medicine Contact information: [address] [place] [address] [phone]        Nyoka Cowden, MD. Go on 04/20/2018.   Specialty:  Pulmonary Disease Contact information: [address] [place] [address] [phone]          Allergies  Allergen  Reactions  . Codeine Hives, Itching and Other (See Comments)    All over the body, including burning sensation.  . Ciprofloxacin Palpitations    Consultations:  None  Procedures/Studies: Dg Chest 2 View  Result Date: 04/11/2018 CLINICAL DATA:  Pt c/o chronic cough x 1 year, hemoptysis onset this a.m. that was dark red. H/o HTN and Asthma. EXAM: CHEST - 2 VIEW COMPARISON:  03/26/2018 FINDINGS: The heart size and mediastinal contours are within normal limits. Both lungs are clear. No pleural effusion or pneumothorax. The visualized skeletal structures are unremarkable. IMPRESSION: No active cardiopulmonary disease. Electronically Signed   By: Amie Portland M.D.   On: 04/11/2018 12:27   Dg Chest 2 View  Result Date: 03/26/2018 CLINICAL DATA:  Cough variant asthma.  History of hypertension. EXAM: CHEST - 2 VIEW COMPARISON:  12/15/2016 FINDINGS: No significant degree of airway thickening is appreciated radiographically. The lungs appear clear. Cardiac and mediastinal margins appear normal. No pleural effusion. Mild thoracic spondylosis. IMPRESSION: 1.  No active cardiopulmonary disease is radiographically apparent. 2. Mild thoracic spondylosis. Electronically Signed   By: Gaylyn Rong M.D.   On: 03/26/2018 17:32   Ct Angio Chest Pe W Or Wo Contrast  Result Date: 04/11/2018 CLINICAL DATA:  Hemoptysis. EXAM: CT ANGIOGRAPHY CHEST WITH CONTRAST TECHNIQUE: Multidetector CT imaging of the chest was performed using the standard protocol during bolus administration of intravenous contrast. Multiplanar CT image reconstructions and MIPs were obtained to evaluate the vascular anatomy. CONTRAST:  ISOVUE-370 IOPAMIDOL (ISOVUE-370) INJECTION 76% COMPARISON:  Chest x-ray earlier today. FINDINGS: Cardiovascular: There is respiratory motion on the study and suboptimal opacification of segmental and subsegmental pulmonary artery branches. No obvious pulmonary embolism. Central pulmonary arteries are normal  in caliber. The thoracic aorta is normal in caliber. The heart size is normal. No pericardial fluid identified. No significant calcified coronary artery plaque identified. Mediastinum/Nodes: No enlarged mediastinal, hilar, or axillary lymph nodes. The thyroid gland may be mildly enlarged. Lungs/Pleura: Detailed evaluation of the lungs is impaired by respiratory motion. There is no obvious evidence of pulmonary edema, consolidation, pneumothorax, nodule or pleural fluid. Upper Abdomen: No acute abnormality. Musculoskeletal: No chest wall abnormality. No acute or significant osseous findings. Review of the MIP images confirms the above findings. IMPRESSION: Limited study due to respiratory motion. No obvious pulmonary embolism or other acute findings. Electronically Signed  By: Irish Lack M.D.   On: 04/11/2018 15:20   Ct Maxillofacial Ltd Wo Cm  Result Date: 04/07/2018 CLINICAL DATA:  63 year old female with paranasal sinus symptoms, cough. EXAM: CT PARANASAL SINUS LIMITED WITHOUT CONTRAST TECHNIQUE: Non-contiguous multidetector CT images of the paranasal sinuses were obtained in a single plane without contrast. COMPARISON:  None. FINDINGS: Negative visible noncontrast brain parenchyma. Negative visible noncontrast orbits and face soft tissues. Visible mastoid air cells are clear. Visible bilateral paranasal sinuses are well pneumatized with no bubbly opacity or sinus fluid level evident. Negative nasal cavity; leftward nasal septal deviation and incidental bilateral concha bullosa. No acute osseous abnormality identified. IMPRESSION: Negative study.  No evidence of paranasal sinusitis. Electronically Signed   By: Odessa Fleming M.D.   On: 04/07/2018 14:34     Subjective: Feels she's breathing much better. At rest she's at baseline, still some shortness of breath, easier fatigue when walking but feels well enough to go home. No more wheezing, cough is improving. Requests cough medication at discharge.    Discharge Exam: Vitals:   04/17/18 2122 04/18/18 0532  BP:  129/71  Pulse:  74  Resp:  18  Temp:  97.9 F (36.6 C)  SpO2: 94% 95%   General: Pt is alert, awake, not in acute distress Cardiovascular: RRR, S1/S2 +, no rubs, no gallops Respiratory: Nonlabored on room air at rest, no wheezes or crackles. Abdominal: Soft, NT, ND, bowel sounds + Extremities: No edema, no cyanosis  Labs: Basic Metabolic Panel: Recent Labs  Lab 04/11/18 1151 04/12/18 0540 04/13/18 0529 04/14/18 0626 04/14/18 2119  NA 138 139 139 137 138  K 3.1* 3.6 3.6 3.3* 3.6  CL 100 99 102 97* 97*  CO2 28 30 27 30 30   GLUCOSE 107* 93 91 89 181*  BUN 13 13 18 14 14   CREATININE 0.86 0.85 0.86 0.79 0.87  CALCIUM 8.9 8.7* 8.6* 8.6* 9.2   Liver Function Tests: Recent Labs  Lab 04/12/18 0540  AST 59*  ALT 75*  ALKPHOS 53  BILITOT 0.5  PROT 6.4*  ALBUMIN 3.5   CBC: Recent Labs  Lab 04/11/18 1151 04/12/18 0540 04/13/18 0529 04/14/18 0626 04/14/18 2119  WBC 10.8* 10.4 11.9* 12.0* 11.5*  NEUTROABS  --   --   --   --  7.4  HGB 12.3 11.4* 10.8* 11.4* 12.3  HCT 38.7 36.2 35.3* 36.3 39.0  MCV 94.4 93.5 95.4 95.5 94.7  PLT 166 178 176 209 242   Cardiac Enzymes: Recent Labs  Lab 04/11/18 1151  TROPONINI <0.03   CBG: Recent Labs  Lab 04/17/18 0814  GLUCAP 135*   Urinalysis    Component Value Date/Time   LABSPEC 1.015 06/10/2013 1118   PHURINE 7.0 06/10/2013 1118   GLUCOSEU NEGATIVE 06/10/2013 1118   HGBUR TRACE (A) 06/10/2013 1118   BILIRUBINUR NEGATIVE 06/10/2013 1118   KETONESUR NEGATIVE 06/10/2013 1118   PROTEINUR NEGATIVE 06/10/2013 1118   UROBILINOGEN 0.2 06/10/2013 1118   NITRITE NEGATIVE 06/10/2013 1118   LEUKOCYTESUR NEGATIVE 06/10/2013 1118    Time coordinating discharge: Approximately 40 minutes  Tyrone Nine, MD  [facility] 04/18/2018, 8:39 AM Pager [phone]

## 2018-04-18 NOTE — Progress Notes (Signed)
SATURATION QUALIFICATIONS: (This note is used to comply with regulatory documentation for home oxygen)  Patient Saturations on Room Air at Rest = 90%  Patient Saturations on Room Air while Ambulating = 86 %  Patient Saturations on 2 Liters of oxygen while Ambulating = 90 - 94%

## 2018-04-18 NOTE — Care Management (Addendum)
Received call from Unit RN and states pt was dc and has not received call from [facility] about delivery of oxygen and neb machine. Contacted [facility] to follow up. Waiting call back. Isidoro Donning RN CCM Case Mgmt phone [phone]  1110 am NCM spoke to pt and made her aware [facility] will deliver oxygen and neb machine to home. Isidoro Donning RN CCM Case Mgmt phone [phone]

## 2018-04-20 ENCOUNTER — Encounter: Payer: Self-pay | Admitting: *Deleted

## 2018-04-20 ENCOUNTER — Ambulatory Visit: Payer: BLUE CROSS/BLUE SHIELD | Admitting: Internal Medicine

## 2018-04-20 ENCOUNTER — Encounter: Payer: Self-pay | Admitting: Internal Medicine

## 2018-04-20 DIAGNOSIS — J45991 Cough variant asthma: Secondary | ICD-10-CM

## 2018-04-20 DIAGNOSIS — J9601 Acute respiratory failure with hypoxia: Secondary | ICD-10-CM | POA: Diagnosis not present

## 2018-04-20 MED ORDER — BUDESONIDE-FORMOTEROL FUMARATE 80-4.5 MCG/ACT IN AERO: 2.0000 | INHALATION_SPRAY | Freq: Two times a day (BID) | RESPIRATORY_TRACT | 0 refills | Status: DC

## 2018-04-20 NOTE — Patient Instructions (Addendum)
Continue pantoprazole 40 mg Take 30- 60 min before your first and last meals of the day   Gabapentin  Restart 100 mg twice daily bfast and bedtime   Plan A = Automatic = Symbicort 80 Take 2 puffs first thing in am and then another 2 puffs about 12 hours later.   Work on inhaler technique:  relax and gently blow all the way out then take a nice smooth deep breath back in, triggering the inhaler at same time you start breathing in.  Hold for up to 5 seconds if you can. Blow symbicort  out thru nose. Rinse and gargle with water when done   Plan B = Backup Only use your albuterol inhaler as a rescue medication to be used if you can't catch your breath by resting or doing a relaxed purse lip breathing pattern.  - The less you use it, the better it will work when you need it. - Ok to use the inhaler up to 2 puffs  every 4 hours if you must but call for appointment if use goes up over your usual need - Don't leave home without it !!  (think of it like the spare tire for your car)    Plan C = Crisis - only use your albuterol nebulizer if you first try Plan B and it fails to help > ok to use the nebulizer up to every 4 hours but if start needing it regularly call for immediate appointment   Finish the prednisone and stop spiriva  F/u in 2 weeks with meds in hand (#35 on present symb 80 and one sample given) Add:  Consider performist/bud neb if can't wean off pred/saba neb and hfa makes her cough worse due to effects on upper airway             .

## 2018-04-20 NOTE — Progress Notes (Signed)
Subjective:    Patient ID: Sue Mitchell, female   DOB: [birth year]     MRN: [id number]   Brief patient profile:  62 yobf  RT quit smoking 2011 @ wt 140  with h/o sinus symptoms assoc with cough and need for saba prn as teenager while living in [place] but after arrival here at age late 81's it changed to point where bothered her mostly with onset of cold weather typically req ov rx  abx and prednisone help a lot and this pattern continued even after quit smoking to where the episodes last longer and onset of this not directed linked to cold weather in fact  had one April 2018 never  Completely cleared  then flared again in Oct 01 2016 severe dry cough / eval by allergist /ent since onset neg w/u so referred by Dr Sue Mitchell to pulmonary clinic 12/26/16     History of Present Illness  12/26/2016 1st [facility] office visit/ Sue Mitchell   Chief Complaint  Patient presents with  . Pulm Consult    Pt referred by Dr. Billy Mitchell ENT. Pt has productive cough-clear mucus sometimes yellow. Pt had horseness in voice for 6-8 weeks, no chills or fever, have some chest pain and tightness with cough.  recurrent cough since April 2018 never resolved p rx as uri then flaired early August 2018 rx zpak/pred/saba> some better  Cough was worse at hs now   Am feels wheezy not really coughing much up but what she does is white and < 1 tbsp Cough seems worse with exp to cold air at work  Assoc overt hb better on zantac  Some gen ant chest discomfort with severe coughing fits  rec Plan A = Automatic =  symbicort 80 Take 2 puffs first thing in am and then another 2 puffs about 12 hours later.  Plan B = Backup Only use your albuterol as a rescue medication  Start zantac 150- 300 mg after bfast and after supper until cough better  GERD diet    02/06/2017  f/u ov/Sue Mitchell re:  GOLD I copd/ cough on cold air exp  Chief Complaint  Patient presents with  . Follow-up    Cough had improved but then worsened again  with colder weather. She is using her albuterol inhaler 2 x per wk on average.   not limited by doe / steps ok except for knees  Some gerd at hs taking h2 am only  rec Pantoprazole (protonix) 40 mg  Take  30-60 min before first meal of the day and Zantac  300 mg bedtime until return to office - this is the best way to tell whether stomach acid is contributing to your problem.   GERD diet  Please schedule a follow up office visit in 6 weeks, call sooner if needed     10/21/2017  f/u ov/Sue Mitchell re: acute refractory cough / saw allergist cannot name "all neg"  ? Sue Mitchell?  Chief Complaint  Patient presents with  . Follow-up    Last seen by Sue Mitchell 02/06/17. Patient states she has had a productive cough, post nasal drip, and constantly using her voice. At night, she becomes so congested that she has to breathe through her mouth instead of nose.    from last ov until 09/25/17 fine on on ranitidine one bid/symb 80 2bid Never took protonix  Was still needing saba twice a week at baseline and a lot more while working at [employer]  Then abruptly  worse Aug 1  lots sinus drainage clear > zpak early in Aug by Sue Mitchell some better for a week or two then worse says ran out of symbicort one day prior to ov / using lots of saba now and severe nasal congestion as well as hoarseness and can't sleep due to cough rec Plan A = Automatic = symbiocort 80 Take 2 puffs first thing in am and then another 2 puffs about 12 hours later.  Plan B = Backup Only use your albuterol as a rescue medication  Prednisone 10 mg take  4 each am x 2 days,   2 each am x 2 days,  1 each am x 2 days and stop  Pantoprazole (protonix) 40 mg   Take  30-60 min before first meal of the day and ranitidine 300 mg each evening GERD diet      11/04/2017  f/u ov/Sue Mitchell re: atypical asthma in RT  Chief Complaint  Patient presents with  . Follow-up    Pt c/o sneezing, wheezing and cough with clear sputum- started after she mowed her lawn 1 day ago. She  has had to use her albuterol inhaler.   wakes up feeling good most am's s cough/ wheeze about 30 min later takes first 2 pffs of symb 80 around 5 15 am  Then next 2 pffs after 9 pm  Had been doing great prior mowing grass one day prior to OV Rarely need saba since last flare    Not limited by breathing from desired activities   rec Continue symbicort 80 Take 2 puffs first thing in am and then another 2 puffs about 12 hours later thru spacer Add singulair 10 mg each pm    12/04/2017  f/u ov/Sue Mitchell re:   symb 80 2bid/ singualir sometimes  Chief Complaint  Patient presents with  . Follow-up    PFT today, continues to have cough with hoarseness. She uses her albuterol inhaler 1 x per wk on average.   Dyspnea:  fine Cough: p grass exp / chemicals/ cologne dry Sleeping: flat/ one pillow no noct symptms SABA use: rare 02: none   Flare of cough / drainage 12/01/17 > has not tried otc's Plan A = Automatic = symbicort 80 Take 2 puffs first thing in am and then another 2 puffs about 12 hours later and remember to breath it out through your nose  singulair  10 mg every evening  Plan B = Backup Only use your albuterol as a rescue medication  For nasal symptoms > zyrtec 10 mg on at bedtime as needed > too sleepy    02/20/18 NP ov rec Augmentin/ pred/ antihistamine/singulair > no better and could not take singulair / did not try zyrtec in am "makes me sleepy"     03/11/2018 extended  f/u ov/Sue Mitchell re: refractory  Cough since aug 2018    Chief Complaint  Patient presents with  . Follow-up    Cough not improving. She is coughing up clear to white sputum. She states her cough is worse when she goes to work and when she first lies down at night. She is using her albuterol inhaler once daily on average.   Dyspnea:  Not limited by breathing from desired activities   Cough: worse around 5pm  Then immediately when supine x then some during the night, never took zyrtec at hs as rec with sense of globus and  throat / chest "congestion" but no excess mucus  Sleeping: on flat bed on side  SABA use: ?  if neb helps more than hfa - not really  Sure but "it sure makes me shake" Still using mint products  rec Symbicort 80  Take 2 puffs first thing in am and then another 2 puffs about 12 hours later.  Pantoprazole (protonix) 40 mg   Take  30-60 min before first meal of the day and Pepcid (famotidine)  20 mg one @  bedtime until return to office - this is the best way to tell whether stomach acid is contributing to your problem.   GERD diet  Try zyrtec 10 mg after supper  Take delsym two tsp every 12 hours and supplement if needed with  vicodin up to 1every 4 hours to suppress the urge to cough. Swallowing water and/or using ice chips/non mint and menthol containing candies (such as lifesavers or sugarless jolly ranchers) are also effective.  You should rest your voice and avoid activities that you know make you cough. Once you have eliminated the cough for 3 straight days try reducing the vicodin first,  then the delsym as tolerated.   On 03/14/18 Prednisone 10 mg take  4 each am x 2 days,   2 each am x 2 days,  1 each am x 2 days and stop  Please schedule a follow up office visit in 2 weeks, sooner if needed  with all medications /inhalers/ solutions in hand so we can verify exactly what you are taking. This includes all medications from all doctors and over the counters    03/26/2018  f/u ov/Sue Mitchell re: refractory cough/ wheeze on symb 80 2bid  Chief Complaint  Patient presents with  . Follow-up    Pt c/o sinus congestion, cough with yellow sputum and wheezing. She is using her albuterol inhaler once per wk on average.   Dyspnea:  Ok unless cough Cough:  at work and at home same severity/ frequency  Sleeping: not coughing while sleeping  SABA use:  Not really helping 02: none  gen ant chest soreness from coughing/ not able to use vicodin daytime to suppress cough rec  Only use your albuterol as a rescue  medication  Please see patient coordinator before you leave today  to schedule sinus CT Please remember to go to the  x-ray department  for your tests - we will call you with the results when they are available  Please schedule a follow up office visit in 2 weeks, sooner if needed  with all medications /inhalers/ solutions in hand so we can verify exactly what you are taking. This includes all medications from all doctors and over the counters       04/08/2018  f/u ov/Sue Mitchell re: refractory cough / brought just her inhalers to [facility]   Chief Complaint  Patient presents with  . Acute Visit    Pt has had complaints of sinius drainage and a cough x4 days and also states she has had problems sleeping. Pt also has been hurting everywhere, feeling weak, and has had sweats.  Dyspnea: usually only sob when coughing Cough: white mucus / min production "but feels like it's choking her"  Sleeping: on side bed pillows too heavy for bed blocks/ worse cough now at hs / not using cough suppression as advised  SABA use: rarely / symbicort count on 50 so missed about 14 doses or continued to use beyond the red line on the prior/ warned about both 02: none Reported after much back and forth "felt good only while on prednisone" and gradually worse  off it  - turns out "felt good" does not mean the cough was gone but breathing was better. rec Prednisone 10mg   Take 4 for three days 3 for three days 2 for three days 1 for three days and stop Plan A = Automatic = Symbicort 80 Take 2 puffs first thing in am and then another 2 puffs about 12 hours later and spiriva 2 puff each am  Plan B = Backup Only use your albuterol inhaler as a rescue medication   Try gabapentin 100 mg twice daily bfast and supper and if can't take it twice daily just take at bedtime  For drainage / throat tickle try take CHLORPHENIRAMINE  4 mg (chlortabs  [facility])   Stay on protonix Take 30- 60 min before your first and last meals of the day   Take delsym two tsp every 12 hours and supplement if needed with  vicodine  up to 2 every 4 hours to suppress the urge to cough. .  Please schedule a follow up office visit in 2 weeks, sooner if needed  with all medications /inhalers/ solutions in hand so we can verify exactly what you are taking. This includes all medications from all doctors and over the counters - needs spirometry on return     Admit date: 04/14/2018 Discharge date: 04/18/2018   Equipment/Devices: Nebulizer with albuterol sol'n, 2L oxygen ordered several days prior to discharge. Apparently patient left stating she would get the nebulizer and oxygen from [facility] and did not want to wait for it to be delivered to the room per care management. Per CM note, this will be delivered to the home.   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 At the time of discharge she reportedly maintained sufficient oxygenation during ambulation, but in the ED she was found to be hypoxic, wheezing, coughing, and weak, and was subsequently readmitted. With continued treatment including IV steroids she's subjectively significantly improved. She was weaned from oxygen while at rest and still desaturates on ambulation. She is discharged with DME in stable condition with follow up in 48 hours.  Discharge Diagnoses:  Principal Problem:   Acute respiratory failure with hypoxia (HCC) Active Problems:   Cough variant asthma  vs uacs wit vcd   Influenza   Cough  Acute hypoxic respiratory failure due to influenza A and asthma exacerbation:  - Completed5 days of tamiflu - Continue symbicort, spiriva respimat - Scheduled and prnalbuterol nebulizer (provided at DC) -Wheezing has improved so converted to po steroid with improvement, has prednisone  at home, ordered to take 40mg  po daily until follow up.  - Continue supplemental oxygen to maintain SpO2 >90%. Despite significant improvement, does still need O2 at discharge. Will anticipate continued improvement and liberation from oxygen, will need recheck at follow up office visit with pulmonology the coming week. - Continue antitussive. PMPAware queried, has gotten hydrocodone-APAP from Dr. Sherene SiresWert 2/12 and cough syrup intermittently from PCP.   Hemoptysis: CTA chest motion-degraded at last admission, no PE.  - Resolved.   Hypertension:  - Continue norvasc and HCTZ since recently stopping ARB for concern of exacerbating cough.   GERD:  - Continue PPI    04/20/2018  Extended f/u ov/Sue Mitchell re: post hosp f/u  Chief Complaint  Patient presents with  . Follow-up    Breathing has improved. She is not  using her albuterol inhaler but is using albuterol neb about 3 x per day.   Dyspnea:  MMRC2 = can't walk a nl pace on a flat grade s sob but does fine slow and flat  Cough: gone/ min urge to clear throat but now on tussionex Sleeping: side / pillows to prop up  SABA use: way over using as above  02: none at all    No obvious day to day or daytime variability or assoc excess/ purulent sputum or mucus plugs or hemoptysis or cp or chest tightness, subjective wheeze or overt sinus or hb symptoms.     Also denies any obvious fluctuation of symptoms with weather or environmental changes or other aggravating or alleviating factors except as outlined above   No unusual exposure hx or h/o childhood pna/ asthma or knowledge of premature birth.  Current Allergies, Complete Past Medical History, Past Surgical History, Family History, and Social History were reviewed in [employer] record.  ROS  The following are not active complaints unless bolded Hoarseness, sore throat, dysphagia, dental problems, itching, sneezing,  nasal congestion or discharge of excess mucus or purulent  secretions, ear ache,   fever, chills, sweats, unintended wt loss or wt gain, classically pleuritic or exertional cp,  orthopnea pnd or arm/hand swelling  or leg swelling, presyncope, palpitations, abdominal pain, anorexia, nausea, vomiting, diarrhea  or change in bowel habits or change in bladder habits, change in stools or change in urine, dysuria, hematuria,  rash, arthralgias, visual complaints, headache, numbness, weakness or ataxia or problems with walking or coordination,  change in mood or  memory.        Current Meds  Medication Sig  . acyclovir (ZOVIRAX) 200 MG capsule Take 200 mg by mouth 2 (two) times daily as needed (outbreaks).   Marland Kitchen albuterol (PROVENTIL HFA;VENTOLIN HFA) 108 (90 BASE) MCG/ACT inhaler Inhale 2 puffs into the lungs every 6 (six) hours as needed for wheezing or shortness of breath.  Marland Kitchen albuterol (PROVENTIL) (2.5 MG/3ML) 0.083% nebulizer solution Take 3 mLs (2.5 mg total) by nebulization every 6 (six) hours as needed for wheezing or shortness of breath.  Marland Kitchen amLODipine (NORVASC) 5 MG tablet Take 1 tablet (5 mg total) by mouth daily.  . budesonide-formoterol (SYMBICORT) 80-4.5 MCG/ACT inhaler Inhale 2 puffs into the lungs 2 (two) times daily for 1 day.  . chlorpheniramine-HYDROcodone (TUSSIONEX) 10-8 MG/5ML SUER Take 5 mLs by mouth every 12 (twelve) hours as needed for cough.  . gabapentin (NEURONTIN) 100 MG capsule Take 100 mg by mouth 2 (two) times daily.  . hydrochlorothiazide (MICROZIDE) 12.5 MG capsule Take 1 capsule (12.5 mg total) by mouth daily.  . pantoprazole (PROTONIX) 40 MG tablet Take 30- 60 min before your first and last meals of the day (Patient taking differently: Take 40 mg by mouth 2 (two) times daily before a meal. )  . predniSONE (DELTASONE) 10 MG tablet Prednisone dosing: Take  Prednisone  (4 tabs) x 3 days, then taper to  (3 tabs) x 3 days, then  (2 tabs) x 3days, then  (1 tab) x 3days, then OFF.  Dispense:  30 tabs, refills: None  .  Tiotropium Bromide Monohydrate (SPIRIVA RESPIMAT) 2.5 MCG/ACT AERS Inhale 2 puffs into the lungs daily.                   Objective:   Physical Exam    amb hoarse bf nad    04/20/2018       169  04/08/2018       164  03/26/2018       173  03/11/2018       173  12/04/2017     170  11/04/2017       169  10/21/2017       169  02/06/2017     171   12/26/16 171 lb 6.4 oz (77.7 kg)  11/18/13 170 lb (77.1 kg)  03/21/11 171 lb (77.6 kg)    Vital signs reviewed - Note on arrival 02 sats  96% on RA    HEENT: nl dentition, turbinates bilaterally, and oropharynx. Nl external ear canals without cough reflex   NECK :  without JVD/Nodes/TM/ nl carotid upstrokes bilaterally   LUNGS: no acc muscle use,  Nl contour chest with pseudowheeze only  without cough on insp or exp maneuvers   CV:  RRR  no s3 or murmur or increase in P2, and no edema   ABD:  soft and nontender with nl inspiratory excursion in the supine position. No bruits or organomegaly appreciated, bowel sounds nl  MS:  Nl gait/ ext warm without deformities, calf tenderness, cyanosis or clubbing No obvious joint restrictions   SKIN: warm and dry without lesions    NEURO:  alert, approp, nl sensorium with  no motor or cerebellar deficits apparent.           I personally reviewed images and agree with radiology impression as follows:   Chest CTa 04/11/18  Limited study due to respiratory motion. No obvious pulmonary embolism or other acute findings.    Assessment:

## 2018-04-26 ENCOUNTER — Encounter: Payer: Self-pay | Admitting: Internal Medicine

## 2018-04-26 NOTE — Assessment & Plan Note (Addendum)
Recurrent cough since teenager really bad and daily since Aug 2018 12/26/2016  rec symbicort 80 2bid then return for full pfts - PFT's  02/06/2017  FEV1 1.37 (80 % ) ratio 67  p 5 % improvement from saba p symb 80  prior to study with DLCO  66/66c % corrects to 79  % for alv volume   - Spirometry 10/21/2017  FEV1 1.18 (70%)  Ratio 71 with min curvature during attack of cough/ mostly pseudowheeze on exam on symb 80 x2  - FENO 10/21/2017  =   7  On symb 80 x 2  - 11/04/2017     90% with spacer  - singulair added 11/04/2017  But not taking consistently as of 12/04/2017 > rec restart daily  X one week and stopped it due "dizzy"  - PFT's  12/04/2017  FEV1 1.34 (85 % ) ratio 67  p 20 % improvement from saba p nothing prior to study with DLCO  71 % corrects to 80  % for alv volume   - Allergy profile 12/04/2017 >  Eos 0.0 /  IgE  546 dust only  - cyclical cough rx 03/11/2018  - Spirometry 03/26/2018  FEV1 1.1 (73%)  Ratio 0.64 - 03/26/2018  After extensive coaching inhaler device,  effectiveness =    90% s spacer  - Gabapentin 100 mg tid trial 03/26/2018 poorly tol  - Sinus CT 04/07/2018 > Negative study.  No evidence of paranasal sinusitis. - Spirometry 04/08/2018  FEV1 0.7 (47%)  Ratio 0.52 with convex curvature p am symb 80 x 2 and neb in office   - 04/08/2018  After extensive coaching inhaler device,  effectiveness =    75% with smi > added trial of spiriva 2pffs daily x 2 weeks then return   - CTa 04/11/18 for hemoptysis with refractory cough > Limited study due to respiratory motion. No obvious pulmonary embolism or other acute findings. - 04/20/2018  After extensive coaching inhaler device,  effectiveness =    0% with spiriva smi which caused immediate severe cough so rec d/c spiriva  - 04/20/2018 rechallenge with gabapentin 100 mg bid   Her acute worsening was clearly related to influenza A  and not her asthma /vcd which remain extremely problematic esp re return to work and risk for further  exacerbations  rec  Taper off prednisone Continue symb 80 vis spacer Max rx for gerd/ cyclical cough but no more tussionex refills  F/u in 2 weeks with all meds in hand using a trust but verify approach to confirm accurate Medication  Reconciliation The principal here is that until we are certain that the  patients are doing what we've asked, it makes no sense to ask them to do more.     I had an extended discussion with the patient reviewing all relevant studies completed to date and  lasting 15 to 20 minutes of a 25 minute visit    See device teaching which extended face to face time for this visit.  Each maintenance medication was reviewed in detail including emphasizing most importantly the difference between maintenance and prns and under what circumstances the prns are to be triggered using an action plan format that is not reflected in the computer generated alphabetically organized AVS which I have not found useful in most complex patients, especially with respiratory illnesses  Please see AVS for specific instructions unique to this visit that I personally wrote and verbalized to the the pt in detail and then  reviewed with pt  by my nurse highlighting any  changes in therapy recommended at today's visit to their plan of care.

## 2018-05-04 ENCOUNTER — Ambulatory Visit: Payer: BLUE CROSS/BLUE SHIELD | Admitting: Internal Medicine

## 2018-05-04 ENCOUNTER — Encounter: Payer: Self-pay | Admitting: Internal Medicine

## 2018-05-04 ENCOUNTER — Encounter: Payer: Self-pay | Admitting: *Deleted

## 2018-05-04 VITALS — BP 126/82 | HR 81 | Temp 98.1°F | Ht 58.5 in | Wt 170.0 lb

## 2018-05-04 DIAGNOSIS — J45991 Cough variant asthma: Secondary | ICD-10-CM

## 2018-05-04 NOTE — Progress Notes (Signed)
Subjective:    Patient ID: Sue Mitchell, female   DOB: [birth year]     MRN: [id number]   Brief patient profile:  62 yobf  RT quit smoking 2011 @ wt 140  with h/o sinus symptoms assoc with cough and need for saba prn as teenager while living in [place] but after arrival here at age late 81's it changed to point where bothered her mostly with onset of cold weather typically req ov rx  abx and prednisone help a lot and this pattern continued even after quit smoking to where the episodes last longer and onset of this not directed linked to cold weather in fact  had one April 2018 never  Completely cleared  then flared again in Oct 01 2016 severe dry cough / eval by allergist /ent since onset neg w/u so referred by Sue Mitchell to pulmonary clinic 12/26/16     History of Present Illness  12/26/2016 1st Sue Mitchell   Chief Complaint  Patient presents with  . Pulm Consult    Pt referred by Sue. Billy Mitchell ENT. Pt has productive cough-clear mucus sometimes yellow. Pt had horseness in voice for 6-8 weeks, no chills or fever, have some chest pain and tightness with cough.  recurrent cough since April 2018 never resolved p rx as uri then flaired early August 2018 rx zpak/pred/saba> some better  Cough was worse at hs now   Am feels wheezy not really coughing much up but what she does is white and < 1 tbsp Cough seems worse with exp to cold air at work  Assoc overt hb better on zantac  Some gen ant chest discomfort with severe coughing fits  rec Plan A = Automatic =  symbicort 80 Take 2 puffs first thing in am and then another 2 puffs about 12 hours later.  Plan B = Backup Only use your albuterol as a rescue medication  Start zantac 150- 300 mg after bfast and after supper until cough better  GERD diet    02/06/2017  f/u ov/Sue Mitchell re:  GOLD I copd/ cough on cold air exp  Chief Complaint  Patient presents with  . Follow-up    Cough had improved but then worsened again  with colder weather. She is using her albuterol inhaler 2 x per wk on average.   not limited by doe / steps ok except for knees  Some gerd at hs taking h2 am only  rec Pantoprazole (protonix) 40 mg  Take  30-60 min before first meal of the day and Zantac  300 mg bedtime until return to office - this is the best way to tell whether stomach acid is contributing to your problem.   GERD diet  Please schedule a follow up office visit in 6 weeks, call sooner if needed     10/21/2017  f/u ov/Sue Mitchell re: acute refractory cough / saw allergist cannot name "all neg"  ? Sue Mitchell?  Chief Complaint  Patient presents with  . Follow-up    Last seen by Sue Mitchell 02/06/17. Patient states she has had a productive cough, post nasal drip, and constantly using her voice. At night, she becomes so congested that she has to breathe through her mouth instead of nose.    from last ov until 09/25/17 fine on on ranitidine one bid/symb 80 2bid Never took protonix  Was still needing saba twice a week at baseline and a lot more while working at [employer]  Then abruptly  worse Aug 1  lots sinus drainage clear > zpak early in Aug by Sue Mitchell some better for a week or two then worse says ran out of symbicort one day prior to ov / using lots of saba now and severe nasal congestion as well as hoarseness and can't sleep due to cough rec Plan A = Automatic = symbiocort 80 Take 2 puffs first thing in am and then another 2 puffs about 12 hours later.  Plan B = Backup Only use your albuterol as a rescue medication  Prednisone 10 mg take  4 each am x 2 days,   2 each am x 2 days,  1 each am x 2 days and stop  Pantoprazole (protonix) 40 mg   Take  30-60 min before first meal of the day and ranitidine 300 mg each evening GERD diet      11/04/2017  f/u ov/Sue Mitchell re: atypical asthma in RT  Chief Complaint  Patient presents with  . Follow-up    Pt c/o sneezing, wheezing and cough with clear sputum- started after she mowed her lawn 1 day ago. She  has had to use her albuterol inhaler.   wakes up feeling good most am's s cough/ wheeze about 30 min later takes first 2 pffs of symb 80 around 5 15 am  Then next 2 pffs after 9 pm  Had been doing great prior mowing grass one day prior to OV Rarely need saba since last flare    Not limited by breathing from desired activities   rec Continue symbicort 80 Take 2 puffs first thing in am and then another 2 puffs about 12 hours later thru spacer Add singulair 10 mg each pm    12/04/2017  f/u ov/Sue Mitchell re:   symb 80 2bid/ singualir sometimes  Chief Complaint  Patient presents with  . Follow-up    PFT today, continues to have cough with hoarseness. She uses her albuterol inhaler 1 x per wk on average.   Dyspnea:  fine Cough: p grass exp / chemicals/ cologne dry Sleeping: flat/ one pillow no noct symptms SABA use: rare 02: none   Flare of cough / drainage 12/01/17 > has not tried otc's Plan A = Automatic = symbicort 80 Take 2 puffs first thing in am and then another 2 puffs about 12 hours later and remember to breath it out through your nose  singulair  10 mg every evening  Plan B = Backup Only use your albuterol as a rescue medication  For nasal symptoms > zyrtec 10 mg on at bedtime as needed > too sleepy    02/20/18 NP ov rec Augmentin/ pred/ antihistamine/singulair > no better and could not take singulair / did not try zyrtec in am "makes me sleepy"     03/11/2018 extended  f/u ov/Sue Mitchell re: refractory  Cough since aug 2018    Chief Complaint  Patient presents with  . Follow-up    Cough not improving. She is coughing up clear to white sputum. She states her cough is worse when she goes to work and when she first lies down at night. She is using her albuterol inhaler once daily on average.   Dyspnea:  Not limited by breathing from desired activities   Cough: worse around 5pm  Then immediately when supine x then some during the night, never took zyrtec at hs as rec with sense of globus and  throat / chest "congestion" but no excess mucus  Sleeping: on flat bed on side  SABA use: ?  if neb helps more than hfa - not really  Sure but "it sure makes me shake" Still using mint products  rec Symbicort 80  Take 2 puffs first thing in am and then another 2 puffs about 12 hours later.  Pantoprazole (protonix) 40 mg   Take  30-60 min before first meal of the day and Pepcid (famotidine)  20 mg one @  bedtime until return to office - this is the best way to tell whether stomach acid is contributing to your problem.   GERD diet  Try zyrtec 10 mg after supper  Take delsym two tsp every 12 hours and supplement if needed with  vicodin up to 1every 4 hours to suppress the urge to cough. Swallowing water and/or using ice chips/non mint and menthol containing candies (such as lifesavers or sugarless jolly ranchers) are also effective.  You should rest your voice and avoid activities that you know make you cough. Once you have eliminated the cough for 3 straight days try reducing the vicodin first,  then the delsym as tolerated.   On 03/14/18 Prednisone 10 mg take  4 each am x 2 days,   2 each am x 2 days,  1 each am x 2 days and stop  Please schedule a follow up office visit in 2 weeks, sooner if needed  with all medications /inhalers/ solutions in hand so we can verify exactly what you are taking. This includes all medications from all doctors and over the counters    03/26/2018  f/u ov/Bentli Llorente re: refractory cough/ wheeze on symb 80 2bid  Chief Complaint  Patient presents with  . Follow-up    Pt c/o sinus congestion, cough with yellow sputum and wheezing. She is using her albuterol inhaler once per wk on average.   Dyspnea:  Ok unless cough Cough:  at work and at home same severity/ frequency  Sleeping: not coughing while sleeping  SABA use:  Not really helping 02: none  gen ant chest soreness from coughing/ not able to use vicodin daytime to suppress cough rec  Only use your albuterol as a rescue  medication  Please see patient coordinator before you leave today  to schedule sinus CT Please remember to go to the  x-ray department  for your tests - we will call you with the results when they are available  Please schedule a follow up office visit in 2 weeks, sooner if needed  with all medications /inhalers/ solutions in hand so we can verify exactly what you are taking. This includes all medications from all doctors and over the counters       04/08/2018  f/u ov/Mattisen Pohlmann re: refractory cough / brought just her inhalers to [facility]   Chief Complaint  Patient presents with  . Acute Visit    Pt has had complaints of sinius drainage and a cough x4 days and also states she has had problems sleeping. Pt also has been hurting everywhere, feeling weak, and has had sweats.  Dyspnea: usually only sob when coughing Cough: white mucus / min production "but feels like it's choking her"  Sleeping: on side bed pillows too heavy for bed blocks/ worse cough now at hs / not using cough suppression as advised  SABA use: rarely / symbicort count on 50 so missed about 14 doses or continued to use beyond the red line on the prior/ warned about both 02: none Reported after much back and forth "felt good only while on prednisone" and gradually worse  off it  - turns out "felt good" does not mean the cough was gone but breathing was better. rec Prednisone 10mg   Take 4 for three days 3 for three days 2 for three days 1 for three days and stop Plan A = Automatic = Symbicort 80 Take 2 puffs first thing in am and then another 2 puffs about 12 hours later and spiriva 2 puff each am  Plan B = Backup Only use your albuterol inhaler as a rescue medication   Try gabapentin 100 mg twice daily bfast and supper and if can't take it twice daily just take at bedtime  For drainage / throat tickle try take CHLORPHENIRAMINE  4 mg (chlortabs  [facility])   Stay on protonix Take 30- 60 min before your first and last meals of the day   Take delsym two tsp every 12 hours and supplement if needed with  vicodine  up to 2 every 4 hours to suppress the urge to cough. .  Please schedule a follow up office visit in 2 weeks, sooner if needed  with all medications /inhalers/ solutions in hand so we can verify exactly what you are taking. This includes all medications from all doctors and over the counters - needs spirometry on return     Admit date: 04/14/2018 Discharge date: 04/18/2018   Equipment/Devices: Nebulizer with albuterol sol'n, 2L oxygen ordered several days prior to discharge. Apparently patient left stating she would get the nebulizer and oxygen from [facility] and did not want to wait for it to be delivered to the room per care management. Per CM note, this will be delivered to the home.   Brief/Interim Summary: Alfonzo FellerJohnetta Sandersis a 63 y.o.femalerespiratory therapist with a history of allergic asthma who presented to the ED 2/18, hours after discharge after admission for hypoxia due to influenza, with worsened dyspnea. At the time of discharge she reportedly maintained sufficient oxygenation during ambulation, but in the ED she was found to be hypoxic, wheezing, coughing, and weak, and was subsequently readmitted. With continued treatment including IV steroids she's subjectively significantly improved. She was weaned from oxygen while at rest and still desaturates on ambulation. She is discharged with DME in stable condition with follow up in 48 hours.  Discharge Diagnoses:  Principal Problem:   Acute respiratory failure with hypoxia (HCC) Active Problems:   Cough variant asthma  vs uacs wit vcd   Influenza   Cough  Acute hypoxic respiratory failure due to influenza A and asthma exacerbation:  - Completed5 days of tamiflu - Continue symbicort, spiriva respimat - Scheduled and prnalbuterol nebulizer (provided at DC) -Wheezing has improved so converted to po steroid with improvement, has prednisone  at home, ordered to take 40mg  po daily until follow up.  - Continue supplemental oxygen to maintain SpO2 >90%. Despite significant improvement, does still need O2 at discharge. Will anticipate continued improvement and liberation from oxygen, will need recheck at follow up office visit with pulmonology the coming week. - Continue antitussive. PMPAware queried, has gotten hydrocodone-APAP from Sue. Sherene SiresWert 2/12 and cough syrup intermittently from PCP.   Hemoptysis: CTA chest motion-degraded at last admission, no PE.  - Resolved.   Hypertension:  - Continue norvasc and HCTZ since recently stopping ARB for concern of exacerbating cough.   GERD:  - Continue PPI    04/20/2018  Extended f/u ov/Erlinda Solinger re: post hosp f/u  Chief Complaint  Patient presents with  . Follow-up    Breathing has improved. She is not  using her albuterol inhaler but is using albuterol neb about 3 x per day.   Dyspnea:  MMRC2 = can't walk a nl pace on a flat grade s sob but does fine slow and flat  Cough: gone/ min urge to clear throat but now on tussionex Sleeping: side / pillows to prop up  SABA use: way over using as above  02: none at all  rec Continue pantoprazole 40 mg Take 30- 60 min before your first and last meals of the day  Gabapentin  Restart 100 mg twice daily bfast and bedtime Plan A = Automatic = Symbicort 80 Take 2 puffs first thing in am and then another 2 puffs about 12 hours later.  Work on inhaler technique:   Plan B = Backup Only use your albuterol inhaler as a rescue medication  Plan C = Crisis - only use your albuterol nebulizer if you first try Plan B and it fails to help > ok to use the nebulizer up to every 4 hours but if start needing it regularly call for immediate appointment Finish the prednisone and stop spiriva F/u in 2 weeks with meds in hand (#35 on present symb 80 and one sample given) Add:  Consider performist/bud neb if can't wean off pred/saba neb and hfa makes her cough worse due  to effects on upper airway    05/04/2018  f/u ov/Mahi Zabriskie re:   symbicort 80 one full at home and total of 45 puffs samples/ 32 gabapenitn  Chief Complaint  Patient presents with  . Acute Visit    Cough has improved but has not resolved. She is coughing up min, clear sputum. She has not had to use her albuterol inhaler.   Dyspnea:  MMRC1 = can walk nl pace, flat grade, can't hurry or go uphills or steps s sob   Cough: p exertion Sleeping: fine 30 degrees on pillows  SABA use: no saba  02: no   No obvious day to day or daytime variability or assoc excess/ purulent sputum or mucus plugs or hemoptysis or cp or chest tightness, subjective wheeze or overt sinus or hb symptoms.   Sleeping as above  without nocturnal  or early am exacerbation  of respiratory  c/o's or need for noct saba. Also denies any obvious fluctuation of symptoms with weather or environmental changes or other aggravating or alleviating factors except as outlined above   No unusual exposure hx or h/o childhood pna/ asthma or knowledge of premature birth.  Current Allergies, Complete Past Medical History, Past Surgical History, Family History, and Social History were reviewed in [employer] record.  ROS  The following are not active complaints unless bolded Hoarseness, sore throat, dysphagia, dental problems, itching, sneezing,  nasal congestion or discharge of excess mucus or purulent secretions, ear ache,   fever, chills, sweats, unintended wt loss or wt gain, classically pleuritic or exertional cp,  orthopnea pnd or arm/hand swelling  or leg swelling, presyncope, palpitations, abdominal pain, anorexia, nausea, vomiting, diarrhea  or change in bowel habits or change in bladder habits, change in stools or change in urine, dysuria, hematuria,  rash, arthralgias, visual complaints, headache, numbness, weakness or ataxia or problems with walking or coordination,  change in mood or  memory.        Current Meds   Medication Sig  . acyclovir (ZOVIRAX) 200 MG capsule Take 200 mg by mouth 2 (two) times daily as needed (outbreaks).   Marland Kitchen albuterol (PROVENTIL HFA;VENTOLIN HFA) 108 (90  BASE) MCG/ACT inhaler Inhale 2 puffs into the lungs every 6 (six) hours as needed for wheezing or shortness of breath.  Marland Kitchen albuterol (PROVENTIL) (2.5 MG/3ML) 0.083% nebulizer solution Take 3 mLs (2.5 mg total) by nebulization every 6 (six) hours as needed for wheezing or shortness of breath.  Marland Kitchen amLODipine (NORVASC) 5 MG tablet Take 1 tablet (5 mg total) by mouth daily.  .     . gabapentin (NEURONTIN) 100 MG capsule Take 100 mg by mouth 2 (two) times daily.  . hydrochlorothiazide (MICROZIDE) 12.5 MG capsule Take 1 capsule (12.5 mg total) by mouth daily.  Marland Kitchen HYDROcodone-acetaminophen (NORCO/VICODIN) 5-325 MG tablet Take 1 tablet by mouth every 6 (six) hours as needed for moderate pain.  . pantoprazole (PROTONIX) 40 MG tablet Take 30- 60 min before your first and last meals of the day (Patient taking differently: Take 40 mg by mouth 2 (two) times daily before a meal. )                   Objective:   Physical Exam    amb mod hoarse bf nad  Still some throat clearing   05/04/2018         170  04/20/2018       169  04/08/2018       164  03/26/2018       173  03/11/2018       173  12/04/2017     170  11/04/2017       169  10/21/2017       169  02/06/2017     171   12/26/16 171 lb 6.4 oz (77.7 kg)  11/18/13 170 lb (77.1 kg)  03/21/11 171 lb (77.6 kg)   Vital signs reviewed - Note on arrival 02 sats  96% on RA        HEENT: nl dentition, turbinates bilaterally, and oropharynx. Nl external ear canals without cough reflex   NECK :  without JVD/Nodes/TM/ nl carotid upstrokes bilaterally   LUNGS: no acc muscle use,  Nl contour chest which is clear to A and P bilaterally without cough on insp or exp maneuvers - minimal pseudowheeze    CV:  RRR  no s3 or murmur or increase in P2, and no edema   ABD:  soft and nontender  with nl inspiratory excursion in the supine position. No bruits or organomegaly appreciated, bowel sounds nl  MS:  Nl gait/ ext warm without deformities, calf tenderness, cyanosis or clubbing No obvious joint restrictions   SKIN: warm and dry without lesions    NEURO:  alert, approp, nl sensorium with  no motor or cerebellar deficits apparent.           Assessment:

## 2018-05-04 NOTE — Patient Instructions (Addendum)
Increase gabapentin to 100 mg three times a day   Ok to return to work 05/16/2018    Please schedule a follow up office visit in 3 weeks, sooner if needed  with all medications /inhalers/ solutions in hand so we can verify exactly what you are taking. This includes all medications from all doctors and over the counters

## 2018-05-05 ENCOUNTER — Encounter: Payer: Self-pay | Admitting: Internal Medicine

## 2018-05-05 NOTE — Assessment & Plan Note (Addendum)
Recurrent cough since teenager really bad and daily since Aug 2018 12/26/2016  rec symbicort 80 2bid then return for full pfts - PFT's  02/06/2017  FEV1 1.37 (80 % ) ratio 67  p 5 % improvement from saba p symb 80  prior to study with DLCO  66/66c % corrects to 79  % for alv volume   - Spirometry 10/21/2017  FEV1 1.18 (70%)  Ratio 71 with min curvature during attack of cough/ mostly pseudowheeze on exam on symb 80 x2  - FENO 10/21/2017  =   7  On symb 80 x 2  - 11/04/2017     90% with spacer  - singulair added 11/04/2017  But not taking consistently as of 12/04/2017 > rec restart daily  X one week and stopped it due "dizzy"  - PFT's  12/04/2017  FEV1 1.34 (85 % ) ratio 67  p 20 % improvement from saba p nothing prior to study with DLCO  71 % corrects to 80  % for alv volume   - Allergy profile 12/04/2017 >  Eos 0.0 /  IgE  546 dust only  - cyclical cough rx 03/11/2018  - Spirometry 03/26/2018  FEV1 1.1 (73%)  Ratio 0.64 - 03/26/2018  After extensive coaching inhaler device,  effectiveness =    90% s spacer  - Gabapentin 100 mg tid trial 03/26/2018> attempted rx by non-adherent  - Sinus CT 04/07/2018 > Negative study.  No evidence of paranasal sinusitis. - Spirometry 04/08/2018  FEV1 0.7 (47%)  Ratio 0.52 with convex curvature p am symb 80 x 2 and neb in office   - 04/08/2018  After extensive coaching inhaler device,  effectiveness =    75% with smi > added trial of spiriva 2pffs daily x 2 weeks then return  - CTa 04/11/18 for hemoptysis with refractory cough > Limited study due to respiratory motion. No obvious pulmonary embolism or other acute findings. - 04/20/2018  After extensive coaching inhaler device,  effectiveness =    0% with spiriva smi which caused immediate severe cough so rec d/c spiriva  - 04/20/2018 rechallenge with gabapentin 100 mg bid  - 05/04/2018  After extensive coaching inhaler device,  effectiveness =    90% with spacer - s spacer starts coughing immediately  - Spirometry 05/04/2018  FEV1  1.2 (75%)  Ratio 0.65 with mild curvature p symb 80 x 2 puffs   Higher doses of ICS risk aggravating uacs which happens with any inhaler she uses s spacer device as she demonstrated today so rec  1) keep symb dose the same  2) use spacer with both symb and saba prn 3) increase gabapentin to tid 4) RTW  05/16/2018 5) return in 3 weeks with all meds in hand using a trust but verify approach to confirm accurate Medication  Reconciliation and inhaler/pill counts.The principal here is that until we are certain that the  patients are doing what we've asked, it makes no sense to ask them to do more.    I had an extended discussion with the patient reviewing all relevant studies completed to date and  lasting 15 to 20 minutes of a 25 minute visit    See device teaching which extended face to face time for this visit.  Each maintenance medication was reviewed in detail including emphasizing most importantly the difference between maintenance and prns and under what circumstances the prns are to be triggered using an action plan format that is not reflected in the  computer generated alphabetically organized AVS which I have not found useful in most complex patients, especially with respiratory illnesses  Please see AVS for specific instructions unique to this visit that I personally wrote and verbalized to the the pt in detail and then reviewed with pt  by my nurse highlighting any  changes in therapy recommended at today's visit to their plan of care.

## 2018-05-06 ENCOUNTER — Telehealth: Payer: Self-pay | Admitting: Internal Medicine

## 2018-05-06 NOTE — Telephone Encounter (Signed)
Called patient unable to reach and unable to leave voicemail 

## 2018-05-07 NOTE — Telephone Encounter (Signed)
Called and spoke with pt. Pt stated that we should be receiving a fax in regards to disability paperwork that will need to be filled out. Stated to pt that the paperwork would first have to be sent to [employer] and then once we received the paperwork back, we would give them to Dr. [name] for him to fill out/sign. Pt expressed understanding.  Pt also mentioned wanting to know if it was too soon for her to get a pna shot or if she could get one early with her being 63 yrs old due to her pulm issues. Dr. [name], please advise. Thanks!

## 2018-05-07 NOTE — Telephone Encounter (Signed)
ATC pt, received busy signal x2.  

## 2018-05-07 NOTE — Telephone Encounter (Signed)
That's fine :  I need the last day she worked and the first day she goes back to complete it.  She should have the pneumovax now and repeat in 5 years unless she's had one of the pneumonia shots somewhere outside the [facility] system as we have no record of prev shots

## 2018-05-08 NOTE — Telephone Encounter (Signed)
Spoke with Ernestine from Dr. Tedra Sen[place] office (patient's PCP), they do not have any PNA vaccines on file for her.   Left message for patient to call back.

## 2018-05-11 NOTE — Telephone Encounter (Signed)
Called patient, unable to reach and unable to leave VM. 

## 2018-05-12 NOTE — Telephone Encounter (Signed)
Called and spoke with Patient.  Patient stated that the last day she worked was 04/10/18, and she goes back to work 05/16/18.   Patient stated that she has never received pna vaccine.  Dr [name] recommendations given.  Patient stated that her next OV was 05/25/18, with Dr [name], and she wanted pna vaccine at that OV.   Message routed to Dr [name]

## 2018-05-13 NOTE — Telephone Encounter (Signed)
That's reasonable -we can take care of all this when she returns

## 2018-05-13 NOTE — Telephone Encounter (Signed)
Spoke with patient. She verbalized understanding. Nothing further needed at time of call.  

## 2018-05-14 ENCOUNTER — Telehealth: Payer: Self-pay | Admitting: Internal Medicine

## 2018-05-14 NOTE — Telephone Encounter (Signed)
Called and spoke with patient, she stated that she is worried about working during this virus that is going around. 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 She is worried about being around other patients right now due to her under lying issues.   MW please advise, thank you.

## 2018-05-15 NOTE — Telephone Encounter (Signed)
Called and spoke with pt stating to her the info from [facility]. Pt expressed understanding and stated she would like a letter to be written by MW to write her out of work for the next three weeks.  Dr. [name], please advise on this and please advise what all needs to be stated in the letter. Thanks!

## 2018-05-15 NOTE — Telephone Encounter (Signed)
"  This pt has difficult to control asthma and would be at high risk of flare with any exposure to viral infections including covid-19.  Please take this into consideration when deciding on work assignments for the immediate and foreseeable future."   NB: this is not the same as "taking her oow  x 3 weeks" which we could do if having active symptoms.  If she does have active symptoms have her state them and then write for 3 weeks out of work due to h/o poorly controlled asthma with active complaints of blank (fill in the blank with her symptoms)  and be sure she returns before the 3 weeks expires for recheck.

## 2018-05-15 NOTE — Telephone Encounter (Signed)
Her problem is her airways not the lung itself which is where the virus is most damaging  - I do agree though if she gets the infection it would be a bad thing and  she should take this up with HR at [facility] and I'd be happy to write a letter but I can't take her out of work for the duration of the infection which be around for months if not a year.

## 2018-05-15 NOTE — Telephone Encounter (Signed)
Called and spoke with patient regarding MW recommendations and advise below Pt reports that she has some hoarseness, sore throat, SOB with exertion, some wheezing, and productive cough-white mucus at this time in last several days. Pt has no fever, no chills, no body aches, no chest tightness or pain Advised patient wrote letter as directed below for next 3 weeks out of work per [employer] Wrote letter for patient; pt advised to mail it to her home address Verified pt's home address, and confirmed the information Pt expressed and verbalized understanding had no further concerns nor questions Letter has been written today and placed in mail Nothing further needed

## 2018-05-21 ENCOUNTER — Telehealth: Payer: Self-pay | Admitting: Internal Medicine

## 2018-05-21 NOTE — Telephone Encounter (Signed)
Pt is calling back [phone]

## 2018-05-21 NOTE — Telephone Encounter (Signed)
LMTCB

## 2018-05-21 NOTE — Telephone Encounter (Signed)
Called and spoke with patient, she checked her mailbox and received the letter. Nothing further needed.

## 2018-05-25 ENCOUNTER — Encounter: Payer: Self-pay | Admitting: Internal Medicine

## 2018-05-25 ENCOUNTER — Ambulatory Visit (INDEPENDENT_AMBULATORY_CARE_PROVIDER_SITE_OTHER): Payer: BLUE CROSS/BLUE SHIELD | Admitting: Internal Medicine

## 2018-05-25 ENCOUNTER — Encounter: Payer: Self-pay | Admitting: General Surgery

## 2018-05-25 ENCOUNTER — Other Ambulatory Visit: Payer: Self-pay

## 2018-05-25 DIAGNOSIS — R05 Cough: Secondary | ICD-10-CM | POA: Diagnosis not present

## 2018-05-25 DIAGNOSIS — R0602 Shortness of breath: Secondary | ICD-10-CM

## 2018-05-25 DIAGNOSIS — J45991 Cough variant asthma: Secondary | ICD-10-CM

## 2018-05-25 DIAGNOSIS — R490 Dysphonia: Secondary | ICD-10-CM

## 2018-05-25 MED ORDER — GABAPENTIN 100 MG PO CAPS: 100.0000 mg | ORAL_CAPSULE | Freq: Four times a day (QID) | ORAL | 11 refills | Status: DC

## 2018-05-25 MED ORDER — BUDESONIDE-FORMOTEROL FUMARATE 80-4.5 MCG/ACT IN AERO: INHALATION_SPRAY | RESPIRATORY_TRACT | 11 refills | Status: DC

## 2018-05-25 NOTE — Assessment & Plan Note (Signed)
Recurrent cough since teenager really bad and daily since Aug 2018 12/26/2016  rec symbicort 80 2bid then return for full pfts - PFT's  02/06/2017  FEV1 1.37 (80 % ) ratio 67  p 5 % improvement from saba p symb 80  prior to study with DLCO  66/66c % corrects to 79  % for alv volume   - Spirometry 10/21/2017  FEV1 1.18 (70%)  Ratio 71 with min curvature during attack of cough/ mostly pseudowheeze on exam on symb 80 x2  - FENO 10/21/2017  =   7  On symb 80 x 2  - 11/04/2017     90% with spacer  - singulair added 11/04/2017  But not taking consistently as of 12/04/2017 > rec restart daily  X one week and stopped it due "dizzy"  - PFT's  12/04/2017  FEV1 1.34 (85 % ) ratio 67  p 20 % improvement from saba p nothing prior to study with DLCO  71 % corrects to 80  % for alv volume   - Allergy profile 12/04/2017 >  Eos 0.0 /  IgE  546 dust only  - cyclical cough rx 03/11/2018  - Spirometry 03/26/2018  FEV1 1.1 (73%)  Ratio 0.64 - 03/26/2018  After extensive coaching inhaler device,  effectiveness =    90% s spacer  - Gabapentin 100 mg tid trial 03/26/2018> attempted rx by non-adherent  - Sinus CT 04/07/2018 > Negative study.  No evidence of paranasal sinusitis. - Spirometry 04/08/2018  FEV1 0.7 (47%)  Ratio 0.52 with convex curvature p am symb 80 x 2 and neb in office   - 04/08/2018  After extensive coaching inhaler device,  effectiveness =    75% with smi > added trial of spiriva 2pffs daily x 2 weeks then return  - CTa 04/11/18 for hemoptysis with refractory cough > Limited study due to respiratory motion. No obvious pulmonary embolism or other acute findings. - 04/20/2018  After extensive coaching inhaler device,  effectiveness =    0% with spiriva smi which caused immediate severe cough so rec d/c spiriva  - 04/20/2018 rechallenge with gabapentin 100 mg bid  - 05/04/2018  After extensive coaching inhaler device,  effectiveness =    90% with spacer - s spacer starts coughing immediately  - Spirometry 05/04/2018  FEV1  1.2 (75%)  Ratio 0.65 with mild curvature p symb 80 x 2 puffs   - 05/04/2018 increased gabapentin to 100 tid   - 05/25/2018 increased to 100 mg qid     >>>> Appears to be on the right track but still daytime cough so rec increase gabapentin to 100 mg qid andno more narcotic cough meds/ continue symb 80 2bid with coupon and stop all pnds with 1st gen H1 blockers per guidelines

## 2018-05-25 NOTE — Patient Instructions (Addendum)
For drainage / throat tickle try take CHLORPHENIRAMINE  4 mg (chlortab 4 mg) - take one every 4 hours as needed - available over the counter- may cause drowsiness so start with just a bedtime dose or two and see how you tolerate it before trying in daytime     Increase gabapentin to 100 mg four times daily   Continue Symbicort 80 Take 2 puffs first thing in am and then another 2 puffs about 12 hours later (use coupon and let me know if they won't honor it    I will take you out of work until end of April 2020    Will arrange for televisit last week in April to see if you can return to work or not.

## 2018-05-25 NOTE — Progress Notes (Signed)
Subjective:    Patient ID: Sue Mitchell, female   DOB: [birth year]     MRN: [id number]   Brief patient profile:  62 yobf  RT quit smoking 2011 @ wt 140  with h/o sinus symptoms assoc with cough and need for saba prn as teenager while living in [place] but after arrival here at age late 81's it changed to point where bothered her mostly with onset of cold weather typically req ov rx  abx and prednisone help a lot and this pattern continued even after quit smoking to where the episodes last longer and onset of this not directed linked to cold weather in fact  had one April 2018 never  Completely cleared  then flared again in Oct 01 2016 severe dry cough / eval by allergist /ent since onset neg w/u so referred by Dr Doran Heater to pulmonary clinic 12/26/16     History of Present Illness  12/26/2016 1st [facility] office visit/ Wert   Chief Complaint  Patient presents with  . Pulm Consult    Pt referred by Dr. Billy Fischer ENT. Pt has productive cough-clear mucus sometimes yellow. Pt had horseness in voice for 6-8 weeks, no chills or fever, have some chest pain and tightness with cough.  recurrent cough since April 2018 never resolved p rx as uri then flaired early August 2018 rx zpak/pred/saba> some better  Cough was worse at hs now   Am feels wheezy not really coughing much up but what she does is white and < 1 tbsp Cough seems worse with exp to cold air at work  Assoc overt hb better on zantac  Some gen ant chest discomfort with severe coughing fits  rec Plan A = Automatic =  symbicort 80 Take 2 puffs first thing in am and then another 2 puffs about 12 hours later.  Plan B = Backup Only use your albuterol as a rescue medication  Start zantac 150- 300 mg after bfast and after supper until cough better  GERD diet    02/06/2017  f/u ov/Wert re:  GOLD I copd/ cough on cold air exp  Chief Complaint  Patient presents with  . Follow-up    Cough had improved but then worsened again  with colder weather. She is using her albuterol inhaler 2 x per wk on average.   not limited by doe / steps ok except for knees  Some gerd at hs taking h2 am only  rec Pantoprazole (protonix) 40 mg  Take  30-60 min before first meal of the day and Zantac  300 mg bedtime until return to office - this is the best way to tell whether stomach acid is contributing to your problem.   GERD diet  Please schedule a follow up office visit in 6 weeks, call sooner if needed     10/21/2017  f/u ov/Wert re: acute refractory cough / saw allergist cannot name "all neg"  ? [name]  Chief Complaint  Patient presents with  . Follow-up    Last seen by [name] 02/06/17. Patient states she has had a productive cough, post nasal drip, and constantly using her voice. At night, she becomes so congested that she has to breathe through her mouth instead of nose.    from last ov until 09/25/17 fine on on ranitidine one bid/symb 80 2bid Never took protonix  Was still needing saba twice a week at baseline and a lot more while working at [employer]  Then abruptly  worse Aug 1  lots sinus drainage clear > zpak early in Aug by [name] some better for a week or two then worse says ran out of symbicort one day prior to ov / using lots of saba now and severe nasal congestion as well as hoarseness and can't sleep due to cough rec Plan A = Automatic = symbiocort 80 Take 2 puffs first thing in am and then another 2 puffs about 12 hours later.  Plan B = Backup Only use your albuterol as a rescue medication  Prednisone 10 mg take  4 each am x 2 days,   2 each am x 2 days,  1 each am x 2 days and stop  Pantoprazole (protonix) 40 mg   Take  30-60 min before first meal of the day and ranitidine 300 mg each evening GERD diet      11/04/2017  f/u ov/Wert re: atypical asthma in RT  Chief Complaint  Patient presents with  . Follow-up    Pt c/o sneezing, wheezing and cough with clear sputum- started after she mowed her lawn 1 day ago. She  has had to use her albuterol inhaler.   wakes up feeling good most am's s cough/ wheeze about 30 min later takes first 2 pffs of symb 80 around 5 15 am  Then next 2 pffs after 9 pm  Had been doing great prior mowing grass one day prior to OV Rarely need saba since last flare    Not limited by breathing from desired activities   rec Continue symbicort 80 Take 2 puffs first thing in am and then another 2 puffs about 12 hours later thru spacer Add singulair 10 mg each pm    12/04/2017  f/u ov/Wert re:   symb 80 2bid/ singualir sometimes  Chief Complaint  Patient presents with  . Follow-up    PFT today, continues to have cough with hoarseness. She uses her albuterol inhaler 1 x per wk on average.   Dyspnea:  fine Cough: p grass exp / chemicals/ cologne dry Sleeping: flat/ one pillow no noct symptms SABA use: rare 02: none   Flare of cough / drainage 12/01/17 > has not tried otc's Plan A = Automatic = symbicort 80 Take 2 puffs first thing in am and then another 2 puffs about 12 hours later and remember to breath it out through your nose  singulair  10 mg every evening  Plan B = Backup Only use your albuterol as a rescue medication  For nasal symptoms > zyrtec 10 mg on at bedtime as needed > too sleepy    02/20/18 NP ov rec Augmentin/ pred/ antihistamine/singulair > no better and could not take singulair / did not try zyrtec in am "makes me sleepy"     03/11/2018 extended  f/u ov/Wert re: refractory  Cough since aug 2018    Chief Complaint  Patient presents with  . Follow-up    Cough not improving. She is coughing up clear to white sputum. She states her cough is worse when she goes to work and when she first lies down at night. She is using her albuterol inhaler once daily on average.   Dyspnea:  Not limited by breathing from desired activities   Cough: worse around 5pm  Then immediately when supine x then some during the night, never took zyrtec at hs as rec with sense of globus and  throat / chest "congestion" but no excess mucus  Sleeping: on flat bed on side  SABA use: ?  if neb helps more than hfa - not really  Sure but "it sure makes me shake" Still using mint products  rec Symbicort 80  Take 2 puffs first thing in am and then another 2 puffs about 12 hours later.  Pantoprazole (protonix) 40 mg   Take  30-60 min before first meal of the day and Pepcid (famotidine)  20 mg one @  bedtime until return to office - this is the best way to tell whether stomach acid is contributing to your problem.   GERD diet  Try zyrtec 10 mg after supper  Take delsym two tsp every 12 hours and supplement if needed with  vicodin up to 1every 4 hours to suppress the urge to cough. Swallowing water and/or using ice chips/non mint and menthol containing candies (such as lifesavers or sugarless jolly ranchers) are also effective.  You should rest your voice and avoid activities that you know make you cough. Once you have eliminated the cough for 3 straight days try reducing the vicodin first,  then the delsym as tolerated.   On 03/14/18 Prednisone 10 mg take  4 each am x 2 days,   2 each am x 2 days,  1 each am x 2 days and stop  Please schedule a follow up office visit in 2 weeks, sooner if needed  with all medications /inhalers/ solutions in hand so we can verify exactly what you are taking. This includes all medications from all doctors and over the counters    03/26/2018  f/u ov/Wert re: refractory cough/ wheeze on symb 80 2bid  Chief Complaint  Patient presents with  . Follow-up    Pt c/o sinus congestion, cough with yellow sputum and wheezing. She is using her albuterol inhaler once per wk on average.   Dyspnea:  Ok unless cough Cough:  at work and at home same severity/ frequency  Sleeping: not coughing while sleeping  SABA use:  Not really helping 02: none  gen ant chest soreness from coughing/ not able to use vicodin daytime to suppress cough rec  Only use your albuterol as a rescue  medication  Please see patient coordinator before you leave today  to schedule sinus CT Please remember to go to the  x-ray department  for your tests - we will call you with the results when they are available  Please schedule a follow up office visit in 2 weeks, sooner if needed  with all medications /inhalers/ solutions in hand so we can verify exactly what you are taking. This includes all medications from all doctors and over the counters       04/08/2018  f/u ov/Wert re: refractory cough / brought just her inhalers to [facility]   Chief Complaint  Patient presents with  . Acute Visit    Pt has had complaints of sinius drainage and a cough x4 days and also states she has had problems sleeping. Pt also has been hurting everywhere, feeling weak, and has had sweats.  Dyspnea: usually only sob when coughing Cough: white mucus / min production "but feels like it's choking her"  Sleeping: on side bed pillows too heavy for bed blocks/ worse cough now at hs / not using cough suppression as advised  SABA use: rarely / symbicort count on 50 so missed about 14 doses or continued to use beyond the red line on the prior/ warned about both 02: none Reported after much back and forth "felt good only while on prednisone" and gradually worse  off it  - turns out "felt good" does not mean the cough was gone but breathing was better. rec Prednisone 10mg   Take 4 for three days 3 for three days 2 for three days 1 for three days and stop Plan A = Automatic = Symbicort 80 Take 2 puffs first thing in am and then another 2 puffs about 12 hours later and spiriva 2 puff each am  Plan B = Backup Only use your albuterol inhaler as a rescue medication   Try gabapentin 100 mg twice daily bfast and supper and if can't take it twice daily just take at bedtime  For drainage / throat tickle try take CHLORPHENIRAMINE  4 mg (chlortabs  [facility])   Stay on protonix Take 30- 60 min before your first and last meals of the day   Take delsym two tsp every 12 hours and supplement if needed with  vicodine  up to 2 every 4 hours to suppress the urge to cough. .  Please schedule a follow up office visit in 2 weeks, sooner if needed  with all medications /inhalers/ solutions in hand so we can verify exactly what you are taking. This includes all medications from all doctors and over the counters - needs spirometry on return     Admit date: 04/14/2018 Discharge date: 04/18/2018   Equipment/Devices: Nebulizer with albuterol sol'n, 2L oxygen ordered several days prior to discharge. Apparently patient left stating she would get the nebulizer and oxygen from [facility] and did not want to wait for it to be delivered to the room per care management. Per CM note, this will be delivered to the home.   Brief/Interim Summary: Alfonzo FellerJohnetta Sandersis a 63 y.o.femalerespiratory therapist with a history of allergic asthma who presented to the ED 2/18, hours after discharge after admission for hypoxia due to influenza, with worsened dyspnea. At the time of discharge she reportedly maintained sufficient oxygenation during ambulation, but in the ED she was found to be hypoxic, wheezing, coughing, and weak, and was subsequently readmitted. With continued treatment including IV steroids she's subjectively significantly improved. She was weaned from oxygen while at rest and still desaturates on ambulation. She is discharged with DME in stable condition with follow up in 48 hours.  Discharge Diagnoses:  Principal Problem:   Acute respiratory failure with hypoxia (HCC) Active Problems:   Cough variant asthma  vs uacs wit vcd   Influenza   Cough  Acute hypoxic respiratory failure due to influenza A and asthma exacerbation:  - Completed5 days of tamiflu - Continue symbicort, spiriva respimat - Scheduled and prnalbuterol nebulizer (provided at DC) -Wheezing has improved so converted to po steroid with improvement, has prednisone  at home, ordered to take 40mg  po daily until follow up.  - Continue supplemental oxygen to maintain SpO2 >90%. Despite significant improvement, does still need O2 at discharge. Will anticipate continued improvement and liberation from oxygen, will need recheck at follow up office visit with pulmonology the coming week. - Continue antitussive. PMPAware queried, has gotten hydrocodone-APAP from Dr. Sherene SiresWert 2/12 and cough syrup intermittently from PCP.   Hemoptysis: CTA chest motion-degraded at last admission, no PE.  - Resolved.   Hypertension:  - Continue norvasc and HCTZ since recently stopping ARB for concern of exacerbating cough.   GERD:  - Continue PPI    04/20/2018  Extended f/u ov/Wert re: post hosp f/u  Chief Complaint  Patient presents with  . Follow-up    Breathing has improved. She is not  using her albuterol inhaler but is using albuterol neb about 3 x per day.   Dyspnea:  MMRC2 = can't walk a nl pace on a flat grade s sob but does fine slow and flat  Cough: gone/ min urge to clear throat but now on tussionex Sleeping: side / pillows to prop up  SABA use: way over using as above  02: none at all  rec Continue pantoprazole 40 mg Take 30- 60 min before your first and last meals of the day  Gabapentin  Restart 100 mg twice daily bfast and bedtime Plan A = Automatic = Symbicort 80 Take 2 puffs first thing in am and then another 2 puffs about 12 hours later.  Work on inhaler technique:   Plan B = Backup Only use your albuterol inhaler as a rescue medication  Plan C = Crisis - only use your albuterol nebulizer if you first try Plan B and it fails to help > ok to use the nebulizer up to every 4 hours but if start needing it regularly call for immediate appointment Finish the prednisone and stop spiriva F/u in 2 weeks with meds in hand (#35 on present symb 80 and one sample given) Add:  Consider performist/bud neb if can't wean off pred/saba neb and hfa makes her cough worse due  to effects on upper airway    05/04/2018  f/u ov/Wert re:   symbicort 80 one full at home and total of 45 puffs samples/ 32 gabapenitn  Chief Complaint  Patient presents with  . Acute Visit    Cough has improved but has not resolved. She is coughing up min, clear sputum. She has not had to use her albuterol inhaler.   Dyspnea:  MMRC1 = can walk nl pace, flat grade, can't hurry or go uphills or steps s sob   Cough: p exertion Sleeping: fine 30 degrees on pillows  SABA use: no saba  02: no rec rec Increase gabapentin to 100 mg three times a day  Ok to return to work 05/16/2018  Please schedule a follow up office visit in 3 weeks, sooner if needed  with all medications /inhalers/ solutions in hand so we can verify exactly what you are taking. This includes all medications from all doctors and over the counters     Virtual Visit via Telephone Note 05/25/2018   I connected with Sue Mitchell on 05/25/18 at  9:45 AM EDT by telephone and verified that I am speaking with the correct person using two identifiers.   I discussed the limitations, risks, security and privacy concerns of performing an evaluation and management service by telephone and the availability of in person appointments. I also discussed with the patient that there may be a patient responsible charge related to this service. The patient expressed understanding and agreed to proceed.   History of Present Illness: 05/25/2018   televist/Wert re: cough/ sob symbicort 80 2bid /still on samples "half full"  Dyspnea:  Walked for 5 minutes some inclines  Cough: some daytime / dry assoc with hoarseness  Sleeping: flat bed/ 3 pillows  SABA use: none  02: none    No obvious day to day or daytime variability or assoc excess/ purulent sputum or mucus plugs or hemoptysis or cp or chest tightness, subjective wheeze or overt sinus or hb symptoms.   sleeping without nocturnal  or early am exacerbation  of respiratory  c/o's or need for  noct saba. Also denies any obvious fluctuation of symptoms with  weather or environmental changes or other aggravating or alleviating factors except as outlined above   No unusual exposure hx or h/o childhood pna/ asthma or knowledge of premature birth.  Current Allergies, Complete Past Medical History, Past Surgical History, Family History, and Social History were reviewed in [employer] record.  ROS  The following are not active complaints unless bolded Hoarseness, sore throat, dysphagia, dental problems, itching, sneezing,  nasal congestion or discharge of excess mucus or purulent secretions, ear ache,   fever, chills, sweats, unintended wt loss or wt gain, classically pleuritic or exertional cp,  orthopnea pnd or arm/hand swelling  or leg swelling, presyncope, palpitations, abdominal pain, anorexia, nausea, vomiting, diarrhea  or change in bowel habits or change in bladder habits, change in stools or change in urine, dysuria, hematuria,  rash, arthralgias, visual complaints, headache, numbness, weakness or ataxia or problems with walking or coordination,  change in mood or  memory.            Observations/Objective: Still mod hoarse but talking in full sentences s coughing    Assessment and Plan:   Follow Up Instructions:    I discussed the assessment and treatment plan with the patient. The patient was provided an opportunity to ask questions and all were answered. The patient agreed with the plan and demonstrated an understanding of the instructions.   The patient was advised to call back or seek an in-person evaluation if the symptoms worsen or if the condition fails to improve as anticipated.  I provided 25 minutes of non-face-to-face time during this encounter.   Sandrea Hughs, MD

## 2018-05-26 ENCOUNTER — Other Ambulatory Visit: Payer: Self-pay | Admitting: Family Medicine

## 2018-05-26 ENCOUNTER — Ambulatory Visit
Admission: RE | Admit: 2018-05-26 | Discharge: 2018-05-26 | Disposition: A | Discharge status: Home / Self Care | Payer: BLUE CROSS/BLUE SHIELD | Source: Ambulatory Visit | Attending: Family Medicine | Admitting: Family Medicine

## 2018-05-26 ENCOUNTER — Other Ambulatory Visit: Payer: Self-pay

## 2018-05-26 DIAGNOSIS — M25551 Pain in right hip: Secondary | ICD-10-CM | POA: Diagnosis not present

## 2018-05-26 DIAGNOSIS — E1169 Type 2 diabetes mellitus with other specified complication: Secondary | ICD-10-CM | POA: Diagnosis not present

## 2018-05-26 DIAGNOSIS — R52 Pain, unspecified: Secondary | ICD-10-CM

## 2018-05-26 DIAGNOSIS — M130 Polyarthritis, unspecified: Secondary | ICD-10-CM | POA: Diagnosis not present

## 2018-05-26 DIAGNOSIS — I10 Essential (primary) hypertension: Secondary | ICD-10-CM | POA: Diagnosis not present

## 2018-05-26 DIAGNOSIS — M461 Sacroiliitis, not elsewhere classified: Secondary | ICD-10-CM | POA: Diagnosis not present

## 2018-05-26 DIAGNOSIS — G4730 Sleep apnea, unspecified: Secondary | ICD-10-CM | POA: Diagnosis not present

## 2018-06-11 ENCOUNTER — Telehealth: Payer: Self-pay | Admitting: Internal Medicine

## 2018-06-11 DIAGNOSIS — M461 Sacroiliitis, not elsewhere classified: Secondary | ICD-10-CM | POA: Diagnosis not present

## 2018-06-11 NOTE — Telephone Encounter (Signed)
Rec'd completed forms back from [place]. Fwd to [employer] via interoffice mail -pr

## 2018-06-11 NOTE — Telephone Encounter (Signed)
I have placed forms in Dr Thurston Hole lookat to be completed

## 2018-06-19 ENCOUNTER — Ambulatory Visit (INDEPENDENT_AMBULATORY_CARE_PROVIDER_SITE_OTHER): Payer: BLUE CROSS/BLUE SHIELD | Admitting: Internal Medicine

## 2018-06-19 ENCOUNTER — Encounter: Payer: Self-pay | Admitting: Internal Medicine

## 2018-06-19 ENCOUNTER — Other Ambulatory Visit: Payer: Self-pay

## 2018-06-19 DIAGNOSIS — J45991 Cough variant asthma: Secondary | ICD-10-CM | POA: Diagnosis not present

## 2018-06-19 NOTE — Progress Notes (Signed)
Subjective:    Patient ID: Sue Mitchell, female   DOB: [birth year]     MRN: [id number]   Brief patient profile:  62 yobf  RT quit smoking 2011 @ wt 140  with h/o sinus symptoms assoc with cough and need for saba prn as teenager while living in [place] but after arrival here at age late 81's it changed to point where bothered her mostly with onset of cold weather typically req ov rx  abx and prednisone help a lot and this pattern continued even after quit smoking to where the episodes last longer and onset of this not directed linked to cold weather in fact  had one April 2018 never  Completely cleared  then flared again in Oct 01 2016 severe dry cough / eval by allergist /ent since onset neg w/u so referred by Dr Doran Heater to pulmonary clinic 12/26/16     History of Present Illness  12/26/2016 1st [name]/ Sue Mitchell   Chief Complaint  Patient presents with  . Pulm Consult    Pt referred by Dr. Billy Fischer ENT. Pt has productive cough-clear mucus sometimes yellow. Pt had horseness in voice for 6-8 weeks, no chills or fever, have some chest pain and tightness with cough.  recurrent cough since April 2018 never resolved p rx as uri then flaired early August 2018 rx zpak/pred/saba> some better  Cough was worse at hs now   Am feels wheezy not really coughing much up but what she does is white and < 1 tbsp Cough seems worse with exp to cold air at work  Assoc overt hb better on zantac  Some gen ant chest discomfort with severe coughing fits  rec Plan A = Automatic =  symbicort 80 Take 2 puffs first thing in am and then another 2 puffs about 12 hours later.  Plan B = Backup Only use your albuterol as a rescue medication  Start zantac 150- 300 mg after bfast and after supper until cough better  GERD diet    02/06/2017  f/u ov/Sue Mitchell re:  GOLD I copd/ cough on cold air exp  Chief Complaint  Patient presents with  . Follow-up    Cough had improved but then worsened again  with colder weather. She is using her albuterol inhaler 2 x per wk on average.   not limited by doe / steps ok except for knees  Some gerd at hs taking h2 am only  rec Pantoprazole (protonix) 40 mg  Take  30-60 min before first meal of the day and Zantac  300 mg bedtime until return to office - this is the best way to tell whether stomach acid is contributing to your problem.   GERD diet  Please schedule a follow up office visit in 6 weeks, call sooner if needed     10/21/2017  f/u ov/Sue Mitchell re: acute refractory cough / saw allergist cannot name "all neg"  ? [name]  Chief Complaint  Patient presents with  . Follow-up    Last seen by [name] 02/06/17. Patient states she has had a productive cough, post nasal drip, and constantly using her voice. At night, she becomes so congested that she has to breathe through her mouth instead of nose.    from last ov until 09/25/17 fine on on ranitidine one bid/symb 80 2bid Never took protonix  Was still needing saba twice a week at baseline and a lot more while working at [employer]  Then abruptly  worse Aug 1  lots sinus drainage clear > zpak early in Aug by [name] some better for a week or two then worse says ran out of symbicort one day prior to ov / using lots of saba now and severe nasal congestion as well as hoarseness and can't sleep due to cough rec Plan A = Automatic = symbiocort 80 Take 2 puffs first thing in am and then another 2 puffs about 12 hours later.  Plan B = Backup Only use your albuterol as a rescue medication  Prednisone 10 mg take  4 each am x 2 days,   2 each am x 2 days,  1 each am x 2 days and stop  Pantoprazole (protonix) 40 mg   Take  30-60 min before first meal of the day and ranitidine 300 mg each evening GERD diet      11/04/2017  f/u ov/Sue Mitchell re: atypical asthma in RT  Chief Complaint  Patient presents with  . Follow-up    Pt c/o sneezing, wheezing and cough with clear sputum- started after she mowed her lawn 1 day ago. She  has had to use her albuterol inhaler.   wakes up feeling good most am's s cough/ wheeze about 30 min later takes first 2 pffs of symb 80 around 5 15 am  Then next 2 pffs after 9 pm  Had been doing great prior mowing grass one day prior to OV Rarely need saba since last flare    Not limited by breathing from desired activities   rec Continue symbicort 80 Take 2 puffs first thing in am and then another 2 puffs about 12 hours later thru spacer Add singulair 10 mg each pm    12/04/2017  f/u ov/Sue Mitchell re:   symb 80 2bid/ singualir sometimes  Chief Complaint  Patient presents with  . Follow-up    PFT today, continues to have cough with hoarseness. She uses her albuterol inhaler 1 x per wk on average.   Dyspnea:  fine Cough: p grass exp / chemicals/ cologne dry Sleeping: flat/ one pillow no noct symptms SABA use: rare 02: none   Flare of cough / drainage 12/01/17 > has not tried otc's Plan A = Automatic = symbicort 80 Take 2 puffs first thing in am and then another 2 puffs about 12 hours later and remember to breath it out through your nose  singulair  10 mg every evening  Plan B = Backup Only use your albuterol as a rescue medication  For nasal symptoms > zyrtec 10 mg on at bedtime as needed > too sleepy    02/20/18 NP ov rec Augmentin/ pred/ antihistamine/singulair > no better and could not take singulair / did not try zyrtec in am "makes me sleepy"     03/11/2018 extended  f/u ov/Sue Mitchell re: refractory  Cough since aug 2018    Chief Complaint  Patient presents with  . Follow-up    Cough not improving. She is coughing up clear to white sputum. She states her cough is worse when she goes to work and when she first lies down at night. She is using her albuterol inhaler once daily on average.   Dyspnea:  Not limited by breathing from desired activities   Cough: worse around 5pm  Then immediately when supine x then some during the night, never took zyrtec at hs as rec with sense of globus and  throat / chest "congestion" but no excess mucus  Sleeping: on flat bed on side  SABA use: ?  if neb helps more than hfa - not really  Sure but "it sure makes me shake" Still using mint products  rec Symbicort 80  Take 2 puffs first thing in am and then another 2 puffs about 12 hours later.  Pantoprazole (protonix) 40 mg   Take  30-60 min before first meal of the day and Pepcid (famotidine)  20 mg one @  bedtime until return to office - this is the best way to tell whether stomach acid is contributing to your problem.   GERD diet  Try zyrtec 10 mg after supper  Take delsym two tsp every 12 hours and supplement if needed with  vicodin up to 1every 4 hours to suppress the urge to cough. Swallowing water and/or using ice chips/non mint and menthol containing candies (such as lifesavers or sugarless jolly ranchers) are also effective.  You should rest your voice and avoid activities that you know make you cough. Once you have eliminated the cough for 3 straight days try reducing the vicodin first,  then the delsym as tolerated.   On 03/14/18 Prednisone 10 mg take  4 each am x 2 days,   2 each am x 2 days,  1 each am x 2 days and stop  Please schedule a follow up office visit in 2 weeks, sooner if needed  with all medications /inhalers/ solutions in hand so we can verify exactly what you are taking. This includes all medications from all doctors and over the counters    03/26/2018  f/u ov/Sue Mitchell re: refractory cough/ wheeze on symb 80 2bid  Chief Complaint  Patient presents with  . Follow-up    Pt c/o sinus congestion, cough with yellow sputum and wheezing. She is using her albuterol inhaler once per wk on average.   Dyspnea:  Ok unless cough Cough:  at work and at home same severity/ frequency  Sleeping: not coughing while sleeping  SABA use:  Not really helping 02: none  gen ant chest soreness from coughing/ not able to use vicodin daytime to suppress cough rec  Only use your albuterol as a rescue  medication  Please see patient coordinator before you leave today  to schedule sinus CT Please remember to go to the  x-ray department  for your tests - we will call you with the results when they are available  Please schedule a follow up office visit in 2 weeks, sooner if needed  with all medications /inhalers/ solutions in hand so we can verify exactly what you are taking. This includes all medications from all doctors and over the counters       04/08/2018  f/u ov/Sue Mitchell re: refractory cough / brought just her inhalers to [facility]   Chief Complaint  Patient presents with  . Acute Visit    Pt has had complaints of sinius drainage and a cough x4 days and also states she has had problems sleeping. Pt also has been hurting everywhere, feeling weak, and has had sweats.  Dyspnea: usually only sob when coughing Cough: white mucus / min production "but feels like it's choking her"  Sleeping: on side bed pillows too heavy for bed blocks/ worse cough now at hs / not using cough suppression as advised  SABA use: rarely / symbicort count on 50 so missed about 14 doses or continued to use beyond the red line on the prior/ warned about both 02: none Reported after much back and forth "felt good only while on prednisone" and gradually worse  off it  - turns out "felt good" does not mean the cough was gone but breathing was better. rec Prednisone 10mg   Take 4 for three days 3 for three days 2 for three days 1 for three days and stop Plan A = Automatic = Symbicort 80 Take 2 puffs first thing in am and then another 2 puffs about 12 hours later and spiriva 2 puff each am  Plan B = Backup Only use your albuterol inhaler as a rescue medication   Try gabapentin 100 mg twice daily bfast and supper and if can't take it twice daily just take at bedtime  For drainage / throat tickle try take CHLORPHENIRAMINE  4 mg (chlortabs  [facility])   Stay on protonix Take 30- 60 min before your first and last meals of the day   Take delsym two tsp every 12 hours and supplement if needed with  vicodine  up to 2 every 4 hours to suppress the urge to cough. .  Please schedule a follow up office visit in 2 weeks, sooner if needed  with all medications /inhalers/ solutions in hand so we can verify exactly what you are taking. This includes all medications from all doctors and over the counters - needs spirometry on return     Admit date: 04/14/2018 Discharge date: 04/18/2018   Equipment/Devices: Nebulizer with albuterol sol'n, 2L oxygen ordered several days prior to discharge. Apparently patient left stating she would get the nebulizer and oxygen from [facility] and did not want to wait for it to be delivered to the room per care management. Per CM note, this will be delivered to the home.   Brief/Interim Summary: Alfonzo FellerJohnetta Sandersis a 63 y.o.femalerespiratory therapist with a history of allergic asthma who presented to the ED 2/18, hours after discharge after admission for hypoxia due to influenza, with worsened dyspnea. At the time of discharge she reportedly maintained sufficient oxygenation during ambulation, but in the ED she was found to be hypoxic, wheezing, coughing, and weak, and was subsequently readmitted. With continued treatment including IV steroids she's subjectively significantly improved. She was weaned from oxygen while at rest and still desaturates on ambulation. She is discharged with DME in stable condition with follow up in 48 hours.  Discharge Diagnoses:  Principal Problem:   Acute respiratory failure with hypoxia (HCC) Active Problems:   Cough variant asthma  vs uacs wit vcd   Influenza   Cough  Acute hypoxic respiratory failure due to influenza A and asthma exacerbation:  - Completed5 days of tamiflu - Continue symbicort, spiriva respimat - Scheduled and prnalbuterol nebulizer (provided at DC) -Wheezing has improved so converted to po steroid with improvement, has prednisone  at home, ordered to take 40mg  po daily until follow up.  - Continue supplemental oxygen to maintain SpO2 >90%. Despite significant improvement, does still need O2 at discharge. Will anticipate continued improvement and liberation from oxygen, will need recheck at follow up office visit with pulmonology the coming week. - Continue antitussive. PMPAware queried, has gotten hydrocodone-APAP from Dr. Sherene SiresWert 2/12 and cough syrup intermittently from PCP.   Hemoptysis: CTA chest motion-degraded at last admission, no PE.  - Resolved.   Hypertension:  - Continue norvasc and HCTZ since recently stopping ARB for concern of exacerbating cough.   GERD:  - Continue PPI    04/20/2018  Extended f/u ov/Sue Mitchell re: post hosp f/u  Chief Complaint  Patient presents with  . Follow-up    Breathing has improved. She is not  using her albuterol inhaler but is using albuterol neb about 3 x per day.   Dyspnea:  MMRC2 = can't walk a nl pace on a flat grade s sob but does fine slow and flat  Cough: gone/ min urge to clear throat but now on tussionex Sleeping: side / pillows to prop up  SABA use: way over using as above  02: none at all  rec Continue pantoprazole 40 mg Take 30- 60 min before your first and last meals of the day  Gabapentin  Restart 100 mg twice daily bfast and bedtime Plan A = Automatic = Symbicort 80 Take 2 puffs first thing in am and then another 2 puffs about 12 hours later.  Work on inhaler technique:   Plan B = Backup Only use your albuterol inhaler as a rescue medication  Plan C = Crisis - only use your albuterol nebulizer if you first try Plan B and it fails to help > ok to use the nebulizer up to every 4 hours but if start needing it regularly call for immediate appointment Finish the prednisone and stop spiriva F/u in 2 weeks with meds in hand (#35 on present symb 80 and one sample given) Add:  Consider performist/bud neb if can't wean off pred/saba neb and hfa makes her cough worse due  to effects on upper airway    05/04/2018  f/u ov/Sue Mitchell re:   symbicort 80 one full at home and total of 45 puffs samples/ 32 gabapenitn  Chief Complaint  Patient presents with  . Acute Visit    Cough has improved but has not resolved. She is coughing up min, clear sputum. She has not had to use her albuterol inhaler.   Dyspnea:  MMRC1 = can walk nl pace, flat grade, can't hurry or go uphills or steps s sob   Cough: p exertion Sleeping: fine 30 degrees on pillows  SABA use: no saba  02: no rec rec Increase gabapentin to 100 mg three times a day  Ok to return to work 05/16/2018  Please schedule a follow up office visit in 3 weeks, sooner if needed  with all medications     Virtual Visit via Telephone Note 05/25/2018   I connected with Sue RougeJohnetta Mitchell on 05/25/18 at  9:45 AM EDT by telephone and verified that I am speaking with the correct person using two identifiers.   I discussed the limitations, risks, security and privacy concerns of performing an evaluation and management service by telephone and the availability of in person appointments. I also discussed with the patient that there may be a patient responsible charge related to this service. The patient expressed understanding and agreed to proceed.   History of Present Illness: 05/25/2018   televist/Sue Mitchell re: cough/ sob symbicort 80 2bid /still on samples "half full"  Dyspnea:  Walked for 5 minutes some inclines  Cough: some daytime / dry assoc with hoarseness  Sleeping: flat bed/ 3 pillows  SABA use: none  02: none  rec For drainage / throat tickle try take CHLORPHENIRAMINE  4 mg (chlortab 4 mg) - take one every 4 hours as needed  Increase gabapentin to 100 mg four times daily  Continue Symbicort 80 Take 2 puffs first thing in am and then another 2 puffs about 12 hours later (use coupon and let me know if they won't honor it  I will take you out of work until end of April 2020  Will arrange for televisit last week in April to  see if you can return to work or not.     Virtual Visit via Telephone Note 06/19/2018 AB/ chronic cough with ? vcd component  I connected with Sue Mitchell on 06/19/18 at  3:00 PM EDT by telephone and verified that I am speaking with the correct person using two identifiers.   I discussed the limitations, risks, security and privacy concerns of performing an evaluation and management service by telephone and the availability of in person appointments. I also discussed with the patient that there may be a patient responsible charge related to this service. The patient expressed understanding and agreed to proceed.   History of Present Illness: Dyspnea:  Limited by R hip and L knee  Cough: tickle/drainage better, using h1 no more than bid  s side effects/ continuing  gabapentin 100 qid Sleeping: on side / pillows  SABA use:  on symbicort 80 2 bid / rarely saba  02: none     No obvious day to day or daytime variability or assoc excess/ purulent sputum or mucus plugs or hemoptysis or cp or chest tightness, subjective wheeze or overt sinus or hb symptoms.    Also denies any obvious fluctuation of symptoms with weather or environmental changes or other aggravating or alleviating factors except as outlined above.   Meds reviewed/ med reconciliation completed      Observations/Objective: Improved phonation from prior visits/ speaking full sentences/ minimal dry sounding  cough during interview   Assessment and Plan: See problem list for active a/p's   Follow Up Instructions: See avs for instructions unique to this ov which includes revised/ updated med list     I discussed the assessment and treatment plan with the patient. The patient was provided an opportunity to ask questions and all were answered. The patient agreed with the plan and demonstrated an understanding of the instructions.   The patient was advised to call back or seek an in-person evaluation if the symptoms  worsen or if the condition fails to improve as anticipated.  I provided 23 minutes of non-face-to-face time during this encounter.   Sandrea Hughs, MD

## 2018-06-19 NOTE — Patient Instructions (Addendum)
Needs copy of this AVS  Defer work note to Dr Parke Simmers re back  For drainage / throat tickle try take CHLORPHENIRAMINE  4 mg  (Chlortab 4mg   at [employer] should be easiest to find in the green box)  take one every 4 hours as needed - available over the counter- may cause drowsiness so start with just a bedtime dose or two and see how you tolerate it before trying in daytime     Elevate head of bed on 6-8 inch bed block  We will arrange for televisit 4 weeks, call sooner if needed

## 2018-06-21 ENCOUNTER — Encounter: Payer: Self-pay | Admitting: Internal Medicine

## 2018-06-21 NOTE — Assessment & Plan Note (Addendum)
Recurrent cough since teenager really bad and daily since Aug 2018 12/26/2016  rec symbicort 80 2bid then return for full pfts - PFT's  02/06/2017  FEV1 1.37 (80 % ) ratio 67  p 5 % improvement from saba p symb 80  prior to study with DLCO  66/66c % corrects to 79  % for alv volume   - Spirometry 10/21/2017  FEV1 1.18 (70%)  Ratio 71 with min curvature during attack of cough/ mostly pseudowheeze on exam on symb 80 x2  - FENO 10/21/2017  =   7  On symb 80 x 2  - 11/04/2017     90% with spacer  - singulair added 11/04/2017  But not taking consistently as of 12/04/2017 > rec restart daily  X one week and stopped it due "dizzy"  - PFT's  12/04/2017  FEV1 1.34 (85 % ) ratio 67  p 20 % improvement from saba p nothing prior to study with DLCO  71 % corrects to 80  % for alv volume   - Allergy profile 12/04/2017 >  Eos 0.0 /  IgE  546 dust only  - cyclical cough rx 03/11/2018  - Spirometry 03/26/2018  FEV1 1.1 (73%)  Ratio 0.64 - 03/26/2018  After extensive coaching inhaler device,  effectiveness =    90% s spacer  - Gabapentin 100 mg tid trial 03/26/2018> attempted rx by non-adherent  - Sinus CT 04/07/2018 > Negative study.  No evidence of paranasal sinusitis. - Spirometry 04/08/2018  FEV1 0.7 (47%)  Ratio 0.52 with convex curvature p am symb 80 x 2 and neb in office   - 04/08/2018  After extensive coaching inhaler device,  effectiveness =    75% with smi > added trial of spiriva 2pffs daily x 2 weeks then return  - CTa 04/11/18 for hemoptysis with refractory cough > Limited study due to respiratory motion. No obvious pulmonary embolism or other acute findings. - 04/20/2018  After extensive coaching inhaler device,  effectiveness =    0% with spiriva smi which caused immediate severe cough so rec d/c spiriva  - 04/20/2018 rechallenge with gabapentin 100 mg bid  - 05/04/2018  After extensive coaching inhaler device,  effectiveness =    90% with spacer - s spacer starts coughing immediately  - Spirometry 05/04/2018  FEV1  1.2 (75%)  Ratio 0.65 with mild curvature p symb 80 x 2 puffs   - 05/04/2018 increased gabapentin to 100 tid   - 05/25/2018 increased gabapentin to  100 mg qid    Finally appears to be turing the corner on rx for both cough variant asthma and uacs so no change in rx needed   Continues to have some sense of pnds and not using 1st gen H1 blockers per guidelines  > advised  Also should consider bed blocks to reduce possibility of noct reflux    She now also has back issues limiting her from returning to work and is clearly at higher risk of morbidity/mortality 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  Please see AVS for specific  Instructions which are unique to this visit and I personally typed out  which were reviewed in detail over the phone with the patient mailed a copy.

## 2018-07-01 DIAGNOSIS — M130 Polyarthritis, unspecified: Secondary | ICD-10-CM | POA: Diagnosis not present

## 2018-07-01 DIAGNOSIS — F064 Anxiety disorder due to known physiological condition: Secondary | ICD-10-CM | POA: Diagnosis not present

## 2018-07-01 DIAGNOSIS — M461 Sacroiliitis, not elsewhere classified: Secondary | ICD-10-CM | POA: Diagnosis not present

## 2018-07-02 ENCOUNTER — Telehealth: Payer: Self-pay | Admitting: Internal Medicine

## 2018-07-07 NOTE — Telephone Encounter (Signed)
Patrice do you still have these forms? Thanks

## 2018-07-08 NOTE — Telephone Encounter (Signed)
Rec'd completed forms back from [place] today. Fwd to [employer] via interoffice mail -pr

## 2018-07-09 DIAGNOSIS — F331 Major depressive disorder, recurrent, moderate: Secondary | ICD-10-CM | POA: Diagnosis not present

## 2018-07-13 ENCOUNTER — Telehealth: Payer: Self-pay | Admitting: Internal Medicine

## 2018-07-13 MED ORDER — BUDESONIDE-FORMOTEROL FUMARATE 80-4.5 MCG/ACT IN AERO: 2.0000 | INHALATION_SPRAY | Freq: Two times a day (BID) | RESPIRATORY_TRACT | 0 refills | Status: DC

## 2018-07-13 NOTE — Telephone Encounter (Signed)
Returned call to patient.  She is unable to purchase her symbicort 80 this week and asked if we could provide a sample.  Patient had recent televisit.  Advised we are limited on samples due to covid19 changes. Patient acknowledged understanding. Will provide one sample to place at front door today.  Patient will make arrangements to get symbicort filled next time.  Nothing further needed.

## 2018-07-21 ENCOUNTER — Other Ambulatory Visit: Payer: Self-pay

## 2018-07-21 ENCOUNTER — Encounter: Payer: BLUE CROSS/BLUE SHIELD | Admitting: Internal Medicine

## 2018-07-21 NOTE — Progress Notes (Signed)
 This encounter was created in error - please disregard.

## 2018-07-23 ENCOUNTER — Ambulatory Visit (INDEPENDENT_AMBULATORY_CARE_PROVIDER_SITE_OTHER): Payer: BLUE CROSS/BLUE SHIELD | Admitting: Internal Medicine

## 2018-07-23 ENCOUNTER — Encounter: Payer: Self-pay | Admitting: Internal Medicine

## 2018-07-23 ENCOUNTER — Other Ambulatory Visit: Payer: Self-pay

## 2018-07-23 ENCOUNTER — Encounter: Payer: Self-pay | Admitting: *Deleted

## 2018-07-23 VITALS — BP 142/88 | HR 92 | Temp 98.2°F | Ht <= 58 in | Wt 170.8 lb

## 2018-07-23 DIAGNOSIS — J9601 Acute respiratory failure with hypoxia: Secondary | ICD-10-CM

## 2018-07-23 DIAGNOSIS — J111 Influenza due to unidentified influenza virus with other respiratory manifestations: Secondary | ICD-10-CM | POA: Diagnosis not present

## 2018-07-23 DIAGNOSIS — J45991 Cough variant asthma: Secondary | ICD-10-CM

## 2018-07-23 MED ORDER — BUDESONIDE-FORMOTEROL FUMARATE 80-4.5 MCG/ACT IN AERO: 2.0000 | INHALATION_SPRAY | Freq: Two times a day (BID) | RESPIRATORY_TRACT | 11 refills | Status: DC

## 2018-07-23 MED ORDER — BUDESONIDE-FORMOTEROL FUMARATE 80-4.5 MCG/ACT IN AERO: 2.0000 | INHALATION_SPRAY | Freq: Two times a day (BID) | RESPIRATORY_TRACT | 0 refills | Status: DC

## 2018-07-23 NOTE — Patient Instructions (Signed)
No change in medications  Ok to leave off pm dose of pantaprozole if not coughing  Ok to return to work without restrictions on August 10 2018  Please remember to go to the lab department   for your tests - we will call you with the results when they are available.      Please schedule a follow up visit in 3 months but call sooner if needed

## 2018-07-23 NOTE — Progress Notes (Signed)
Subjective:    Patient ID: Sue Mitchell, female   DOB: [birth year]     MRN: [id number]   Brief patient profile:  62 yobf  RT quit smoking 2011 @ wt 140  with h/o sinus symptoms assoc with cough and need for saba prn as teenager while living in [place] but after arrival here at age late 81's it changed to point where bothered her mostly with onset of cold weather typically req ov rx  abx and prednisone help a lot and this pattern continued even after quit smoking to where the episodes last longer and onset of this not directed linked to cold weather in fact  had one April 2018 never  Completely cleared  then flared again in Oct 01 2016 severe dry cough / eval by allergist /ent since onset neg w/u so referred by Sue Mitchell to pulmonary clinic 12/26/16     History of Present Illness  12/26/2016 1st Sue Mitchell/ Sue Mitchell   Chief Complaint  Patient presents with  . Pulm Consult    Pt referred by Sue Mitchell ENT. Pt has productive cough-clear mucus sometimes yellow. Pt had horseness in voice for 6-8 weeks, no chills or fever, have some chest pain and tightness with cough.  recurrent cough since April 2018 never resolved p rx as uri then flaired early August 2018 rx zpak/pred/saba> some better  Cough was worse at hs now   Am feels wheezy not really coughing much up but what she does is white and < 1 tbsp Cough seems worse with exp to cold air at work  Assoc overt hb better on zantac  Some gen ant chest discomfort with severe coughing fits  rec Plan A = Automatic =  symbicort 80 Take 2 puffs first thing in am and then another 2 puffs about 12 hours later.  Plan B = Backup Only use your albuterol as a rescue medication  Start zantac 150- 300 mg after bfast and after supper until cough better  GERD diet    02/06/2017  f/u ov/Sue Mitchell re:  GOLD I copd/ cough on cold air exp  Chief Complaint  Patient presents with  . Follow-up    Cough had improved but then worsened again  with colder weather. She is using her albuterol inhaler 2 x per wk on average.   not limited by doe / steps ok except for knees  Some gerd at hs taking h2 am only  rec Pantoprazole (protonix) 40 mg  Take  30-60 min before first meal of the day and Zantac  300 mg bedtime until return to office - this is the best way to tell whether stomach acid is contributing to your problem.   GERD diet  Please schedule a follow up office Mitchell in 6 weeks, call sooner if needed     10/21/2017  f/u ov/Sue Mitchell re: acute refractory cough / saw allergist cannot name "all neg"  ? Sue Mitchell?  Chief Complaint  Patient presents with  . Follow-up    Last seen by Sue Mitchell 02/06/17. Patient states she has had a productive cough, post nasal drip, and constantly using her voice. At night, she becomes so congested that she has to breathe through her mouth instead of nose.    from last ov until 09/25/17 fine on on ranitidine one bid/symb 80 2bid Never took protonix  Was still needing saba twice a week at baseline and a lot more while working at [employer]  Then abruptly  worse Aug 1  lots sinus drainage clear > zpak early in Aug by Sue Mitchell some better for a week or two then worse says ran out of symbicort one day prior to ov / using lots of saba now and severe nasal congestion as well as hoarseness and can't sleep due to cough rec Plan A = Automatic = symbiocort 80 Take 2 puffs first thing in am and then another 2 puffs about 12 hours later.  Plan B = Backup Only use your albuterol as a rescue medication  Prednisone 10 mg take  4 each am x 2 days,   2 each am x 2 days,  1 each am x 2 days and stop  Pantoprazole (protonix) 40 mg   Take  30-60 min before first meal of the day and ranitidine 300 mg each evening GERD diet      11/04/2017  f/u ov/Sue Mitchell re: atypical asthma in RT  Chief Complaint  Patient presents with  . Follow-up    Pt c/o sneezing, wheezing and cough with clear sputum- started after she mowed her lawn 1 day ago. She  has had to use her albuterol inhaler.   wakes up feeling good most am's s cough/ wheeze about 30 min later takes first 2 pffs of symb 80 around 5 15 am  Then next 2 pffs after 9 pm  Had been doing great prior mowing grass one day prior to OV Rarely need saba since last flare    Not limited by breathing from desired activities   rec Continue symbicort 80 Take 2 puffs first thing in am and then another 2 puffs about 12 hours later thru spacer Add singulair 10 mg each pm    12/04/2017  f/u ov/Sue Mitchell re:   symb 80 2bid/ singualir sometimes  Chief Complaint  Patient presents with  . Follow-up    PFT today, continues to have cough with hoarseness. She uses her albuterol inhaler 1 x per wk on average.   Dyspnea:  fine Cough: p grass exp / chemicals/ cologne dry Sleeping: flat/ one pillow no noct symptms SABA use: rare 02: none   Flare of cough / drainage 12/01/17 > has not tried otc's Plan A = Automatic = symbicort 80 Take 2 puffs first thing in am and then another 2 puffs about 12 hours later and remember to breath it out through your nose  singulair  10 mg every evening  Plan B = Backup Only use your albuterol as a rescue medication  For nasal symptoms > zyrtec 10 mg on at bedtime as needed > too sleepy    02/20/18 NP ov rec Augmentin/ pred/ antihistamine/singulair > no better and could not take singulair / did not try zyrtec in am "makes me sleepy"     03/11/2018 extended  f/u ov/Sue Mitchell re: refractory  Cough since aug 2018    Chief Complaint  Patient presents with  . Follow-up    Cough not improving. She is coughing up clear to white sputum. She states her cough is worse when she goes to work and when she first lies down at night. She is using her albuterol inhaler once daily on average.   Dyspnea:  Not limited by breathing from desired activities   Cough: worse around 5pm  Then immediately when supine x then some during the night, never took zyrtec at hs as rec with sense of globus and  throat / chest "congestion" but no excess mucus  Sleeping: on flat bed on side  SABA use: ?  if neb helps more than hfa - not really  Sure but "it sure makes me shake" Still using mint products  rec Symbicort 80  Take 2 puffs first thing in am and then another 2 puffs about 12 hours later.  Pantoprazole (protonix) 40 mg   Take  30-60 min before first meal of the day and Pepcid (famotidine)  20 mg one @  bedtime until return to office - this is the best way to tell whether stomach acid is contributing to your problem.   GERD diet  Try zyrtec 10 mg after supper  Take delsym two tsp every 12 hours and supplement if needed with  vicodin up to 1every 4 hours to suppress the urge to cough. Swallowing water and/or using ice chips/non mint and menthol containing candies (such as lifesavers or sugarless jolly ranchers) are also effective.  You should rest your voice and avoid activities that you know make you cough. Once you have eliminated the cough for 3 straight days try reducing the vicodin first,  then the delsym as tolerated.   On 03/14/18 Prednisone 10 mg take  4 each am x 2 days,   2 each am x 2 days,  1 each am x 2 days and stop  Please schedule a follow up office Mitchell in 2 weeks, sooner if needed  with all medications /inhalers/ solutions in hand so we can verify exactly what you are taking. This includes all medications from all doctors and over the counters    03/26/2018  f/u ov/Lynne Takemoto re: refractory cough/ wheeze on symb 80 2bid  Chief Complaint  Patient presents with  . Follow-up    Pt c/o sinus congestion, cough with yellow sputum and wheezing. She is using her albuterol inhaler once per wk on average.   Dyspnea:  Ok unless cough Cough:  at work and at home same severity/ frequency  Sleeping: not coughing while sleeping  SABA use:  Not really helping 02: none  gen ant chest soreness from coughing/ not able to use vicodin daytime to suppress cough rec  Only use your albuterol as a rescue  medication  Please see patient coordinator before you leave today  to schedule sinus CT Please remember to go to the  x-ray department  for your tests - we will call you with the results when they are available  Please schedule a follow up office Mitchell in 2 weeks, sooner if needed  with all medications /inhalers/ solutions in hand so we can verify exactly what you are taking. This includes all medications from all doctors and over the counters       04/08/2018  f/u ov/Evett Kassa re: refractory cough / brought just her inhalers to [facility]   Chief Complaint  Patient presents with  . Acute Mitchell    Pt has had complaints of sinius drainage and a cough x4 days and also states she has had problems sleeping. Pt also has been hurting everywhere, feeling weak, and has had sweats.  Dyspnea: usually only sob when coughing Cough: white mucus / min production "but feels like it's choking her"  Sleeping: on side bed pillows too heavy for bed blocks/ worse cough now at hs / not using cough suppression as advised  SABA use: rarely / symbicort count on 50 so missed about 14 doses or continued to use beyond the red line on the prior/ warned about both 02: none Reported after much back and forth "felt good only while on prednisone" and gradually worse  off it  - turns out "felt good" does not mean the cough was gone but breathing was better. rec Prednisone 10mg   Take 4 for three days 3 for three days 2 for three days 1 for three days and stop Plan A = Automatic = Symbicort 80 Take 2 puffs first thing in am and then another 2 puffs about 12 hours later and spiriva 2 puff each am  Plan B = Backup Only use your albuterol inhaler as a rescue medication   Try gabapentin 100 mg twice daily bfast and supper and if can't take it twice daily just take at bedtime  For drainage / throat tickle try take CHLORPHENIRAMINE  4 mg (chlortabs  [facility])   Stay on protonix Take 30- 60 min before your first and last meals of the day   Take delsym two tsp every 12 hours and supplement if needed with  vicodine  up to 2 every 4 hours to suppress the urge to cough. .  Please schedule a follow up office Mitchell in 2 weeks, sooner if needed  with all medications /inhalers/ solutions in hand so we can verify exactly what you are taking. This includes all medications from all doctors and over the counters - needs spirometry on return     Admit date: 04/14/2018 Discharge date: 04/18/2018   Equipment/Devices: Nebulizer with albuterol sol'n, 2L oxygen ordered several days prior to discharge. Apparently patient left stating she would get the nebulizer and oxygen from [facility] and did not want to wait for it to be delivered to the room per care management. Per CM note, this will be delivered to the home.   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 At the time of discharge she reportedly maintained sufficient oxygenation during ambulation, but in the ED she was found to be hypoxic, wheezing, coughing, and weak, and was subsequently readmitted. With continued treatment including IV steroids she's subjectively significantly improved. She was weaned from oxygen while at rest and still desaturates on ambulation. She is discharged with DME in stable condition with follow up in 48 hours.  Discharge Diagnoses:  Principal Problem:   Acute respiratory failure with hypoxia (HCC)   Cough variant asthma  vs uacs wit vcd   Influenza   Cough  Acute hypoxic respiratory failure due to influenza A and asthma exacerbation:  - Completed5 days of tamiflu - Continue symbicort, spiriva respimat - Scheduled and prnalbuterol nebulizer (provided at DC) -Wheezing has improved so converted to po steroid with improvement, has prednisone at home, ordered to  take 40mg  po daily until follow up.  - Continue supplemental oxygen to maintain SpO2 >90%. Despite significant improvement, does still need O2 at discharge. Will anticipate continued improvement and liberation from oxygen, will need recheck at follow up office Mitchell with pulmonology the coming week. - Continue antitussive. PMPAware queried, has gotten hydrocodone-APAP from Sue. [name] 2/12 and cough syrup intermittently from PCP.   Hemoptysis: CTA chest motion-degraded at last admission, no PE.  - Resolved.   Hypertension:  - Continue norvasc and HCTZ since recently stopping ARB for concern of exacerbating cough.   GERD:  - Continue PPI    04/20/2018  Extended f/u ov/Toluwani Yadav re: post hosp f/u  Chief Complaint  Patient presents with  . Follow-up    Breathing has improved. She is not using her  albuterol inhaler but is using albuterol neb about 3 x per day.   Dyspnea:  MMRC2 = can't walk a nl pace on a flat grade s sob but does fine slow and flat  Cough: gone/ min urge to clear throat but now on tussionex Sleeping: side / pillows to prop up  SABA use: way over using as above  02: none at all  rec Continue pantoprazole 40 mg Take 30- 60 min before your first and last meals of the day  Gabapentin  Restart 100 mg twice daily bfast and bedtime Plan A = Automatic = Symbicort 80 Take 2 puffs first thing in am and then another 2 puffs about 12 hours later.  Work on inhaler technique:   Plan B = Backup Only use your albuterol inhaler as a rescue medication  Plan C = Crisis - only use your albuterol nebulizer if you first try Plan B and it fails to help > ok to use the nebulizer up to every 4 hours but if start needing it regularly call for immediate appointment Finish the prednisone and stop spiriva F/u in 2 weeks with meds in hand (#35 on present symb 80 and one sample given) Add:  Consider performist/bud neb if can't wean off pred/saba neb and hfa makes her cough worse due to effects on upper  airway    05/04/2018  f/u ov/Mattheu Brodersen re:   symbicort 80 one full at home and total of 45 puffs samples/ 32 gabapenitn  Chief Complaint  Patient presents with  . Acute Mitchell    Cough has improved but has not resolved. She is coughing up min, clear sputum. She has not had to use her albuterol inhaler.   Dyspnea:  MMRC1 = can walk nl pace, flat grade, can't hurry or go uphills or steps s sob   Cough: p exertion Sleeping: fine 30 degrees on pillows  SABA use: no saba  02: no rec Increase gabapentin to 100 mg three times a day  ok to return to work 05/16/2018  Please schedule a follow up office Mitchell in 3 weeks, sooner if needed  with all medications /inhalers/ solutions in hand so we can verify exactly what you are taking. This includes all medications from all doctors and over the counters        History of Present Illness: 05/25/2018   televist/Rian Busche re: cough/ sob symbicort 80 2bid /still on samples "half full"  Dyspnea:  Walked for 5 minutes some inclines  Cough: some daytime / dry assoc with hoarseness  Sleeping: flat bed/ 3 pillows  SABA use: none   rec For drainage / throat tickle try take CHLORPHENIRAMINE  4 mg (chlortab 4 mg) - take one every 4 hours as needed - available over the counter- may cause drowsiness so start with just a bedtime dose or two and see how you tolerate it before trying in daytime   Increase gabapentin to 100 mg four times daily  Continue Symbicort 80 Take 2 puffs first thing in am and then another 2 puffs about 12 hours later (use coupon and let me know if they won't honor it  I will take you out of work until end of April 2020      06/19/18 Defer work note to Sue Parke Simmers re back For drainage / throat tickle try take CHLORPHENIRAMINE  4 mg  (Chlortab 4mg   at should be easiest to find in the green box)  take one every 4 hours as needed -  available over the counter- may cause drowsiness so start with just a bedtime dose or two and see how you  tolerate it before trying in daytime   Elevate head of bed on 6-8 inch bed block   07/23/2018  f/u ov/Cinthia Rodden re: asthma with uacs  On symb 80 2bid , gapentin 100 qid /ppibid Chief Complaint  Patient presents with  . Follow-up    Breathing is much improved. She is coughing minimally- mainly in the am's. She has not had to use her rescue inhaler in over a month.  Dyspnea:  Walks x 20 mins s stopping, some hills Cough: minimal/ non productive, day >noct  Sleeping: bed is flat  3pillows for back SABA use: none  02: none    No obvious day to day or daytime variability or assoc excess/ purulent sputum or mucus plugs or hemoptysis or cp or chest tightness, subjective wheeze or overt sinus or hb symptoms.   Sleeping  without nocturnal  or early am exacerbation  of respiratory  c/o's or need for noct saba. Also denies any obvious fluctuation of symptoms with weather or environmental changes or other aggravating or alleviating factors except as outlined above   No unusual exposure hx or h/o childhood pna/ asthma or knowledge of premature birth.  Current Allergies, Complete Past Medical History, Past Surgical History, Family History, and Social History were reviewed in [facility] electronic medical record.  ROS  The following are not active complaints unless bolded Hoarseness, sore throat, dysphagia, dental problems, itching, sneezing,  nasal congestion or discharge of excess mucus or purulent secretions, ear ache,   fever, chills, sweats, unintended wt loss or wt gain, classically pleuritic or exertional cp,  orthopnea pnd or arm/hand swelling  or leg swelling, presyncope, palpitations, abdominal pain, anorexia, nausea, vomiting, diarrhea  or change in bowel habits or change in bladder habits, change in stools or change in urine, dysuria, hematuria,  rash, arthralgias, visual complaints, headache, numbness, weakness or ataxia or problems with walking or coordination,  change in mood or  memory.         Current Meds  Medication Sig  . acyclovir (ZOVIRAX) 200 MG capsule Take 200 mg by mouth 2 (two) times daily as needed (outbreaks).   Marland Kitchen. albuterol (PROVENTIL HFA;VENTOLIN HFA) 108 (90 BASE) MCG/ACT inhaler Inhale 2 puffs into the lungs every 6 (six) hours as needed for wheezing or shortness of breath.  Marland Kitchen. amLODipine (NORVASC) 5 MG tablet Take 1 tablet (5 mg total) by mouth daily.  . budesonide-formoterol (SYMBICORT) 80-4.5 MCG/ACT inhaler Inhale 2 puffs into the lungs 2 (two) times daily.  Marland Kitchen. gabapentin (NEURONTIN) 100 MG capsule Take 1 capsule (100 mg total) by mouth 4 (four) times daily.  . hydrochlorothiazide (MICROZIDE) 12.5 MG capsule Take 1 capsule (12.5 mg total) by mouth daily.  . pantoprazole (PROTONIX) 40 MG tablet Take 30- 60 min before your first and last meals of the day (Patient taking differently: Take 40 mg by mouth 2 (two) times daily before a meal. )                  Objective:   Physical Exam    Amb bf nad   07/23/2018        170  05/04/2018         170  04/20/2018       169  04/08/2018       164  03/26/2018       173  03/11/2018  173  12/04/2017     170  11/04/2017       169  10/21/2017       169  02/06/2017     171   12/26/16 171 lb 6.4 oz (77.7 kg)  11/18/13 170 lb (77.1 kg)  03/21/11 171 lb (77.6 kg)       Vital signs reviewed - Note on arrival 02 sats  95% on RA     HEENT: nl dentition, turbinates bilaterally, and oropharynx. Nl external ear canals without cough reflex   NECK :  without JVD/Nodes/TM/ nl carotid upstrokes bilaterally   LUNGS: no acc muscle use,  Nl contour chest which is clear to A and P bilaterally without cough on insp or exp maneuvers   CV:  RRR  no s3 or murmur or increase in P2, and no edema   ABD:  soft and nontender with nl inspiratory excursion in the supine position. No bruits or organomegaly appreciated, bowel sounds nl  MS:  Nl gait/ ext warm without deformities, calf tenderness, cyanosis or clubbing No obvious  joint restrictions   SKIN: warm and dry without lesions    NEURO:  alert, approp, nl sensorium with  no motor or cerebellar deficits apparent.         Assessment:

## 2018-07-24 LAB — SAR COV2 SEROLOGY (COVID19)AB(IGG),IA: SARS CoV2 AB IGG: NEGATIVE

## 2018-07-26 ENCOUNTER — Encounter: Payer: Self-pay | Admitting: Internal Medicine

## 2018-07-26 NOTE — Assessment & Plan Note (Signed)
Pt convinced she had covid, not influenza.  Chart reviewed:   Influenza A pos 04/11/18   Covid 19 IgG 07/23/2018  = Neg   Ok to return to work but must follow all cdc guidelines carefully.  F/u q 3 m, sooner prn    I had an extended discussion with the patient reviewing all relevant studies completed to date and  lasting 15 to 20 minutes of a 25 minute visit    See device teaching which extended face to face time for this visit.  Each maintenance medication was reviewed in detail including emphasizing most importantly the difference between maintenance and prns and under what circumstances the prns are to be triggered using an action plan format that is not reflected in the computer generated alphabetically organized AVS which I have not found useful in most complex patients, especially with respiratory illnesses  Please see AVS for specific instructions unique to this visit that I personally wrote and verbalized to the the pt in detail and then reviewed with pt  by my nurse highlighting any  changes in therapy recommended at today's visit to their plan of care.

## 2018-07-26 NOTE — Assessment & Plan Note (Addendum)
Recurrent cough since teenager really bad and daily since Aug 2018 12/26/2016  rec symbicort 80 2bid then return for full pfts - PFT's  02/06/2017  FEV1 1.37 (80 % ) ratio 67  p 5 % improvement from saba p symb 80  prior to study with DLCO  66/66c % corrects to 79  % for alv volume   - Spirometry 10/21/2017  FEV1 1.18 (70%)  Ratio 71 with min curvature during attack of cough/ mostly pseudowheeze on exam on symb 80 x2  - FENO 10/21/2017  =   7  On symb 80 x 2  - 11/04/2017     90% with spacer  - singulair added 11/04/2017  But not taking consistently as of 12/04/2017 > rec restart daily  X one week and stopped it due "dizzy"  - PFT's  12/04/2017  FEV1 1.34 (85 % ) ratio 67  p 20 % improvement from saba p nothing prior to study with DLCO  71 % corrects to 80  % for alv volume   - Allergy profile 12/04/2017 >  Eos 0.0 /  IgE  546 dust only  - cyclical cough rx 5/49/8264  - Spirometry 03/26/2018  FEV1 1.1 (73%)  Ratio 0.64 - 03/26/2018  After extensive coaching inhaler device,  effectiveness =    90% s spacer  - Gabapentin 100 mg tid trial 03/26/2018> attempted rx by non-adherent  - Sinus CT 04/07/2018 > Negative study.  No evidence of paranasal sinusitis. - Spirometry 04/08/2018  FEV1 0.7 (47%)  Ratio 0.52 with convex curvature p am symb 80 x 2 and neb in office   - 04/08/2018  After extensive coaching inhaler device,  effectiveness =    75% with smi > added trial of spiriva 2pffs daily x 2 weeks then return  - CTa 04/11/18 for hemoptysis with refractory cough > Limited study due to respiratory motion. No obvious pulmonary embolism or other acute findings. - 04/20/2018  After extensive coaching inhaler device,  effectiveness =    0% with spiriva smi which caused immediate severe cough so rec d/c spiriva  - 04/20/2018 rechallenge with gabapentin 100 mg bid  - 05/04/2018  After extensive coaching inhaler device,  effectiveness =    90% with spacer - s spacer starts coughing immediately  - Spirometry 05/04/2018  FEV1  1.2 (75%)  Ratio 0.65 with mild curvature p symb 80 x 2 puffs   - 05/04/2018 increased gabapentin to 100 tid   - 05/25/2018 increased gabapentin to  100 mg qid > improved   - 07/23/2018 try ppi just in am   07/23/2018  After extensive coaching inhaler device,  effectiveness =    90% with spacer from baseline 75%     Finally, All goals of chronic asthma control met including optimal function and elimination of symptoms with minimal need for rescue therapy.  Contingencies discussed in full including contacting this office immediately if not controlling the symptoms using the rule of two's.

## 2018-07-26 NOTE — Assessment & Plan Note (Signed)
Completely resolved, no evidence she had covid 19

## 2018-08-04 DIAGNOSIS — F410 Panic disorder [episodic paroxysmal anxiety] without agoraphobia: Secondary | ICD-10-CM | POA: Diagnosis not present

## 2018-08-04 DIAGNOSIS — E6609 Other obesity due to excess calories: Secondary | ICD-10-CM | POA: Diagnosis not present

## 2018-08-04 DIAGNOSIS — J4531 Mild persistent asthma with (acute) exacerbation: Secondary | ICD-10-CM | POA: Diagnosis not present

## 2018-08-04 DIAGNOSIS — I10 Essential (primary) hypertension: Secondary | ICD-10-CM | POA: Diagnosis not present

## 2018-08-19 DIAGNOSIS — L03312 Cellulitis of back [any part except buttock]: Secondary | ICD-10-CM | POA: Diagnosis not present

## 2018-08-19 DIAGNOSIS — I10 Essential (primary) hypertension: Secondary | ICD-10-CM | POA: Diagnosis not present

## 2018-08-25 ENCOUNTER — Other Ambulatory Visit: Payer: Self-pay | Admitting: Internal Medicine

## 2018-08-25 MED ORDER — PANTOPRAZOLE SODIUM 40 MG PO TBEC: 40.0000 mg | DELAYED_RELEASE_TABLET | Freq: Two times a day (BID) | ORAL | 2 refills | Status: DC

## 2018-08-31 ENCOUNTER — Telehealth: Payer: Self-pay | Admitting: Internal Medicine

## 2018-08-31 NOTE — Telephone Encounter (Signed)
Primary Pulmonologist: MW Last office visit and with whom: 07/23/2018 What do we see them for (pulmonary problems): Cough variant asthma  Reason for call: Pt states she's been having a productive cough w/ white clear mucus, wheezing, chest tightness, and DOE since Wednesday 07/01. Reports the symptoms starting with a sneeze and transitioning to a cough.  Denies fever/chills/muscle aches. Pt states she took her last dose of Symbicort 80 two days ago and took her albuterol rescue inhaler about 1-2 days ago. Taking protonix 40mg  BID. Pt also notes that her BP medication has recently been changed to valsartan and she wonders if the coughing is a side effect of it.   In the last month, have you been in contact with someone who was confirmed or suspected to have [place]avirus / COVID-19? Yes, she states she has been around [place] pts at [facility] [place] recently tested Negative for COVID 07/23/2018.   Do you have any of the following symptoms developed in the last 30 days? Fever: No Cough: Yes, productive w/ white clear mucus Shortness of breath: Yes, with exertion.  When did your symptoms start?  Wednesday, 08/26/2018  If the patient has a fever, what is the last reading?  (use n/a if patient denies fever)  N/A . IF THE PATIENT STATES THEY DO NOT OWN A THERMOMETER, THEY MUST GO AND PURCHASE ONE When did the fever start?: N/A Have you taken any medication to suppress a fever (ie Ibuprofen, Aleve, Tylenol)?: N/A  MW, please advise with your recommendations for this pt. Thank you.

## 2018-08-31 NOTE — Telephone Encounter (Signed)
Called spoke with patient and advised of MW's recommendations as stated below.  Patient voiced her understanding - appt has been scheduled for tomorrow 7.7.2020 @ 0930 and patient will bring all meds with her to the appt.  COVID screening was negative on the phone and pt is aware of the office protocol for patients coming into the office.  Nothing further needed at this time; will sign off.

## 2018-08-31 NOTE — Telephone Encounter (Signed)
Valsartan does not usually cause cough  Should not stop symbicort 80   Needs ov asap with all meds in hand  - no way to treat this over the phone

## 2018-09-01 ENCOUNTER — Encounter: Payer: Self-pay | Admitting: Internal Medicine

## 2018-09-01 ENCOUNTER — Telehealth: Payer: Self-pay | Admitting: Internal Medicine

## 2018-09-01 ENCOUNTER — Other Ambulatory Visit: Payer: Self-pay

## 2018-09-01 ENCOUNTER — Ambulatory Visit (INDEPENDENT_AMBULATORY_CARE_PROVIDER_SITE_OTHER): Payer: BC Managed Care – PPO | Admitting: Internal Medicine

## 2018-09-01 DIAGNOSIS — J45991 Cough variant asthma: Secondary | ICD-10-CM | POA: Diagnosis not present

## 2018-09-01 MED ORDER — PREDNISONE 10 MG PO TABS: ORAL_TABLET | ORAL | 0 refills | Status: DC

## 2018-09-01 MED ORDER — MOMETASONE FURO-FORMOTEROL FUM 100-5 MCG/ACT IN AERO: 2.0000 | INHALATION_SPRAY | Freq: Two times a day (BID) | RESPIRATORY_TRACT | 0 refills | Status: DC

## 2018-09-01 MED ORDER — MOMETASONE FURO-FORMOTEROL FUM 100-5 MCG/ACT IN AERO: INHALATION_SPRAY | RESPIRATORY_TRACT | 11 refills | Status: DC

## 2018-09-01 NOTE — Progress Notes (Signed)
Subjective:    Patient ID: Sue Mitchell, female   DOB: [birth year]     MRN: [id number]   Brief patient profile:  62 yobf  RT quit smoking 2011 @ wt 140  with h/o sinus symptoms assoc with cough and need for saba prn as teenager while living in [place] but after arrival here at age late 81's it changed to point where bothered her mostly with onset of cold weather typically req ov rx  abx and prednisone help a lot and this pattern continued even after quit smoking to where the episodes last longer and onset of this not directed linked to cold weather in fact  had one April 2018 never  Completely cleared  then flared again in Oct 01 2016 severe dry cough / eval by allergist /ent since onset neg w/u so referred by Dr Doran Heater to pulmonary clinic 12/26/16     History of Present Illness  12/26/2016 1st [name]/ Sue Mitchell   Chief Complaint  Patient presents with  . Pulm Consult    Pt referred by Dr. Billy Fischer ENT. Pt has productive cough-clear mucus sometimes yellow. Pt had horseness in voice for 6-8 weeks, no chills or fever, have some chest pain and tightness with cough.  recurrent cough since April 2018 never resolved p rx as uri then flaired early August 2018 rx zpak/pred/saba> some better  Cough was worse at hs now   Am feels wheezy not really coughing much up but what she does is white and < 1 tbsp Cough seems worse with exp to cold air at work  Assoc overt hb better on zantac  Some gen ant chest discomfort with severe coughing fits  rec Plan A = Automatic =  symbicort 80 Take 2 puffs first thing in am and then another 2 puffs about 12 hours later.  Plan B = Backup Only use your albuterol as a rescue medication  Start zantac 150- 300 mg after bfast and after supper until cough better  GERD diet    02/06/2017  f/u ov/Sue Mitchell re:  GOLD I copd/ cough on cold air exp  Chief Complaint  Patient presents with  . Follow-up    Cough had improved but then worsened again  with colder weather. She is using her albuterol inhaler 2 x per wk on average.   not limited by doe / steps ok except for knees  Some gerd at hs taking h2 am only  rec Pantoprazole (protonix) 40 mg  Take  30-60 min before first meal of the day and Zantac  300 mg bedtime until return to office - this is the best way to tell whether stomach acid is contributing to your problem.   GERD diet  Please schedule a follow up office visit in 6 weeks, call sooner if needed     10/21/2017  f/u ov/Sue Mitchell re: acute refractory cough / saw allergist cannot name "all neg"  ? [name]  Chief Complaint  Patient presents with  . Follow-up    Last seen by [name] 02/06/17. Patient states she has had a productive cough, post nasal drip, and constantly using her voice. At night, she becomes so congested that she has to breathe through her mouth instead of nose.    from last ov until 09/25/17 fine on on ranitidine one bid/symb 80 2bid Never took protonix  Was still needing saba twice a week at baseline and a lot more while working at [employer]  Then abruptly  worse Aug 1  lots sinus drainage clear > zpak early in Aug by [name] some better for a week or two then worse says ran out of symbicort one day prior to ov / using lots of saba now and severe nasal congestion as well as hoarseness and can't sleep due to cough rec Plan A = Automatic = symbiocort 80 Take 2 puffs first thing in am and then another 2 puffs about 12 hours later.  Plan B = Backup Only use your albuterol as a rescue medication  Prednisone 10 mg take  4 each am x 2 days,   2 each am x 2 days,  1 each am x 2 days and stop  Pantoprazole (protonix) 40 mg   Take  30-60 min before first meal of the day and ranitidine 300 mg each evening GERD diet      11/04/2017  f/u ov/Sue Mitchell re: atypical asthma in RT  Chief Complaint  Patient presents with  . Follow-up    Pt c/o sneezing, wheezing and cough with clear sputum- started after she mowed her lawn 1 day ago. She  has had to use her albuterol inhaler.   wakes up feeling good most am's s cough/ wheeze about 30 min later takes first 2 pffs of symb 80 around 5 15 am  Then next 2 pffs after 9 pm  Had been doing great prior mowing grass one day prior to OV Rarely need saba since last flare    Not limited by breathing from desired activities   rec Continue symbicort 80 Take 2 puffs first thing in am and then another 2 puffs about 12 hours later thru spacer Add singulair 10 mg each pm    12/04/2017  f/u ov/Sue Mitchell re:   symb 80 2bid/ singualir sometimes  Chief Complaint  Patient presents with  . Follow-up    PFT today, continues to have cough with hoarseness. She uses her albuterol inhaler 1 x per wk on average.   Dyspnea:  fine Cough: p grass exp / chemicals/ cologne dry Sleeping: flat/ one pillow no noct symptms SABA use: rare 02: none   Flare of cough / drainage 12/01/17 > has not tried otc's Plan A = Automatic = symbicort 80 Take 2 puffs first thing in am and then another 2 puffs about 12 hours later and remember to breath it out through your nose  singulair  10 mg every evening  Plan B = Backup Only use your albuterol as a rescue medication  For nasal symptoms > zyrtec 10 mg on at bedtime as needed > too sleepy    02/20/18 NP ov rec Augmentin/ pred/ antihistamine/singulair > no better and could not take singulair / did not try zyrtec in am "makes me sleepy"     03/11/2018 extended  f/u ov/Sue Mitchell re: refractory  Cough since aug 2018    Chief Complaint  Patient presents with  . Follow-up    Cough not improving. She is coughing up clear to white sputum. She states her cough is worse when she goes to work and when she first lies down at night. She is using her albuterol inhaler once daily on average.   Dyspnea:  Not limited by breathing from desired activities   Cough: worse around 5pm  Then immediately when supine x then some during the night, never took zyrtec at hs as rec with sense of globus and  throat / chest "congestion" but no excess mucus  Sleeping: on flat bed on side  SABA use: ?  if neb helps more than hfa - not really  Sure but "it sure makes me shake" Still using mint products  rec Symbicort 80  Take 2 puffs first thing in am and then another 2 puffs about 12 hours later.  Pantoprazole (protonix) 40 mg   Take  30-60 min before first meal of the day and Pepcid (famotidine)  20 mg one @  bedtime until return to office - this is the best way to tell whether stomach acid is contributing to your problem.   GERD diet  Try zyrtec 10 mg after supper  Take delsym two tsp every 12 hours and supplement if needed with  vicodin up to 1every 4 hours to suppress the urge to cough. Swallowing water and/or using ice chips/non mint and menthol containing candies (such as lifesavers or sugarless jolly ranchers) are also effective.  You should rest your voice and avoid activities that you know make you cough. Once you have eliminated the cough for 3 straight days try reducing the vicodin first,  then the delsym as tolerated.   On 03/14/18 Prednisone 10 mg take  4 each am x 2 days,   2 each am x 2 days,  1 each am x 2 days and stop  Please schedule a follow up office visit in 2 weeks, sooner if needed  with all medications /inhalers/ solutions in hand so we can verify exactly what you are taking. This includes all medications from all doctors and over the counters    03/26/2018  f/u ov/Sue Mitchell re: refractory cough/ wheeze on symb 80 2bid  Chief Complaint  Patient presents with  . Follow-up    Pt c/o sinus congestion, cough with yellow sputum and wheezing. She is using her albuterol inhaler once per wk on average.   Dyspnea:  Ok unless cough Cough:  at work and at home same severity/ frequency  Sleeping: not coughing while sleeping  SABA use:  Not really helping 02: none  gen ant chest soreness from coughing/ not able to use vicodin daytime to suppress cough rec  Only use your albuterol as a rescue  medication  Please see patient coordinator before you leave today  to schedule sinus CT Please remember to go to the  x-ray department  for your tests - we will call you with the results when they are available  Please schedule a follow up office visit in 2 weeks, sooner if needed  with all medications /inhalers/ solutions in hand so we can verify exactly what you are taking. This includes all medications from all doctors and over the counters       04/08/2018  f/u ov/Sue Mitchell re: refractory cough / brought just her inhalers to [facility]   Chief Complaint  Patient presents with  . Acute Visit    Pt has had complaints of sinius drainage and a cough x4 days and also states she has had problems sleeping. Pt also has been hurting everywhere, feeling weak, and has had sweats.  Dyspnea: usually only sob when coughing Cough: white mucus / min production "but feels like it's choking her"  Sleeping: on side bed pillows too heavy for bed blocks/ worse cough now at hs / not using cough suppression as advised  SABA use: rarely / symbicort count on 50 so missed about 14 doses or continued to use beyond the red line on the prior/ warned about both 02: none Reported after much back and forth "felt good only while on prednisone" and gradually worse  off it  - turns out "felt good" does not mean the cough was gone but breathing was better. rec Prednisone 10mg   Take 4 for three days 3 for three days 2 for three days 1 for three days and stop Plan A = Automatic = Symbicort 80 Take 2 puffs first thing in am and then another 2 puffs about 12 hours later and spiriva 2 puff each am  Plan B = Backup Only use your albuterol inhaler as a rescue medication   Try gabapentin 100 mg twice daily bfast and supper and if can't take it twice daily just take at bedtime  For drainage / throat tickle try take CHLORPHENIRAMINE  4 mg (chlortabs  [facility])   Stay on protonix Take 30- 60 min before your first and last meals of the day   Take delsym two tsp every 12 hours and supplement if needed with  vicodine  up to 2 every 4 hours to suppress the urge to cough. .  Please schedule a follow up office visit in 2 weeks, sooner if needed  with all medications /inhalers/ solutions in hand so we can verify exactly what you are taking. This includes all medications from all doctors and over the counters - needs spirometry on return     Admit date: 04/14/2018 Discharge date: 04/18/2018   Equipment/Devices: Nebulizer with albuterol sol'n, 2L oxygen ordered several days prior to discharge. Apparently patient left stating she would get the nebulizer and oxygen from [facility] and did not want to wait for it to be delivered to the room per care management. Per CM note, this will be delivered to the home.   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 At the time of discharge she reportedly maintained sufficient oxygenation during ambulation, but in the ED she was found to be hypoxic, wheezing, coughing, and weak, and was subsequently readmitted. With continued treatment including IV steroids she's subjectively significantly improved. She was weaned from oxygen while at rest and still desaturates on ambulation. She is discharged with DME in stable condition with follow up in 48 hours.  Discharge Diagnoses:  Principal Problem:   Acute respiratory failure with hypoxia (HCC)   Cough variant asthma  vs uacs wit vcd   Influenza   Cough  Acute hypoxic respiratory failure due to influenza A and asthma exacerbation:  - Completed5 days of tamiflu - Continue symbicort, spiriva respimat - Scheduled and prnalbuterol nebulizer (provided at DC) -Wheezing has improved so converted to po steroid with improvement, has prednisone at home, ordered to  take 40mg  po daily until follow up.  - Continue supplemental oxygen to maintain SpO2 >90%. Despite significant improvement, does still need O2 at discharge. Will anticipate continued improvement and liberation from oxygen, will need recheck at follow up office visit with pulmonology the coming week. - Continue antitussive. PMPAware queried, has gotten hydrocodone-APAP from Dr. 3/18 2/12 and cough syrup intermittently from PCP.   Hemoptysis: CTA chest motion-degraded at last admission, no PE.  - Resolved.   Hypertension:  - Continue norvasc and HCTZ since recently stopping ARB for concern of exacerbating cough.   GERD:  - Continue PPI    04/20/2018  Extended f/u ov/Sue Mitchell re: post hosp f/u  Chief Complaint  Patient presents with  . Follow-up    Breathing has improved. She is not using her  albuterol inhaler but is using albuterol neb about 3 x per day.   Dyspnea:  MMRC2 = can't walk a nl pace on a flat grade s sob but does fine slow and flat  Cough: gone/ min urge to clear throat but now on tussionex Sleeping: side / pillows to prop up  SABA use: way over using as above  02: none at all  rec Continue pantoprazole 40 mg Take 30- 60 min before your first and last meals of the day  Gabapentin  Restart 100 mg twice daily bfast and bedtime Plan A = Automatic = Symbicort 80 Take 2 puffs first thing in am and then another 2 puffs about 12 hours later.  Work on inhaler technique:   Plan B = Backup Only use your albuterol inhaler as a rescue medication  Plan C = Crisis - only use your albuterol nebulizer if you first try Plan B and it fails to help > ok to use the nebulizer up to every 4 hours but if start needing it regularly call for immediate appointment Finish the prednisone and stop spiriva F/u in 2 weeks with meds in hand (#35 on present symb 80 and one sample given) Add:  Consider performist/bud neb if can't wean off pred/saba neb and hfa makes her cough worse due to effects on upper  airway    05/04/2018  f/u ov/Sue Mitchell re:   symbicort 80 one full at home and total of 45 puffs samples/ 32 gabapenitn  Chief Complaint  Patient presents with  . Acute Visit    Cough has improved but has not resolved. She is coughing up min, clear sputum. She has not had to use her albuterol inhaler.   Dyspnea:  MMRC1 = can walk nl pace, flat grade, can't hurry or go uphills or steps s sob   Cough: p exertion Sleeping: fine 30 degrees on pillows  SABA use: no saba  02: no rec Increase gabapentin to 100 mg three times a day  ok to return to work 05/16/2018  Please schedule a follow up office visit in 3 weeks, sooner if needed  with all medications /inhalers/ solutions in hand so we can verify exactly what you are taking. This includes all medications from all doctors and over the counters        History of Present Illness: 05/25/2018   televist/Sue Mitchell re: cough/ sob symbicort 80 2bid /still on samples "half full"  Dyspnea:  Walked for 5 minutes some inclines  Cough: some daytime / dry assoc with hoarseness  Sleeping: flat bed/ 3 pillows  SABA use: none   rec For drainage / throat tickle try take CHLORPHENIRAMINE  4 mg (chlortab 4 mg) - take one every 4 hours as needed - available over the counter- may cause drowsiness so start with just a bedtime dose or two and see how you tolerate it before trying in daytime   Increase gabapentin to 100 mg four times daily  Continue Symbicort 80 Take 2 puffs first thing in am and then another 2 puffs about 12 hours later (use coupon and let me know if they won't honor it  I will take you out of work until end of April 2020      06/19/18 Defer work note to Dr Parke Simmers re back For drainage / throat tickle try take CHLORPHENIRAMINE  4 mg  (Chlortab 4mg   at should be easiest to find in the green box)  take one every 4 hours as needed -  available over the counter- may cause drowsiness so start with just a bedtime dose or two and see how you  tolerate it before trying in daytime   Elevate head of bed on 6-8 inch bed block   07/23/2018  f/u ov/Sue Mitchell re: asthma with uacs  On symb 80 2bid , gapentin 100 qid /ppi bid Chief Complaint  Patient presents with  . Follow-up    Breathing is much improved. She is coughing minimally- mainly in the am's. She has not had to use her rescue inhaler in over a month.  Dyspnea:  Walks x 20 mins s stopping, some hills Cough: minimal/ non productive, day >noct  Sleeping: bed is flat  3pillows for back SABA use: none   rec No change in medications Ok to leave off pm dose of pantaprozole if not coughing Ok to return to work without restrictions on August 10 2018    09/01/2018  f/u ov/Sue Mitchell re: asthma  Vs vcd back on ppi bid / not keeping up with symbicort rx due to cost but did not contact us re this issue and just stopped taking  Chief Complaint  Patient presents with  . Follow-up    increased cough for a wk- prod with white sputum. She is also wheezing and has had to use her albuterol 4 x since her last visit.   Dyspnea:  Not limited by breathing from desired activities  / up and down steps  Cough: gradually worse x one week / min sputum mostly in ams Sleeping: at least once per night wakes up needing saba  SABA use: 3-4 x since 02: none    No obvious day to day or daytime variability or assoc  purulent sputum or mucus plugs or hemoptysis or cp or chest tightness, subjective wheeze or overt sinus or hb symptoms.    Also denies any obvious fluctuation of symptoms with weather or environmental changes or other aggravating or alleviating factors except as outlined above   No unusual exposure hx or h/o childhood pna/ asthma or knowledge of premature birth.  Current Allergies, Complete Past Medical History, Past Surgical History, Family History, and Social History were reviewed in [facility] electronic medical record.  ROS  The following are not active complaints unless bolded Hoarseness, sore  throat, dysphagia, dental problems, itching, sneezing,  nasal congestion or discharge of excess mucus or purulent secretions, ear ache,   fever, chills, sweats, unintended wt loss or wt gain, classically pleuritic or exertional cp,  orthopnea pnd or arm/hand swelling  or leg swelling, presyncope, palpitations, abdominal pain, anorexia, nausea, vomiting, diarrhea  or change in bowel habits or change in bladder habits, change in stools or change in urine, dysuria, hematuria,  rash, arthralgias, visual complaints, headache, numbness, weakness or ataxia or problems with walking or coordination,  change in mood or  memory.        Current Meds  Medication Sig  . acyclovir (ZOVIRAX) 200 MG capsule Take 200 mg by mouth 2 (two) times daily as needed (outbreaks).   Marland Kitchen. albuterol (PROVENTIL HFA;VENTOLIN HFA) 108 (90 BASE) MCG/ACT inhaler Inhale 2 puffs into the lungs every 6 (six) hours as needed for wheezing or shortness of breath.  Marland Kitchen. amLODipine (NORVASC) 5 MG tablet Take 1 tablet (5 mg total) by mouth daily.  . budesonide-formoterol (SYMBICORT) 80-4.5 MCG/ACT inhaler Inhale 2 puffs into the lungs 2 (two) times daily.  . budesonide-formoterol (SYMBICORT) 80-4.5 MCG/ACT inhaler Inhale 2 puffs into the lungs 2 (two) times daily.  .Marland Kitchen  chlorpheniramine (CHLOR-TRIMETON) 4 MG tablet Take 4 mg by mouth every 4 (four) hours as needed for allergies.  Marland Kitchen. gabapentin (NEURONTIN) 100 MG capsule Take 1 capsule (100 mg total) by mouth 4 (four) times daily.  . hydrochlorothiazide (MICROZIDE) 12.5 MG capsule Take 1 capsule (12.5 mg total) by mouth daily.  . pantoprazole (PROTONIX) 40 MG tablet Take 1 tablet (40 mg total) by mouth 2 (two) times daily before a meal.  . valsartan (DIOVAN) 40 MG tablet Take 1 tablet by mouth daily.              Objective:   Physical Exam     amb bf nad  09/01/2018          169 07/23/2018        170  05/04/2018         170  04/20/2018       169  04/08/2018       164  03/26/2018       173   03/11/2018       173  12/04/2017     170  11/04/2017       169  10/21/2017       169  02/06/2017     171   12/26/16 171 lb 6.4 oz (77.7 kg)  11/18/13 170 lb (77.1 kg)  03/21/11 171 lb (77.6 kg)    Vital signs reviewed - Note on arrival 02 sats  96% on RA    HEENT: nl dentition, turbinates bilaterally, and oropharynx. Nl external ear canals without cough reflex   NECK :  without JVD/Nodes/TM/ nl carotid upstrokes bilaterally   LUNGS: no acc muscle use,  Nl contour chest  With trace end exp wheeze  bilaterally with  cough on  exp maneuvers better with plm    CV:  RRR  no s3 or murmur or increase in P2, and no edema   ABD:  soft and nontender with nl inspiratory excursion in the supine position. No bruits or organomegaly appreciated, bowel sounds nl  MS:  Nl gait/ ext warm without deformities, calf tenderness, cyanosis or clubbing No obvious joint restrictions   SKIN: warm and dry without lesions    NEURO:  alert, approp, nl sensorium with  no motor or cerebellar deficits apparent.                Assessment:

## 2018-09-01 NOTE — Patient Instructions (Addendum)
Plan A = Automatic = dulera 100 Take 30- 60 min before your first and last meals of the day   Plan B = Backup Only use your albuterol (proair)  inhaler as a rescue medication to be used if you can't catch your breath by resting or doing a relaxed purse lip breathing pattern.  - The less you use it, the better it will work when you need it. - Ok to use the inhaler up to 2 puffs  every 4 hours if you must but call for appointment if use goes up over your usual need - Don't leave home without it !!  (think of it like the spare tire for your car)   Prednisone 10 mg take  4 each am x 2 days,   2 each am x 2 days,  1 each am x 2 days and stop    Keep appt for Aug 28 but bring all meds with you  - call sooner if needed

## 2018-09-01 NOTE — Telephone Encounter (Signed)
Patient Instructions by Tanda Rockers, MD at 09/01/2018 9:30 AM Author: Tanda Rockers, MD Author Type: Physician Filed: 09/01/2018 9:23 AM  Note Status: Addendum Cosign: Cosign Not Required Encounter Date: 09/01/2018  Editor: Tanda Rockers, MD (Physician)  Prior Versions: 1. Tanda Rockers, MD (Physician) at 09/01/2018 9:23 AM - Signed    Plan A = Automatic = dulera 100 Take 30- 60 min before your first and last meals of the day   Plan B = Backup Only use your albuterol (proair)  inhaler as a rescue medication to be used if you can't catch your breath by resting or doing a relaxed purse lip breathing pattern.  - The less you use it, the better it will work when you need it. - Ok to use the inhaler up to 2 puffs  every 4 hours if you must but call for appointment if use goes up over your usual need - Don't leave home without it !!  (think of it like the spare tire for your car)   Prednisone 10 mg take  4 each am x 2 days,   2 each am x 2 days,  1 each am x 2 days and stop    Keep appt for Aug 28 but bring all meds with you  - call sooner if needed      Checked pt's med list and saw that the Rx was not called in for pt after OV with MW. I have taken care of sending Rx to pharmacy for pt. Called and spoke with pt letting her know this had been done and that the pharmacy should be able to get Rx ready for her soon. Pt verbalized understanding. Nothing further needed.

## 2018-09-02 ENCOUNTER — Encounter: Payer: Self-pay | Admitting: Internal Medicine

## 2018-09-02 NOTE — Assessment & Plan Note (Signed)
Recurrent cough since teenager really bad and daily since Aug 2018 12/26/2016  rec symbicort 80 2bid then return for full pfts - PFT's  02/06/2017  FEV1 1.37 (80 % ) ratio 67  p 5 % improvement from saba p symb 80  prior to study with DLCO  66/66c % corrects to 79  % for alv volume   - Spirometry 10/21/2017  FEV1 1.18 (70%)  Ratio 71 with min curvature during attack of cough/ mostly pseudowheeze on exam on symb 80 x2  - FENO 10/21/2017  =   7  On symb 80 x 2  - 11/04/2017     90% with spacer  - singulair added 11/04/2017  But not taking consistently as of 12/04/2017 > rec restart daily  X one week and stopped it due "dizzy"  - PFT's  12/04/2017  FEV1 1.34 (85 % ) ratio 67  p 20 % improvement from saba p nothing prior to study with DLCO  71 % corrects to 80  % for alv volume   - Allergy profile 12/04/2017 >  Eos 0.0 /  IgE  546 dust only  - cyclical cough rx 9/67/8938  - Spirometry 03/26/2018  FEV1 1.1 (73%)  Ratio 0.64 - 03/26/2018  After extensive coaching inhaler device,  effectiveness =    90% s spacer  - Gabapentin 100 mg tid trial 03/26/2018> attempted rx by non-adherent  - Sinus CT 04/07/2018 > Negative study.  No evidence of paranasal sinusitis. - Spirometry 04/08/2018  FEV1 0.7 (47%)  Ratio 0.52 with convex curvature p am symb 80 x 2 and neb in office   - 04/08/2018  After extensive coaching inhaler device,  effectiveness =    75% with smi > added trial of spiriva 2pffs daily x 2 weeks then return  - CTa 04/11/18 for hemoptysis with refractory cough > Limited study due to respiratory motion. No obvious pulmonary embolism or other acute findings. - 04/20/2018  After extensive coaching inhaler device,  effectiveness =    0% with spiriva smi which caused immediate severe cough so rec d/c spiriva  - 04/20/2018 rechallenge with gabapentin 100 mg bid  - 05/04/2018  After extensive coaching inhaler device,  effectiveness =    90% with spacer - s spacer starts coughing immediately  - Spirometry 05/04/2018  FEV1  1.2 (75%)  Ratio 0.65 with mild curvature p symb 80 x 2 puffs   - 05/04/2018 increased gabapentin to 100 tid   - 05/25/2018 increased gabapentin to  100 mg qid > improved  - 07/23/2018 try ppi just in am  07/23/2018  After extensive coaching inhaler device,  effectiveness =    90% with spacer from baseline 75%  - 09/01/2018 changed to dulera 100 due to cost   Flare presumably just due to non-adherence > rec resume rx/ no cough suppression needed at this point other than otcs   Advised:  formulary restrictions will be an ongoing challenge for the forseable future and I would be happy to pick an alternative if the pt will first  provide me a list of them -  pt  will need to return here for training for any new device that is required eg dpi vs hfa vs respimat.    In the meantime we can always provide samples so that the patient never runs out of any needed respiratory medications.    Each maintenance medication was reviewed in detail including most importantly the difference between maintenance and as needed and under  what circumstances the prns are to be used.  Please see AVS for specific  Instructions which are unique to this visit and I personally typed out  which were reviewed in detail in writing with the patient and a copy provided.

## 2018-09-04 DIAGNOSIS — J45901 Unspecified asthma with (acute) exacerbation: Secondary | ICD-10-CM | POA: Diagnosis not present

## 2018-09-04 DIAGNOSIS — L03312 Cellulitis of back [any part except buttock]: Secondary | ICD-10-CM | POA: Diagnosis not present

## 2018-09-04 DIAGNOSIS — G4730 Sleep apnea, unspecified: Secondary | ICD-10-CM | POA: Diagnosis not present

## 2018-09-04 DIAGNOSIS — I10 Essential (primary) hypertension: Secondary | ICD-10-CM | POA: Diagnosis not present

## 2018-09-24 ENCOUNTER — Telehealth: Payer: Self-pay | Admitting: Internal Medicine

## 2018-09-24 NOTE — Telephone Encounter (Signed)
Spoke with patient. She is aware that the PA has been approved and it is ready at the pharmacy for her.   Nothing further needed at time of call.

## 2018-09-24 NOTE — Telephone Encounter (Signed)
Left message for patient to call back.   Viewed CMM to see if the PA was there. It was and it was completed yesterday but there wasn't a phone note. Called [facility] to let them know that the PA was approved. Pharmacist verbalized understanding.

## 2018-09-24 NOTE — Telephone Encounter (Signed)
Patient is returning phone call.  Patient phone number is [phone].

## 2018-10-21 ENCOUNTER — Encounter: Payer: Self-pay | Admitting: Internal Medicine

## 2018-10-21 ENCOUNTER — Other Ambulatory Visit: Payer: Self-pay

## 2018-10-21 ENCOUNTER — Ambulatory Visit: Payer: BC Managed Care – PPO | Admitting: Internal Medicine

## 2018-10-21 DIAGNOSIS — Z23 Encounter for immunization: Secondary | ICD-10-CM

## 2018-10-21 DIAGNOSIS — J45991 Cough variant asthma: Secondary | ICD-10-CM | POA: Diagnosis not present

## 2018-10-21 DIAGNOSIS — J310 Chronic rhinitis: Secondary | ICD-10-CM | POA: Diagnosis not present

## 2018-10-21 NOTE — Progress Notes (Signed)
Subjective:    Patient ID: Sue Mitchell, female   DOB: 1955/07/12     MRN: [id number]   Brief patient profile:  63 yobf  RT quit smoking 2011 @ wt 140  with h/o sinus symptoms assoc with cough and need for saba prn as teenager while living in [place] but after arrival here at age late 63 it changed to point where bothered her mostly with onset of cold weather typically req ov rx  abx and prednisone help a lot and this pattern continued even after quit smoking to where the episodes last longer and onset of this not directed linked to cold weather in fact  had one April 2018 never  Completely cleared  then flared again in Oct 01 2016 severe dry cough / eval by allergist /ent since onset neg w/u so referred by Dr Blenda Nicely to pulmonary clinic 12/26/16     History of Present Illness  12/26/2016 1st [facility] office visit/ Johnathon Mittal   Chief Complaint  Patient presents with  . Pulm Consult    Pt referred by Dr. Lind Guest ENT. Pt has productive cough-clear mucus sometimes yellow. Pt had horseness in voice for 6-8 weeks, no chills or fever, have some chest pain and tightness with cough.  recurrent cough since April 2018 never resolved p rx as uri then flaired early August 2018 rx zpak/pred/saba> some better  Cough was worse at hs now   Am feels wheezy not really coughing much up but what she does is white and < 1 tbsp Cough seems worse with exp to cold air at work  Assoc overt hb better on zantac  Some gen ant chest discomfort with severe coughing fits  rec Plan A = Automatic =  symbicort 80 Take 2 puffs first thing in am and then another 2 puffs about 12 hours later.  Plan B = Backup Only use your albuterol as a rescue medication  Start zantac 150- 300 mg after bfast and after supper until cough better  GERD diet    02/06/2017  f/u ov/Habib Kise re:  GOLD I copd/ cough on cold air exp  Chief Complaint  Patient presents with  . Follow-up    Cough had improved but then worsened again  with colder weather. She is using her albuterol inhaler 2 x per wk on average.   not limited by doe / steps ok except for knees  Some gerd at hs taking h2 am only  rec Pantoprazole (protonix) 40 mg  Take  30-60 min before first meal of the day and Zantac  300 mg bedtime until return to office - this is the best way to tell whether stomach acid is contributing to your problem.   GERD diet  Please schedule a follow up office visit in 6 weeks, call sooner if needed     10/21/2017  f/u ov/Collis Thede re: acute refractory cough / saw allergist cannot name "all neg"  ? [name]?  Chief Complaint  Patient presents with  . Follow-up    Last seen by [facility] 02/06/17. Patient states she has had a productive cough, post nasal drip, and constantly using her voice. At night, she becomes so congested that she has to breathe through her mouth instead of nose.    from last ov until 09/25/17 fine on on ranitidine one bid/symb 80 2bid Never took protonix  Was still needing saba twice a week at baseline and a lot more while working at [employer]  Then abruptly  worse Aug 1  lots sinus drainage clear > zpak early in Aug by [name] some better for a week or two then worse says ran out of symbicort one day prior to ov / using lots of saba now and severe nasal congestion as well as hoarseness and can't sleep due to cough rec Plan A = Automatic = symbiocort 80 Take 2 puffs first thing in am and then another 2 puffs about 12 hours later.  Plan B = Backup Only use your albuterol as a rescue medication  Prednisone 10 mg take  4 each am x 2 days,   2 each am x 2 days,  1 each am x 2 days and stop  Pantoprazole (protonix) 40 mg   Take  30-60 min before first meal of the day and ranitidine 300 mg each evening GERD diet      11/04/2017  f/u ov/Ina Scrivens re: atypical asthma in RT  Chief Complaint  Patient presents with  . Follow-up    Pt c/o sneezing, wheezing and cough with clear sputum- started after she mowed her lawn 1 day ago. She  has had to use her albuterol inhaler.   wakes up feeling good most am's s cough/ wheeze about 30 min later takes first 2 pffs of symb 80 around 5 15 am  Then next 2 pffs after 9 pm  Had been doing great prior mowing grass one day prior to OV Rarely need saba since last flare    Not limited by breathing from desired activities   rec Continue symbicort 80 Take 2 puffs first thing in am and then another 2 puffs about 12 hours later thru spacer Add singulair 10 mg each pm    12/04/2017  f/u ov/Iver Miklas re:   symb 80 2bid/ singualir sometimes  Chief Complaint  Patient presents with  . Follow-up    PFT today, continues to have cough with hoarseness. She uses her albuterol inhaler 1 x per wk on average.   Dyspnea:  fine Cough: p grass exp / chemicals/ cologne dry Sleeping: flat/ one pillow no noct symptms SABA use: rare 02: none   Flare of cough / drainage 12/01/17 > has not tried otc's Plan A = Automatic = symbicort 80 Take 2 puffs first thing in am and then another 2 puffs about 12 hours later and remember to breath it out through your nose  singulair  10 mg every evening  Plan B = Backup Only use your albuterol as a rescue medication  For nasal symptoms > zyrtec 10 mg on at bedtime as needed > too sleepy    02/20/18 NP ov rec Augmentin/ pred/ antihistamine/singulair > no better and could not take singulair / did not try zyrtec in am "makes me sleepy"     03/11/2018 extended  f/u ov/Shandel Busic re: refractory  Cough since aug 2018    Chief Complaint  Patient presents with  . Follow-up    Cough not improving. She is coughing up clear to white sputum. She states her cough is worse when she goes to work and when she first lies down at night. She is using her albuterol inhaler once daily on average.   Dyspnea:  Not limited by breathing from desired activities   Cough: worse around 5pm  Then immediately when supine x then some during the night, never took zyrtec at hs as rec with sense of globus and  throat / chest "congestion" but no excess mucus  Sleeping: on flat bed on side  SABA use: ?  if neb helps more than hfa - not really  Sure but "it sure makes me shake" Still using mint products  rec Symbicort 80  Take 2 puffs first thing in am and then another 2 puffs about 12 hours later.  Pantoprazole (protonix) 40 mg   Take  30-60 min before first meal of the day and Pepcid (famotidine)  20 mg one @  bedtime until return to office - this is the best way to tell whether stomach acid is contributing to your problem.   GERD diet  Try zyrtec 10 mg after supper  Take delsym two tsp every 12 hours and supplement if needed with  vicodin up to 1every 4 hours to suppress the urge to cough. Swallowing water and/or using ice chips/non mint and menthol containing candies (such as lifesavers or sugarless jolly ranchers) are also effective.  You should rest your voice and avoid activities that you know make you cough. Once you have eliminated the cough for 3 straight days try reducing the vicodin first,  then the delsym as tolerated.   On 03/14/18 Prednisone 10 mg take  4 each am x 2 days,   2 each am x 2 days,  1 each am x 2 days and stop  Please schedule a follow up office visit in 2 weeks, sooner if needed  with all medications /inhalers/ solutions in hand so we can verify exactly what you are taking. This includes all medications from all doctors and over the counters    03/26/2018  f/u ov/Meerab Maselli re: refractory cough/ wheeze on symb 80 2bid  Chief Complaint  Patient presents with  . Follow-up    Pt c/o sinus congestion, cough with yellow sputum and wheezing. She is using her albuterol inhaler once per wk on average.   Dyspnea:  Ok unless cough Cough:  at work and at home same severity/ frequency  Sleeping: not coughing while sleeping  SABA use:  Not really helping 02: none  gen ant chest soreness from coughing/ not able to use vicodin daytime to suppress cough rec  Only use your albuterol as a rescue  medication  Please see patient coordinator before you leave today  to schedule sinus CT Please remember to go to the  x-ray department  for your tests - we will call you with the results when they are available  Please schedule a follow up office visit in 2 weeks, sooner if needed  with all medications /inhalers/ solutions in hand so we can verify exactly what you are taking. This includes all medications from all doctors and over the counters       04/08/2018  f/u ov/Vue Pavon re: refractory cough / brought just her inhalers to [facility]   Chief Complaint  Patient presents with  . Acute Visit    Pt has had complaints of sinius drainage and a cough x4 days and also states she has had problems sleeping. Pt also has been hurting everywhere, feeling weak, and has had sweats.  Dyspnea: usually only sob when coughing Cough: white mucus / min production "but feels like it's choking her"  Sleeping: on side bed pillows too heavy for bed blocks/ worse cough now at hs / not using cough suppression as advised  SABA use: rarely / symbicort count on 50 so missed about 14 doses or continued to use beyond the red line on the prior/ warned about both 02: none Reported after much back and forth "felt good only while on prednisone" and gradually worse  off it  - turns out "felt good" does not mean the cough was gone but breathing was better. rec Prednisone 10mg   Take 4 for three days 3 for three days 2 for three days 1 for three days and stop Plan A = Automatic = Symbicort 80 Take 2 puffs first thing in am and then another 2 puffs about 12 hours later and spiriva 2 puff each am  Plan B = Backup Only use your albuterol inhaler as a rescue medication   Try gabapentin 100 mg twice daily bfast and supper and if can't take it twice daily just take at bedtime  For drainage / throat tickle try take CHLORPHENIRAMINE  4 mg (chlortabs  [facility])   Stay on protonix Take 30- 60 min before your first and last meals of the day   Take delsym two tsp every 12 hours and supplement if needed with  vicodine  up to 2 every 4 hours to suppress the urge to cough. .  Please schedule a follow up office visit in 2 weeks, sooner if needed  with all medications /inhalers/ solutions in hand so we can verify exactly what you are taking. This includes all medications from all doctors and over the counters - needs spirometry on return     Admit date: 04/14/2018 Discharge date: 04/18/2018   Equipment/Devices: Nebulizer with albuterol sol'n, 2L oxygen ordered several days prior to discharge. Apparently patient left stating she would get the nebulizer and oxygen from [facility] and did not want to wait for it to be delivered to the room per care management. Per CM note, this will be delivered to the home.   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 At the time of discharge she reportedly maintained sufficient oxygenation during ambulation, but in the ED she was found to be hypoxic, wheezing, coughing, and weak, and was subsequently readmitted. With continued treatment including IV steroids she's subjectively significantly improved. She was weaned from oxygen while at rest and still desaturates on ambulation. She is discharged with DME in stable condition with follow up in 48 hours.  Discharge Diagnoses:  Principal Problem:   Acute respiratory failure with hypoxia (HCC)   Cough variant asthma  vs uacs wit vcd   Influenza   Cough  Acute hypoxic respiratory failure due to influenza A and asthma exacerbation:  - Completed5 days of tamiflu - Continue symbicort, spiriva respimat - Scheduled and prnalbuterol nebulizer (provided at DC) -Wheezing has improved so converted to po steroid with improvement, has prednisone at home, ordered to  take 40mg  po daily until follow up.  - Continue supplemental oxygen to maintain SpO2 >90%. Despite significant improvement, does still need O2 at discharge. Will anticipate continued improvement and liberation from oxygen, will need recheck at follow up office visit with pulmonology the coming week. - Continue antitussive. PMPAware queried, has gotten hydrocodone-APAP from Dr. 3/18 2/12 and cough syrup intermittently from PCP.   Hemoptysis: CTA chest motion-degraded at last admission, no PE.  - Resolved.   Hypertension:  - Continue norvasc and HCTZ since recently stopping ARB for concern of exacerbating cough.   GERD:  - Continue PPI    04/20/2018  Extended f/u ov/Dreon Pineda re: post hosp f/u  Chief Complaint  Patient presents with  . Follow-up    Breathing has improved. She is not using her  albuterol inhaler but is using albuterol neb about 3 x per day.   Dyspnea:  MMRC2 = can't walk a nl pace on a flat grade s sob but does fine slow and flat  Cough: gone/ min urge to clear throat but now on tussionex Sleeping: side / pillows to prop up  SABA use: way over using as above  02: none at all  rec Continue pantoprazole 40 mg Take 30- 60 min before your first and last meals of the day  Gabapentin  Restart 100 mg twice daily bfast and bedtime Plan A = Automatic = Symbicort 80 Take 2 puffs first thing in am and then another 2 puffs about 12 hours later.  Work on inhaler technique:   Plan B = Backup Only use your albuterol inhaler as a rescue medication  Plan C = Crisis - only use your albuterol nebulizer if you first try Plan B and it fails to help > ok to use the nebulizer up to every 4 hours but if start needing it regularly call for immediate appointment Finish the prednisone and stop spiriva F/u in 2 weeks with meds in hand (#35 on present symb 80 and one sample given) Add:  Consider performist/bud neb if can't wean off pred/saba neb and hfa makes her cough worse due to effects on upper  airway    05/04/2018  f/u ov/Zohair Epp re:   symbicort 80 one full at home and total of 45 puffs samples/ 32 gabapenitn  Chief Complaint  Patient presents with  . Acute Visit    Cough has improved but has not resolved. She is coughing up min, clear sputum. She has not had to use her albuterol inhaler.   Dyspnea:  MMRC1 = can walk nl pace, flat grade, can't hurry or go uphills or steps s sob   Cough: p exertion Sleeping: fine 30 degrees on pillows  SABA use: no saba  02: no rec Increase gabapentin to 100 mg three times a day  ok to return to work 05/16/2018  Please schedule a follow up office visit in 3 weeks, sooner if needed  with all medications /inhalers/ solutions in hand so we can verify exactly what you are taking. This includes all medications from all doctors and over the counters        History of Present Illness: 05/25/2018   televist/Gevork Ayyad re: cough/ sob symbicort 80 2bid /still on samples "half full"  Dyspnea:  Walked for 5 minutes some inclines  Cough: some daytime / dry assoc with hoarseness  Sleeping: flat bed/ 3 pillows  SABA use: none   rec For drainage / throat tickle try take CHLORPHENIRAMINE  4 mg (chlortab 4 mg) - take one every 4 hours as needed - available over the counter- may cause drowsiness so start with just a bedtime dose or two and see how you tolerate it before trying in daytime   Increase gabapentin to 100 mg four times daily  Continue Symbicort 80 Take 2 puffs first thing in am and then another 2 puffs about 12 hours later (use coupon and let me know if they won't honor it  I will take you out of work until end of April 2020      06/19/18 Defer work note to Dr Parke Simmers re back For drainage / throat tickle try take CHLORPHENIRAMINE  4 mg  (Chlortab 4mg   at should be easiest to find in the green box)  take one every 4 hours as needed -  available over the counter- may cause drowsiness so start with just a bedtime dose or two and see how you  tolerate it before trying in daytime   Elevate head of bed on 6-8 inch bed block   07/23/2018  f/u ov/Sherida Dobkins re: asthma with uacs  On symb 80 2bid , gapentin 100 qid /ppi bid Chief Complaint  Patient presents with  . Follow-up    Breathing is much improved. She is coughing minimally- mainly in the am's. She has not had to use her rescue inhaler in over a month.  Dyspnea:  Walks x 20 mins s stopping, some hills Cough: minimal/ non productive, day >noct  Sleeping: bed is flat  3pillows for back SABA use: none   rec No change in medications Ok to leave off pm dose of pantaprozole if not coughing Ok to return to work without restrictions on August 10 2018    09/01/2018  f/u ov/Corvin Sorbo re: asthma  Vs vcd back on ppi bid / not keeping up with symbicort rx due to cost but did not contact us re this issue and just stopped taking  Chief Complaint  Patient presents with  . Follow-up    increased cough for a wk- prod with white sputum. She is also wheezing and has had to use her albuterol 4 x since her last visit.   Dyspnea:  Not limited by breathing from desired activities  / up and down steps  Cough: gradually worse x one week / min sputum mostly in ams Sleeping: at least once per night wakes up needing saba  SABA use: 3-4 x since 02: none  rec Plan A = Automatic = dulera 100 Take 2 puffs first thing in am and then another 2 puffs about 12 hours later.  Plan B = Backup Only use your albuterol (proair)  inhaler as a rescue medication to be used if you can't catch your breath by resting or doing a relaxed purse lip breathing pattern.  - The less you use it, the better it will work when you need it. - Ok to use the inhaler up to 2 puffs  every 4 hours if you must but call for appointment if use goes up over your usual need - Don't leave home without it !!  (think of it like the spare tire for your car)  Prednisone 10 mg take  4 each am x 2 days,   2 each am x 2 days,  1 each am x 2 days and stop     10/21/2018  f/u ov/Fidelia Cathers re: chronic asthma with component of uacs/vcd/ can't tol h1 too dry  Gabapentin seems to be helping  Chief Complaint  Patient presents with  . Follow-up    Breathing is about the same. She has not used her albuterol since the last visit.   Dyspnea:  MMRC1 = can walk nl pace, flat grade, can't hurry or go uphills or steps s sob   Cough: sometimes around cleaning fluid / diesel Sleeping: ok on pillows/ bed is flat SABA use: none 02: none    No obvious day to day or daytime variability or assoc excess/ purulent sputum or mucus plugs or hemoptysis or cp or chest tightness, subjective wheeze or overt sinus or hb symptoms.   Sleeping fine nwo  without nocturnal  or early am exacerbation  of respiratory  c/o's or need for noct saba. Also denies any obvious fluctuation of symptoms with weather or environmental changes or other aggravating or  alleviating factors except as outlined above   No unusual exposure hx or h/o childhood pna/ asthma or knowledge of premature birth.  Current Allergies, Complete Past Medical History, Past Surgical History, Family History, and Social History were reviewed in [facility] electronic medical record.  ROS  The following are not active complaints unless bolded Hoarseness, sore throat, dysphagia, dental problems, itching, sneezing,  nasal congestion or discharge of excess mucus or purulent secretions, ear ache,   fever, chills, sweats, unintended wt loss or wt gain, classically pleuritic or exertional cp,  orthopnea pnd or arm/hand swelling  or leg swelling, presyncope, palpitations, abdominal pain, anorexia, nausea, vomiting, diarrhea  or change in bowel habits or change in bladder habits, change in stools or change in urine, dysuria, hematuria,  rash, arthralgias, visual complaints, headache, numbness, weakness or ataxia or problems with walking or coordination,  change in mood or  memory.        Current Meds  Medication Sig  .  acyclovir (ZOVIRAX) 200 MG capsule Take 200 mg by mouth 2 (two) times daily as needed (outbreaks).   Marland Kitchen. albuterol (PROVENTIL HFA;VENTOLIN HFA) 108 (90 BASE) MCG/ACT inhaler Inhale 2 puffs into the lungs every 6 (six) hours as needed for wheezing or shortness of breath.  Marland Kitchen. amLODipine (NORVASC) 5 MG tablet Take 1 tablet (5 mg total) by mouth daily.  . chlorpheniramine (CHLOR-TRIMETON) 4 MG tablet Take 4 mg by mouth every 4 (four) hours as needed for allergies.  Marland Kitchen. gabapentin (NEURONTIN) 100 MG capsule Take 1 capsule (100 mg total) by mouth 4 (four) times daily.  . hydrochlorothiazide (MICROZIDE) 12.5 MG capsule Take 1 capsule (12.5 mg total) by mouth daily.  . mometasone-formoterol (DULERA) 100-5 MCG/ACT AERO Take 2 puffs first thing in am and then another 2 puffs about 12 hours later.  . pantoprazole (PROTONIX) 40 MG tablet Take 1 tablet (40 mg total) by mouth 2 (two) times daily before a meal.  . valsartan (DIOVAN) 40 MG tablet Take 1 tablet by mouth daily.                     Objective:   Physical Exam    amb somber bf nad pseudowheeze only   10/21/2018        172  09/01/2018          169 07/23/2018        170  05/04/2018         170  04/20/2018       169  04/08/2018       164  03/26/2018       173  03/11/2018       173  12/04/2017     170  11/04/2017       169  10/21/2017       169  02/06/2017     171   12/26/16 171 lb 6.4 oz (77.7 kg)  11/18/13 170 lb (77.1 kg)  03/21/11 171 lb (77.6 kg)    Vital signs reviewed - Note on arrival 02 sats  98% on RA     HEENT : pt wearing mask not removed for exam due to covid - 19 concerns.    NECK :  without JVD/Nodes/TM/ nl carotid upstrokes bilaterally   LUNGS: no acc muscle use,  Nl contour chest which is clear to A and P bilaterally without cough on insp or exp maneuvers   CV:  RRR  no s3 or murmur or increase in P2, and no  edema   ABD:  soft and nontender with nl inspiratory excursion in the supine position. No bruits or  organomegaly appreciated, bowel sounds nl  MS:  Nl gait/ ext warm without deformities, calf tenderness, cyanosis or clubbing No obvious joint restrictions   SKIN: warm and dry without lesions    NEURO:  alert, approp, nl sensorium with  no motor or cerebellar deficits apparent.                             Assessment:

## 2018-10-21 NOTE — Assessment & Plan Note (Signed)
  Recurrent cough since teenager really bad and daily since Aug 2018 12/26/2016  rec symbicort 80 2bid then return for full pfts - PFT's  02/06/2017  FEV1 1.37 (80 % ) ratio 67  p 5 % improvement from saba p symb 80  prior to study with DLCO  66/66c % corrects to 79  % for alv volume   - Spirometry 10/21/2017  FEV1 1.18 (70%)  Ratio 71 with min curvature during attack of cough/ mostly pseudowheeze on exam on symb 80 x2  - FENO 10/21/2017  =   7  On symb 80 x 2  - 11/04/2017     90% with spacer  - singulair added 11/04/2017  But not taking consistently as of 12/04/2017 > rec restart daily  X one week and stopped it due "dizzy"  - PFT's  12/04/2017  FEV1 1.34 (85 % ) ratio 67  p 20 % improvement from saba p nothing prior to study with DLCO  71 % corrects to 80  % for alv volume   - Allergy profile 12/04/2017 >  Eos 0.0 /  IgE  546 dust only  - cyclical cough rx 6/44/0347  - Spirometry 03/26/2018  FEV1 1.1 (73%)  Ratio 0.64 - 03/26/2018  After extensive coaching inhaler device,  effectiveness =    90% s spacer  - Gabapentin 100 mg tid trial 03/26/2018> attempted rx by non-adherent  - Sinus CT 04/07/2018 > Negative study.  No evidence of paranasal sinusitis. - Spirometry 04/08/2018  FEV1 0.7 (47%)  Ratio 0.52 with convex curvature p am symb 80 x 2 and neb in office   - 04/08/2018  After extensive coaching inhaler device,  effectiveness =    75% with smi > added trial of spiriva 2pffs daily x 2 weeks then return  - CTa 04/11/18 for hemoptysis with refractory cough > Limited study due to respiratory motion. No obvious pulmonary embolism or other acute findings. - 04/20/2018  After extensive coaching inhaler device,  effectiveness =    0% with spiriva smi which caused immediate severe cough so rec d/c spiriva  - 04/20/2018 rechallenge with gabapentin 100 mg bid  - 05/04/2018  After extensive coaching inhaler device,  effectiveness =    90% with spacer - s spacer starts coughing immediately  - Spirometry 05/04/2018  FEV1  1.2 (75%)  Ratio 0.65 with mild curvature p symb 80 x 2 puffs   - 05/04/2018 increased gabapentin to 100 tid   - 05/25/2018 increased gabapentin to  100 mg qid > improved  - 07/23/2018 try ppi just in am  07/23/2018  After extensive coaching inhaler device,  effectiveness =    90% with spacer from baseline 75%  - 09/01/2018 changed to dulera 100 due to cost     All goals of chronic asthma control met including optimal function and elimination of symptoms with minimal need for rescue therapy on dulera 100 2bid > no change rx   Contingencies discussed in full including contacting this office immediately if not controlling the symptoms using the rule of two's.

## 2018-10-21 NOTE — Assessment & Plan Note (Addendum)
Too dry from use of  1st gen H1 blockers per guidelines  > d/c and use flonase otc prn   Each maintenance medication was reviewed in detail including most importantly the difference between maintenance and as needed and under what circumstances the prns are to be used.  Please see AVS for specific  Instructions which are unique to this visit and I personally typed out  which were reviewed in detail in writing with the patient and a copy provided.    Reviewed strategy of avoiding throat clearing by using hard rock candies and using plm to prevent pseudowheeze.   > 50% of 25 min spent counseling

## 2018-10-21 NOTE — Patient Instructions (Addendum)
No change in medications - keep up with your refills   Remember to do purse lip breathing when you hear any wheeze    Please schedule a follow up visit in 3 months but call sooner if needed

## 2018-10-23 ENCOUNTER — Ambulatory Visit: Payer: BC Managed Care – PPO | Admitting: Internal Medicine

## 2018-10-23 ENCOUNTER — Ambulatory Visit: Payer: BLUE CROSS/BLUE SHIELD | Admitting: Internal Medicine

## 2018-11-13 DIAGNOSIS — E669 Obesity, unspecified: Secondary | ICD-10-CM | POA: Diagnosis not present

## 2018-11-13 DIAGNOSIS — F064 Anxiety disorder due to known physiological condition: Secondary | ICD-10-CM | POA: Diagnosis not present

## 2018-11-13 DIAGNOSIS — M130 Polyarthritis, unspecified: Secondary | ICD-10-CM | POA: Diagnosis not present

## 2018-11-13 DIAGNOSIS — I10 Essential (primary) hypertension: Secondary | ICD-10-CM | POA: Diagnosis not present

## 2019-01-13 ENCOUNTER — Ambulatory Visit: Payer: BC Managed Care – PPO | Admitting: Internal Medicine

## 2019-01-13 ENCOUNTER — Other Ambulatory Visit: Payer: Self-pay

## 2019-01-13 ENCOUNTER — Encounter: Payer: Self-pay | Admitting: Internal Medicine

## 2019-01-13 ENCOUNTER — Ambulatory Visit (INDEPENDENT_AMBULATORY_CARE_PROVIDER_SITE_OTHER): Payer: BC Managed Care – PPO

## 2019-01-13 DIAGNOSIS — J45991 Cough variant asthma: Secondary | ICD-10-CM | POA: Diagnosis not present

## 2019-01-13 DIAGNOSIS — R918 Other nonspecific abnormal finding of lung field: Secondary | ICD-10-CM | POA: Diagnosis not present

## 2019-01-13 DIAGNOSIS — R079 Chest pain, unspecified: Secondary | ICD-10-CM | POA: Diagnosis not present

## 2019-01-13 DIAGNOSIS — M25512 Pain in left shoulder: Secondary | ICD-10-CM

## 2019-01-13 MED ORDER — METHOCARBAMOL 750 MG PO TABS: 750.0000 mg | ORAL_TABLET | Freq: Four times a day (QID) | ORAL | 2 refills | Status: DC

## 2019-01-13 NOTE — Progress Notes (Signed)
Subjective:    Patient ID: Sue Mitchell, female   DOB: 1955/07/12     MRN: [id number]   Brief patient profile:  16 yobf  RT quit smoking 2011 @ wt 140  with h/o sinus symptoms assoc with cough and need for saba prn as teenager while living in [place] but after arrival here at age late 63's it changed to point where bothered her mostly with onset of cold weather typically req ov rx  abx and prednisone help a Sue and this pattern continued even after quit smoking to where the episodes last longer and onset of this not directed linked to cold weather in fact  had one April 2018 never  Completely cleared  then flared again in Oct 01 2016 severe dry cough / eval by allergist /ent since onset neg w/u so referred by Dr Blenda Nicely to pulmonary clinic 12/26/16     History of Present Illness  12/26/2016 1st [facility] office visit/ Sue Mitchell   Chief Complaint  Patient presents with  . Pulm Consult    Pt referred by Dr. Lind Guest ENT. Pt has productive cough-clear mucus sometimes yellow. Pt had horseness in voice for 6-8 weeks, no chills or fever, have some chest pain and tightness with cough.  recurrent cough since April 2018 never resolved p rx as uri then flaired early August 2018 rx zpak/pred/saba> some better  Cough was worse at hs now   Am feels wheezy not really coughing much up but what she does is white and < 1 tbsp Cough seems worse with exp to cold air at work  Assoc overt hb better on zantac  Some gen ant chest discomfort with severe coughing fits  rec Plan A = Automatic =  symbicort 80 Take 2 puffs first thing in am and then another 2 puffs about 12 hours later.  Plan B = Backup Only use your albuterol as a rescue medication  Start zantac 150- 300 mg after bfast and after supper until cough better  GERD diet    02/06/2017  f/u ov/Sue Mitchell re:  GOLD I copd/ cough on cold air exp  Chief Complaint  Patient presents with  . Follow-up    Cough had improved but then worsened again  with colder weather. She is using her albuterol inhaler 2 x per wk on average.   not limited by doe / steps ok except for knees  Some gerd at hs taking h2 am only  rec Pantoprazole (protonix) 40 mg  Take  30-60 min before first meal of the day and Zantac  300 mg bedtime until return to office - this is the best way to tell whether stomach acid is contributing to your problem.   GERD diet  Please schedule a follow up office visit in 6 weeks, call sooner if needed     10/21/2017  f/u ov/Sue Mitchell re: acute refractory cough / saw allergist cannot name "all neg"  ? Sue Mitchell?  Chief Complaint  Patient presents with  . Follow-up    Last seen by [facility] 02/06/17. Patient states she has had a productive cough, post nasal drip, and constantly using her voice. At night, she becomes so congested that she has to breathe through her mouth instead of nose.    from last ov until 09/25/17 fine on on ranitidine one bid/symb 80 2bid Never took protonix  Was still needing saba twice a week at baseline and a Sue more while working at [employer]  Then abruptly  worse Aug 1  lots sinus drainage clear > zpak early in Aug by Sue Mitchell some better for a week or two then worse says ran out of symbicort one day prior to ov / using lots of saba now and severe nasal congestion as well as hoarseness and can't sleep due to cough rec Plan A = Automatic = symbiocort 80 Take 2 puffs first thing in am and then another 2 puffs about 12 hours later.  Plan B = Backup Only use your albuterol as a rescue medication  Prednisone 10 mg take  4 each am x 2 days,   2 each am x 2 days,  1 each am x 2 days and stop  Pantoprazole (protonix) 40 mg   Take  30-60 min before first meal of the day and ranitidine 300 mg each evening GERD diet      11/04/2017  f/u ov/Sue Mitchell re: atypical asthma in RT  Chief Complaint  Patient presents with  . Follow-up    Pt c/o sneezing, wheezing and cough with clear sputum- started after she mowed her lawn 1 day ago. She  has had to use her albuterol inhaler.   wakes up feeling good most am's s cough/ wheeze about 30 min later takes first 2 pffs of symb 80 around 5 15 am  Then next 2 pffs after 9 pm  Had been doing great prior mowing grass one day prior to OV Rarely need saba since last flare    Not limited by breathing from desired activities   rec Continue symbicort 80 Take 2 puffs first thing in am and then another 2 puffs about 12 hours later thru spacer Add singulair 10 mg each pm    12/04/2017  f/u ov/Sue Mitchell re:   symb 80 2bid/ singualir sometimes  Chief Complaint  Patient presents with  . Follow-up    PFT today, continues to have cough with hoarseness. She uses her albuterol inhaler 1 x per wk on average.   Dyspnea:  fine Cough: p grass exp / chemicals/ cologne dry Sleeping: flat/ one pillow no noct symptms SABA use: rare 02: none   Flare of cough / drainage 12/01/17 > has not tried otc's Plan A = Automatic = symbicort 80 Take 2 puffs first thing in am and then another 2 puffs about 12 hours later and remember to breath it out through your nose  singulair  10 mg every evening  Plan B = Backup Only use your albuterol as a rescue medication  For nasal symptoms > zyrtec 10 mg on at bedtime as needed > too sleepy    02/20/18 NP ov rec Augmentin/ pred/ antihistamine/singulair > no better and could not take singulair / did not try zyrtec in am "makes me sleepy"     03/11/2018 extended  f/u ov/Sue Mitchell re: refractory  Cough since aug 2018    Chief Complaint  Patient presents with  . Follow-up    Cough not improving. She is coughing up clear to white sputum. She states her cough is worse when she goes to work and when she first lies down at night. She is using her albuterol inhaler once daily on average.   Dyspnea:  Not limited by breathing from desired activities   Cough: worse around 5pm  Then immediately when supine x then some during the night, never took zyrtec at hs as rec with sense of globus and  throat / chest "congestion" but no excess mucus  Sleeping: on flat bed on side  SABA use: ?  if neb helps more than hfa - not really  Sure but "it sure makes me shake" Still using mint products  rec Symbicort 80  Take 2 puffs first thing in am and then another 2 puffs about 12 hours later.  Pantoprazole (protonix) 40 mg   Take  30-60 min before first meal of the day and Pepcid (famotidine)  20 mg one @  bedtime until return to office - this is the best way to tell whether stomach acid is contributing to your problem.   GERD diet  Try zyrtec 10 mg after supper  Take delsym two tsp every 12 hours and supplement if needed with  vicodin up to 1every 4 hours to suppress the urge to cough. Swallowing water and/or using ice chips/non mint and menthol containing candies (such as lifesavers or sugarless jolly ranchers) are also effective.  You should rest your voice and avoid activities that you know make you cough. Once you have eliminated the cough for 3 straight days try reducing the vicodin first,  then the delsym as tolerated.   On 03/14/18 Prednisone 10 mg take  4 each am x 2 days,   2 each am x 2 days,  1 each am x 2 days and stop  Please schedule a follow up office visit in 2 weeks, sooner if needed  with all medications /inhalers/ solutions in hand so we can verify exactly what you are taking. This includes all medications from all doctors and over the counters    03/26/2018  f/u ov/Dax Murguia re: refractory cough/ wheeze on symb 80 2bid  Chief Complaint  Patient presents with  . Follow-up    Pt c/o sinus congestion, cough with yellow sputum and wheezing. She is using her albuterol inhaler once per wk on average.   Dyspnea:  Ok unless cough Cough:  at work and at home same severity/ frequency  Sleeping: not coughing while sleeping  SABA use:  Not really helping 02: none  gen ant chest soreness from coughing/ not able to use vicodin daytime to suppress cough rec  Only use your albuterol as a rescue  medication  Please see patient coordinator before you leave today  to schedule sinus CT Please remember to go to the  x-ray department  for your tests - we will call you with the results when they are available  Please schedule a follow up office visit in 2 weeks, sooner if needed  with all medications /inhalers/ solutions in hand so we can verify exactly what you are taking. This includes all medications from all doctors and over the counters       04/08/2018  f/u ov/Fredrich Cory re: refractory cough / brought just her inhalers to [facility]   Chief Complaint  Patient presents with  . Acute Visit    Pt has had complaints of sinius drainage and a cough x4 days and also states she has had problems sleeping. Pt also has been hurting everywhere, feeling weak, and has had sweats.  Dyspnea: usually only sob when coughing Cough: white mucus / min production "but feels like it's choking her"  Sleeping: on side bed pillows too heavy for bed blocks/ worse cough now at hs / not using cough suppression as advised  SABA use: rarely / symbicort count on 50 so missed about 14 doses or continued to use beyond the red line on the prior/ warned about both 02: none Reported after much back and forth "felt good only while on prednisone" and gradually worse  off it  - turns out "felt good" does not mean the cough was gone but breathing was better. rec Prednisone 10mg   Take 4 for three days 3 for three days 2 for three days 1 for three days and stop Plan A = Automatic = Symbicort 80 Take 2 puffs first thing in am and then another 2 puffs about 12 hours later and spiriva 2 puff each am  Plan B = Backup Only use your albuterol inhaler as a rescue medication   Try gabapentin 100 mg twice daily bfast and supper and if can't take it twice daily just take at bedtime  For drainage / throat tickle try take CHLORPHENIRAMINE  4 mg (chlortabs  [facility])   Stay on protonix Take 30- 60 min before your first and last meals of the day   Take delsym two tsp every 12 hours and supplement if needed with  vicodine  up to 2 every 4 hours to suppress the urge to cough. .  Please schedule a follow up office visit in 2 weeks, sooner if needed  with all medications /inhalers/ solutions in hand so we can verify exactly what you are taking. This includes all medications from all doctors and over the counters - needs spirometry on return     Admit date: 04/14/2018 Discharge date: 04/18/2018   Equipment/Devices: Nebulizer with albuterol sol'n, 2L oxygen ordered several days prior to discharge. Apparently patient left stating she would get the nebulizer and oxygen from [facility] and did not want to wait for it to be delivered to the room per care management. Per CM note, this will be delivered to the home.   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 At the time of discharge she reportedly maintained sufficient oxygenation during ambulation, but in the ED she was found to be hypoxic, wheezing, coughing, and weak, and was subsequently readmitted. With continued treatment including IV steroids she's subjectively significantly improved. She was weaned from oxygen while at rest and still desaturates on ambulation. She is discharged with DME in stable condition with follow up in 48 hours.  Discharge Diagnoses:  Principal Problem:   Acute respiratory failure with hypoxia (HCC)   Cough variant asthma  vs uacs wit vcd   Influenza   Cough  Acute hypoxic respiratory failure due to influenza A and asthma exacerbation:  - Completed5 days of tamiflu - Continue symbicort, spiriva respimat - Scheduled and prnalbuterol nebulizer (provided at DC) -Wheezing has improved so converted to po steroid with improvement, has prednisone at home, ordered to  take 40mg  po daily until follow up.  - Continue supplemental oxygen to maintain SpO2 >90%. Despite significant improvement, does still need O2 at discharge. Will anticipate continued improvement and liberation from oxygen, will need recheck at follow up office visit with pulmonology the coming week. - Continue antitussive. PMPAware queried, has gotten hydrocodone-APAP from Dr. 3/18 2/12 and cough syrup intermittently from PCP.   Hemoptysis: CTA chest motion-degraded at last admission, no PE.  - Resolved.   Hypertension:  - Continue norvasc and HCTZ since recently stopping ARB for concern of exacerbating cough.   GERD:  - Continue PPI    04/20/2018  Extended f/u ov/Ollen Rao re: post hosp f/u  Chief Complaint  Patient presents with  . Follow-up    Breathing has improved. She is not using her  albuterol inhaler but is using albuterol neb about 3 x per day.   Dyspnea:  MMRC2 = can't walk a nl pace on a flat grade s sob but does fine slow and flat  Cough: gone/ min urge to clear throat but now on tussionex Sleeping: side / pillows to prop up  SABA use: way over using as above  02: none at all  rec Continue pantoprazole 40 mg Take 30- 60 min before your first and last meals of the day  Gabapentin  Restart 100 mg twice daily bfast and bedtime Plan A = Automatic = Symbicort 80 Take 2 puffs first thing in am and then another 2 puffs about 12 hours later.  Work on inhaler technique:   Plan B = Backup Only use your albuterol inhaler as a rescue medication  Plan C = Crisis - only use your albuterol nebulizer if you first try Plan B and it fails to help > ok to use the nebulizer up to every 4 hours but if start needing it regularly call for immediate appointment Finish the prednisone and stop spiriva F/u in 2 weeks with meds in hand (#35 on present symb 80 and one sample given) Add:  Consider performist/bud neb if can't wean off pred/saba neb and hfa makes her cough worse due to effects on upper  airway    05/04/2018  f/u ov/Mekhi Sonn re:   symbicort 80 one full at home and total of 45 puffs samples/ 32 gabapenitn  Chief Complaint  Patient presents with  . Acute Visit    Cough has improved but has not resolved. She is coughing up min, clear sputum. She has not had to use her albuterol inhaler.   Dyspnea:  MMRC1 = can walk nl pace, flat grade, can't hurry or go uphills or steps s sob   Cough: p exertion Sleeping: fine 30 degrees on pillows  SABA use: no saba  02: no rec Increase gabapentin to 100 mg three times a day  ok to return to work 05/16/2018  Please schedule a follow up office visit in 3 weeks, sooner if needed  with all medications /inhalers/ solutions in hand so we can verify exactly what you are taking. This includes all medications from all doctors and over the counters        History of Present Illness: 05/25/2018   televist/Destine Ambroise re: cough/ sob symbicort 80 2bid /still on samples "half full"  Dyspnea:  Walked for 5 minutes some inclines  Cough: some daytime / dry assoc with hoarseness  Sleeping: flat bed/ 3 pillows  SABA use: none   rec For drainage / throat tickle try take CHLORPHENIRAMINE  4 mg (chlortab 4 mg) - take one every 4 hours as needed - available over the counter- may cause drowsiness so start with just a bedtime dose or two and see how you tolerate it before trying in daytime   Increase gabapentin to 100 mg four times daily  Continue Symbicort 80 Take 2 puffs first thing in am and then another 2 puffs about 12 hours later (use coupon and let me know if they won't honor it  I will take you out of work until end of April 2020      06/19/18 Defer work note to Dr Parke Simmers re back For drainage / throat tickle try take CHLORPHENIRAMINE  4 mg  (Chlortab 4mg   at should be easiest to find in the green box)  take one every 4 hours as needed -  available over the counter- may cause drowsiness so start with just a bedtime dose or two and see how you  tolerate it before trying in daytime   Elevate head of bed on 6-8 inch bed block   07/23/2018  f/u ov/Lexie Koehl re: asthma with uacs  On symb 80 2bid , gapentin 100 qid /ppi bid Chief Complaint  Patient presents with  . Follow-up    Breathing is much improved. She is coughing minimally- mainly in the am's. She has not had to use her rescue inhaler in over a month.  Dyspnea:  Walks x 20 mins s stopping, some hills Cough: minimal/ non productive, day >noct  Sleeping: bed is flat  3pillows for back SABA use: none   rec No change in medications Ok to leave off pm dose of pantaprozole if not coughing Ok to return to work without restrictions on August 10 2018    09/01/2018  f/u ov/Flo Berroa re: asthma  Vs vcd back on ppi bid / not keeping up with symbicort rx due to cost but did not contact us re this issue and just stopped taking  Chief Complaint  Patient presents with  . Follow-up    increased cough for a wk- prod with white sputum. She is also wheezing and has had to use her albuterol 4 x since her last visit.   Dyspnea:  Not limited by breathing from desired activities  / up and down steps  Cough: gradually worse x one week / min sputum mostly in ams Sleeping: at least once per night wakes up needing saba  SABA use: 3-4 x since 02: none  rec Plan A = Automatic = dulera 100 Take 2 puffs first thing in am and then another 2 puffs about 12 hours later.  Plan B = Backup Only use your albuterol (proair)  inhaler as a rescue medication to be used if you can't catch your breath by resting or doing a relaxed purse lip breathing pattern.  - The less you use it, the better it will work when you need it. - Ok to use the inhaler up to 2 puffs  every 4 hours if you must but call for appointment if use goes up over your usual need - Don't leave home without it !!  (think of it like the spare tire for your car)  Prednisone 10 mg take  4 each am x 2 days,   2 each am x 2 days,  1 each am x 2 days and stop     10/21/2018  f/u ov/Ketty Bitton re: chronic asthma with component of uacs/vcd/ can't tol h1 too dry  Gabapentin seems to be helping  Chief Complaint  Patient presents with  . Follow-up    Breathing is about the same. She has not used her albuterol since the last visit.   Dyspnea:  MMRC1 = can walk nl pace, flat grade, can't hurry or go uphills or steps s sob   Cough: sometimes around cleaning fluid / diesel Sleeping: ok on pillows/ bed is flat SABA use: none 02: none  rec No change in medications - keep up with your refills  Remember to do purse lip breathing when you hear any wheeze    01/13/2019  f/u ov/Callum Wolf re:  Asthma/ vcd reduced gabapentin to 100 bid and ppi to qd and no change in cough  Chief Complaint  Patient presents with  . Follow-up    Breathing is overall doing well. She has not been using her  albuterol regularly.   Dyspnea:  MMRC2 = can't walk a nl pace on a flat grade s sob but does fine slow and flat  X one month  Cough: better than usual  Sleeping:  3  pillows  SABA use: none  02: none Onset of new pain positional L  shoulder posteriorly x 2 weeks  > ortho eval years dx "muscle knots" same area  Better with heating pad / no neck pain, no radicular symptoms or numbness    No obvious day to day or daytime variability or assoc excess/ purulent sputum or mucus plugs or hemoptysis or cp or chest tightness, subjective wheeze or overt sinus or hb symptoms.   Sleeping as above  without nocturnal  or early am exacerbation  of respiratory  c/o's or need for noct saba. Also denies any obvious fluctuation of symptoms with weather or environmental changes or other aggravating or alleviating factors except as outlined above   No unusual exposure hx or h/o childhood pna/ asthma or knowledge of premature birth.  Current Allergies, Complete Past Medical History, Past Surgical History, Family History, and Social History were reviewed in [employer]  record.  ROS  The following are not active complaints unless bolded Hoarseness, sore throat, dysphagia, dental problems, itching, sneezing,  nasal congestion or discharge of excess mucus or purulent secretions, ear ache,   fever, chills, sweats, unintended wt loss or wt gain, classically pleuritic or exertional cp,  orthopnea pnd or arm/hand swelling  or leg swelling, presyncope, palpitations, abdominal pain, anorexia, nausea, vomiting, diarrhea  or change in bowel habits or change in bladder habits, change in stools or change in urine, dysuria, hematuria,  rash, arthralgias, visual complaints, headache, numbness, weakness or ataxia or problems with walking or coordination,  change in mood or  memory.        Current Meds  Medication Sig  . acyclovir (ZOVIRAX) 200 MG capsule Take 200 mg by mouth 2 (two) times daily as needed (outbreaks).   Marland Kitchen albuterol (PROVENTIL HFA;VENTOLIN HFA) 108 (90 BASE) MCG/ACT inhaler Inhale 2 puffs into the lungs every 6 (six) hours as needed for wheezing or shortness of breath.  Marland Kitchen amLODipine (NORVASC) 5 MG tablet Take 1 tablet (5 mg total) by mouth daily.  Marland Kitchen gabapentin (NEURONTIN) 100 MG capsule Take 1 capsule (100 mg total) by mouth 4 (four) times daily.  . hydrochlorothiazide (MICROZIDE) 12.5 MG capsule Take 1 capsule (12.5 mg total) by mouth daily.  . mometasone-formoterol (DULERA) 100-5 MCG/ACT AERO Take 2 puffs first thing in am and then another 2 puffs about 12 hours later.  . pantoprazole (PROTONIX) 40 MG tablet Take 1 tablet (40 mg total) by mouth 2 (two) times daily before a meal.  . valsartan (DIOVAN) 40 MG tablet Take 1 tablet by mouth daily.                        Objective:   Physical Exam   amb bf nad   01/13/2019      175  10/21/2018        172  09/01/2018          169 07/23/2018        170  05/04/2018         170  04/20/2018       169  04/08/2018       164  03/26/2018       173  03/11/2018  173  12/04/2017     170  11/04/2017       169   10/21/2017       169  02/06/2017     171   12/26/16 171 lb 6.4 oz (77.7 kg)  11/18/13 170 lb (77.1 kg)  03/21/11 171 lb (77.6 kg)     Vital signs reviewed - Note on arrival 02 sats  100% on RA        HEENT : pt wearing mask not removed for exam due to covid -19 concerns.    NECK :  without JVD/Nodes/TM/ nl carotid upstrokes bilaterally   LUNGS: no acc muscle use,  Nl contour chest which is clear to A and P bilaterally without cough on insp or exp maneuvers   CV:  RRR  no s3 or murmur or increase in P2, and no edema   ABD:  soft and nontender with nl inspiratory excursion in the supine position. No bruits or organomegaly appreciated, bowel sounds nl  MS:  Nl gait/ ext warm without deformities, calf tenderness, cyanosis or clubbing No obvious joint restrictions but has pain L shoulder with ext and ext rotation.  SKIN: warm and dry without lesions    NEURO:  alert, approp, nl sensorium with  no motor or cerebellar deficits apparent.    CXR PA and Lateral:   01/13/2019 :    I personally reviewed images and agree with radiology impression as follows:   1. Mild cardiomegaly. There is pulmonary vascular prominence without overt pulmonary edema.  2. There is a new irregular opacity of the right midlung, measuring approximately 1.4 x 0.7 cm in projection My review: no abn on CT chest 04/11/2018 in this area so likely post inflammatory and CT in 6 weeks rec as pos h/o smoking                      Assessment:

## 2019-01-13 NOTE — Patient Instructions (Addendum)
Increase gabapentin to 100  Mg four times daily until all cough and pain are better   For muscle spasm try robaxin 750  four times a day as needed and call your othopedic doctor Lindaann Slough) for follow up [phone]  Please schedule a follow up visit in 6 months but call sooner if needed

## 2019-01-14 ENCOUNTER — Encounter: Payer: Self-pay | Admitting: Internal Medicine

## 2019-01-14 ENCOUNTER — Telehealth: Payer: Self-pay | Admitting: Internal Medicine

## 2019-01-14 DIAGNOSIS — R918 Other nonspecific abnormal finding of lung field: Secondary | ICD-10-CM

## 2019-01-14 DIAGNOSIS — M25512 Pain in left shoulder: Secondary | ICD-10-CM | POA: Insufficient documentation

## 2019-01-14 NOTE — Assessment & Plan Note (Signed)
Newly noted 01/13/2019 with nl CTa 04/11/2018 / quit smoking 2011  - Re CT s contrast for by end of year.  Most likely post inflammatory but given smoking hx reasonable to do CT s contrast at 6 weeks to be sure.   Discussed in detail all the  indications, usual  risks and alternatives  relative to the benefits with patient who agrees to proceed with w/u as outlined.      I had an extended discussion with the patient reviewing all relevant studies completed to date and  lasting 15 to 20 minutes of a 25 minute visit    Each maintenance medication was reviewed in detail including most importantly the difference between maintenance and prns and under what circumstances the prns are to be triggered using an action plan format that is not reflected in the computer generated alphabetically organized AVS.     Please see AVS for specific instructions unique to this visit that I personally wrote and verbalized to the the pt in detail and then reviewed with pt  by my nurse highlighting any  changes in therapy recommended at today's visit to their plan of care.

## 2019-01-14 NOTE — Assessment & Plan Note (Addendum)
Positional/ onset first week in Nov 2020  - rec 01/13/2019 robaxin/ ortho re-eval (has seen for similar symptoms in past)   Can also use max gabapentin  = 100 qid for pain control pending ortho eval

## 2019-01-14 NOTE — Assessment & Plan Note (Signed)
Recurrent cough since teenager really bad and daily since Aug 2018 12/26/2016  rec symbicort 80 2bid then return for full pfts - PFT's  02/06/2017  FEV1 1.37 (80 % ) ratio 67  p 5 % improvement from saba p symb 80  prior to study with DLCO  66/66c % corrects to 79  % for alv volume   - Spirometry 10/21/2017  FEV1 1.18 (70%)  Ratio 71 with min curvature during attack of cough/ mostly pseudowheeze on exam on symb 80 x2  - FENO 10/21/2017  =   7  On symb 80 x 2  - 11/04/2017     90% with spacer  - singulair added 11/04/2017  But not taking consistently as of 12/04/2017 > rec restart daily  X one week and stopped it due "dizzy"  - PFT's  12/04/2017  FEV1 1.34 (85 % ) ratio 67  p 20 % improvement from saba p nothing prior to study with DLCO  71 % corrects to 80  % for alv volume   - Allergy profile 12/04/2017 >  Eos 0.0 /  IgE  546 dust only  - cyclical cough rx 7/86/7672  - Spirometry 03/26/2018  FEV1 1.1 (73%)  Ratio 0.64 - 03/26/2018  After extensive coaching inhaler device,  effectiveness =    90% s spacer  - Gabapentin 100 mg tid trial 03/26/2018> attempted rx by non-adherent  - Sinus CT 04/07/2018 > Negative study.  No evidence of paranasal sinusitis. - Spirometry 04/08/2018  FEV1 0.7 (47%)  Ratio 0.52 with convex curvature p am symb 80 x 2 and neb in office   - 04/08/2018  After extensive coaching inhaler device,  effectiveness =    75% with smi > added trial of spiriva 2pffs daily x 2 weeks then return  - CTa 04/11/18 for hemoptysis with refractory cough > Limited study due to respiratory motion. No obvious pulmonary embolism or other acute findings. - 04/20/2018  After extensive coaching inhaler device,  effectiveness =    0% with spiriva smi which caused immediate severe cough so rec d/c spiriva  - 04/20/2018 rechallenge with gabapentin 100 mg bid  - 05/04/2018  After extensive coaching inhaler device,  effectiveness =    90% with spacer - s spacer starts coughing immediately  - Spirometry 05/04/2018  FEV1  1.2 (75%)  Ratio 0.65 with mild curvature p symb 80 x 2 puffs   - 05/04/2018 increased gabapentin to 100 tid   - 05/25/2018 increased gabapentin to  100 mg qid > improved  - 07/23/2018 try ppi just in am  07/23/2018  After extensive coaching inhaler device,  effectiveness =    90% with spacer from baseline 75%  - 09/01/2018 changed to dulera 100 due to cost   Finally  All goals of chronic asthma control met including optimal function and elimination of symptoms with minimal need for rescue therapy.  Contingencies discussed in full including contacting this office immediately if not controlling the symptoms using the rule of two's.     Advised to go back to ppi bid at first sign of flare of resp symptoms of any type.

## 2019-01-14 NOTE — Progress Notes (Signed)
LMTCB

## 2019-01-14 NOTE — Telephone Encounter (Signed)
Call returned to patient, confirmed DOB, requesting results of CXR:  Notes recorded by Tanda Rockers, MD on 01/14/2019 at 5:56 AM EST  Call pt: Reviewed cxr and there is a small area on R where probably had a pneumonia in past but don't see it clearly on previous records so rec CT in 6 weeks (no contrast) to be sure about it - ok to do before end of year if insurance deductible in 2021 is a factor.   Made aware of results. Aware we are placing order for C w/o contrast in 6 weeks. Voiced understanding.   Nothing further needed at this time.

## 2019-01-20 ENCOUNTER — Ambulatory Visit: Payer: BC Managed Care – PPO | Admitting: Internal Medicine

## 2019-02-08 DIAGNOSIS — Z20828 Contact with and (suspected) exposure to other viral communicable diseases: Secondary | ICD-10-CM | POA: Diagnosis not present

## 2019-02-08 DIAGNOSIS — M130 Polyarthritis, unspecified: Secondary | ICD-10-CM | POA: Diagnosis not present

## 2019-02-09 ENCOUNTER — Other Ambulatory Visit: Payer: Self-pay

## 2019-02-09 ENCOUNTER — Encounter: Payer: Self-pay | Admitting: Family

## 2019-02-09 ENCOUNTER — Ambulatory Visit: Payer: BC Managed Care – PPO | Admitting: Family

## 2019-02-09 ENCOUNTER — Ambulatory Visit (INDEPENDENT_AMBULATORY_CARE_PROVIDER_SITE_OTHER): Payer: BC Managed Care – PPO

## 2019-02-09 ENCOUNTER — Ambulatory Visit: Payer: Self-pay

## 2019-02-09 VITALS — Ht 58.5 in | Wt 175.8 lb

## 2019-02-09 DIAGNOSIS — M546 Pain in thoracic spine: Secondary | ICD-10-CM

## 2019-02-09 DIAGNOSIS — M542 Cervicalgia: Secondary | ICD-10-CM

## 2019-02-09 DIAGNOSIS — M25512 Pain in left shoulder: Secondary | ICD-10-CM

## 2019-02-09 DIAGNOSIS — G8929 Other chronic pain: Secondary | ICD-10-CM

## 2019-02-09 MED ORDER — PREDNISONE 50 MG PO TABS: ORAL_TABLET | ORAL | 0 refills | Status: DC

## 2019-02-09 NOTE — Progress Notes (Signed)
Office Visit Note   Patient: Sue Mitchell           Date of Birth: Feb 19, 1956           MRN: [id number] Visit Date: 02/09/2019              Requested by: Lucianne Lei, MD [address] [place],  [place] [address] PCP: Lucianne Lei, MD  Chief Complaint  Patient presents with  . Neck - Pain  . Left Shoulder - Pain      HPI: The patient is a 63 year old woman who presents today for initial evaluation of left shoulder and left-sided neck pain.  She is also having some left upper back pain she reports that she has had knots in the muscles around her scapula in the past.  She is having pain that travels up and down her thoracic spine into her neck and radiates out to her shoulder down to her elbow.  Aching throbbing pain in her upper arm this is worse at night she has difficulty sleeping due to pain.  She is having some numbness and tingling in her left hand predominantly in her thumb and first finger.  She has not had any associated injuries. No relief with heat or robaxin  Assessment & Plan: Visit Diagnoses:  1. Cervicalgia   2. Left shoulder pain, unspecified chronicity   3. Chronic left-sided thoracic back pain     Plan: left shoulder will trial prednisone taper, PT referral. Will have him follow up in office in 2-3 weeks. May consider cervical mri at follow up.  Follow-Up Instructions: No follow-ups on file.   Back Exam   Tenderness  The patient is experiencing no tenderness.   Range of Motion  The patient has normal back ROM.  Muscle Strength  The patient has normal back strength.  Comments:  Paraspinal tenderness left neck, negative spurlings   Left Hand Exam   Tenderness  The patient is experiencing no tenderness.   Range of Motion  The patient has normal left wrist ROM.  Muscle Strength  The patient has normal left wrist strength.  Tests  Phalen's Sign: negative Tinel's sign (median nerve): negative  Other  Erythema: absent Sensation:  normal   Left Shoulder Exam   Tenderness  The patient is experiencing no tenderness.   Range of Motion  The patient has normal left shoulder ROM.  Tests  Impingement: negative Drop arm: negative  Other  Pulse: present       Patient is alert, oriented, no adenopathy, well-dressed, normal affect, normal respiratory effort.   Imaging: No results found. No images are attached to the encounter.  Labs: Lab Results  Component Value Date   REPTSTATUS 06/11/2013 FINAL 06/10/2013   CULT NO GROWTH Performed at [facility] 06/10/2013     Lab Results  Component Value Date   ALBUMIN 3.5 04/12/2018   ALBUMIN 4.5 12/13/2010    No results found for: MG No results found for: VD25OH  No results found for: PREALBUMIN CBC EXTENDED Latest Ref Rng & Units 04/14/2018 04/14/2018 04/13/2018  WBC 4.0 - 10.5 K/uL 11.5(H) 12.0(H) 11.9(H)  RBC 3.87 - 5.11 MIL/uL 4.12 3.80(L) 3.70(L)  HGB 12.0 - 15.0 g/dL 12.3 11.4(L) 10.8(L)  HCT 36.0 - 46.0 % 39.0 36.3 35.3(L)  PLT 150 - 400 K/uL 242 209 176  NEUTROABS 1.7 - 7.7 K/uL 7.4 - -  LYMPHSABS 0.7 - 4.0 K/uL 2.8 - -     Body mass index is  36.12 kg/m.  Orders:  Orders Placed This Encounter  Procedures  . XR Shoulder Left  . XR Cervical Spine 2 or 3 views   No orders of the defined types were placed in this encounter.    Procedures: No procedures performed  Clinical Data: No additional findings.  ROS:  All other systems negative, except as noted in the HPI. Review of Systems  Objective: Vital Signs: Ht 4' 10.5" (1.486 m)   Wt 175 lb 12.8 oz (79.7 kg)   BMI 36.12 kg/m   Specialty Comments:  No specialty comments available.  PMFS History: Patient Active Problem List   Diagnosis Date Noted  . Shoulder pain, left 01/14/2019  . Pulmonary infiltrate 01/14/2019  . Acute respiratory failure with hypoxia (HCC) 04/14/2018  . Hemoptysis 04/11/2018  . Influenza 04/11/2018  . Cough 04/11/2018  . Acute maxillary  sinusitis 02/20/2018  . Rhinitis, chronic 12/04/2017  . Cough variant asthma  vs uacs with vcd 12/26/2016   Past Medical History:  Diagnosis Date  . Abdominal distension   . Abdominal pain   . Arthritis   . Asthma   . Biliary colic   . COPD (chronic obstructive pulmonary disease) (HCC)   . Diarrhea   . GERD (gastroesophageal reflux disease)   . Hypertension   . Nausea     Family History  Problem Relation Age of Onset  . Diabetes Father     Past Surgical History:  Procedure Laterality Date  . cartilage removal  2007   left knee  . CESAREAN SECTION  1976  . LAPAROSCOPY  1992 (approx)    abdominal   Social History   Occupational History  . Not on file  Tobacco Use  . Smoking status: Former Smoker    Packs/day: 0.50    Years: 25.00    Pack years: 12.50    Types: Cigarettes    Quit date: 2011    Years since quitting: 9.9  . Smokeless tobacco: Never Used  Substance and Sexual Activity  . Alcohol use: Yes    Comment: occasional 2 per month  . Drug use: No  . Sexual activity: Yes

## 2019-02-12 ENCOUNTER — Telehealth: Payer: Self-pay | Admitting: Radiology

## 2019-02-12 NOTE — Telephone Encounter (Signed)
Patient called wanting results of left shoulder and cervical xrays from 12/15 visit. Only saw where may consider cervical MRI at follow up visit.  Please callback [phone]

## 2019-02-15 ENCOUNTER — Ambulatory Visit (INDEPENDENT_AMBULATORY_CARE_PROVIDER_SITE_OTHER)
Admission: RE | Admit: 2019-02-15 | Discharge: 2019-02-15 | Disposition: A | Discharge status: Home / Self Care | Payer: BC Managed Care – PPO | Source: Ambulatory Visit | Attending: Internal Medicine | Admitting: Internal Medicine

## 2019-02-15 ENCOUNTER — Other Ambulatory Visit: Payer: Self-pay

## 2019-02-15 DIAGNOSIS — R918 Other nonspecific abnormal finding of lung field: Secondary | ICD-10-CM

## 2019-02-15 DIAGNOSIS — J439 Emphysema, unspecified: Secondary | ICD-10-CM | POA: Diagnosis not present

## 2019-02-17 ENCOUNTER — Telehealth: Payer: Self-pay | Admitting: Internal Medicine

## 2019-02-17 NOTE — Telephone Encounter (Signed)
Call patient : Study is unremarkable, no change in recs  Attempted to call pt but unable to reach. Left message for pt to return call.

## 2019-02-18 NOTE — Telephone Encounter (Signed)
Attempted to call pt but unable to reach and unable to leave a VM. I was able to leave a VM yesterday 12/23 for pt to return call.

## 2019-02-18 NOTE — Telephone Encounter (Signed)
Returning call CB# [phone]

## 2019-02-22 ENCOUNTER — Telehealth: Payer: Self-pay | Admitting: Internal Medicine

## 2019-02-22 MED ORDER — ALBUTEROL SULFATE HFA 108 (90 BASE) MCG/ACT IN AERS: 2.0000 | INHALATION_SPRAY | Freq: Four times a day (QID) | RESPIRATORY_TRACT | 5 refills | Status: DC | PRN

## 2019-02-22 MED ORDER — MOMETASONE FURO-FORMOTEROL FUM 100-5 MCG/ACT IN AERO: INHALATION_SPRAY | RESPIRATORY_TRACT | 3 refills | Status: DC

## 2019-02-22 MED ORDER — BREZTRI AEROSPHERE 160-9-4.8 MCG/ACT IN AERO: 2.0000 | INHALATION_SPRAY | Freq: Two times a day (BID) | RESPIRATORY_TRACT | 0 refills | Status: DC

## 2019-02-22 NOTE — Telephone Encounter (Signed)
Spoke with patient. She stated that she contacted [facility] to get a refill on her Ruthe Mannan but was advised by them that she will need to get the refills from [place]. Advised her that I would go ahead and send in the refill for her. I will also send in a refill on her albuterol as well.   She also stated that she is completely out of her [facility]. She wanted to know if we had samples of either Dulera or Symbicort to last until she can get her RX.  I advised her that we did not. Did advise her that I would check with MW to see if we could give a sample of another medication to hold her until she receives her shipment. She verbalized understanding.   MW, please advise. Thanks!

## 2019-02-22 NOTE — Telephone Encounter (Signed)
Can give BREZTRI since it has symbicort in it

## 2019-02-22 NOTE — Telephone Encounter (Signed)
1 sample up front for pick up  Spoke with pt and notified  Nothing further needed

## 2019-02-22 NOTE — Telephone Encounter (Signed)
Spoke with patient. She verbalized understanding. Nothing further needed.  ?

## 2019-03-01 ENCOUNTER — Other Ambulatory Visit: Payer: Self-pay | Admitting: Internal Medicine

## 2019-03-01 MED ORDER — PANTOPRAZOLE SODIUM 40 MG PO TBEC: 40.0000 mg | DELAYED_RELEASE_TABLET | Freq: Two times a day (BID) | ORAL | 5 refills | Status: DC

## 2019-03-02 ENCOUNTER — Ambulatory Visit: Payer: BC Managed Care – PPO | Admitting: Orthopedic Surgery

## 2019-03-23 ENCOUNTER — Encounter: Payer: Self-pay | Admitting: Adult Health

## 2019-03-23 ENCOUNTER — Other Ambulatory Visit: Payer: Self-pay

## 2019-03-23 ENCOUNTER — Ambulatory Visit (INDEPENDENT_AMBULATORY_CARE_PROVIDER_SITE_OTHER): Payer: BC Managed Care – PPO | Admitting: Adult Health

## 2019-03-23 DIAGNOSIS — J310 Chronic rhinitis: Secondary | ICD-10-CM | POA: Diagnosis not present

## 2019-03-23 DIAGNOSIS — J4541 Moderate persistent asthma with (acute) exacerbation: Secondary | ICD-10-CM

## 2019-03-23 MED ORDER — MOMETASONE FURO-FORMOTEROL FUM 200-5 MCG/ACT IN AERO: 2.0000 | INHALATION_SPRAY | Freq: Two times a day (BID) | RESPIRATORY_TRACT | 5 refills | Status: DC

## 2019-03-23 MED ORDER — PREDNISONE 10 MG PO TABS: ORAL_TABLET | ORAL | 0 refills | Status: DC

## 2019-03-23 MED ORDER — ALBUTEROL SULFATE (2.5 MG/3ML) 0.083% IN NEBU: 2.5000 mg | INHALATION_SOLUTION | Freq: Four times a day (QID) | RESPIRATORY_TRACT | 5 refills | Status: DC | PRN

## 2019-03-23 MED ORDER — BENZONATATE 200 MG PO CAPS: 200.0000 mg | ORAL_CAPSULE | Freq: Three times a day (TID) | ORAL | 1 refills | Status: DC | PRN

## 2019-03-23 NOTE — Patient Instructions (Addendum)
Prednisone taper -to have on hold if symptoms worsen with ongoing wheezing .  Begin Zyrtec 10mg  At bedtime.  Begin Flonase 2 puffs daily .  Increase Dulera 200  2 puffs twice daily , rinse after use.  Albuterol Inhaler 1-2 puffs every 4-6 hr as needed. Or  Add Albuterol Nebulizer every 6hr as needed.  Delsym 2 tsp Twice daily for cough As needed   Tessalon Three times a day  For cough As needed   Continue on Neurontin 100mg  Four times a day  .  Follow up with Dr.  In 4 weeks and As needed   Please contact office for sooner follow up if symptoms do not improve or worsen or seek emergency care

## 2019-03-23 NOTE — Progress Notes (Signed)
Virtual Visit via Telephone Note  I connected with Sue Mitchell on 03/23/19 at 10:30 AM EST by telephone and verified that I am speaking with the correct person using two identifiers.  Location: Patient: Home  Provider: Office    I discussed the limitations, risks, security and privacy concerns of performing an evaluation and management service by telephone and the availability of in person appointments. I also discussed with the patient that there may be a patient responsible charge related to this service. The patient expressed understanding and agreed to proceed.   History of Present Illness: 64 year old former smoker followed for moderate persistent asthma  Today's televisit is an acute office visit for asthma.  Patient complains over the last month that she has had persistent cough and wheezing.  Says that symptoms started around the holidays with dry cough that is minimally productive and intermittent wheezing. She is on Dulera 100 that she uses twice a day and says that she is compliant.  She does use her Ventolin on occasion.  Does have some sinus drainage. She has no fever no loss of taste or smell.  No known sick contacts however she does work in the medical field as a respiratory therapist She says she was tested for COVID-19 after symptoms started and was negative. She says she has used albuterol nebulizer in the past however does not have any albuterol medication. Appetite is good with no nausea vomiting or diarrhea. She is using gabapentin 100 mg 4 times a day for chronic cough.  Says she has tried Delsym and her Tessalon in the past without much relief.  Patient Active Problem List   Diagnosis Date Noted  . Shoulder pain, left 01/14/2019  . Pulmonary infiltrate 01/14/2019  . Acute respiratory failure with hypoxia (HCC) 04/14/2018  . Hemoptysis 04/11/2018  . Influenza 04/11/2018  . Cough 04/11/2018  . Acute maxillary sinusitis 02/20/2018  . Rhinitis, chronic  12/04/2017  . Cough variant asthma  vs uacs with vcd 12/26/2016   Current Outpatient Medications on File Prior to Visit  Medication Sig Dispense Refill  . acyclovir (ZOVIRAX) 200 MG capsule Take 200 mg by mouth 2 (two) times daily as needed (outbreaks).     Marland Kitchen albuterol (VENTOLIN HFA) 108 (90 Base) MCG/ACT inhaler Inhale 2 puffs into the lungs every 6 (six) hours as needed for wheezing or shortness of breath. 18 g 5  . amLODipine (NORVASC) 5 MG tablet Take 1 tablet (5 mg total) by mouth daily. 30 tablet 3  . gabapentin (NEURONTIN) 100 MG capsule Take 1 capsule (100 mg total) by mouth 4 (four) times daily. 120 capsule 11  . hydrochlorothiazide (MICROZIDE) 12.5 MG capsule Take 1 capsule (12.5 mg total) by mouth daily. 30 capsule 3  . mometasone-formoterol (DULERA) 100-5 MCG/ACT AERO Take 2 puffs first thing in am and then another 2 puffs about 12 hours later. 39 g 3  . pantoprazole (PROTONIX) 40 MG tablet Take 1 tablet (40 mg total) by mouth 2 (two) times daily before a meal. 60 tablet 5  . valsartan (DIOVAN) 40 MG tablet Take 1 tablet by mouth daily.    . methocarbamol (ROBAXIN-750) 750 MG tablet Take 1 tablet (750 mg total) by mouth 4 (four) times daily. (Patient not taking: Reported on 03/23/2019) 40 tablet 2   No current facility-administered medications on file prior to visit.      Observations/Objective: CBC with differential October 2019 eosinophils , IgE 546  PFT's  02/06/2017  FEV1 1.37 (80 % ) ratio 67  p 5 % improvement from saba p symb 80  prior to study with DLCO  66/66c % corrects to 79  % for alv volume   - Spirometry 10/21/2017  FEV1 1.18 (70%)  Ratio 71 with min curvature during attack of cough/ mostly pseudowheeze on exam on symb 80 x2  - FENO 10/21/2017  =   7  On symb 80 x 2  - 11/04/2017     90% with spacer  - singulair added 11/04/2017  But not taking consistently as of 12/04/2017 > rec restart daily  X one week and stopped it due "dizzy"  - PFT's  12/04/2017  FEV1 1.34 (85 %  ) ratio 67  p 20 % improvement from saba p nothing prior to study with DLCO  71 % corrects to 80  % for alv volume   - Allergy profile 12/04/2017 >  Eos 0.0 /  IgE  546 dust only  - cyclical cough rx 0/35/0093  - Spirometry 03/26/2018  FEV1 1.1 (73%)  Ratio 0.64 Spirometry 04/08/2018  FEV1 0.7 (47%)  Ratio 0.52 with convex curvature p am symb 80 x 2 and neb in office     Assessment and Plan: Slow to resolve asthma exacerbation-  Previously intolerant to Singulair  Steroid taper to have on hold - try albuterol nebs and allergy meds .  Increase ICS .  If not improving on return refer to allergist for consideration of biologics  Plan  Patient Instructions  Prednisone taper -to have on hold if symptoms worsen with ongoing wheezing .  Begin Zyrtec 10mg  At bedtime.  Begin Flonase 2 puffs daily .  Increase Dulera 200  2 puffs twice daily , rinse after use.  Albuterol Inhaler 1-2 puffs every 4-6 hr as needed. Or  Add Albuterol Nebulizer every 6hr as needed.  Delsym 2 tsp Twice daily for cough As needed   Tessalon Three times a day  For cough As needed   Continue on Neurontin 100mg  Four times a day  .  Follow up with Dr. Melvyn Novas  In 4 weeks and As needed   Please contact office for sooner follow up if symptoms do not improve or worsen or seek emergency care          Follow Up Instructions: Follow up in 4 weeks and As needed   Please contact office for sooner follow up if symptoms do not improve or worsen or seek emergency care     I discussed the assessment and treatment plan with the patient. The patient was provided an opportunity to ask questions and all were answered. The patient agreed with the plan and demonstrated an understanding of the instructions.   The patient was advised to call back or seek an in-person evaluation if the symptoms worsen or if the condition fails to improve as anticipated.  I provided 22 minutes of non-face-to-face time during this encounter.   Rexene Edison, NP

## 2019-04-01 ENCOUNTER — Encounter (HOSPITAL_COMMUNITY): Payer: Self-pay | Admitting: Emergency Medicine

## 2019-04-01 ENCOUNTER — Emergency Department (HOSPITAL_COMMUNITY)
Admission: EM | Admit: 2019-04-01 | Discharge: 2019-04-01 | Disposition: A | Discharge status: Home / Self Care | Payer: BC Managed Care – PPO | Attending: Emergency Medicine | Admitting: Emergency Medicine

## 2019-04-01 ENCOUNTER — Other Ambulatory Visit: Payer: Self-pay

## 2019-04-01 DIAGNOSIS — M25512 Pain in left shoulder: Secondary | ICD-10-CM | POA: Diagnosis not present

## 2019-04-01 DIAGNOSIS — Z79899 Other long term (current) drug therapy: Secondary | ICD-10-CM | POA: Diagnosis not present

## 2019-04-01 DIAGNOSIS — I10 Essential (primary) hypertension: Secondary | ICD-10-CM | POA: Insufficient documentation

## 2019-04-01 DIAGNOSIS — J449 Chronic obstructive pulmonary disease, unspecified: Secondary | ICD-10-CM | POA: Diagnosis not present

## 2019-04-01 DIAGNOSIS — J45909 Unspecified asthma, uncomplicated: Secondary | ICD-10-CM | POA: Diagnosis not present

## 2019-04-01 DIAGNOSIS — Z87891 Personal history of nicotine dependence: Secondary | ICD-10-CM | POA: Diagnosis not present

## 2019-04-01 DIAGNOSIS — M62838 Other muscle spasm: Secondary | ICD-10-CM | POA: Diagnosis not present

## 2019-04-01 MED ORDER — KETOROLAC TROMETHAMINE 15 MG/ML IJ SOLN
15.0000 mg | Freq: Once | INTRAMUSCULAR | Status: AC
  Administered 2019-04-01: 15 mg via INTRAVENOUS
  Filled 2019-04-01: qty 1

## 2019-04-01 MED ORDER — BUPIVACAINE HCL (PF) 0.5 % IJ SOLN
10.0000 mL | Freq: Once | INTRAMUSCULAR | Status: AC
  Administered 2019-04-01: 10 mL
  Filled 2019-04-01: qty 30

## 2019-04-01 NOTE — ED Provider Notes (Signed)
[facility] Provider Note  CSN: [id number] Arrival date & time: 04/01/19 0307  Chief Complaint(s) Shoulder Pain  HPI Sue Mitchell is a 64 y.o. female who presents to the emergency department with 1 to 2 months of left shoulder/upper back pain that has been constant since onset and fluctuating in intensity.  Pain is worse with movement and certain positions.  Patient was seen by orthopedic surgery who obtained plain films noting degenerative disc disease.  Patient has tried using Biofreeze and heat/cold therapy with mild improvement.  Patient also taken prescribed NSAIDs which also provide mild improvement.  She denies any overt chest pain or shortness of breath.  No fevers or chills.  Denies any other physical complaints.  HPI  Past Medical History Past Medical History:  Diagnosis Date  . Abdominal distension   . Abdominal pain   . Arthritis   . Asthma   . Biliary colic   . COPD (chronic obstructive pulmonary disease) (HCC)   . Diarrhea   . GERD (gastroesophageal reflux disease)   . Hypertension   . Nausea    Patient Active Problem List   Diagnosis Date Noted  . Shoulder pain, left 01/14/2019  . Pulmonary infiltrate 01/14/2019  . Acute respiratory failure with hypoxia (HCC) 04/14/2018  . Hemoptysis 04/11/2018  . Influenza 04/11/2018  . Cough 04/11/2018  . Acute maxillary sinusitis 02/20/2018  . Rhinitis, chronic 12/04/2017  . Cough variant asthma  vs uacs with vcd 12/26/2016   Home Medication(s) Prior to Admission medications   Medication Sig Start Date End Date Taking? Authorizing Provider  acyclovir (ZOVIRAX) 200 MG capsule Take 200 mg by mouth 2 (two) times daily as needed (outbreaks).  01/22/11   [provider]  albuterol (PROVENTIL) (2.5 MG/3ML) 0.083% nebulizer solution Take 3 mLs (2.5 mg total) by nebulization every 6 (six) hours as needed for wheezing or shortness of breath (dx J45.991). 03/23/19   [facility], Virgel Bouquet, NP   albuterol (VENTOLIN HFA) 108 (90 Base) MCG/ACT inhaler Inhale 2 puffs into the lungs every 6 (six) hours as needed for wheezing or shortness of breath. 02/22/19   Nyoka Cowden, MD  amLODipine (NORVASC) 5 MG tablet Take 1 tablet (5 mg total) by mouth daily. 04/15/18   Rai, Ripudeep Kirtland Bouchard, MD  benzonatate (TESSALON) 200 MG capsule Take 1 capsule (200 mg total) by mouth 3 (three) times daily as needed for cough. 03/23/19 03/22/20  [facility], Virgel Bouquet, NP  gabapentin (NEURONTIN) 100 MG capsule Take 1 capsule (100 mg total) by mouth 4 (four) times daily. 05/25/18   Nyoka Cowden, MD  hydrochlorothiazide (MICROZIDE) 12.5 MG capsule Take 1 capsule (12.5 mg total) by mouth daily. 04/15/18   Rai, Delene Ruffini, MD  methocarbamol (ROBAXIN-750) 750 MG tablet Take 1 tablet (750 mg total) by mouth 4 (four) times daily. Patient not taking: Reported on 03/23/2019 01/13/19   Nyoka Cowden, MD  mometasone-formoterol [facility]) 200-5 MCG/ACT AERO Inhale 2 puffs into the lungs 2 (two) times daily. 03/23/19   [facility], Virgel Bouquet, NP  pantoprazole (PROTONIX) 40 MG tablet Take 1 tablet (40 mg total) by mouth 2 (two) times daily before a meal. 03/01/19   Nyoka Cowden, MD  predniSONE (DELTASONE) 10 MG tablet 4 tabs for 2 days, then 3 tabs for 2 days, 2 tabs for 2 days, then 1 tab for 2 days, then stop 03/23/19   [facility], Tammy S, NP  valsartan (DIOVAN) 40 MG tablet Take 1 tablet by mouth daily. 08/19/18  [provider]                                                                                                                                    Past Surgical History Past Surgical History:  Procedure Laterality Date  . cartilage removal  2007   left knee  . CESAREAN SECTION  1976  . LAPAROSCOPY  1992 (approx)    abdominal   Family History Family History  Problem Relation Age of Onset  . Diabetes Father     Social History Social History   Tobacco Use  . Smoking status: Former Smoker    Packs/day: 0.50     Years: 25.00    Pack years: 12.50    Types: Cigarettes    Quit date: 2011    Years since quitting: 10.1  . Smokeless tobacco: Never Used  Substance Use Topics  . Alcohol use: Yes    Comment: occasional 2 per month  . Drug use: No   Allergies Codeine and Ciprofloxacin  Review of Systems Review of Systems All other systems are reviewed and are negative for acute change except as noted in the HPI  Physical Exam Vital Signs  I have reviewed the triage vital signs BP (!) 147/98   Pulse 73   Temp 98.1 F (36.7 C) (Oral)   Resp 16   Ht 4\' 11"  (1.499 m)   Wt 77.6 kg   SpO2 99%   BMI 34.54 kg/m   Physical Exam Vitals reviewed.  Constitutional:      General: She is not in acute distress.    Appearance: She is well-developed. She is not diaphoretic.  HENT:     Head: Normocephalic and atraumatic.     Nose: Nose normal.  Eyes:     General: No scleral icterus.       Right eye: No discharge.        Left eye: No discharge.     Conjunctiva/sclera: Conjunctivae normal.     Pupils: Pupils are equal, round, and reactive to light.  Cardiovascular:     Rate and Rhythm: Normal rate and regular rhythm.     Heart sounds: No murmur. No friction rub. No gallop.   Pulmonary:     Effort: Pulmonary effort is normal. No respiratory distress.     Breath sounds: Normal breath sounds. No stridor. No rales.  Abdominal:     General: There is no distension.     Palpations: Abdomen is soft.     Tenderness: There is no abdominal tenderness.  Musculoskeletal:     Cervical back: Normal range of motion and neck supple. Spasms and tenderness present. No bony tenderness.     Thoracic back: Spasms and tenderness present. No bony tenderness.       Back:  Skin:    General: Skin is warm and dry.     Findings: No erythema or rash.  Neurological:  Mental Status: She is alert and oriented to person, place, and time.     ED Results and Treatments Labs (all labs ordered are listed, but only  abnormal results are displayed) Labs Reviewed - No data to display                                                                                                                       EKG  EKG Interpretation  Date/Time:    Ventricular Rate:    PR Interval:    QRS Duration:   QT Interval:    QTC Calculation:   R Axis:     Text Interpretation:        Radiology No results found.  Pertinent labs & imaging results that were available during my care of the patient were reviewed by me and considered in my medical decision making (see chart for details).  Medications Ordered in ED Medications  ketorolac (TORADOL) 15 MG/ML injection 15 mg (15 mg Intravenous Given 04/01/19 0500)  bupivacaine (MARCAINE) 0.5 % injection 10 mL (10 mLs Infiltration Given 04/01/19 0500)                                                                                                                                    Procedures Procedures  Procedure Note: Trigger Point Injection for Myofascial pain  Performed by Dr. Eudelia Bunch Indication: muscle/myofascial pain Muscle body and tendon sheath of the left trapezius and rhomboid muscle(s) were injected with 0.5% bupivacaine under sterile technique for release of muscle spasm/pain. Patient tolerated well with immediate improvement of symptoms and no immediate complications following procedure.  CPT Code:   3 or more: 86761   (including critical care time)  Medical Decision Making / ED Course I have reviewed the nursing notes for this encounter and the patient's prior records (if available in EHR or on provided paperwork).   Fritzie Prioleau was evaluated in Emergency Department on 04/01/2019 for the symptoms described in the history of present illness. She was evaluated in the context of the global COVID-19 pandemic, which necessitated consideration that the patient might be at risk for infection with the SARS-CoV-2 virus that causes COVID-19. Institutional protocols  and algorithms that pertain to the evaluation of patients at risk for COVID-19 are in a state of rapid change based on information released by regulatory bodies including the CDC and federal and state organizations. These policies and algorithms were  followed during the patient's care in the ED.  Presentation is consistent with muscle strain/spasm of the left shoulder girdle muscle group.  Lungs clear to auscultation bilaterally.  No chest pain concerning for cardiac etiology.  Doubt PE.  Pain improved with IV Toradol.  Patient also provided with trigger point injections.  No indication for imaging emergently. Patient was recommended to take short course of scheduled NSAIDs and engage in early mobility as definitive treatment. Return precautions discussed for worsening or new concerning symptoms.        Final Clinical Impression(s) / ED Diagnoses Final diagnoses:  Muscle spasm of shoulder region    The patient appears reasonably screened and/or stabilized for discharge and I doubt any other medical condition or other [facility] requiring further screening, evaluation, or treatment in the ED at this time prior to discharge.  Disposition: Discharge  Condition: Good  I have discussed the results, Dx and Tx plan with the patient who expressed understanding and agree(s) with the plan. Discharge instructions discussed at great length. The patient was given strict return precautions who verbalized understanding of the instructions. No further questions at time of discharge.    ED Discharge Orders    None      Follow Up: Renaye Rakers, MD [address] [place] [address] [phone]  Schedule an appointment as soon as possible for a visit  As needed      This chart was dictated using voice recognition software.  Despite best efforts to proofread,  errors can occur which can change the documentation meaning.   Nira Conn, MD 04/01/19 [phone]

## 2019-04-01 NOTE — Discharge Instructions (Addendum)
You may use over-the-counter Motrin (Ibuprofen), Acetaminophen (Tylenol), topical muscle creams such as SalonPas, Icy Hot, Bengay, etc. Please stretch, apply heat, and have massage therapy for additional assistance. ° °

## 2019-04-02 DIAGNOSIS — M130 Polyarthritis, unspecified: Secondary | ICD-10-CM | POA: Diagnosis not present

## 2019-04-05 ENCOUNTER — Encounter: Payer: Self-pay | Admitting: Physical Therapy

## 2019-04-05 ENCOUNTER — Ambulatory Visit: Payer: BC Managed Care – PPO | Attending: Family | Admitting: Physical Therapy

## 2019-04-05 ENCOUNTER — Other Ambulatory Visit: Payer: Self-pay

## 2019-04-05 DIAGNOSIS — M6281 Muscle weakness (generalized): Secondary | ICD-10-CM | POA: Diagnosis not present

## 2019-04-05 DIAGNOSIS — G8929 Other chronic pain: Secondary | ICD-10-CM

## 2019-04-05 DIAGNOSIS — M542 Cervicalgia: Secondary | ICD-10-CM

## 2019-04-05 DIAGNOSIS — M25512 Pain in left shoulder: Secondary | ICD-10-CM | POA: Diagnosis not present

## 2019-04-05 DIAGNOSIS — M546 Pain in thoracic spine: Secondary | ICD-10-CM | POA: Diagnosis not present

## 2019-04-05 NOTE — Therapy (Signed)
[place], [place], [address] Phone: [phone]   Fax:  [phone]  Physical Therapy Evaluation  Patient Details  Name: Sue Mitchell MRN: [id number] Date of Birth: 1955-10-08 Referring Provider (PT): Dondra Prader, NP   Encounter Date: 04/05/2019  PT End of Session - 04/05/19 1302    Visit Number  1    Number of Visits  13    Date for PT Re-Evaluation  06/03/19    PT Start Time  2353    PT Stop Time  1350    PT Time Calculation (min)  45 min    Activity Tolerance  Patient tolerated treatment well    Behavior During Therapy  [facility] for tasks assessed/performed       Past Medical History:  Diagnosis Date  . Abdominal distension   . Abdominal pain   . Arthritis   . Asthma   . Biliary colic   . COPD (chronic obstructive pulmonary disease) ([facility])   . Diarrhea   . GERD (gastroesophageal reflux disease)   . Hypertension   . Nausea     Past Surgical History:  Procedure Laterality Date  . cartilage removal  2007   left knee  . CESAREAN SECTION  1976  . LAPAROSCOPY  1992 (approx)    abdominal    There were no vitals filed for this visit.   Subjective Assessment - 04/05/19 1301    Subjective  Pt arriving to therapy reporting she feels like she is moving in slow motion due to pain. Pt reporting all this began about 1 month ago. Pt reporting 6-7/10 pain today in L shoulder, cervical spine and pt reporting radiation down L UE. Pt also reporting taking an advil  prior to therapy. Pt reporting diffictuly with ADL's like bending over to tie her shoes and put on her socks. Pt reporting increasing pain where it was so bad she went to the ER. Pt reporting the MD said she was having muscle spasms. Pt reporting increased stress in her life. Pt is a respiratory therapist at [employer].    Pertinent History  HTN, COPD, biliary colic, asthma, arthritis    Currently in Pain?  Yes    Pain Score  7      Pain Orientation  Left    Pain Descriptors / Indicators  Aching    Pain Type  Acute pain    Pain Onset  1 to 4 weeks ago    Pain Frequency  Constant    Aggravating Factors   lifting, working, bending over to put on my socks and shoes    Pain Relieving Factors  meds    Effect of Pain on Daily Activities  difficulty with putting on socks, shoes, standing all day at work.         [facility] PT Assessment - 04/05/19 0001      Assessment   Medical Diagnosis  cervicalgia, L shoulder pain, L thoracic pain    Referring Provider (PT)  Dondra Prader, NP    Hand Dominance  Right    Prior Therapy  no      Precautions   Precautions  None      Restrictions   Weight Bearing Restrictions  No      Balance Screen   Has the patient fallen in the past 6 months  No    Is the patient reluctant to leave their home because of a fear of falling?   No  Prior Function   Level of Independence  Independent    Vocation  Full time employment    Tour manager at [employer]   Overall Cognitive Status  Within Functional Limits for tasks assessed      Observation/Other Assessments   Focus on Therapeutic Outcomes (FOTO)   62 % limitation      ROM / Strength   AROM / PROM / Strength  AROM;Strength      AROM   AROM Assessment Site  Shoulder;Cervical    Right/Left Shoulder  Right;Left    Right Shoulder Extension  40 Degrees    Right Shoulder Flexion  164 Degrees   painful on the left side in end ranges   Right Shoulder ABduction  150 Degrees    Right Shoulder External Rotation  68 Degrees    Left Shoulder Extension  36 Degrees    Left Shoulder Flexion  168 Degrees    Left Shoulder ABduction  150 Degrees    Left Shoulder External Rotation  45 Degrees    Cervical Flexion  26    Cervical Extension  25   painful arch   Cervical - Right Side Bend  45    Cervical - Left Side Bend  30    Cervical - Right Rotation  70    Cervical - Left Rotation  74    painful/ tightness     Strength   Overall Strength Comments  R UE grossly 5/5, L UE painful with MMT    Strength Assessment Site  Shoulder    Right/Left Shoulder  Left    Left Shoulder Flexion  3/5    Left Shoulder Extension  3/5    Left Shoulder ABduction  3/5    Left Shoulder Internal Rotation  3/5    Left Shoulder External Rotation  3/5      Palpation   Palpation comment  TTP medial scapular border, left upper trap, Left infraspinatus,       Special Tests    Special Tests  Rotator Cuff Impingement    Rotator Cuff Impingment tests  Empty Can test      Empty Can test   Findings  Positive    Side  Left      Ambulation/Gait   Gait Comments  decreased arm swing noted on Left                Objective measurements completed on examination: See above findings.      [facility] Adult PT Treatment/Exercise - 04/05/19 0001      Exercises   Exercises  Neck;Shoulder      Neck Exercises: Supine   Neck Retraction  10 reps;5 secs      Shoulder Exercises: Seated   Other Seated Exercises  --      Manual Therapy   Manual Therapy  Joint mobilization;Soft tissue mobilization    Manual therapy comments  5 minutes    Joint Mobilization  occipital release    Soft tissue mobilization  cervical paraspinals      Neck Exercises: Stretches   Upper Trapezius Stretch  3 reps;10 seconds    Corner Stretch  3 reps;10 seconds                  PT Long Term Goals - 04/05/19 1302      PT LONG TERM GOAL #1   Title  Pt will be independent in her HEP and progression.  Time  6    Period  Weeks    Status  New    Target Date  05/17/19      PT LONG TERM GOAL #2   Title  Pt will improve her FOTO to </= 36% limitation.    Baseline  62% Limitation at eval    Time  6    Period  Weeks    Status  New    Target Date  05/17/19      PT LONG TERM GOAL #3   Title  Pt will be able to report improved sleeping by >/= 50 %.    Baseline  pt reporting waking up nightly due to pain  and having diffculty sleeping    Time  6    Period  Weeks    Status  New    Target Date  05/17/19      PT LONG TERM GOAL #4   Title  Pt will improve her Left sholder external rotation to >/= 65 degrees.    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 Pt reporting difficutly with sleeping at night and reports she sleeps on her left side. Pt reporting pain radiation down her left arm. Pt with decreased strength and painful ROM. Pt tender to palpation along medial scapular border, left upper trap and left infraspinatus. Pt with active trigger points noted in left upper trap and levator. Pt was issued a HEP. Recommendation for DN at next session.  Pt would benefit from [facility] PT to progress toward her PLOF.    Personal Factors and Comorbidities  Comorbidity 3+    Comorbidities  HTH, COPD, asthma, arthritis    Examination-Activity Limitations  Reach Overhead;Carry;Lift;Sleep    Examination-Participation Restrictions  Community Activity;Other    Stability/Clinical Decision Making  Stable/Uncomplicated    Clinical Decision Making  Low    Rehab Potential  Good    PT Frequency  2x / week    PT Duration  6 weeks    PT Treatment/Interventions  Cryotherapy;Electrical Stimulation;Iontophoresis 4mg /ml Dexamethasone;Moist Heat;Ultrasound;Functional mobility training;Stair training;Gait training;Therapeutic activities;Therapeutic exercise;Neuromuscular re-education;Patient/family education;Manual techniques;Taping;Dry needling;Passive range of motion    PT Next Visit Plan  DN assessment, test grip strength, cervical mobs, cervical retraction, postural exercises, STM and modalities as needed.    PT Home Exercise Plan  Access Code: [email or web address] (cervical retraction in supine,  door stretch 3 position, upper trap stretch, ER AAROM with cane)    Consulted and Agree with Plan of Care  Patient       Patient will benefit from skilled therapeutic intervention in order to improve the following deficits and impairments:  Pain, Postural dysfunction, Impaired UE functional use, Decreased strength, Decreased range of motion  Visit Diagnosis: Cervicalgia  Pain in thoracic spine  Muscle weakness (generalized)  Chronic left shoulder pain     Problem List Patient Active Problem List   Diagnosis Date Noted  . Shoulder pain, left 01/14/2019  . Pulmonary infiltrate 01/14/2019  . Acute respiratory failure with hypoxia (HCC) 04/14/2018  . Hemoptysis 04/11/2018  . Influenza 04/11/2018  . Cough 04/11/2018  . Acute maxillary sinusitis 02/20/2018  . Rhinitis,  chronic 12/04/2017  . Cough variant asthma  vs uacs with vcd 12/26/2016    Sharmon Leyden, PT 04/05/2019, 5:06 PM  [facility] [address] [place], [place], [address] Phone: [phone]   Fax:  [phone]  Name: Sue Mitchell MRN: [id number] Date of Birth: 15-Oct-1955

## 2019-04-13 DIAGNOSIS — M25519 Pain in unspecified shoulder: Secondary | ICD-10-CM | POA: Diagnosis not present

## 2019-04-19 ENCOUNTER — Other Ambulatory Visit: Payer: Self-pay

## 2019-04-19 ENCOUNTER — Encounter: Payer: Self-pay | Admitting: Physical Therapy

## 2019-04-19 ENCOUNTER — Ambulatory Visit: Payer: BC Managed Care – PPO | Admitting: Physical Therapy

## 2019-04-19 DIAGNOSIS — M25512 Pain in left shoulder: Secondary | ICD-10-CM | POA: Diagnosis not present

## 2019-04-19 DIAGNOSIS — M546 Pain in thoracic spine: Secondary | ICD-10-CM | POA: Diagnosis not present

## 2019-04-19 DIAGNOSIS — G8929 Other chronic pain: Secondary | ICD-10-CM

## 2019-04-19 DIAGNOSIS — M542 Cervicalgia: Secondary | ICD-10-CM | POA: Diagnosis not present

## 2019-04-19 DIAGNOSIS — M6281 Muscle weakness (generalized): Secondary | ICD-10-CM | POA: Diagnosis not present

## 2019-04-19 NOTE — Therapy (Addendum)
[facility] [address] [place], [place], [address] Phone: [phone]   Fax:  [phone]  Physical Therapy Treatment  Patient Details  Name: Sue Mitchell MRN: [id number] Date of Birth: 1955-07-31 Referring Provider (PT): Barnie Del, NP   Encounter Date: 04/19/2019  PT End of Session - 04/19/19 1423    Visit Number  2    Number of Visits  13    Date for PT Re-Evaluation  06/03/19    PT Start Time  1315    PT Stop Time  1410    PT Time Calculation (min)  55 min    Activity Tolerance  Patient tolerated treatment well    Behavior During Therapy  [facility] for tasks assessed/performed       Past Medical History:  Diagnosis Date  . Abdominal distension   . Abdominal pain   . Arthritis   . Asthma   . Biliary colic   . COPD (chronic obstructive pulmonary disease) (HCC)   . Diarrhea   . GERD (gastroesophageal reflux disease)   . Hypertension   . Nausea     Past Surgical History:  Procedure Laterality Date  . cartilage removal  2007   left knee  . CESAREAN SECTION  1976  . LAPAROSCOPY  1992 (approx)    abdominal    There were no vitals filed for this visit.  Subjective Assessment - 04/19/19 1319    Subjective  Pt arriving to therapy reporting 8/10 pain in her neck with difficulty turning her head to the left.    Pertinent History  HTN, COPD, biliary colic, asthma, arthritis    Currently in Pain?  Yes    Pain Score  8     Pain Location  Neck    Pain Orientation  Left    Pain Descriptors / Indicators  Aching    Pain Type  Acute pain    Pain Onset  1 to 4 weeks ago                       [facility] Adult PT Treatment/Exercise - 04/19/19 0001      Exercises   Exercises  Neck;Shoulder      Neck Exercises: Machines for Strengthening   UBE (Upper Arm Bike)  1.5 minutes forward and back      Neck Exercises: Theraband   Scapula Retraction  10 reps      Neck Exercises: Supine   Neck Retraction  5  reps;5 secs      Shoulder Exercises: Stretch   Other Shoulder Stretches  middle trap stretch in doorway x 3 holding 10 seconds      Modalities   Modalities  Moist Heat      Moist Heat Therapy   Number Minutes Moist Heat  10 Minutes    Moist Heat Location  Cervical      Manual Therapy   Manual Therapy  Joint mobilization;Soft tissue mobilization    Manual therapy comments  25 minutes    Joint Mobilization  occipital release, Cervical AP mobs C3-5 grade 2-3,     Soft tissue mobilization  cervical paraspinals, Left upper trap, Left Mid trap, L infraspinatus   using biofreeze     Neck Exercises: Stretches   Upper Trapezius Stretch  3 reps;10 seconds    Levator Stretch  3 reps;10 seconds       Trigger Point Dry Needling - 04/19/19 0001    Consent Given?  Yes  Education Handout Provided  Yes    Muscles Treated Head and Neck  Upper trapezius    Muscles Treated Upper Quadrant  Lower trapezius    Dry Needling Comments  lower trap using rib lock; upper trap 2x with a 30x60 needle;     Upper Trapezius Response  Twitch reponse elicited;Palpable increased muscle length    Lower trapezius Response  Twitch response elicited;Palpable increased muscle length                PT Long Term Goals - 04/05/19 1302      PT LONG TERM GOAL #1   Title  Pt will be independent in her HEP and progression.    Time  6    Period  Weeks    Status  New    Target Date  05/17/19      PT LONG TERM GOAL #2   Title  Pt will improve her FOTO to </= 36% limitation.    Baseline  62% Limitation at eval    Time  6    Period  Weeks    Status  New    Target Date  05/17/19      PT LONG TERM GOAL #3   Title  Pt will be able to report improved sleeping by >/= 50 %.    Baseline  pt reporting waking up nightly due to pain and having diffculty sleeping    Time  6    Period  Weeks    Status  New    Target Date  05/17/19      PT LONG TERM GOAL #4   Title  Pt will improve her Left sholder external  rotation to >/= 65 degrees.    Baseline  48 degrees    Time  6    Period  Weeks    Status  New    Target Date  05/17/19            Plan - 04/19/19 1424    Clinical Impression Statement  Pt arriving today reporting 8/10 pain in her L upper trap, cervical spine and down her left UE to her elbow. Pt reporting difficutly with working. Manual STM performed with multiple active trigger points noted. DN handout issued. TPDN performed by Burnis Medin, PT. Stretching performed and HEP updated after DN. Pt reporting relief at end of session. Continue skilled PT.    Personal Factors and Comorbidities  Comorbidity 3+    Comorbidities  HTH, COPD, asthma, arthritis    Examination-Activity Limitations  Reach Overhead;Carry;Lift;Sleep    Examination-Participation Restrictions  Community Activity;Other    Stability/Clinical Decision Making  Stable/Uncomplicated    Rehab Potential  Good    PT Frequency  2x / week    PT Duration  6 weeks    PT Treatment/Interventions  Cryotherapy;Electrical Stimulation;Iontophoresis 4mg /ml Dexamethasone;Moist Heat;Ultrasound;Functional mobility training;Stair training;Gait training;Therapeutic activities;Therapeutic exercise;Neuromuscular re-education;Patient/family education;Manual techniques;Taping;Dry needling;Passive range of motion    PT Next Visit Plan  DN assessment, test grip strength, cervical mobs, cervical retraction, postural exercises, STM and modalities as needed.    PT Home Exercise Plan  Access Code: [email or web address] (cervical retraction in supine, door stretch 3 position, upper trap stretch, ER AAROM with cane, Upper trap stretch, levator stretch, middle trap stretch, scapular retraction)    Consulted and Agree with Plan of Care  Patient       Patient will benefit from skilled therapeutic intervention in order to improve the following deficits and impairments:  Pain, Postural dysfunction, Impaired UE  functional use, Decreased strength, Decreased range of  motion  Visit Diagnosis: Cervicalgia  Pain in thoracic spine  Muscle weakness (generalized)  Chronic left shoulder pain     Problem List Patient Active Problem List   Diagnosis Date Noted  . Shoulder pain, left 01/14/2019  . Pulmonary infiltrate 01/14/2019  . Acute respiratory failure with hypoxia (HCC) 04/14/2018  . Hemoptysis 04/11/2018  . Influenza 04/11/2018  . Cough 04/11/2018  . Acute maxillary sinusitis 02/20/2018  . Rhinitis, chronic 12/04/2017  . Cough variant asthma  vs uacs with vcd 12/26/2016    Dessie Coma, PT 04/19/2019, 4:39 PM  [facility] [address] [place], [place], [address] Phone: [phone]   Fax:  [phone]  Name: Sue Mitchell MRN: [id number] Date of Birth: 06/20/1955

## 2019-04-21 ENCOUNTER — Other Ambulatory Visit: Payer: Self-pay

## 2019-04-21 ENCOUNTER — Ambulatory Visit: Payer: BC Managed Care – PPO | Admitting: Family

## 2019-04-21 ENCOUNTER — Encounter: Payer: Self-pay | Admitting: Family

## 2019-04-21 ENCOUNTER — Ambulatory Visit: Payer: BC Managed Care – PPO | Admitting: Physical Therapy

## 2019-04-21 DIAGNOSIS — M25512 Pain in left shoulder: Secondary | ICD-10-CM | POA: Diagnosis not present

## 2019-04-21 MED ORDER — LIDOCAINE HCL 1 % IJ SOLN
5.0000 mL | INTRAMUSCULAR | Status: AC | PRN
  Administered 2019-04-21: 5 mL

## 2019-04-21 MED ORDER — METHYLPREDNISOLONE ACETATE 40 MG/ML IJ SUSP
40.0000 mg | INTRAMUSCULAR | Status: AC | PRN
  Administered 2019-04-21: 40 mg via INTRA_ARTICULAR

## 2019-04-21 NOTE — Progress Notes (Signed)
Office Visit Note   Patient: Sue Mitchell           Date of Birth: 12/17/1955           MRN: [id number] Visit Date: 04/21/2019              Requested by: Lucianne Lei, MD [address] [place],  [place] [address] PCP: Lucianne Lei, MD  No chief complaint on file.     HPI: The patient is a 64 year old woman who presents today in follow-up for left shoulder and left upper back pain.  She has been to the emergency department as well as physical therapy for this pain in the last month.  She reports she had a trigger point injection which provided interim relief she also has been having dry needling with physical therapy which she feels is helpful for her upper back pain.  Unfortunately the deep shoulder pain that radiates down her forearm that is aching throbbing constant and keeping her from sleeping has been unrelenting.  She has been trying some Aleve with minimal relief.  She is able to reach above her head however she does have some pain with behind her back reaching. No weakness.  She has not had any associated injuries. No relief with heat or robaxin  Assessment & Plan: Visit Diagnoses:  No diagnosis found.  Plan: she will continue with PT. depomedrol injection of shoulder today. She will call Monday if no relief with injection. May consider mri of left shoulder.   Follow-Up Instructions: Return if symptoms worsen or fail to improve.   Back Exam   Tenderness  The patient is experiencing no tenderness.   Range of Motion  The patient has normal back ROM.  Muscle Strength  The patient has normal back strength.  Comments:  Paraspinal tenderness left neck, negative spurlings   Left Shoulder Exam   Tenderness  The patient is experiencing no tenderness.   Range of Motion  The patient has normal left shoulder ROM.  Tests  Impingement: positive Drop arm: negative  Other  Pulse: present       Patient is alert, oriented, no adenopathy, well-dressed, normal  affect, normal respiratory effort.   Imaging: No results found. No images are attached to the encounter.  Labs: Lab Results  Component Value Date   REPTSTATUS 06/11/2013 FINAL 06/10/2013   CULT NO GROWTH Performed at [facility] 06/10/2013     Lab Results  Component Value Date   ALBUMIN 3.5 04/12/2018   ALBUMIN 4.5 12/13/2010    No results found for: MG No results found for: VD25OH  No results found for: PREALBUMIN CBC EXTENDED Latest Ref Rng & Units 04/14/2018 04/14/2018 04/13/2018  WBC 4.0 - 10.5 K/uL 11.5(H) 12.0(H) 11.9(H)  RBC 3.87 - 5.11 MIL/uL 4.12 3.80(L) 3.70(L)  HGB 12.0 - 15.0 g/dL 12.3 11.4(L) 10.8(L)  HCT 36.0 - 46.0 % 39.0 36.3 35.3(L)  PLT 150 - 400 K/uL 242 209 176  NEUTROABS 1.7 - 7.7 K/uL 7.4 - -  LYMPHSABS 0.7 - 4.0 K/uL 2.8 - -     There is no height or weight on file to calculate BMI.  Orders:  No orders of the defined types were placed in this encounter.  No orders of the defined types were placed in this encounter.    Procedures: Large Joint Inj: L subacromial bursa on 04/21/2019 1:54 PM Indications: pain Details: 22 G 1.5 in needle Medications: 5 mL lidocaine 1 %; 40 mg methylPREDNISolone acetate  40 MG/ML Consent was given by the patient.      Clinical Data: No additional findings.  ROS:  All other systems negative, except as noted in the HPI. Review of Systems  Constitutional: Negative for chills and fever.  Musculoskeletal: Positive for arthralgias and myalgias. Negative for joint swelling.  Neurological: Positive for numbness. Negative for weakness.    Objective: Vital Signs: There were no vitals taken for this visit.  Specialty Comments:  No specialty comments available.  PMFS History: Patient Active Problem List   Diagnosis Date Noted  . Shoulder pain, left 01/14/2019  . Pulmonary infiltrate 01/14/2019  . Acute respiratory failure with hypoxia (HCC) 04/14/2018  . Hemoptysis 04/11/2018  . Influenza  04/11/2018  . Cough 04/11/2018  . Acute maxillary sinusitis 02/20/2018  . Rhinitis, chronic 12/04/2017  . Cough variant asthma  vs uacs with vcd 12/26/2016   Past Medical History:  Diagnosis Date  . Abdominal distension   . Abdominal pain   . Arthritis   . Asthma   . Biliary colic   . COPD (chronic obstructive pulmonary disease) (HCC)   . Diarrhea   . GERD (gastroesophageal reflux disease)   . Hypertension   . Nausea     Family History  Problem Relation Age of Onset  . Diabetes Father     Past Surgical History:  Procedure Laterality Date  . cartilage removal  2007   left knee  . CESAREAN SECTION  1976  . LAPAROSCOPY  1992 (approx)    abdominal   Social History   Occupational History  . Not on file  Tobacco Use  . Smoking status: Former Smoker    Packs/day: 0.50    Years: 25.00    Pack years: 12.50    Types: Cigarettes    Quit date: 2011    Years since quitting: 10.1  . Smokeless tobacco: Never Used  Substance and Sexual Activity  . Alcohol use: Yes    Comment: occasional 2 per month  . Drug use: No  . Sexual activity: Yes

## 2019-04-26 ENCOUNTER — Telehealth: Payer: Self-pay | Admitting: Family

## 2019-04-26 DIAGNOSIS — M25512 Pain in left shoulder: Secondary | ICD-10-CM

## 2019-04-26 NOTE — Telephone Encounter (Signed)
Patient called requesting a call back from PA [facility]. Patient states Ps informed her to give her aq call back. Patient phone number is [phone].

## 2019-04-26 NOTE — Telephone Encounter (Signed)
Patient called.   She is requesting she be prescribed a pain medicine.   Call back number: [phone]

## 2019-04-26 NOTE — Telephone Encounter (Signed)
Ok to sch MRI left should pt states that the injection did not help from last week.

## 2019-04-26 NOTE — Telephone Encounter (Signed)
Called pt no answer. Will hold message. Sent message earlier today to NP to advise if MRi is the next step as suggested in last dictation. Will sign off on this message and await instructions.

## 2019-04-26 NOTE — Telephone Encounter (Signed)
Patient called. She would like for Erin to know that she is still in pain. Her call back number is [phone]

## 2019-04-27 ENCOUNTER — Telehealth: Payer: Self-pay | Admitting: Family

## 2019-04-27 ENCOUNTER — Ambulatory Visit: Payer: BC Managed Care – PPO | Admitting: Internal Medicine

## 2019-04-27 MED ORDER — HYDROCODONE-ACETAMINOPHEN 5-325 MG PO TABS: 1.0000 | ORAL_TABLET | Freq: Three times a day (TID) | ORAL | 0 refills | Status: DC | PRN

## 2019-04-27 NOTE — Addendum Note (Signed)
Addended by: Adonis Huguenin on: 04/27/2019 09:27 AM   Modules accepted: Orders

## 2019-04-27 NOTE — Telephone Encounter (Signed)
Patient called asked for a call back concerning what is causing her pain? Patient need to know what is her next plan of care? The number to contact patient is [phone]

## 2019-04-27 NOTE — Telephone Encounter (Signed)
Patient is requesting pain medication. She had rx of hydrocodone 7.5/325 # 40 04/02/19, hydrocodone 5/325 #30 04/09/19, and other for hydrocodone-chlorphen ER #70 on 04/19/19 please advise.

## 2019-04-27 NOTE — Telephone Encounter (Signed)
Sue Mitchell called pt to advise that MRI has been ordered and rx for Hydrocodone has been sent to pharm. Once we have the results can advise of plan.

## 2019-04-27 NOTE — Addendum Note (Signed)
Addended by: Adonis Huguenin on: 04/27/2019 09:25 AM   Modules accepted: Orders

## 2019-05-04 ENCOUNTER — Encounter: Payer: Self-pay | Admitting: Physical Therapy

## 2019-05-04 ENCOUNTER — Other Ambulatory Visit: Payer: Self-pay

## 2019-05-04 ENCOUNTER — Ambulatory Visit: Payer: BC Managed Care – PPO | Attending: Family | Admitting: Physical Therapy

## 2019-05-04 DIAGNOSIS — M542 Cervicalgia: Secondary | ICD-10-CM

## 2019-05-04 DIAGNOSIS — G8929 Other chronic pain: Secondary | ICD-10-CM | POA: Diagnosis not present

## 2019-05-04 DIAGNOSIS — M25512 Pain in left shoulder: Secondary | ICD-10-CM | POA: Insufficient documentation

## 2019-05-04 DIAGNOSIS — M6281 Muscle weakness (generalized): Secondary | ICD-10-CM

## 2019-05-04 DIAGNOSIS — M546 Pain in thoracic spine: Secondary | ICD-10-CM

## 2019-05-04 NOTE — Therapy (Signed)
[facility] [address] [place], [place], [address] Phone: [phone]   Fax:  [phone]  Physical Therapy Treatment  Patient Details  Name: Sue Mitchell MRN: [id number] Date of Birth: 12/22/1955 Referring Provider (PT): Barnie Del, NP   Encounter Date: 05/04/2019  PT End of Session - 05/04/19 1603    Visit Number  3    Number of Visits  13    Date for PT Re-Evaluation  06/03/19    PT Start Time  1145    PT Stop Time  1233    PT Time Calculation (min)  48 min    Activity Tolerance  Patient tolerated treatment well    Behavior During Therapy  [facility] for tasks assessed/performed       Past Medical History:  Diagnosis Date  . Abdominal distension   . Abdominal pain   . Arthritis   . Asthma   . Biliary colic   . COPD (chronic obstructive pulmonary disease) (HCC)   . Diarrhea   . GERD (gastroesophageal reflux disease)   . Hypertension   . Nausea     Past Surgical History:  Procedure Laterality Date  . cartilage removal  2007   left knee  . CESAREAN SECTION  1976  . LAPAROSCOPY  1992 (approx)    abdominal    There were no vitals filed for this visit.  Subjective Assessment - 05/04/19 1145    Subjective  Patient reports the pain s getting worse down her arm. She is having a burning aching pain down into her arm. She reports today it is much worse. She is having difficulty perfroming ADL's and IADL's    Pertinent History  HTN, COPD, biliary colic, asthma, arthritis    Currently in Pain?  Yes    Pain Score  8     Pain Location  Neck    Pain Orientation  Left    Pain Descriptors / Indicators  Aching    Pain Type  Chronic pain    Pain Radiating Towards  into the right hand    Pain Onset  1 to 4 weeks ago    Pain Frequency  Constant    Aggravating Factors   lifting, working bending    Pain Relieving Factors  meds    Effect of Pain on Daily Activities  difficulty putting socks and shoes on                        [facility] Adult PT Treatment/Exercise - 05/04/19 0001      Moist Heat Therapy   Number Minutes Moist Heat  10 Minutes      Manual Therapy   Manual therapy comments  skilled palpation of trigger points     Joint Mobilization  sub-occipital release and gentle manual traction     Soft tissue mobilization  to cervical paraspianls in supine and to posterior shouldr muscles in prone       Neck Exercises: Stretches   Upper Trapezius Stretch  3 reps;10 seconds    Levator Stretch  3 reps;10 seconds    Other Neck Stretches  tennis ball trigger point release to multiple spots        Trigger Point Dry Needling - 05/04/19 0001    Consent Given?  Yes    Education Handout Provided  Previously provided    Muscles Treated Upper Quadrant  Subscapularis    Upper Trapezius Response  Twitch reponse elicited;Palpable increased muscle length  Subscapularis Response  Twitch response elicited    Lower trapezius Response  Twitch response elicited;Palpable increased muscle length           PT Education - 05/04/19 1603    Education Details  reviewed HEP and symptom mangement    Person(s) Educated  Patient    Methods  Explanation;Demonstration;Tactile cues;Verbal cues    Comprehension  Verbalized understanding;Returned demonstration;Verbal cues required;Tactile cues required          PT Long Term Goals - 05/04/19 1605      PT LONG TERM GOAL #1   Title  Pt will be independent in her HEP and progression.    Time  6    Period  Weeks    Status  On-going      PT LONG TERM GOAL #2   Title  Pt will improve her FOTO to </= 36% limitation.    Baseline  62% Limitation at eval    Time  6    Period  Weeks    Status  On-going      PT LONG TERM GOAL #3   Title  Pt will be able to report improved sleeping by >/= 50 %.    Baseline  pt reporting waking up nightly due to pain and having diffculty sleeping    Time  6    Period  Weeks    Status  On-going      PT LONG  TERM GOAL #4   Title  Pt will improve her Left sholder external rotation to >/= 65 degrees.    Baseline  48 degrees    Time  6    Period  Weeks    Status  On-going            Plan - 05/04/19 1458    Clinical Impression Statement  Patient is significantly worse since last bvisit. She is having progressive weakness into her left hand and increased buraning and radicular pain extending inot her had. She has numerous trigger points in her posterior shoulder and upper trap. Therapy focused on manual therapy today. She will be having and MRI.    Comorbidities  HTH, COPD, asthma, arthritis    Examination-Activity Limitations  Hygiene/Grooming    Stability/Clinical Decision Making  Stable/Uncomplicated    Clinical Decision Making  Low    Rehab Potential  Good    PT Frequency  2x / week    PT Treatment/Interventions  Cryotherapy;Electrical Stimulation;Iontophoresis 4mg /ml Dexamethasone;Moist Heat;Ultrasound;Functional mobility training;Stair training;Gait training;Therapeutic activities;Therapeutic exercise;Neuromuscular re-education;Patient/family education;Manual techniques;Taping;Dry needling;Passive range of motion    PT Next Visit Plan  DN assessment, test grip strength, cervical mobs, cervical retraction, postural exercises, STM and modalities as needed.    PT Home Exercise Plan  Access Code: [email or web address] (cervical retraction in supine, door stretch 3 position, upper trap stretch, ER AAROM with cane, Upper trap stretch, levator stretch, middle trap stretch, scapular retraction)    Consulted and Agree with Plan of Care  Patient       Patient will benefit from skilled therapeutic intervention in order to improve the following deficits and impairments:  Pain, Postural dysfunction, Impaired UE functional use, Decreased strength, Decreased range of motion  Visit Diagnosis: Cervicalgia  Pain in thoracic spine  Muscle weakness (generalized)  Chronic left shoulder pain     Problem  List Patient Active Problem List   Diagnosis Date Noted  . Shoulder pain, left 01/14/2019  . Pulmonary infiltrate 01/14/2019  . Acute respiratory failure with hypoxia (HCC) 04/14/2018  .  Hemoptysis 04/11/2018  . Influenza 04/11/2018  . Cough 04/11/2018  . Acute maxillary sinusitis 02/20/2018  . Rhinitis, chronic 12/04/2017  . Cough variant asthma  vs uacs with vcd 12/26/2016    Carney Living PT DPT  05/04/2019, 4:08 PM  [facility] [address] [place], [place], [address] Phone: [phone]   Fax:  [phone]  Name: Sue Mitchell MRN: [id number] Date of Birth: Feb 10, 1956

## 2019-05-06 ENCOUNTER — Telehealth: Payer: Self-pay | Admitting: Family

## 2019-05-06 NOTE — Telephone Encounter (Signed)
I called pt and lm on vm  to advise that she can use whatever OTC pain medication that she normally uses following the package instructions and she can discuss with Denny Peon at her appt tomorrow with Denny Peon.  To call with any questions.

## 2019-05-06 NOTE — Telephone Encounter (Signed)
Patient called. Says she is in a lot of pain. Would like to know what she should take.

## 2019-05-07 ENCOUNTER — Ambulatory Visit: Payer: BC Managed Care – PPO | Admitting: Family

## 2019-05-11 ENCOUNTER — Other Ambulatory Visit: Payer: Self-pay

## 2019-05-11 ENCOUNTER — Ambulatory Visit: Payer: BC Managed Care – PPO | Admitting: Physical Therapy

## 2019-05-11 DIAGNOSIS — M542 Cervicalgia: Secondary | ICD-10-CM

## 2019-05-11 DIAGNOSIS — G8929 Other chronic pain: Secondary | ICD-10-CM | POA: Diagnosis not present

## 2019-05-11 DIAGNOSIS — M25512 Pain in left shoulder: Secondary | ICD-10-CM | POA: Diagnosis not present

## 2019-05-11 DIAGNOSIS — M546 Pain in thoracic spine: Secondary | ICD-10-CM | POA: Diagnosis not present

## 2019-05-11 DIAGNOSIS — M6281 Muscle weakness (generalized): Secondary | ICD-10-CM | POA: Diagnosis not present

## 2019-05-12 ENCOUNTER — Encounter: Payer: Self-pay | Admitting: Physical Therapy

## 2019-05-12 NOTE — Therapy (Addendum)
[place], [place], [address] Phone: [phone]   Fax:  [phone]  Physical Therapy Treatment Discharge Summary  Patient Details  Name: Sue Mitchell MRN: [id number] Date of Birth: 02/23/1956 Referring Provider (PT): Dondra Prader, NP   Encounter Date: 05/11/2019  PT End of Session - 05/11/19 1708    Visit Number  4    Number of Visits  13    Date for PT Re-Evaluation  06/03/19    PT Start Time  1500    PT Stop Time  1544    PT Time Calculation (min)  44 min    Activity Tolerance  Patient tolerated treatment well    Behavior During Therapy  [facility] for tasks assessed/performed       Past Medical History:  Diagnosis Date  . Abdominal distension   . Abdominal pain   . Arthritis   . Asthma   . Biliary colic   . COPD (chronic obstructive pulmonary disease) ([place])   . Diarrhea   . GERD (gastroesophageal reflux disease)   . Hypertension   . Nausea     Past Surgical History:  Procedure Laterality Date  . cartilage removal  2007   left knee  . CESAREAN SECTION  1976  . LAPAROSCOPY  1992 (approx)    abdominal    There were no vitals filed for this visit.  Subjective Assessment - 05/12/19 1424    Subjective  The pain is better then last visit but she continues to hvae pain shooting down her arm. Her pain is in her scapula and lower cervical area.    Pertinent History  HTN, COPD, biliary colic, asthma, arthritis    Currently in Pain?  Yes    Pain Score  6     Pain Location  Neck    Pain Orientation  Right    Pain Descriptors / Indicators  Aching    Pain Type  Chronic pain    Pain Onset  1 to 4 weeks ago    Pain Frequency  Constant    Aggravating Factors   lifting bending forward    Pain Relieving Factors  medication    Effect of Pain on Daily Activities  difficulty utting socs and shoes on                       [facility] Adult PT Treatment/Exercise - 05/12/19 0001      Manual  Therapy   Manual therapy comments  skilled palpation of trigger points     Joint Mobilization  sub-occipital release and gentle manual traction; IASTYM peri-scpaualr muscles     Soft tissue mobilization  to cervical paraspianls in supine and to posterior shouldr muscles in prone       Neck Exercises: Stretches   Upper Trapezius Stretch  3 reps;10 seconds    Levator Stretch  3 reps;10 seconds       Trigger Point Dry Needling - 05/12/19 0001    Consent Given?  Yes    Education Handout Provided  Previously provided    Muscles Treated Back/Hip  Thoracic multifidi    Subscapularis Response  Twitch response elicited    Lower trapezius Response  Twitch response elicited;Palpable increased muscle length    Thoracic multifidi response  Twitch response elicited           PT Education - 05/12/19 1427    Education Details  benefits and risk of TPDN    Person(s) Educated  Patient    Methods  Explanation;Demonstration;Tactile cues;Verbal cues    Comprehension  Verbalized understanding;Returned demonstration;Verbal cues required;Tactile cues required          PT Long Term Goals - 05/12/19 1428      PT LONG TERM GOAL #1   Title  Pt will be independent in her HEP and progression.    Time  6    Period  Weeks    Status  On-going      PT LONG TERM GOAL #2   Title  Pt will improve her FOTO to </= 36% limitation.    Baseline  62% Limitation at eval    Time  6    Period  Weeks    Status  On-going      PT LONG TERM GOAL #3   Title  Pt will be able to report improved sleeping by >/= 50 %.    Baseline  pt reporting waking up nightly due to pain and having diffculty sleeping    Time  6    Period  Weeks    Status  On-going      PT LONG TERM GOAL #4   Title  Pt will improve her Left sholder external rotation to >/= 65 degrees.    Baseline  48 degrees    Time  6    Period  Weeks    Status  On-going            Plan - 05/11/19 1709    Clinical Impression Statement  Patient  continues to have significant spasming of the peri-scpualr area. He has had reproduction of symptoms with trigger point releease to sub-scap area and to the lower trap area. She had improved symptoms with manual therapy. Therapy focused on manual therapy again this visit. We will add scap strengthening back in next visit.    Personal Factors and Comorbidities  Comorbidity 3+    Comorbidities  HTH, COPD, asthma, arthritis    Examination-Activity Limitations  Hygiene/Grooming    Examination-Participation Restrictions  Community Activity;Other    Stability/Clinical Decision Making  Stable/Uncomplicated    Rehab Potential  Good    PT Frequency  2x / week    PT Duration  6 weeks    PT Treatment/Interventions  Cryotherapy;Electrical Stimulation;Iontophoresis 51m/ml Dexamethasone;Moist Heat;Ultrasound;Functional mobility training;Stair training;Gait training;Therapeutic activities;Therapeutic exercise;Neuromuscular re-education;Patient/family education;Manual techniques;Taping;Dry needling;Passive range of motion    PT Next Visit Plan  DN assessment, test grip strength, cervical mobs, cervical retraction, postural exercises, STM and modalities as needed.    PT Home Exercise Plan  Access Code: [email or web address](cervical retraction in supine, door stretch 3 position, upper trap stretch, ER AAROM with cane, Upper trap stretch, levator stretch, middle trap stretch, scapular retraction)    Consulted and Agree with Plan of Care  Patient       Patient will benefit from skilled therapeutic intervention in order to improve the following deficits and impairments:  Pain, Postural dysfunction, Impaired UE functional use, Decreased strength, Decreased range of motion  Visit Diagnosis: Cervicalgia  Pain in thoracic spine  Muscle weakness (generalized)  Chronic left shoulder pain     Problem List Patient Active Problem List   Diagnosis Date Noted  . Shoulder pain, left 01/14/2019  . Pulmonary infiltrate  01/14/2019  . [place] 04/14/2018  . Hemoptysis 04/11/2018  . Influenza 04/11/2018  . Cough 04/11/2018  . Acute maxillary sinusitis 02/20/2018  . Rhinitis, chronic 12/04/2017  . Cough variant asthma  vs uacs with  vcd 12/26/2016    Carney Living PT DPT  05/12/2019, 2:33 PM  [facility] [address] [place], [place], [address] Phone: [phone]   Fax:  [phone]  Name: Sue Mitchell MRN: [id number] Date of Birth: September 23, 1955  PHYSICAL THERAPY DISCHARGE SUMMARY  Visits from Start of Care: 4  Current functional level related to goals / functional outcomes: See above    Remaining deficits: See above   Education / Equipment: HEP Plan: Patient agrees to discharge.  Patient goals were not met. Patient is being discharged due to not returning since the last visit.  ?????         Kearney Hard, PT, MPT 07/01/19 3:04 PM

## 2019-05-22 ENCOUNTER — Ambulatory Visit (HOSPITAL_BASED_OUTPATIENT_CLINIC_OR_DEPARTMENT_OTHER)
Admission: RE | Admit: 2019-05-22 | Discharge: 2019-05-22 | Disposition: A | Discharge status: Home / Self Care | Payer: BC Managed Care – PPO | Source: Ambulatory Visit | Attending: Family | Admitting: Family

## 2019-05-22 ENCOUNTER — Other Ambulatory Visit: Payer: Self-pay

## 2019-05-22 DIAGNOSIS — S43432A Superior glenoid labrum lesion of left shoulder, initial encounter: Secondary | ICD-10-CM | POA: Diagnosis not present

## 2019-05-22 DIAGNOSIS — M25512 Pain in left shoulder: Secondary | ICD-10-CM | POA: Diagnosis not present

## 2019-05-27 ENCOUNTER — Ambulatory Visit (INDEPENDENT_AMBULATORY_CARE_PROVIDER_SITE_OTHER): Payer: BC Managed Care – PPO | Admitting: Orthopedic Surgery

## 2019-05-27 ENCOUNTER — Encounter: Payer: Self-pay | Admitting: Orthopedic Surgery

## 2019-05-27 ENCOUNTER — Ambulatory Visit: Payer: Self-pay

## 2019-05-27 ENCOUNTER — Other Ambulatory Visit: Payer: Self-pay

## 2019-05-27 VITALS — Ht 59.0 in | Wt 171.0 lb

## 2019-05-27 DIAGNOSIS — M542 Cervicalgia: Secondary | ICD-10-CM | POA: Diagnosis not present

## 2019-05-27 DIAGNOSIS — M5030 Other cervical disc degeneration, unspecified cervical region: Secondary | ICD-10-CM

## 2019-05-27 MED ORDER — PREDNISONE 10 MG PO TABS: 10.0000 mg | ORAL_TABLET | Freq: Every day | ORAL | 1 refills | Status: DC

## 2019-05-31 ENCOUNTER — Encounter: Payer: Self-pay | Admitting: Orthopedic Surgery

## 2019-05-31 NOTE — Progress Notes (Signed)
Office Visit Note   Patient: Sue Mitchell           Date of Birth: 1955/10/21           MRN: [id number] Visit Date: 05/27/2019              Requested by: Lucianne Lei, [address] [place],  [place] [address] PCP: Lucianne Lei, MD  Chief Complaint  Patient presents with  . Left Shoulder - Follow-up    MRI Review      HPI: Patient is a 64 year old woman who was seen in follow-up for the left shoulder.  Patient states that her shoulder pain seems to be more radicular in nature at this time with numbness extending down to her elbow and hand.  Patient states she is on Neurontin.  Patient states she recently went to the emergency room due to increasing left upper extremity radicular symptoms.  Assessment & Plan: Visit Diagnoses:  1. Neck pain   2. Degenerative disc disease, cervical     Plan: Patient symptoms seem to be more related to her radicular degenerative disc disease of her cervical spine more so than the labral tear of her shoulder.  We will start her on prednisone 10 mg with breakfast and obtain an MRI scan of her cervical spine.  Follow-Up Instructions: Return in about 2 weeks (around 06/10/2019).   Ortho Exam  Patient is alert, oriented, no adenopathy, well-dressed, normal affect, normal respiratory effort. Examination patient does have a pain with Neer and Hawkins impingement test of the left shoulder her MRI scan does show a labral tear however her symptoms at this time seem to be more radicular in nature.  She has tenderness to palpation over the thoracic outlet this reproduces her pain down her arm she has decreased range of motion of the cervical spine has no focal motor weakness in either upper extremity.  Subjective numbness down into the hand globally.  Imaging: No results found. No images are attached to the encounter.  Labs: Lab Results  Component Value Date   REPTSTATUS 06/11/2013 FINAL 06/10/2013   CULT NO GROWTH Performed at [facility] 06/10/2013     Lab Results  Component Value Date   ALBUMIN 3.5 04/12/2018   ALBUMIN 4.5 12/13/2010    No results found for: MG No results found for: VD25OH  No results found for: PREALBUMIN CBC EXTENDED Latest Ref Rng & Units 04/14/2018 04/14/2018 04/13/2018  WBC 4.0 - 10.5 K/uL 11.5(H) 12.0(H) 11.9(H)  RBC 3.87 - 5.11 MIL/uL 4.12 3.80(L) 3.70(L)  HGB 12.0 - 15.0 g/dL 12.3 11.4(L) 10.8(L)  HCT 36.0 - 46.0 % 39.0 36.3 35.3(L)  PLT 150 - 400 K/uL 242 209 176  NEUTROABS 1.7 - 7.7 K/uL 7.4 - -  LYMPHSABS 0.7 - 4.0 K/uL 2.8 - -     Body mass index is 34.54 kg/m.  Orders:  Orders Placed This Encounter  Procedures  . XR Cervical Spine 2 or 3 views  . MR Cervical Spine w/o contrast   Meds ordered this encounter  Medications  . predniSONE (DELTASONE) 10 MG tablet    Sig: Take 1 tablet (10 mg total) by mouth daily with breakfast.    Dispense:  30 tablet    Refill:  1     Procedures: No procedures performed  Clinical Data: No additional findings.  ROS:  All other systems negative, except as noted in the HPI. Review of Systems  Objective: Vital Signs: Ht 4\' 11"  (  1.499 m)   Wt 171 lb (77.6 kg)   BMI 34.54 kg/m   Specialty Comments:  No specialty comments available.  PMFS History: Patient Active Problem List   Diagnosis Date Noted  . Shoulder pain, left 01/14/2019  . Pulmonary infiltrate 01/14/2019  . Acute respiratory failure with hypoxia ([facility]) 04/14/2018  . Hemoptysis 04/11/2018  . Influenza 04/11/2018  . Cough 04/11/2018  . Acute maxillary sinusitis 02/20/2018  . Rhinitis, chronic 12/04/2017  . Cough variant asthma  vs uacs with vcd 12/26/2016   Past Medical History:  Diagnosis Date  . Abdominal distension   . Abdominal pain   . Arthritis   . Asthma   . Biliary colic   . COPD (chronic obstructive pulmonary disease) ([facility])   . Diarrhea   . GERD (gastroesophageal reflux disease)   . Hypertension   . Nausea     Family History  Problem  Relation Age of Onset  . Diabetes Father     Past Surgical History:  Procedure Laterality Date  . cartilage removal  2007   left knee  . CESAREAN SECTION  1976  . LAPAROSCOPY  1992 (approx)    abdominal   Social History   Occupational History  . Not on file  Tobacco Use  . Smoking status: Former Smoker    Packs/day: 0.50    Years: 25.00    Pack years: 12.50    Types: Cigarettes    Quit date: 2011    Years since quitting: 10.2  . Smokeless tobacco: Never Used  Substance and Sexual Activity  . Alcohol use: Yes    Comment: occasional 2 per month  . Drug use: No  . Sexual activity: Yes

## 2019-06-01 ENCOUNTER — Telehealth: Payer: Self-pay | Admitting: Internal Medicine

## 2019-06-01 MED ORDER — GABAPENTIN 100 MG PO CAPS: 100.0000 mg | ORAL_CAPSULE | Freq: Four times a day (QID) | ORAL | 5 refills | Status: DC

## 2019-06-01 NOTE — Telephone Encounter (Signed)
Rx for gabapentin was refilled  Pt aware  Nothing further needed

## 2019-06-03 ENCOUNTER — Other Ambulatory Visit: Payer: BC Managed Care – PPO

## 2019-07-02 ENCOUNTER — Ambulatory Visit
Admission: RE | Admit: 2019-07-02 | Discharge: 2019-07-02 | Disposition: A | Discharge status: Home / Self Care | Payer: BC Managed Care – PPO | Source: Ambulatory Visit | Attending: Orthopedic Surgery | Admitting: Orthopedic Surgery

## 2019-07-02 ENCOUNTER — Other Ambulatory Visit: Payer: Self-pay

## 2019-07-02 ENCOUNTER — Telehealth: Payer: Self-pay | Admitting: Orthopedic Surgery

## 2019-07-02 DIAGNOSIS — M5030 Other cervical disc degeneration, unspecified cervical region: Secondary | ICD-10-CM

## 2019-07-02 DIAGNOSIS — M542 Cervicalgia: Secondary | ICD-10-CM

## 2019-07-02 DIAGNOSIS — M4802 Spinal stenosis, cervical region: Secondary | ICD-10-CM | POA: Diagnosis not present

## 2019-07-02 NOTE — Telephone Encounter (Signed)
Pt called wanting to know if there was anything Dr.Duda could prescribe to help with her pain; pt states she was using over the counter but it wasn't doing anything to help.   [phone]

## 2019-07-02 NOTE — Telephone Encounter (Signed)
Pt had MRI C spine today( results not yet available) , was given prednisone rx at visit. Calling asking if there is something else that she can do or have for pain.

## 2019-07-05 ENCOUNTER — Other Ambulatory Visit: Payer: Self-pay

## 2019-07-05 DIAGNOSIS — M542 Cervicalgia: Secondary | ICD-10-CM

## 2019-07-05 DIAGNOSIS — M5030 Other cervical disc degeneration, unspecified cervical region: Secondary | ICD-10-CM

## 2019-07-05 MED ORDER — PREDNISONE 10 MG PO TABS: ORAL_TABLET | ORAL | 0 refills | Status: DC

## 2019-07-05 NOTE — Telephone Encounter (Signed)
20 mg of prednisone with breakfast is the best medicine to treat the radicular neck pain.  Recommend patient follow-up with Dr. Alvester Morin for evaluation for epidural steroid injection.

## 2019-07-05 NOTE — Telephone Encounter (Signed)
Called pt to advise of message below. She voiced understanding. Referral made for FN and rx faxed to pharm.

## 2019-07-06 ENCOUNTER — Telehealth: Payer: Self-pay | Admitting: *Deleted

## 2019-07-09 ENCOUNTER — Telehealth: Payer: Self-pay | Admitting: Physical Medicine and Rehabilitation

## 2019-07-09 ENCOUNTER — Other Ambulatory Visit: Payer: Self-pay | Admitting: Physical Medicine and Rehabilitation

## 2019-07-09 DIAGNOSIS — F411 Generalized anxiety disorder: Secondary | ICD-10-CM

## 2019-07-09 MED ORDER — DIAZEPAM 5 MG PO TABS: ORAL_TABLET | ORAL | 0 refills | Status: DC

## 2019-07-09 NOTE — Telephone Encounter (Signed)
Called pt and advised per Dr. Alvester Morin that he will go over her MRI results 08/03/19.

## 2019-07-09 NOTE — Progress Notes (Signed)
Pre-procedure diazepam ordered for pre-operative anxiety.  

## 2019-07-09 NOTE — Telephone Encounter (Signed)
Called pt and advised.  

## 2019-07-09 NOTE — Telephone Encounter (Signed)
Done

## 2019-07-09 NOTE — Telephone Encounter (Signed)
Patient called wanting to know if she should keep the appointment with Dr. Lajoyce Corners or could Dr. Alvester Morin go over her MRI on June 8th.  KD#[phone].  Thank you.

## 2019-07-12 ENCOUNTER — Ambulatory Visit: Payer: BC Managed Care – PPO | Admitting: Orthopedic Surgery

## 2019-07-13 ENCOUNTER — Ambulatory Visit: Payer: BC Managed Care – PPO | Admitting: Internal Medicine

## 2019-07-19 ENCOUNTER — Other Ambulatory Visit: Payer: Self-pay

## 2019-07-19 ENCOUNTER — Ambulatory Visit (INDEPENDENT_AMBULATORY_CARE_PROVIDER_SITE_OTHER): Payer: BC Managed Care – PPO | Admitting: Internal Medicine

## 2019-07-19 ENCOUNTER — Encounter: Payer: Self-pay | Admitting: Internal Medicine

## 2019-07-19 DIAGNOSIS — J45991 Cough variant asthma: Secondary | ICD-10-CM

## 2019-07-19 DIAGNOSIS — J310 Chronic rhinitis: Secondary | ICD-10-CM

## 2019-07-19 NOTE — Progress Notes (Signed)
Subjective:    Patient ID: Sue Mitchell, female   DOB: 13-Jun-1955     MRN: [id number]   Brief patient profile:  64 yobf  RT quit smoking 2011 @ wt 140  with h/o sinus symptoms assoc with cough and need for saba prn as teenager while living in [place] but after arrival here at age late 4's it changed to point where bothered her mostly with onset of cold weather typically req ov rx  abx and prednisone help a lot and this pattern continued even after quit smoking to where the episodes last longer and onset of this not directed linked to cold weather in fact  had one April 2018 never  Completely cleared  then flared again in Oct 01 2016 severe dry cough / eval by allergist /ent since onset neg w/u so referred by Dr Blenda Nicely to pulmonary clinic 12/26/16     History of Present Illness  12/26/2016 1st [facility] office visit/ [name]   Chief Complaint  Patient presents with  . Pulm Consult    Pt referred by Dr. Lind Guest ENT. Pt has productive cough-clear mucus sometimes yellow. Pt had horseness in voice for 6-8 weeks, no chills or fever, have some chest pain and tightness with cough.  recurrent cough since April 2018 never resolved p rx as uri then flaired early August 2018 rx zpak/pred/saba> some better  Cough was worse at hs now   Am feels wheezy not really coughing much up but what she does is white and < 1 tbsp Cough seems worse with exp to cold air at work  Assoc overt hb better on zantac  Some gen ant chest discomfort with severe coughing fits  rec Plan A = Automatic =  symbicort 80 Take 2 puffs first thing in am and then another 2 puffs about 12 hours later.  Plan B = Backup Only use your albuterol as a rescue medication  Start zantac 150- 300 mg after bfast and after supper until cough better  GERD diet    02/06/2017  f/u ov/[name] re:  GOLD I copd/ cough on cold air exp  Chief Complaint  Patient presents with  . Follow-up    Cough had improved but then worsened again  with colder weather. She is using her albuterol inhaler 2 x per wk on average.   not limited by doe / steps ok except for knees  Some gerd at hs taking h2 am only  rec Pantoprazole (protonix) 40 mg  Take  30-60 min before first meal of the day and Zantac  300 mg bedtime until return to office - this is the best way to tell whether stomach acid is contributing to your problem.   GERD diet  Please schedule a follow up office visit in 6 weeks, call sooner if needed     10/21/2017  f/u ov/Tayten Heber re: acute refractory cough / saw allergist cannot name "all neg"  ? [name]?  Chief Complaint  Patient presents with  . Follow-up    Last seen by [facility] 02/06/17. Patient states she has had a productive cough, post nasal drip, and constantly using her voice. At night, she becomes so congested that she has to breathe through her mouth instead of nose.    from last ov until 09/25/17 fine on on ranitidine one bid/symb 80 2bid Never took protonix  Was still needing saba twice a week at baseline and a lot more while working at [employer]  Then abruptly  worse Aug 1  lots sinus drainage clear > zpak early in Aug by [name] some better for a week or two then worse says ran out of symbicort one day prior to ov / using lots of saba now and severe nasal congestion as well as hoarseness and can't sleep due to cough rec Plan A = Automatic = symbiocort 80 Take 2 puffs first thing in am and then another 2 puffs about 12 hours later.  Plan B = Backup Only use your albuterol as a rescue medication  Prednisone 10 mg take  4 each am x 2 days,   2 each am x 2 days,  1 each am x 2 days and stop  Pantoprazole (protonix) 40 mg   Take  30-60 min before first meal of the day and ranitidine 300 mg each evening GERD diet      11/04/2017  f/u ov/[name] re: atypical asthma in RT  Chief Complaint  Patient presents with  . Follow-up    Pt c/o sneezing, wheezing and cough with clear sputum- started after she mowed her lawn 1 day ago. She  has had to use her albuterol inhaler.   wakes up feeling good most am's s cough/ wheeze about 30 min later takes first 2 pffs of symb 80 around 5 15 am  Then next 2 pffs after 9 pm  Had been doing great prior mowing grass one day prior to OV Rarely need saba since last flare    Not limited by breathing from desired activities   rec Continue symbicort 80 Take 2 puffs first thing in am and then another 2 puffs about 12 hours later thru spacer Add singulair 10 mg each pm    12/04/2017  f/u ov/Aricka Goldberger re:   symb 80 2bid/ singualir sometimes  Chief Complaint  Patient presents with  . Follow-up    PFT today, continues to have cough with hoarseness. She uses her albuterol inhaler 1 x per wk on average.   Dyspnea:  fine Cough: p grass exp / chemicals/ cologne dry Sleeping: flat/ one pillow no noct symptms SABA use: rare 02: none   Flare of cough / drainage 12/01/17 > has not tried otc's Plan A = Automatic = symbicort 80 Take 2 puffs first thing in am and then another 2 puffs about 12 hours later and remember to breath it out through your nose  singulair  10 mg every evening  Plan B = Backup Only use your albuterol as a rescue medication  For nasal symptoms > zyrtec 10 mg on at bedtime as needed > too sleepy    02/20/18 NP ov rec Augmentin/ pred/ antihistamine/singulair > no better and could not take singulair / did not try zyrtec in am "makes me sleepy"     03/11/2018 extended  f/u ov/[name] re: refractory  Cough since aug 2018    Chief Complaint  Patient presents with  . Follow-up    Cough not improving. She is coughing up clear to white sputum. She states her cough is worse when she goes to work and when she first lies down at night. She is using her albuterol inhaler once daily on average.   Dyspnea:  Not limited by breathing from desired activities   Cough: worse around 5pm  Then immediately when supine x then some during the night, never took zyrtec at hs as rec with sense of globus and  throat / chest "congestion" but no excess mucus  Sleeping: on flat bed on side  SABA use: ?  if neb helps more than hfa - not really  Sure but "it sure makes me shake" Still using mint products  rec Symbicort 80  Take 2 puffs first thing in am and then another 2 puffs about 12 hours later.  Pantoprazole (protonix) 40 mg   Take  30-60 min before first meal of the day and Pepcid (famotidine)  20 mg one @  bedtime until return to office - this is the best way to tell whether stomach acid is contributing to your problem.   GERD diet  Try zyrtec 10 mg after supper  Take delsym two tsp every 12 hours and supplement if needed with  vicodin up to 1every 4 hours to suppress the urge to cough. Swallowing water and/or using ice chips/non mint and menthol containing candies (such as lifesavers or sugarless jolly ranchers) are also effective.  You should rest your voice and avoid activities that you know make you cough. Once you have eliminated the cough for 3 straight days try reducing the vicodin first,  then the delsym as tolerated.   On 03/14/18 Prednisone 10 mg take  4 each am x 2 days,   2 each am x 2 days,  1 each am x 2 days and stop  Please schedule a follow up office visit in 2 weeks, sooner if needed  with all medications /inhalers/ solutions in hand so we can verify exactly what you are taking. This includes all medications from all doctors and over the counters    03/26/2018  f/u ov/Kamden Stanislaw re: refractory cough/ wheeze on symb 80 2bid  Chief Complaint  Patient presents with  . Follow-up    Pt c/o sinus congestion, cough with yellow sputum and wheezing. She is using her albuterol inhaler once per wk on average.   Dyspnea:  Ok unless cough Cough:  at work and at home same severity/ frequency  Sleeping: not coughing while sleeping  SABA use:  Not really helping 02: none  gen ant chest soreness from coughing/ not able to use vicodin daytime to suppress cough rec  Only use your albuterol as a rescue  medication  Please see patient coordinator before you leave today  to schedule sinus CT Please remember to go to the  x-ray department  for your tests - we will call you with the results when they are available  Please schedule a follow up office visit in 2 weeks, sooner if needed  with all medications /inhalers/ solutions in hand so we can verify exactly what you are taking. This includes all medications from all doctors and over the counters       04/08/2018  f/u ov/Aneri Slagel re: refractory cough / brought just her inhalers to [facility]   Chief Complaint  Patient presents with  . Acute Visit    Pt has had complaints of sinius drainage and a cough x4 days and also states she has had problems sleeping. Pt also has been hurting everywhere, feeling weak, and has had sweats.  Dyspnea: usually only sob when coughing Cough: white mucus / min production "but feels like it's choking her"  Sleeping: on side bed pillows too heavy for bed blocks/ worse cough now at hs / not using cough suppression as advised  SABA use: rarely / symbicort count on 50 so missed about 14 doses or continued to use beyond the red line on the prior/ warned about both 02: none Reported after much back and forth "felt good only while on prednisone" and gradually worse  off it  - turns out "felt good" does not mean the cough was gone but breathing was better. rec Prednisone 10mg   Take 4 for three days 3 for three days 2 for three days 1 for three days and stop Plan A = Automatic = Symbicort 80 Take 2 puffs first thing in am and then another 2 puffs about 12 hours later and spiriva 2 puff each am  Plan B = Backup Only use your albuterol inhaler as a rescue medication   Try gabapentin 100 mg twice daily bfast and supper and if can't take it twice daily just take at bedtime  For drainage / throat tickle try take CHLORPHENIRAMINE  4 mg (chlortabs  [facility])   Stay on protonix Take 30- 60 min before your first and last meals of the day   Take delsym two tsp every 12 hours and supplement if needed with  vicodine  up to 2 every 4 hours to suppress the urge to cough. .  Please schedule a follow up office visit in 2 weeks, sooner if needed  with all medications /inhalers/ solutions in hand so we can verify exactly what you are taking. This includes all medications from all doctors and over the counters - needs spirometry on return     Admit date: 04/14/2018 Discharge date: 04/18/2018   Equipment/Devices: Nebulizer with albuterol sol'n, 2L oxygen ordered several days prior to discharge. Apparently patient left stating she would get the nebulizer and oxygen from [facility] and did not want to wait for it to be delivered to the room per care management. Per CM note, this will be delivered to the home.   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 At the time of discharge she reportedly maintained sufficient oxygenation during ambulation, but in the ED she was found to be hypoxic, wheezing, coughing, and weak, and was subsequently readmitted. With continued treatment including IV steroids she's subjectively significantly improved. She was weaned from oxygen while at rest and still desaturates on ambulation. She is discharged with DME in stable condition with follow up in 48 hours.  Discharge Diagnoses:  Principal Problem:   Acute respiratory failure with hypoxia (HCC)   Cough variant asthma  vs uacs wit vcd   Influenza   Cough  Acute hypoxic respiratory failure due to influenza A and asthma exacerbation:  - Completed5 days of tamiflu - Continue symbicort, spiriva respimat - Scheduled and prnalbuterol nebulizer (provided at DC) -Wheezing has improved so converted to po steroid with improvement, has prednisone at home, ordered to  take 40mg  po daily until follow up.  - Continue supplemental oxygen to maintain SpO2 >90%. Despite significant improvement, does still need O2 at discharge. Will anticipate continued improvement and liberation from oxygen, will need recheck at follow up office visit with pulmonology the coming week. - Continue antitussive. PMPAware queried, has gotten hydrocodone-APAP from Dr. 3/18 2/12 and cough syrup intermittently from PCP.   Hemoptysis: CTA chest motion-degraded at last admission, no PE.  - Resolved.   Hypertension:  - Continue norvasc and HCTZ since recently stopping ARB for concern of exacerbating cough.   GERD:  - Continue PPI    04/20/2018  Extended f/u ov/Yeva Bissette re: post hosp f/u  Chief Complaint  Patient presents with  . Follow-up    Breathing has improved. She is not using her  albuterol inhaler but is using albuterol neb about 3 x per day.   Dyspnea:  MMRC2 = can't walk a nl pace on a flat grade s sob but does fine slow and flat  Cough: gone/ min urge to clear throat but now on tussionex Sleeping: side / pillows to prop up  SABA use: way over using as above  02: none at all  rec Continue pantoprazole 40 mg Take 30- 60 min before your first and last meals of the day  Gabapentin  Restart 100 mg twice daily bfast and bedtime Plan A = Automatic = Symbicort 80 Take 2 puffs first thing in am and then another 2 puffs about 12 hours later.  Work on inhaler technique:   Plan B = Backup Only use your albuterol inhaler as a rescue medication  Plan C = Crisis - only use your albuterol nebulizer if you first try Plan B and it fails to help > ok to use the nebulizer up to every 4 hours but if start needing it regularly call for immediate appointment Finish the prednisone and stop spiriva F/u in 2 weeks with meds in hand (#35 on present symb 80 and one sample given) Add:  Consider performist/bud neb if can't wean off pred/saba neb and hfa makes her cough worse due to effects on upper  airway    05/04/2018  f/u ov/Tyniah Kastens re:   symbicort 80 one full at home and total of 45 puffs samples/ 32 gabapenitn  Chief Complaint  Patient presents with  . Acute Visit    Cough has improved but has not resolved. She is coughing up min, clear sputum. She has not had to use her albuterol inhaler.   Dyspnea:  MMRC1 = can walk nl pace, flat grade, can't hurry or go uphills or steps s sob   Cough: p exertion Sleeping: fine 30 degrees on pillows  SABA use: no saba  02: no rec Increase gabapentin to 100 mg three times a day  ok to return to work 05/16/2018  Please schedule a follow up office visit in 3 weeks, sooner if needed  with all medications /inhalers/ solutions in hand so we can verify exactly what you are taking. This includes all medications from all doctors and over the counters        History of Present Illness: 05/25/2018   televist/Deysi Soldo re: cough/ sob symbicort 80 2bid /still on samples "half full"  Dyspnea:  Walked for 5 minutes some inclines  Cough: some daytime / dry assoc with hoarseness  Sleeping: flat bed/ 3 pillows  SABA use: none   rec For drainage / throat tickle try take CHLORPHENIRAMINE  4 mg (chlortab 4 mg) - take one every 4 hours as needed - available over the counter- may cause drowsiness so start with just a bedtime dose or two and see how you tolerate it before trying in daytime   Increase gabapentin to 100 mg four times daily  Continue Symbicort 80 Take 2 puffs first thing in am and then another 2 puffs about 12 hours later (use coupon and let me know if they won't honor it  I will take you out of work until end of April 2020      06/19/18 Defer work note to Dr Parke Simmers re back For drainage / throat tickle try take CHLORPHENIRAMINE  4 mg  (Chlortab 4mg   at should be easiest to find in the green box)  take one every 4 hours as needed -  available over the counter- may cause drowsiness so start with just a bedtime dose or two and see how you  tolerate it before trying in daytime   Elevate head of bed on 6-8 inch bed block   07/23/2018  f/u ov/Florrie Ramires re: asthma with uacs  On symb 80 2bid , gapentin 100 qid /ppi bid Chief Complaint  Patient presents with  . Follow-up    Breathing is much improved. She is coughing minimally- mainly in the am's. She has not had to use her rescue inhaler in over a month.  Dyspnea:  Walks x 20 mins s stopping, some hills Cough: minimal/ non productive, day >noct  Sleeping: bed is flat  3pillows for back SABA use: none   rec No change in medications Ok to leave off pm dose of pantaprozole if not coughing Ok to return to work without restrictions on August 10 2018    09/01/2018  f/u ov/Damaso Laday re: asthma  Vs vcd back on ppi bid / not keeping up with symbicort rx due to cost but did not contact us re this issue and just stopped taking  Chief Complaint  Patient presents with  . Follow-up    increased cough for a wk- prod with white sputum. She is also wheezing and has had to use her albuterol 4 x since her last visit.   Dyspnea:  Not limited by breathing from desired activities  / up and down steps  Cough: gradually worse x one week / min sputum mostly in ams Sleeping: at least once per night wakes up needing saba  SABA use: 3-4 x since 02: none  rec Plan A = Automatic = dulera 100 Take 2 puffs first thing in am and then another 2 puffs about 12 hours later.  Plan B = Backup Only use your albuterol (proair)  inhaler as a rescue medication to be used if you can't catch your breath by resting or doing a relaxed purse lip breathing pattern.  - The less you use it, the better it will work when you need it. - Ok to use the inhaler up to 2 puffs  every 4 hours if you must but call for appointment if use goes up over your usual need - Don't leave home without it !!  (think of it like the spare tire for your car)  Prednisone 10 mg take  4 each am x 2 days,   2 each am x 2 days,  1 each am x 2 days and stop      n't hurry or go uphills or steps s sob   Cough: sometimes around cleaning fluid / diesel    01/13/2019  f/u ov/Braden Deloach re:  Asthma/ vcd reduced gabapentin to 100 bid and ppi to qd and no change in cough  Chief Complaint  Patient presents with  . Follow-up    Breathing is overall doing well. She has not been using her albuterol regularly.   Dyspnea:  MMRC2 = can't walk a nl pace on a flat grade s sob but does fine slow and flat  X one month  Cough: better than usual  Sleeping:  3  pillows  SABA use: none  02: none Onset of new pain positional L  shoulder posteriorly x 2 weeks  > ortho eval years dx "muscle knots" same area  Better with heating pad / no neck pain, no radicular symptoms or numbness rec Increase gabapentin to 100  Mg four times daily until all cough and  pain are better  For muscle spasm try robaxin 750  four times a day as needed and call your othopedic doctor Doneen Poisson) for follow up [phone]   07/19/2019  f/u ov/Daleiza Bacchi re: asthma / vcd on pred daily from ortho / on dulera 200 2bid Chief Complaint  Patient presents with  . Follow-up    ptstates allergies and pollen aggravated sinus. wants handicap placard  Dyspnea: MMRC2 = can't walk a nl pace on a flat grade s sob but does fine slow and flat  Cough: none despite flare of nasal congestion Sleeping: on back/ 2 pillows/ bed is flat SABA use: minimal 02: none   No obvious day to day or daytime variability or assoc excess/ purulent sputum or mucus plugs or hemoptysis or cp or chest tightness, subjective wheeze or overt hb symptoms.   Sleeping as above  without nocturnal  or early am exacerbation  of respiratory  c/o's or need for noct saba. Also denies any obvious fluctuation of symptoms with weather or environmental changes or other aggravating or alleviating factors except as outlined above   No unusual exposure hx or h/o childhood pna/ asthma or knowledge of premature birth.  Current Allergies, Complete Past  Medical History, Past Surgical History, Family History, and Social History were reviewed in [employer] record.  ROS  The following are not active complaints unless bolded Hoarseness, sore throat, dysphagia, dental problems, itching, sneezing,  nasal congestion or discharge of excess mucus or purulent secretions, ear ache,   fever, chills, sweats, unintended wt loss or wt gain, classically pleuritic or exertional cp,  orthopnea pnd or arm/hand swelling  or leg swelling, presyncope, palpitations, abdominal pain, anorexia, nausea, vomiting, diarrhea  or change in bowel habits or change in bladder habits, change in stools or change in urine, dysuria, hematuria,  rash, arthralgias, visual complaints, headache, numbness, weakness or ataxia or problems with walking or coordination,  change in mood or  memory.        Current Meds  Medication Sig  . albuterol (PROVENTIL) (2.5 MG/3ML) 0.083% nebulizer solution Take 3 mLs (2.5 mg total) by nebulization every 6 (six) hours as needed for wheezing or shortness of breath (dx J45.991).  Marland Kitchen albuterol (VENTOLIN HFA) 108 (90 Base) MCG/ACT inhaler Inhale 2 puffs into the lungs every 6 (six) hours as needed for wheezing or shortness of breath.  Marland Kitchen amLODipine (NORVASC) 5 MG tablet Take 1 tablet (5 mg total) by mouth daily.  . diazepam (VALIUM) 5 MG tablet Take 1 by mouth 1 hour  pre-procedure with very light food. May bring 2nd tablet to appointment.  . gabapentin (NEURONTIN) 100 MG capsule Twice daily   . hydrochlorothiazide (MICROZIDE) 12.5 MG capsule Take 1 capsule (12.5 mg total) by mouth daily.  Marland Kitchen HYDROcodone-acetaminophen (NORCO/VICODIN) 5-325 MG tablet Take 1 tablet by mouth 3 (three) times daily as needed for moderate pain.  . mometasone-formoterol (DULERA) 200-5 MCG/ACT AERO Inhale 2 puffs into the lungs 2 (two) times daily.  . pantoprazole (PROTONIX) 40 MG tablet Take 1 tablet (40 mg total) by mouth 2 (two) times daily before a meal.  .  predniSONE (DELTASONE) 10 MG tablet Take 2 tablets at breakfast ( total of 20 mg) daily  . valsartan (DIOVAN) 40 MG tablet Take 1 tablet by mouth daily.                             Objective:   Physical Exam  amb somber bf nad  07/19/2019        173  01/13/2019      175  10/21/2018        172  09/01/2018          169 07/23/2018        170  05/04/2018         170  04/20/2018       169  04/08/2018       164  03/26/2018       173  03/11/2018       173  12/04/2017     170  11/04/2017       169  10/21/2017       169  02/06/2017     171   12/26/16 171 lb 6.4 oz (77.7 kg)  11/18/13 170 lb (77.1 kg)  03/21/11 171 lb (77.6 kg)      Vital signs reviewed  07/19/2019  - Note at rest 02 sats  94% on RA   HEENT : pt wearing mask not removed for exam due to covid -19 concerns.    NECK :  without JVD/Nodes/TM/ nl carotid upstrokes bilaterally   LUNGS: no acc muscle use,  Nl contour chest which is clear to A and P bilaterally without cough on insp or exp maneuvers   CV:  RRR  no s3 or murmur or increase in P2, and no edema   ABD:  soft and nontender with nl inspiratory excursion in the supine position. No bruits or organomegaly appreciated, bowel sounds nl  MS:  Nl gait/ ext warm without deformities, calf tenderness, cyanosis or clubbing No obvious joint restrictions   SKIN: warm and dry without lesions    NEURO:  alert, approp, nl sensorium with  no motor or cerebellar deficits apparent.                          Assessment:

## 2019-07-19 NOTE — Patient Instructions (Addendum)
No change medications  Please schedule a follow up visit in 3 months but call sooner if needed  

## 2019-07-20 ENCOUNTER — Encounter: Payer: Self-pay | Admitting: Internal Medicine

## 2019-07-20 NOTE — Assessment & Plan Note (Addendum)
Poor control despite pred daily per ortho   rec otc's prn          Each maintenance medication was reviewed in detail including emphasizing most importantly the difference between maintenance and prns and under what circumstances the prns are to be triggered using an action plan format where appropriate.  Total time for H and P, chart review, counseling, teaching device and generating customized AVS unique to this office visit / charting = 20 min

## 2019-07-20 NOTE — Assessment & Plan Note (Signed)
Recurrent cough since teenager really bad and daily since Aug 2018 12/26/2016  rec symbicort 80 2bid then return for full pfts - PFT's  02/06/2017  FEV1 1.37 (80 % ) ratio 67  p 5 % improvement from saba p symb 80  prior to study with DLCO  66/66c % corrects to 79  % for alv volume   - Spirometry 10/21/2017  FEV1 1.18 (70%)  Ratio 71 with min curvature during attack of cough/ mostly pseudowheeze on exam on symb 80 x2  - FENO 10/21/2017  =   7  On symb 80 x 2  - 11/04/2017     90% with spacer  - singulair added 11/04/2017  But not taking consistently as of 12/04/2017 > rec restart daily  X one week and stopped it due "dizzy"  - PFT's  12/04/2017  FEV1 1.34 (85 % ) ratio 67  p 20 % improvement from saba p nothing prior to study with DLCO  71 % corrects to 80  % for alv volume   - Allergy profile 12/04/2017 >  Eos 0.0 /  IgE  546 dust only  - cyclical cough rx 03/11/2018  - Spirometry 03/26/2018  FEV1 1.1 (73%)  Ratio 0.64 - 03/26/2018  After extensive coaching inhaler device,  effectiveness =    90% s spacer  - Gabapentin 100 mg tid trial 03/26/2018> attempted rx by non-adherent  - Sinus CT 04/07/2018 > Negative study.  No evidence of paranasal sinusitis. - Spirometry 04/08/2018  FEV1 0.7 (47%)  Ratio 0.52 with convex curvature p am symb 80 x 2 and neb in office   - 04/08/2018  After extensive coaching inhaler device,  effectiveness =    75% with smi > added trial of spiriva 2pffs daily x 2 weeks then return  - CTa 04/11/18 for hemoptysis with refractory cough > Limited study due to respiratory motion. No obvious pulmonary embolism or other acute findings. - 04/20/2018  After extensive coaching inhaler device,  effectiveness =    0% with spiriva smi which caused immediate severe cough so rec d/c spiriva  - 04/20/2018 rechallenge with gabapentin 100 mg bid  - 05/04/2018  After extensive coaching inhaler device,  effectiveness =    90% with spacer - s spacer starts coughing immediately  - Spirometry 05/04/2018  FEV1  1.2 (75%)  Ratio 0.65 with mild curvature p symb 80 x 2 puffs   - 05/04/2018 increased gabapentin to 100 tid   - 05/25/2018 increased gabapentin to  100 mg qid > improved  - 07/23/2018 try ppi just in am  07/23/2018  After extensive coaching inhaler device,  effectiveness =    90% with spacer from baseline 75%  - 09/01/2018 changed to dulera 100 due to cost   - 03/23/2019 changed to dulera 200 2bid    All goals of chronic asthma control met including optimal function and elimination of symptoms with minimal need for rescue therapy.  Contingencies discussed in full including contacting this office immediately if not controlling the symptoms using the rule of two's.    

## 2019-08-03 ENCOUNTER — Ambulatory Visit (INDEPENDENT_AMBULATORY_CARE_PROVIDER_SITE_OTHER): Payer: BC Managed Care – PPO | Admitting: Physical Medicine and Rehabilitation

## 2019-08-03 ENCOUNTER — Ambulatory Visit: Payer: Self-pay

## 2019-08-03 ENCOUNTER — Encounter: Payer: Self-pay | Admitting: Physical Medicine and Rehabilitation

## 2019-08-03 ENCOUNTER — Other Ambulatory Visit: Payer: Self-pay

## 2019-08-03 VITALS — BP 153/92 | HR 81

## 2019-08-03 DIAGNOSIS — M5412 Radiculopathy, cervical region: Secondary | ICD-10-CM | POA: Diagnosis not present

## 2019-08-03 MED ORDER — METHYLPREDNISOLONE ACETATE 80 MG/ML IJ SUSP
40.0000 mg | Freq: Once | INTRAMUSCULAR | Status: AC
  Administered 2019-08-03: 40 mg

## 2019-08-03 NOTE — Progress Notes (Signed)
Pt states pain on the left side of the neck, left shoulder, and down into the back of the left arm. Pt states pain, numbness, and tingling in the left hand (pinky, ring finger,a dn middle finger). Pt states PT sessions that has helped out a lot along with heat. Bending over makes pain worse. Pt states pain started months ago.  .Numeric Pain Rating Scale and Functional Assessment Average Pain 8   In the last MONTH (on 0-10 scale) has pain interfered with the following?  1. General activity like being  able to carry out your everyday physical activities such as walking, climbing stairs, carrying groceries, or moving a chair?  Rating(8)   +Driver, -BT, -Dye Allergies.

## 2019-08-05 NOTE — Procedures (Signed)
Cervical Epidural Steroid Injection - Interlaminar Approach with Fluoroscopic Guidance  Patient: Sue Mitchell      Date of Birth: 1955/04/20 MRN: [id number] PCP: Renaye Rakers, MD      Visit Date: 08/03/2019   Universal Protocol:    Date/Time: 06/10/216:09 AM  Consent Given By: the patient  Position: PRONE  Additional Comments: Vital signs were monitored before and after the procedure. Patient was prepped and draped in the usual sterile fashion. The correct patient, procedure, and site was verified.   Injection Procedure Details:  Procedure Site One Meds Administered:  Meds ordered this encounter  Medications  . methylPREDNISolone acetate (DEPO-MEDROL) injection 40 mg     Laterality: Left  Location/Site: C7-T1  Needle size: 20 G  Needle type: Touhy  Needle Placement: Paramedian epidural space  Findings:  -Comments: Excellent flow of contrast into the epidural space.  Procedure Details: Using a paramedian approach from the side mentioned above, the region overlying the inferior lamina was localized under fluoroscopic visualization and the soft tissues overlying this structure were infiltrated with 4 ml. of 1% Lidocaine without Epinephrine. A # 20 gauge, Tuohy needle was inserted into the epidural space using a paramedian approach.  The epidural space was localized using loss of resistance along with lateral and contralateral oblique bi-planar fluoroscopic views.  After negative aspirate for air, blood, and CSF, a 2 ml. volume of Isovue-250 was injected into the epidural space and the flow of contrast was observed. Radiographs were obtained for documentation purposes.   The injectate was administered into the level noted above.  Additional Comments:  The patient tolerated the procedure well Dressing: 2 x 2 sterile gauze and Band-Aid    Post-procedure details: Patient was observed during the procedure. Post-procedure instructions were reviewed.  Patient left the  clinic in stable condition.

## 2019-08-05 NOTE — Progress Notes (Signed)
Sue Mitchell - 64 y.o. female MRN [id number]  Date of birth: [birth year]  Office Visit Note: Visit Date: 08/03/2019 PCP: Lucianne Lei, MD Referred by: Lucianne Lei, MD  Subjective: Chief Complaint  Patient presents with  . Neck - Pain  . Left Shoulder - Pain  . Left Arm - Pain, Numbness, Tingling   HPI:  Sue Mitchell is a 64 y.o. female who comes in today for planned Left C7-T1 cervical epidural steroid injection with fluoroscopic guidance.  The patient has failed conservative care including home exercise, medications, time and activity modification.  This injection will be diagnostic and hopefully therapeutic.  Please see requesting physician notes for further details and justification.  She is followed by Dr. Anderson Malta.  She been having severe left neck and shoulder and arm pain.  Seems to be a classic C6 distribution.  MRI reviewed with the patient today and reviewed below in the notes.  Does show C5-6 multifactorial issues with uncovertebral joint hypertrophy disc protrusion and foraminal narrowing really bilaterally.  No high-grade central stenosis.   ROS Otherwise per HPI.  Assessment & Plan: Visit Diagnoses:  1. Cervical radiculopathy     Plan: No additional findings.   Meds & Orders:  Meds ordered this encounter  Medications  . methylPREDNISolone acetate (DEPO-MEDROL) injection 40 mg    Orders Placed This Encounter  Procedures  . XR C-ARM NO REPORT  . Epidural Steroid injection    Follow-up: No follow-ups on file.   Procedures: No procedures performed  Cervical Epidural Steroid Injection - Interlaminar Approach with Fluoroscopic Guidance  Patient: Sue Mitchell      Date of Birth: Oct 20, 1955 MRN: [id number] PCP: Lucianne Lei, MD      Visit Date: 08/03/2019   Universal Protocol:    Date/Time: 06/10/216:09 AM  Consent Given By: the patient  Position: PRONE  Additional Comments: Vital signs were monitored before and after the  procedure. Patient was prepped and draped in the usual sterile fashion. The correct patient, procedure, and site was verified.   Injection Procedure Details:  Procedure Site One Meds Administered:  Meds ordered this encounter  Medications  . methylPREDNISolone acetate (DEPO-MEDROL) injection 40 mg     Laterality: Left  Location/Site: C7-T1  Needle size: 20 G  Needle type: Touhy  Needle Placement: Paramedian epidural space  Findings:  -Comments: Excellent flow of contrast into the epidural space.  Procedure Details: Using a paramedian approach from the side mentioned above, the region overlying the inferior lamina was localized under fluoroscopic visualization and the soft tissues overlying this structure were infiltrated with 4 ml. of 1% Lidocaine without Epinephrine. A # 20 gauge, Tuohy needle was inserted into the epidural space using a paramedian approach.  The epidural space was localized using loss of resistance along with lateral and contralateral oblique bi-planar fluoroscopic views.  After negative aspirate for air, blood, and CSF, a 2 ml. volume of Isovue-250 was injected into the epidural space and the flow of contrast was observed. Radiographs were obtained for documentation purposes.   The injectate was administered into the level noted above.  Additional Comments:  The patient tolerated the procedure well Dressing: 2 x 2 sterile gauze and Band-Aid    Post-procedure details: Patient was observed during the procedure. Post-procedure instructions were reviewed.  Patient left the clinic in stable condition.     Clinical History: MRI CERVICAL SPINE WITHOUT CONTRAST  TECHNIQUE: Multiplanar, multisequence MR imaging of the cervical spine was performed. No intravenous contrast was administered.  COMPARISON:  None  FINDINGS: Alignment: Straightening of the cervical spine  Vertebrae: No fracture, evidence of discitis, or bone lesion.  Cord: Normal  signal and morphology.  Posterior Fossa, vertebral arteries, paraspinal tissues: Negative.  Disc levels:  C2-3: Unremarkable.  C3-4: Minor disc bulging.  No impingement  C4-5: Minor rightward disc bulging.  No impingement  C5-6: Disc narrowing and bulging with uncovertebral spurring. There is bilateral paracentral protrusion with prominent left C6 impingement at the foraminal entry. Mild spinal stenosis. The right-sided protrusion could affect the ventral nerve root of C6.  C6-7: Disc narrowing and endplate degeneration with uncovertebral ridging asymmetric to the left. Left foraminal impingement is high-grade. Mild spinal stenosis  C7-T1:Unremarkable.  IMPRESSION: 1. C5-6 disc degeneration with left paracentral protrusion impinging on the C6 nerve root at the foraminal entry. There is also a right paracentral protrusion at this level that could contact the ventral, intradural nerve root of C6. 2. C6-7 disc degeneration with left foraminal impingement.   Electronically Signed   By: Marnee Spring M.D.   On: 07/04/2019 13:56     Objective:  VS:  HT:    WT:   BMI:     BP:(!) 153/92  HR:81bpm  TEMP: ( )  RESP:  Physical Exam Vitals and nursing note reviewed.  Constitutional:      General: She is not in acute distress.    Appearance: Normal appearance. She is not ill-appearing.  HENT:     Head: Normocephalic and atraumatic.     Right Ear: External ear normal.     Left Ear: External ear normal.  Eyes:     Extraocular Movements: Extraocular movements intact.  Cardiovascular:     Rate and Rhythm: Normal rate.     Pulses: Normal pulses.  Musculoskeletal:     Cervical back: Tenderness present. No rigidity.     Right lower leg: No edema.     Left lower leg: No edema.     Comments: Patient has good strength in the upper extremities including 5 out of 5 strength in wrist extension long finger flexion and APB.  There is no atrophy of the hands  intrinsically.  There is a negative Hoffmann's test.   Lymphadenopathy:     Cervical: No cervical adenopathy.  Skin:    Findings: No erythema, lesion or rash.  Neurological:     General: No focal deficit present.     Mental Status: She is alert and oriented to person, place, and time.     Sensory: No sensory deficit.     Motor: No weakness or abnormal muscle tone.     Coordination: Coordination normal.  Psychiatric:        Mood and Affect: Mood normal.        Behavior: Behavior normal.      Imaging: No results found.

## 2019-08-20 ENCOUNTER — Telehealth: Payer: Self-pay | Admitting: Internal Medicine

## 2019-08-20 MED ORDER — PREDNISONE 10 MG PO TABS: ORAL_TABLET | ORAL | 0 refills | Status: DC

## 2019-08-20 NOTE — Telephone Encounter (Signed)
Spoke with pt. She is aware of MW's response. Rx has been sent in per MW. Nothing further was needed.

## 2019-08-20 NOTE — Telephone Encounter (Signed)
Spoke with pt. States that she is not feeling well. Reports increased coughing and wheezing. Cough is producing clear mucus at this time. Denies chest tightness, shortness of breath, fever/chills or sick contacts. Symptoms started yesterday and have progressively gotten worse. Pt would like MW's recommendations.  MW - please advise. Thanks.

## 2019-08-20 NOTE — Telephone Encounter (Signed)
Prednisone 10 mg take  4 each am x 2 days,   2 each am x 2 days,  1 each am x 2 days and stop   Needs ov next week if not doing better, to ER in meantim if worse

## 2019-08-24 ENCOUNTER — Telehealth: Payer: Self-pay | Admitting: Internal Medicine

## 2019-08-24 ENCOUNTER — Encounter: Payer: Self-pay | Admitting: Internal Medicine

## 2019-08-24 ENCOUNTER — Ambulatory Visit (INDEPENDENT_AMBULATORY_CARE_PROVIDER_SITE_OTHER): Payer: BC Managed Care – PPO | Admitting: Internal Medicine

## 2019-08-24 ENCOUNTER — Other Ambulatory Visit: Payer: Self-pay

## 2019-08-24 DIAGNOSIS — J45991 Cough variant asthma: Secondary | ICD-10-CM

## 2019-08-24 MED ORDER — PREDNISONE 10 MG PO TABS: ORAL_TABLET | ORAL | 0 refills | Status: DC

## 2019-08-24 MED ORDER — GABAPENTIN 100 MG PO CAPS: ORAL_CAPSULE | ORAL | Status: DC

## 2019-08-24 NOTE — Patient Instructions (Addendum)
Gabapentin 100 x 2  Four times a day   Increase dulera 200 mg Take 2 puffs first thing in am and then another 2 puffs about 12 hours later.   Prednisone 10 mg Take 4 for three days 3 for three days 2 for three days 1 for three days and stop   Protonix 40 mg Take 30-60 min before first meal of the day   Please schedule a follow up office visit in 4 weeks, sooner if needed  with all medications /inhalers/ solutions in hand so we can verify exactly what you are taking. This includes all medications from all doctors and over the counters

## 2019-08-24 NOTE — Progress Notes (Addendum)
Subjective:    Patient ID: Sue Mitchell, female   DOB: 1955/07/12     MRN: [id number]   Brief patient profile:  16 yobf  RT quit smoking 2011 @ wt 140  with h/o sinus symptoms assoc with cough and need for saba prn as teenager while living in [place] but after arrival here at age late 63's it changed to point where bothered her mostly with onset of cold weather typically req ov rx  abx and prednisone help a lot and this pattern continued even after quit smoking to where the episodes last longer and onset of this not directed linked to cold weather in fact  had one April 2018 never  Completely cleared  then flared again in Oct 01 2016 severe dry cough / eval by allergist /ent since onset neg w/u so referred by Dr Blenda Nicely to pulmonary clinic 12/26/16     History of Present Illness  12/26/2016 1st [facility] office visit/ Sue Mitchell   Chief Complaint  Patient presents with  . Pulm Consult    Pt referred by Dr. Lind Guest ENT. Pt has productive cough-clear mucus sometimes yellow. Pt had horseness in voice for 6-8 weeks, no chills or fever, have some chest pain and tightness with cough.  recurrent cough since April 2018 never resolved p rx as uri then flaired early August 2018 rx zpak/pred/saba> some better  Cough was worse at hs now   Am feels wheezy not really coughing much up but what she does is white and < 1 tbsp Cough seems worse with exp to cold air at work  Assoc overt hb better on zantac  Some gen ant chest discomfort with severe coughing fits  rec Plan A = Automatic =  symbicort 80 Take 2 puffs first thing in am and then another 2 puffs about 12 hours later.  Plan B = Backup Only use your albuterol as a rescue medication  Start zantac 150- 300 mg after bfast and after supper until cough better  GERD diet    02/06/2017  f/u ov/Sue Mitchell re:  GOLD I copd/ cough on cold air exp  Chief Complaint  Patient presents with  . Follow-up    Cough had improved but then worsened again  with colder weather. She is using her albuterol inhaler 2 x per wk on average.   not limited by doe / steps ok except for knees  Some gerd at hs taking h2 am only  rec Pantoprazole (protonix) 40 mg  Take  30-60 min before first meal of the day and Zantac  300 mg bedtime until return to office - this is the best way to tell whether stomach acid is contributing to your problem.   GERD diet  Please schedule a follow up office visit in 6 weeks, call sooner if needed     10/21/2017  f/u ov/Sue Mitchell re: acute refractory cough / saw allergist cannot name "all neg"  ? [name]?  Chief Complaint  Patient presents with  . Follow-up    Last seen by [facility] 02/06/17. Patient states she has had a productive cough, post nasal drip, and constantly using her voice. At night, she becomes so congested that she has to breathe through her mouth instead of nose.    from last ov until 09/25/17 fine on on ranitidine one bid/symb 80 2bid Never took protonix  Was still needing saba twice a week at baseline and a lot more while working at [employer]  Then abruptly  worse Aug 1  lots sinus drainage clear > zpak early in Aug by [name] some better for a week or two then worse says ran out of symbicort one day prior to ov / using lots of saba now and severe nasal congestion as well as hoarseness and can't sleep due to cough rec Plan A = Automatic = symbiocort 80 Take 2 puffs first thing in am and then another 2 puffs about 12 hours later.  Plan B = Backup Only use your albuterol as a rescue medication  Prednisone 10 mg take  4 each am x 2 days,   2 each am x 2 days,  1 each am x 2 days and stop  Pantoprazole (protonix) 40 mg   Take  30-60 min before first meal of the day and ranitidine 300 mg each evening GERD diet      11/04/2017  f/u ov/Sue Mitchell re: atypical asthma in RT  Chief Complaint  Patient presents with  . Follow-up    Pt c/o sneezing, wheezing and cough with clear sputum- started after she mowed her lawn 1 day ago. She  has had to use her albuterol inhaler.   wakes up feeling good most am's s cough/ wheeze about 30 min later takes first 2 pffs of symb 80 around 5 15 am  Then next 2 pffs after 9 pm  Had been doing great prior mowing grass one day prior to OV Rarely need saba since last flare    Not limited by breathing from desired activities   rec Continue symbicort 80 Take 2 puffs first thing in am and then another 2 puffs about 12 hours later thru spacer Add singulair 10 mg each pm    12/04/2017  f/u ov/Sue Mitchell re:   symb 80 2bid/ singualir sometimes  Chief Complaint  Patient presents with  . Follow-up    PFT today, continues to have cough with hoarseness. She uses her albuterol inhaler 1 x per wk on average.   Dyspnea:  fine Cough: p grass exp / chemicals/ cologne dry Sleeping: flat/ one pillow no noct symptms SABA use: rare 02: none   Flare of cough / drainage 12/01/17 > has not tried otc's Plan A = Automatic = symbicort 80 Take 2 puffs first thing in am and then another 2 puffs about 12 hours later and remember to breath it out through your nose  singulair  10 mg every evening  Plan B = Backup Only use your albuterol as a rescue medication  For nasal symptoms > zyrtec 10 mg on at bedtime as needed > too sleepy    02/20/18 NP ov rec Augmentin/ pred/ antihistamine/singulair > no better and could not take singulair / did not try zyrtec in am "makes me sleepy"     03/11/2018 extended  f/u ov/Sue Mitchell re: refractory  Cough since aug 2018    Chief Complaint  Patient presents with  . Follow-up    Cough not improving. She is coughing up clear to white sputum. She states her cough is worse when she goes to work and when she first lies down at night. She is using her albuterol inhaler once daily on average.   Dyspnea:  Not limited by breathing from desired activities   Cough: worse around 5pm  Then immediately when supine x then some during the night, never took zyrtec at hs as rec with sense of globus and  throat / chest "congestion" but no excess mucus  Sleeping: on flat bed on side  SABA use: ?  if neb helps more than hfa - not really  Sure but "it sure makes me shake" Still using mint products  rec Symbicort 80  Take 2 puffs first thing in am and then another 2 puffs about 12 hours later.  Pantoprazole (protonix) 40 mg   Take  30-60 min before first meal of the day and Pepcid (famotidine)  20 mg one @  bedtime until return to office - this is the best way to tell whether stomach acid is contributing to your problem.   GERD diet  Try zyrtec 10 mg after supper  Take delsym two tsp every 12 hours and supplement if needed with  vicodin up to 1every 4 hours to suppress the urge to cough. Swallowing water and/or using ice chips/non mint and menthol containing candies (such as lifesavers or sugarless jolly ranchers) are also effective.  You should rest your voice and avoid activities that you know make you cough. Once you have eliminated the cough for 3 straight days try reducing the vicodin first,  then the delsym as tolerated.   On 03/14/18 Prednisone 10 mg take  4 each am x 2 days,   2 each am x 2 days,  1 each am x 2 days and stop  Please schedule a follow up office visit in 2 weeks, sooner if needed  with all medications /inhalers/ solutions in hand so we can verify exactly what you are taking. This includes all medications from all doctors and over the counters    03/26/2018  f/u ov/Sue Mitchell re: refractory cough/ wheeze on symb 80 2bid  Chief Complaint  Patient presents with  . Follow-up    Pt c/o sinus congestion, cough with yellow sputum and wheezing. She is using her albuterol inhaler once per wk on average.   Dyspnea:  Ok unless cough Cough:  at work and at home same severity/ frequency  Sleeping: not coughing while sleeping  SABA use:  Not really helping 02: none  gen ant chest soreness from coughing/ not able to use vicodin daytime to suppress cough rec  Only use your albuterol as a rescue  medication  Please see patient coordinator before you leave today  to schedule sinus CT Please remember to go to the  x-ray department  for your tests - we will call you with the results when they are available  Please schedule a follow up office visit in 2 weeks, sooner if needed  with all medications /inhalers/ solutions in hand so we can verify exactly what you are taking. This includes all medications from all doctors and over the counters       04/08/2018  f/u ov/Sue Mitchell re: refractory cough / brought just her inhalers to [facility]   Chief Complaint  Patient presents with  . Acute Visit    Pt has had complaints of sinius drainage and a cough x4 days and also states she has had problems sleeping. Pt also has been hurting everywhere, feeling weak, and has had sweats.  Dyspnea: usually only sob when coughing Cough: white mucus / min production "but feels like it's choking her"  Sleeping: on side bed pillows too heavy for bed blocks/ worse cough now at hs / not using cough suppression as advised  SABA use: rarely / symbicort count on 50 so missed about 14 doses or continued to use beyond the red line on the prior/ warned about both 02: none Reported after much back and forth "felt good only while on prednisone" and gradually worse  off it  - turns out "felt good" does not mean the cough was gone but breathing was better. rec Prednisone 10mg   Take 4 for three days 3 for three days 2 for three days 1 for three days and stop Plan A = Automatic = Symbicort 80 Take 2 puffs first thing in am and then another 2 puffs about 12 hours later and spiriva 2 puff each am  Plan B = Backup Only use your albuterol inhaler as a rescue medication   Try gabapentin 100 mg twice daily bfast and supper and if can't take it twice daily just take at bedtime  For drainage / throat tickle try take CHLORPHENIRAMINE  4 mg (chlortabs  [facility])   Stay on protonix Take 30- 60 min before your first and last meals of the day   Take delsym two tsp every 12 hours and supplement if needed with  vicodine  up to 2 every 4 hours to suppress the urge to cough. .  Please schedule a follow up office visit in 2 weeks, sooner if needed  with all medications /inhalers/ solutions in hand so we can verify exactly what you are taking. This includes all medications from all doctors and over the counters - needs spirometry on return     Admit date: 04/14/2018 Discharge date: 04/18/2018   Equipment/Devices: Nebulizer with albuterol sol'n, 2L oxygen ordered several days prior to discharge. Apparently patient left stating she would get the nebulizer and oxygen from [facility] and did not want to wait for it to be delivered to the room per care management. Per CM note, this will be delivered to the home.   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 At the time of discharge she reportedly maintained sufficient oxygenation during ambulation, but in the ED she was found to be hypoxic, wheezing, coughing, and weak, and was subsequently readmitted. With continued treatment including IV steroids she's subjectively significantly improved. She was weaned from oxygen while at rest and still desaturates on ambulation. She is discharged with DME in stable condition with follow up in 48 hours.  Discharge Diagnoses:  Principal Problem:   Acute respiratory failure with hypoxia (HCC)   Cough variant asthma  vs uacs wit vcd   Influenza   Cough  Acute hypoxic respiratory failure due to influenza A and asthma exacerbation:  - Completed5 days of tamiflu - Continue symbicort, spiriva respimat - Scheduled and prnalbuterol nebulizer (provided at DC) -Wheezing has improved so converted to po steroid with improvement, has prednisone at home, ordered to  take 40mg  po daily until follow up.  - Continue supplemental oxygen to maintain SpO2 >90%. Despite significant improvement, does still need O2 at discharge. Will anticipate continued improvement and liberation from oxygen, will need recheck at follow up office visit with pulmonology the coming week. - Continue antitussive. PMPAware queried, has gotten hydrocodone-APAP from Dr. 3/18 2/12 and cough syrup intermittently from PCP.   Hemoptysis: CTA chest motion-degraded at last admission, no PE.  - Resolved.   Hypertension:  - Continue norvasc and HCTZ since recently stopping ARB for concern of exacerbating cough.   GERD:  - Continue PPI    04/20/2018  Extended f/u ov/Sue Mitchell re: post hosp f/u  Chief Complaint  Patient presents with  . Follow-up    Breathing has improved. She is not using her  albuterol inhaler but is using albuterol neb about 3 x per day.   Dyspnea:  MMRC2 = can't walk a nl pace on a flat grade s sob but does fine slow and flat  Cough: gone/ min urge to clear throat but now on tussionex Sleeping: side / pillows to prop up  SABA use: way over using as above  02: none at all  rec Continue pantoprazole 40 mg Take 30- 60 min before your first and last meals of the day  Gabapentin  Restart 100 mg twice daily bfast and bedtime Plan A = Automatic = Symbicort 80 Take 2 puffs first thing in am and then another 2 puffs about 12 hours later.  Work on inhaler technique:   Plan B = Backup Only use your albuterol inhaler as a rescue medication  Plan C = Crisis - only use your albuterol nebulizer if you first try Plan B and it fails to help > ok to use the nebulizer up to every 4 hours but if start needing it regularly call for immediate appointment Finish the prednisone and stop spiriva F/u in 2 weeks with meds in hand (#35 on present symb 80 and one sample given) Add:  Consider performist/bud neb if can't wean off pred/saba neb and hfa makes her cough worse due to effects on upper  airway    05/04/2018  f/u ov/Sue Mitchell re:   symbicort 80 one full at home and total of 45 puffs samples/ 32 gabapenitn  Chief Complaint  Patient presents with  . Acute Visit    Cough has improved but has not resolved. She is coughing up min, clear sputum. She has not had to use her albuterol inhaler.   Dyspnea:  MMRC1 = can walk nl pace, flat grade, can't hurry or go uphills or steps s sob   Cough: p exertion Sleeping: fine 30 degrees on pillows  SABA use: no saba  02: no rec Increase gabapentin to 100 mg three times a day  ok to return to work 05/16/2018  Please schedule a follow up office visit in 3 weeks, sooner if needed  with all medications /inhalers/ solutions in hand so we can verify exactly what you are taking. This includes all medications from all doctors and over the counters        History of Present Illness: 05/25/2018   televist/Sue Mitchell re: cough/ sob symbicort 80 2bid /still on samples "half full"  Dyspnea:  Walked for 5 minutes some inclines  Cough: some daytime / dry assoc with hoarseness  Sleeping: flat bed/ 3 pillows  SABA use: none   rec For drainage / throat tickle try take CHLORPHENIRAMINE  4 mg (chlortab 4 mg) - take one every 4 hours as needed - available over the counter- may cause drowsiness so start with just a bedtime dose or two and see how you tolerate it before trying in daytime   Increase gabapentin to 100 mg four times daily  Continue Symbicort 80 Take 2 puffs first thing in am and then another 2 puffs about 12 hours later (use coupon and let me know if they won't honor it  I will take you out of work until end of April 2020      06/19/18 Defer work note to Dr Parke Simmers re back For drainage / throat tickle try take CHLORPHENIRAMINE  4 mg  (Chlortab 4mg   at should be easiest to find in the green box)  take one every 4 hours as needed -  available over the counter- may cause drowsiness so start with just a bedtime dose or two and see how you  tolerate it before trying in daytime   Elevate head of bed on 6-8 inch bed block   07/23/2018  f/u ov/Sue Mitchell re: asthma with uacs  On symb 80 2bid , gapentin 100 qid /ppi bid Chief Complaint  Patient presents with  . Follow-up    Breathing is much improved. She is coughing minimally- mainly in the am's. She has not had to use her rescue inhaler in over a month.  Dyspnea:  Walks x 20 mins s stopping, some hills Cough: minimal/ non productive, day >noct  Sleeping: bed is flat  3pillows for back SABA use: none   rec No change in medications Ok to leave off pm dose of pantaprozole if not coughing Ok to return to work without restrictions on August 10 2018    09/01/2018  f/u ov/Sue Mitchell re: asthma  Vs vcd back on ppi bid / not keeping up with symbicort rx due to cost but did not contact us re this issue and just stopped taking  Chief Complaint  Patient presents with  . Follow-up    increased cough for a wk- prod with white sputum. She is also wheezing and has had to use her albuterol 4 x since her last visit.   Dyspnea:  Not limited by breathing from desired activities  / up and down steps  Cough: gradually worse x one week / min sputum mostly in ams Sleeping: at least once per night wakes up needing saba  SABA use: 3-4 x since 02: none  rec Plan A = Automatic = dulera 100 Take 2 puffs first thing in am and then another 2 puffs about 12 hours later.  Plan B = Backup Only use your albuterol (proair)  inhaler as a rescue medication to be used if you can't catch your breath by resting or doing a relaxed purse lip breathing pattern.  - The less you use it, the better it will work when you need it. - Ok to use the inhaler up to 2 puffs  every 4 hours if you must but call for appointment if use goes up over your usual need - Don't leave home without it !!  (think of it like the spare tire for your car)  Prednisone 10 mg take  4 each am x 2 days,   2 each am x 2 days,  1 each am x 2 days and stop          01/13/2019  f/u ov/Sue Mitchell re:  Asthma/ vcd reduced gabapentin to 100 bid and ppi to qd and no change in cough  Chief Complaint  Patient presents with  . Follow-up    Breathing is overall doing well. She has not been using her albuterol regularly.   Dyspnea:  MMRC2 = can't walk a nl pace on a flat grade s sob but does fine slow and flat  X one month  Cough: better than usual  Sleeping:  3  pillows  SABA use: none  02: none Onset of new pain positional L  shoulder posteriorly x 2 weeks  > ortho eval years dx "muscle knots" same area  Better with heating pad / no neck pain, no radicular symptoms or numbness rec Increase gabapentin to 100  Mg four times daily until all cough and pain are better  For muscle spasm try robaxin 750  four times a day as needed  and call your othopedic doctor Doneen Poisson(Sue Mitchell Stanek Hiltz) for follow up [phone]   07/19/2019  f/u ov/Dajai Wahlert re: asthma / vcd on pred daily from ortho / on dulera 200 2bid Chief Complaint  Patient presents with  . Follow-up    ptstates allergies and pollen aggravated sinus. wants handicap placard  Dyspnea: MMRC2 = can't walk a nl pace on a flat grade s sob but does fine slow and flat  Cough: none despite flare of nasal congestion Sleeping: on back/ 2 pillows/ bed is flat SABA use: minimal rec No change rx  Stopped Prednisone June 8th p back injection also reduced dulera to 100 and protonix to one q am   08/13/19 while says on gabapentin 100 mg twice daily increased to 100 mg qid    Virtual Visit via Telephone Note 08/24/2019   I connected with Sue Mitchell on 08/24/19 at  12:50 PM  by telephone and verified that I am speaking with the correct person using two identifiers.   Pt is at home and this call was placed from my office with no other participants on the call.   I discussed the limitations, risks, security and privacy concerns of performing an evaluation and management service by telephone and the availability of in person  appointments. I also discussed with the patient that there may be a patient responsible charge related to this service. The patient expressed understanding and agreed to proceed.   History of Present Illness: re asthma/vcd/ cough  Dyspnea:  Only if coughing  Cough: started around Oct 17 dry harsh  Sleeping: propped up 30 degrees  SABA use: per nebulizer works best for cough  02: none    No obvious day to day or daytime variability or assoc excess/ purulent sputum or mucus plugs or hemoptysis or cp or chest tightness, subjective wheeze or overt sinus or hb symptoms.    Also denies any obvious fluctuation of symptoms with weather or environmental changes or other aggravating or alleviating factors except as outlined above.   Meds reviewed/ med reconciliation completed     No outpatient medications have been marked as taking for the 08/24/19 encounter (Appointment) with Nyoka CowdenWert, Nikky Duba B, MD.         Observations/Objective: No sob at rest, minimal spont dry sounding cough, ok voice texture   Assessment and Plan: See problem list for active a/p's   Follow Up Instructions: See avs for instructions unique to this ov which includes revised/ updated med list     I discussed the assessment and treatment plan with the patient. The patient was provided an opportunity to ask questions and all were answered. The patient agreed with the plan and demonstrated an understanding of the instructions.   The patient was advised to call back or seek an in-person evaluation if the symptoms worsen or if the condition fails to improve as anticipated.  I provided 25 minutes of non-face-to-face time during this encounter.   Sandrea HughsMichael Garvey Westcott, MD

## 2019-08-24 NOTE — Telephone Encounter (Signed)
Spoke with pt. States that she still isn't feeling any better since her call last week. Reports increased coughing and wheezing. Cough is producing clear mucus at this time. Denies chest tightness, shortness of breath, fever/chills or sick contacts. She has 2 more days left of her prednisone taper that was given to her last week. States that she isn't getting any sleep because of her coughing.  MW - please advise. Thanks.

## 2019-08-24 NOTE — Telephone Encounter (Signed)
If we can't get her seen this week can set up a televisit with me as an add on but tell her I can't guarantee I will call her exactly at the time allotted but work it in sometime during lunch hour so give [place] a number she can be reached at

## 2019-08-24 NOTE — Telephone Encounter (Signed)
Spoke with pt. She is aware of Dr. Thurston Hole response. Advised pt to make herself available from 1200 - 1400. OV has been scheduled.

## 2019-08-24 NOTE — Assessment & Plan Note (Addendum)
Quit smoking 2011  Recurrent cough since teenager really bad and daily since Aug 2018 12/26/2016  rec symbicort 80 2bid then return for full pfts - PFT's  02/06/2017  FEV1 1.37 (80 % ) ratio 67  p 5 % improvement from saba p symb 80  prior to study with DLCO  66/66c % corrects to 79  % for alv volume   - Spirometry 10/21/2017  FEV1 1.18 (70%)  Ratio 71 with min curvature during attack of cough/ mostly pseudowheeze on exam on symb 80 x2  - FENO 10/21/2017  =   7  On symb 80 x 2  - 11/04/2017     90% with spacer  - singulair added 11/04/2017  But not taking consistently as of 12/04/2017 > rec restart daily  X one week and stopped it due "dizzy"  - PFT's  12/04/2017  FEV1 1.34 (85 % ) ratio 67  p 20 % improvement from saba p nothing prior to study with DLCO  71 % corrects to 80  % for alv volume   - Allergy profile 12/04/2017 >  Eos 0.0 /  IgE  546 dust only  - cyclical cough rx 03/11/2018  - Spirometry 03/26/2018  FEV1 1.1 (73%)  Ratio 0.64 - 03/26/2018  After extensive coaching inhaler device,  effectiveness =    90% s spacer  - Gabapentin 100 mg tid trial 03/26/2018> attempted rx by non-adherent  - Sinus CT 04/07/2018 > Negative study.  No evidence of paranasal sinusitis. - Spirometry 04/08/2018  FEV1 0.7 (47%)  Ratio 0.52 with convex curvature p am symb 80 x 2 and neb in office   - 04/08/2018  After extensive coaching inhaler device,  effectiveness =    75% with smi > added trial of spiriva 2pffs daily x 2 weeks then return  - CTa 04/11/18 for hemoptysis with refractory cough > Limited study due to respiratory motion. No obvious pulmonary embolism or other acute findings. - 04/20/2018  After extensive coaching inhaler device,  effectiveness =    0% with spiriva smi which caused immediate severe cough so rec d/c spiriva  - 04/20/2018 rechallenge with gabapentin 100 mg bid  - 05/04/2018  After extensive coaching inhaler device,  effectiveness =    90% with spacer - s spacer starts coughing immediately  -  Spirometry 05/04/2018  FEV1 1.2 (75%)  Ratio 0.65 with mild curvature p symb 80 x 2 puffs   - 05/04/2018 increased gabapentin to 100 tid   - 05/25/2018 increased gabapentin to  100 mg qid > improved  - 07/23/2018 try ppi just in am  07/23/2018  After extensive coaching inhaler device,  effectiveness =    90% with spacer from baseline 75%  - 09/01/2018  changed to dulera 100 due to cost   - 1/26/2021changed to dulera 200 2bid    Dec 13 2019 flare p changes doses of dulera/ protonix/ ? Gabapentin and stopped prednisone June 8 as back was injected and no longer needing  Of the three most common causes of  Sub-acute / recurrent or chronic cough, only one (GERD)  can actually contribute to/ trigger  the other two (asthma and post nasal drip syndrome)  and perpetuate the cylce of cough.  While not intuitively obvious, many patients with chronic low grade reflux do not cough until there is a primary insult that disturbs the protective epithelial barrier and exposes sensitive nerve endings.   This is typically viral but can due to PNDS and  either may apply here.     >>> The point is that once this occurs, it is difficult to eliminate the cycle  using anything but a maximally effective acid suppression regimen at least in the short run, accompanied by an appropriate diet to address non acid GERD and control / eliminate the cough itself with gabapentin titrate up to 200 qid.   Also added 12 days of Prednisone and increase dulera to 200 bid  in case of component of Th-2 driven upper or lower airways inflammation (if cough responds short term only to relapse befor return while will on rx for uacs that would point to allergic rhinitis/ asthma or eos bronchitis)    Each maintenance medication was reviewed in detail including most importantly the difference between maintenance and as needed and under what circumstances the prns are to be used.  Please see AVS for specific  Instructions which are unique to this visit and  I personally typed out  which were reviewed in detail over the phone with the patient and a copy provided via MyChar

## 2019-09-03 DIAGNOSIS — F410 Panic disorder [episodic paroxysmal anxiety] without agoraphobia: Secondary | ICD-10-CM | POA: Diagnosis not present

## 2019-09-03 DIAGNOSIS — I10 Essential (primary) hypertension: Secondary | ICD-10-CM | POA: Diagnosis not present

## 2019-09-03 DIAGNOSIS — M130 Polyarthritis, unspecified: Secondary | ICD-10-CM | POA: Diagnosis not present

## 2019-10-14 NOTE — Addendum Note (Signed)
Addended by: Sandrea Hughs B on: 10/14/2019 06:12 AM   Modules accepted: Orders

## 2020-01-24 ENCOUNTER — Telehealth: Payer: Self-pay | Admitting: Internal Medicine

## 2020-01-24 IMAGING — CR DG CHEST 2V
2 series · 2 of 2 positions shown · non-contrast
Comparison: 03/26/2018

CLINICAL DATA: Pt c/o chronic cough x 1 year, hemoptysis onset this
a.m. that was dark red. H/o HTN and Asthma.

EXAM:
CHEST - 2 VIEW

[w chest pa]
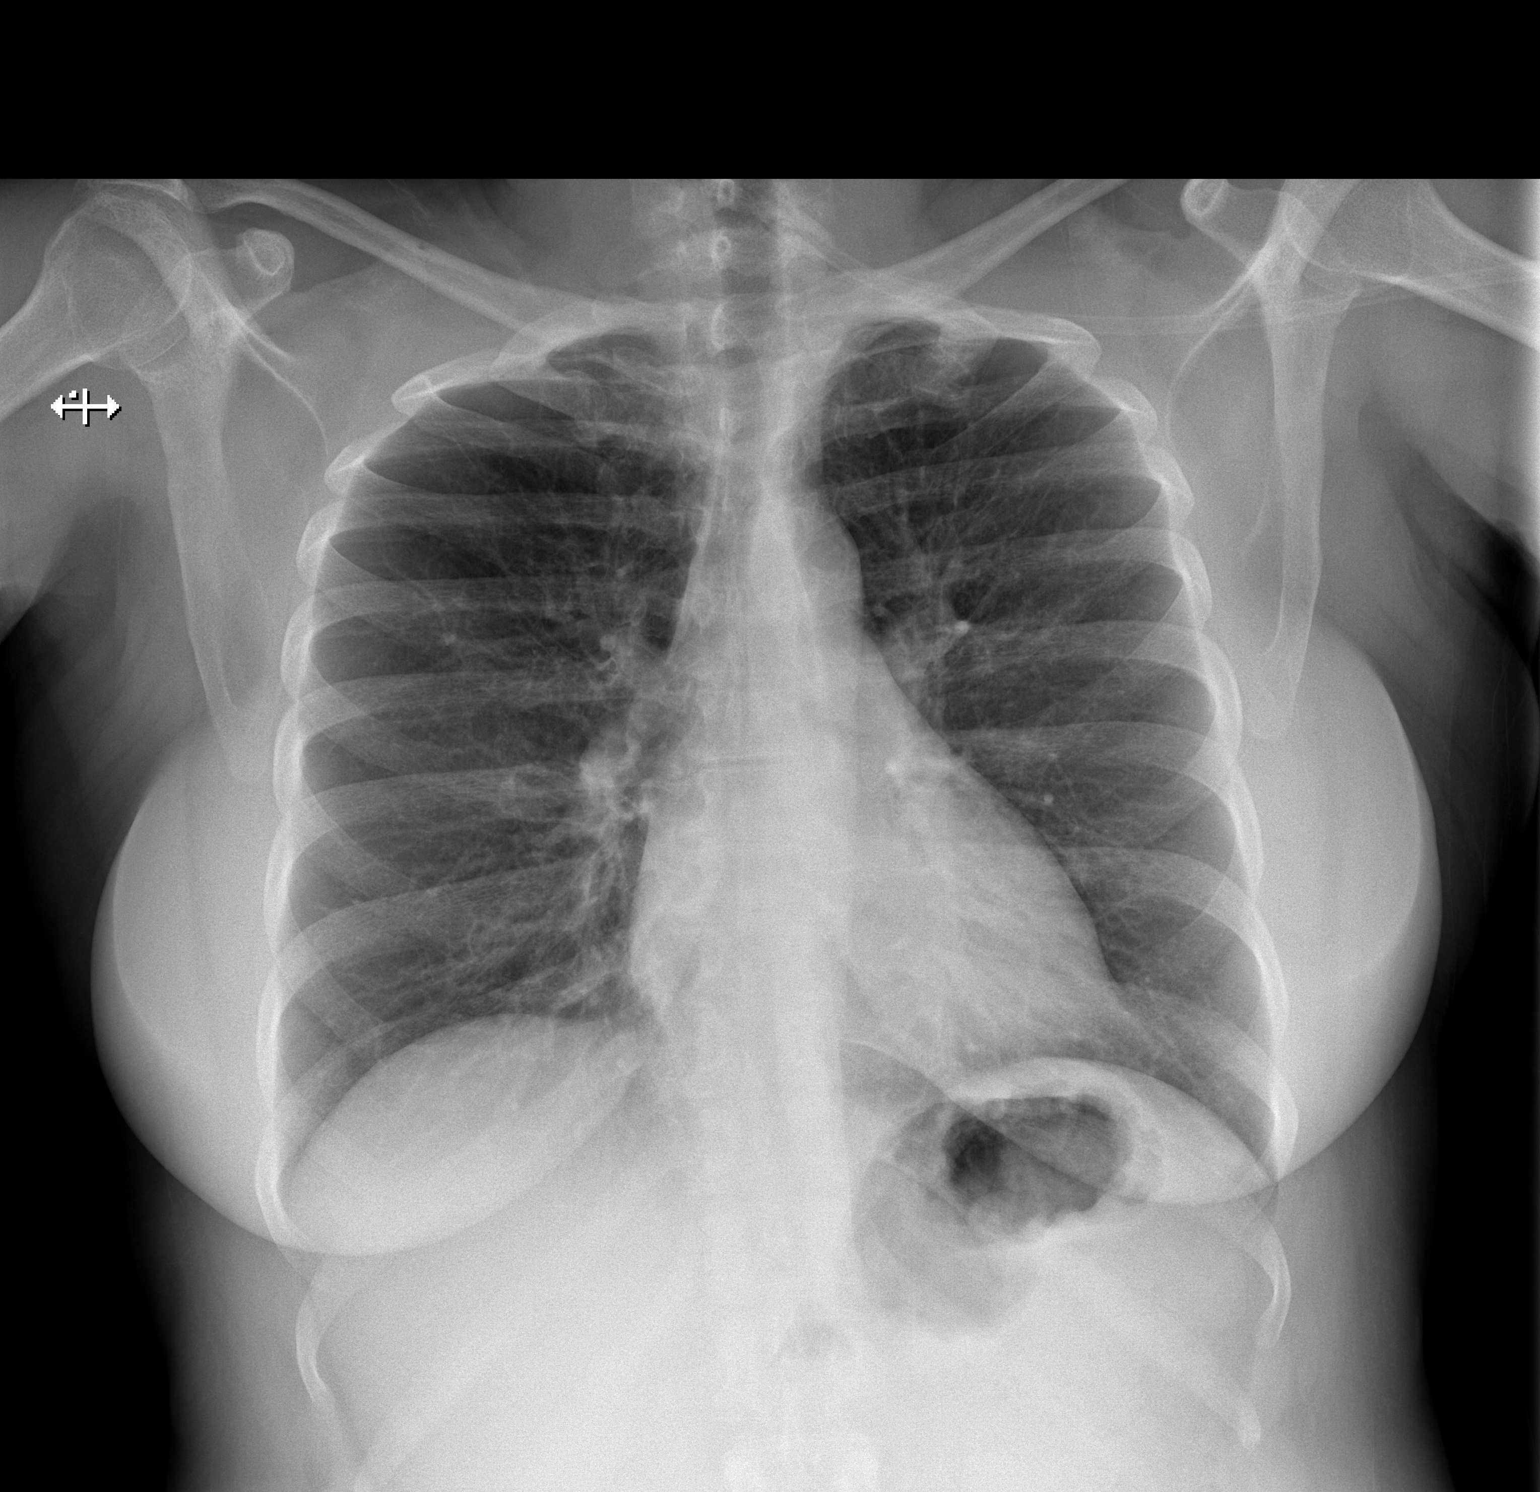

[w chest lat]
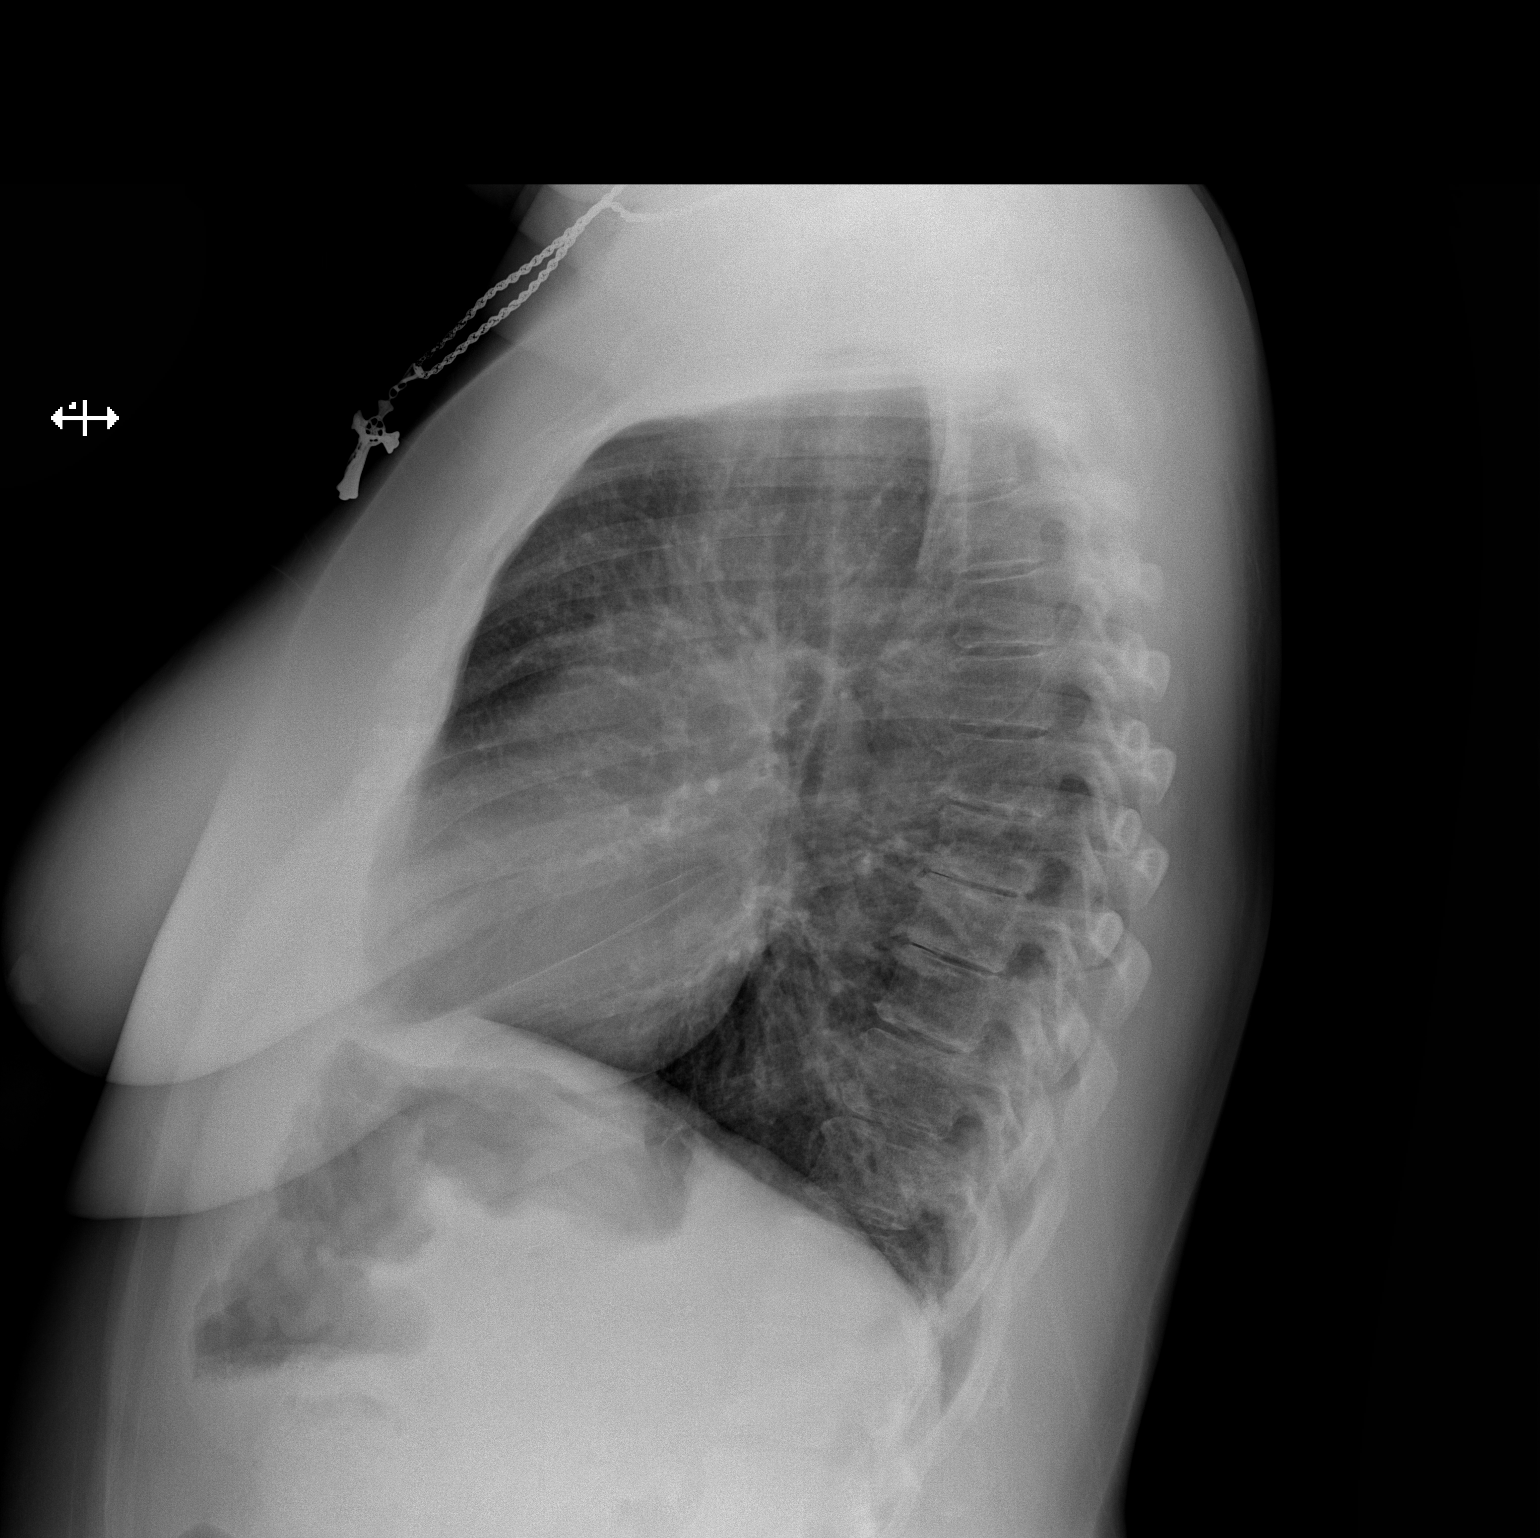

[2 of 2 positions shown; findings below may reference images not displayed]

FINDINGS: The heart size and mediastinal contours are within normal limits.
Both lungs are clear. No pleural effusion or pneumothorax. The
visualized skeletal structures are unremarkable.
IMPRESSION: No active cardiopulmonary disease.

## 2020-01-24 MED ORDER — PREDNISONE 10 MG PO TABS: ORAL_TABLET | ORAL | 0 refills | Status: DC

## 2020-01-24 NOTE — Telephone Encounter (Signed)
Spoke with pt and advised of Dr Thurston Hole recommendations. Rx sent to pharmacy. 2 week f/u appt scheduled. Pt verbalized understanding. Nothing further needed.

## 2020-01-24 NOTE — Telephone Encounter (Signed)
Called and spoke with pt and she stated that she feels that this is an asthma flare and it started about 1.5 week ago.  She is having cough with clear sputum, and is congested and is having SHOB.  Denies any body aches, chills, fever.   She feels that this started with the weather changing back and forth.  She stated that her cough is worse in the mornings.  MW please advise. Thanks  Allergies  Allergen Reactions   Codeine Hives, Itching and Other (See Comments)    All over the body, including burning sensation.   Ciprofloxacin Palpitations    Current Outpatient Medications on File Prior to Visit  Medication Sig Dispense Refill   acyclovir (ZOVIRAX) 200 MG capsule Take 200 mg by mouth 2 (two) times daily as needed (outbreaks).      albuterol (VENTOLIN HFA) 108 (90 Base) MCG/ACT inhaler Inhale 2 puffs into the lungs every 6 (six) hours as needed for wheezing or shortness of breath. 18 g 5   amLODipine (NORVASC) 5 MG tablet Take 1 tablet (5 mg total) by mouth daily. 30 tablet 3   gabapentin (NEURONTIN) 100 MG capsule Take 2 pills 4 x daily     hydrochlorothiazide (MICROZIDE) 12.5 MG capsule Take 1 capsule (12.5 mg total) by mouth daily. 30 capsule 3   pantoprazole (PROTONIX) 40 MG tablet Take 1 tablet (40 mg total) by mouth 2 (two) times daily before a meal. 60 tablet 5   predniSONE (DELTASONE) 10 MG tablet Take 4 for three days 3 for three days 2 for three days 1 for three days and stop 30 tablet 0   valsartan (DIOVAN) 40 MG tablet Take 1 tablet by mouth daily.     No current facility-administered medications on file prior to visit.

## 2020-01-24 NOTE — Telephone Encounter (Signed)
Prednisone 10 mg take  4 each am x 2 days,   2 each am x 2 days,  1 each am x 2 days and stop   Ov in 2 weeks with all meds to be sure cleared back to baseline

## 2020-02-07 ENCOUNTER — Encounter: Payer: Self-pay | Admitting: Pulmonary Disease

## 2020-02-07 ENCOUNTER — Other Ambulatory Visit: Payer: Self-pay

## 2020-02-07 ENCOUNTER — Ambulatory Visit: Payer: BC Managed Care – PPO | Admitting: Pulmonary Disease

## 2020-02-07 ENCOUNTER — Ambulatory Visit (INDEPENDENT_AMBULATORY_CARE_PROVIDER_SITE_OTHER): Payer: BC Managed Care – PPO

## 2020-02-07 VITALS — BP 118/76 | HR 89 | Temp 98.2°F | Ht 59.0 in | Wt 171.2 lb

## 2020-02-07 DIAGNOSIS — J310 Chronic rhinitis: Secondary | ICD-10-CM | POA: Diagnosis not present

## 2020-02-07 DIAGNOSIS — Z0000 Encounter for general adult medical examination without abnormal findings: Secondary | ICD-10-CM | POA: Insufficient documentation

## 2020-02-07 DIAGNOSIS — J45991 Cough variant asthma: Secondary | ICD-10-CM

## 2020-02-07 DIAGNOSIS — J45909 Unspecified asthma, uncomplicated: Secondary | ICD-10-CM | POA: Diagnosis not present

## 2020-02-07 MED ORDER — PREDNISONE 10 MG PO TABS: ORAL_TABLET | ORAL | 0 refills | Status: DC

## 2020-02-07 NOTE — Assessment & Plan Note (Signed)
Up-to-date with vaccinations °

## 2020-02-07 NOTE — Assessment & Plan Note (Signed)
Plan: Short additional course of prednisone today Start daily antihistamine Can also use chlor tabs at night for postnasal drip Follow-up with Dr. [name] in 6 to 8 weeks

## 2020-02-07 NOTE — Assessment & Plan Note (Signed)
Plan: Chest x-ray today Short additional course of prednisone today Continue Dulera 100 Start daily antihistamine Can also use chlor tabs at night for postnasal drip Continue Protonix Continue gabapentin Follow-up with Dr. [name] in 6 to 8 weeks

## 2020-02-07 NOTE — Progress Notes (Signed)
@Patient  ID: Sue Mitchell, female    DOB: 07/07/1955, 64 y.o.   MRN: [id number]  Chief Complaint  Patient presents with  . Follow-up  . Asthma    Referring provider: Renaye RakersBland, Veita, MD  HPI:  [id number] year old female former smoker followed in our office for cough variant asthma  PMH: Cough, history hemoptysis, history of sinusitis, chronic rhinitis, vocal cord dysfunction Smoker/ Smoking History: Former smoker. Quit 2011. 12.5 pack year smoker.  Maintenance: Dulera 100 Pt of: Dr. Sherene SiresWert   02/07/2020  - Visit   64 year old female former smoker followed in our office by Dr. Sherene SiresWert.  Patient completing a 2-week follow-up at the request of Dr. Sherene SiresWert.  Patient contacted our office on 01/24/2020 reporting symptoms of a suspected asthma flare.  Patient was treated with prednisone telephonically and requested to have a follow-up in 2 weeks prompting this visit.  Patient reports that cough initially improved when on prednisone but now that she is tapered off the prednisone she feels it is coming back.  She is having cough with clear congestion.  She is also having nasal congestion.  She is not using a daily antihistamine.  Patient with known elevated IgE.  With noncompliance to Singulair in the past.  She [facility].  She is up-to-date with COVID-19 vaccinations and the flu vaccine.   Tests:   02/15/2019-CT chest without contrast-no abnormality to correspond to question findings on recent radiographs or acute findings, no pulmonary nodule, mild hepatic stenosis, aortic arthrosclerosis, mild centrilobular emphysema  05/04/2018-spirometry-FVC 1.8 (90% predicted), ratio 65, FEV1 1.2 (75% predicted)  12/04/2017-pulmonary function test-FVC 1.67 (83% predicted), postbronchodilator ratio 67, postbronchodilator FEV1 1.34 (85% predicted), positive bronchodilator response, DLCO 11.96 (71% predicted)  Allergy profile 12/04/2017 >  Eos 0.0 /  IgE  546 dust only   FENO:  Lab Results   Component Value Date   NITRICOXIDE 7 10/21/2017    PFT: PFT Results Latest Ref Rng & Units 12/04/2017 02/06/2017  FVC-Pre L 1.67 1.87  FVC-Predicted Pre % 83 85  FVC-Post L 1.99 2.04  FVC-Predicted Post % 98 92  Pre FEV1/FVC % % 67 70  Post FEV1/FCV % % 67 67  FEV1-Pre L 1.11 1.30  FEV1-Predicted Pre % 70 76  FEV1-Post L 1.34 1.37  DLCO uncorrected ml/min/mmHg 11.96 12.52  DLCO UNC% % 71 66  DLCO corrected ml/min/mmHg - 12.52  DLCO COR %Predicted % - 66  DLVA Predicted % 80 79  TLC L 4.63 4.53  TLC % Predicted % 109 101  RV % Predicted % 147 119    WALK:  No flowsheet data found.  Imaging: No results found.  Lab Results:  CBC    Component Value Date/Time   WBC 11.5 (H) 04/14/2018 2119   RBC 4.12 04/14/2018 2119   HGB 12.3 04/14/2018 2119   HCT 39.0 04/14/2018 2119   PLT 242 04/14/2018 2119   MCV 94.7 04/14/2018 2119   MCH 29.9 04/14/2018 2119   MCHC 31.5 04/14/2018 2119   RDW 13.8 04/14/2018 2119   LYMPHSABS 2.8 04/14/2018 2119   MONOABS 1.1 (H) 04/14/2018 2119   EOSABS 0.0 04/14/2018 2119   BASOSABS 0.0 04/14/2018 2119    BMET    Component Value Date/Time   NA 138 04/14/2018 2119   K 3.6 04/14/2018 2119   CL 97 (L) 04/14/2018 2119   CO2 30 04/14/2018 2119   GLUCOSE 181 (H) 04/14/2018 2119   BUN 14 04/14/2018 2119   CREATININE 0.87 04/14/2018 2119  CALCIUM 9.2 04/14/2018 2119   GFRNONAA >60 04/14/2018 2119   GFRAA >60 04/14/2018 2119    BNP No results found for: BNP  ProBNP No results found for: PROBNP  Specialty Problems      Pulmonary Problems   Cough variant asthma  vs uacs with vcd    Quit smoking 2011  Recurrent cough since teenager really bad and daily since Aug 2018 12/26/2016  rec symbicort 80 2bid then return for full pfts - PFT's  02/06/2017  FEV1 1.37 (80 % ) ratio 67  p 5 % improvement from saba p symb 80  prior to study with DLCO  66/66c % corrects to 79  % for alv volume   - Spirometry 10/21/2017  FEV1 1.18 (70%)  Ratio 71  with min curvature during attack of cough/ mostly pseudowheeze on exam on symb 80 x2  - FENO 10/21/2017  =   7  On symb 80 x 2  - 11/04/2017     90% with spacer  - singulair added 11/04/2017  But not taking consistently as of 12/04/2017 > rec restart daily  X one week and stopped it due "dizzy"  - PFT's  12/04/2017  FEV1 1.34 (85 % ) ratio 67  p 20 % improvement from saba p nothing prior to study with DLCO  71 % corrects to 80  % for alv volume   - Allergy profile 12/04/2017 >  Eos 0.0 /  IgE  546 dust only  - cyclical cough rx 03/11/2018  - Spirometry 03/26/2018  FEV1 1.1 (73%)  Ratio 0.64 - 03/26/2018  After extensive coaching inhaler device,  effectiveness =    90% s spacer  - Gabapentin 100 mg tid trial 03/26/2018> attempted rx by non-adherent  - Sinus CT 04/07/2018 > Negative study.  No evidence of paranasal sinusitis. - Spirometry 04/08/2018  FEV1 0.7 (47%)  Ratio 0.52 with convex curvature p am symb 80 x 2 and neb in office   - 04/08/2018  After extensive coaching inhaler device,  effectiveness =    75% with smi > added trial of spiriva 2pffs daily x 2 weeks then return  - CTa 04/11/18 for hemoptysis with refractory cough > Limited study due to respiratory motion. No obvious pulmonary embolism or other acute findings. - 04/20/2018  After extensive coaching inhaler device,  effectiveness =    0% with spiriva smi which caused immediate severe cough so rec d/c spiriva  - 04/20/2018 rechallenge with gabapentin 100 mg bid  - 05/04/2018  After extensive coaching inhaler device,  effectiveness =    90% with spacer - s spacer starts coughing immediately  - Spirometry 05/04/2018  FEV1 1.2 (75%)  Ratio 0.65 with mild curvature p symb 80 x 2 puffs   - 05/04/2018 increased gabapentin to 100 tid   - 05/25/2018 increased gabapentin to  100 mg qid > improved  - 07/23/2018 try ppi just in am  07/23/2018  After extensive coaching inhaler device,  effectiveness =    90% with spacer from baseline 75%  - 09/01/2018  changed to  dulera 100 due to cost   - 1/26/2021changed to dulera 200 2bid    Dec 13 2019 flare p changes doses of dulera/ protonix/ ? Gabapentin and stopped prednisone June 8 as back was injected and no longer needing  Of the three most common causes of  Sub-acute / recurrent or chronic cough, only one (GERD)  can actually contribute to/ trigger  the other two (asthma and  post nasal drip syndrome)  and perpetuate the cylce of cough.  While not intuitively obvious, many patients with chronic low grade reflux do not cough until there is a primary insult that disturbs the protective epithelial barrier and exposes sensitive nerve endings.   This is typically viral but can due to PNDS and  either may apply here.     >>> The point is that once this occurs, it is difficult to eliminate the cycle  using anything but a maximally effective acid suppression regimen at least in the short run, accompanied by an appropriate diet to address non acid GERD and control / eliminate the cough itself with gabapentin titrate up to 200 qid.   Also added 12 days of Prednisone and increase dulera to 200 bid  in case of component of Th-2 driven upper or lower airways inflammation (if cough responds short term only to relapse befor return while will on rx for uacs that would point to allergic rhinitis/ asthma or eos bronchitis)    Each maintenance medication was reviewed in detail including most importantly the difference between maintenance and as needed and under what circumstances the prns are to be used.  Please see AVS for specific  Instructions which are unique to this visit and I personally typed out  which were reviewed in detail over the phone with the patient and a copy provided via MyChart              Rhinitis, chronic   Acute maxillary sinusitis   Cough   Hemoptysis   Influenza   Acute respiratory failure with hypoxia (HCC)   Pulmonary infiltrate    Newly noted 01/13/2019 with nl CTa 04/11/2018 / quit smoking 2011   - Re CT s contrast 02/15/2019 >>> wnl           Allergies  Allergen Reactions  . Codeine Hives, Itching and Other (See Comments)    All over the body, including burning sensation.  . Ciprofloxacin Palpitations    Immunization History  Administered Date(s) Administered  . Influenza Split 11/25/2016  . Influenza,inj,Quad PF,6+ Mos 11/04/2017, 10/21/2018  . Moderna Sars-Covid-2 Vaccination 02/24/2019, 03/23/2019    Past Medical History:  Diagnosis Date  . Abdominal distension   . Abdominal pain   . Arthritis   . Asthma   . Biliary colic   . COPD (chronic obstructive pulmonary disease) (HCC)   . Diarrhea   . GERD (gastroesophageal reflux disease)   . Hypertension   . Nausea     Tobacco History: Social History   Tobacco Use  Smoking Status Former Smoker  . Packs/day: 0.50  . Years: 25.00  . Pack years: 12.50  . Types: Cigarettes  . Quit date: 2011  . Years since quitting: 10.9  Smokeless Tobacco Never Used   Counseling given: Not Answered   Continue to not smoke  Outpatient Encounter Medications as of 02/07/2020  Medication Sig  . acyclovir (ZOVIRAX) 200 MG capsule Take 200 mg by mouth 2 (two) times daily as needed (outbreaks).   Marland Kitchen albuterol (VENTOLIN HFA) 108 (90 Base) MCG/ACT inhaler Inhale 2 puffs into the lungs every 6 (six) hours as needed for wheezing or shortness of breath.  Marland Kitchen amLODipine (NORVASC) 5 MG tablet Take 1 tablet (5 mg total) by mouth daily.  . DULERA 100-5 MCG/ACT AERO Inhale 2 puffs into the lungs 2 (two) times daily.  Marland Kitchen gabapentin (NEURONTIN) 100 MG capsule Take 2 pills 4 x daily  . hydrochlorothiazide (MICROZIDE) 12.5 MG capsule  Take 1 capsule (12.5 mg total) by mouth daily.  . pantoprazole (PROTONIX) 40 MG tablet Take 1 tablet (40 mg total) by mouth 2 (two) times daily before a meal.  . valsartan (DIOVAN) 40 MG tablet Take 1 tablet by mouth daily.  . predniSONE (DELTASONE) 10 MG tablet Take 2 tablets (20mg  total) daily for the next 5  days. Take in the AM with food.  . [DISCONTINUED] predniSONE (DELTASONE) 10 MG tablet Take 4 for two days  2 for two days 1 for two days and stop (Patient not taking: Reported on 02/07/2020)   No facility-administered encounter medications on file as of 02/07/2020.     Review of Systems  Review of Systems  Constitutional: Negative for activity change, fatigue and fever.  HENT: Positive for congestion, postnasal drip and rhinorrhea. Negative for sinus pressure, sinus pain and sore throat.   Respiratory: Positive for cough. Negative for shortness of breath and wheezing.   Cardiovascular: Negative for chest pain and palpitations.  Gastrointestinal: Negative for diarrhea, nausea and vomiting.  Musculoskeletal: Negative for arthralgias.  Neurological: Negative for dizziness.  Psychiatric/Behavioral: Negative for sleep disturbance. The patient is not nervous/anxious.      Physical Exam  BP 118/76 (BP Location: Left Arm, Cuff Size: Normal)   Pulse 89   Temp 98.2 F (36.8 C) (Oral)   Ht 4\' 11"  (1.499 m)   Wt 171 lb 3.2 oz (77.7 kg)   SpO2 97%   BMI 34.58 kg/m   Wt Readings from Last 5 Encounters:  02/07/20 171 lb 3.2 oz (77.7 kg)  07/19/19 173 lb (78.5 kg)  05/27/19 171 lb (77.6 kg)  04/01/19 171 lb (77.6 kg)  02/09/19 175 lb 12.8 oz (79.7 kg)    BMI Readings from Last 5 Encounters:  02/07/20 34.58 kg/m  07/19/19 34.94 kg/m  05/27/19 34.54 kg/m  04/01/19 34.54 kg/m  02/09/19 36.12 kg/m     Physical Exam Vitals and nursing note reviewed.  Constitutional:      General: She is not in acute distress.    Appearance: Normal appearance. She is obese.  HENT:     Head: Normocephalic and atraumatic.     Right Ear: External ear normal.     Left Ear: External ear normal.     Nose: Rhinorrhea present. No congestion.     Mouth/Throat:     Mouth: Mucous membranes are moist.     Pharynx: Oropharynx is clear.     Comments: Postnasal drip Eyes:     Pupils: Pupils are equal,  round, and reactive to light.  Cardiovascular:     Rate and Rhythm: Normal rate and regular rhythm.     Pulses: Normal pulses.     Heart sounds: Normal heart sounds. No murmur heard.   Pulmonary:     Effort: Pulmonary effort is normal. No respiratory distress.     Breath sounds: No decreased air movement. Wheezing (Slight expiratory wheeze) present. No decreased breath sounds or rales.  Musculoskeletal:     Cervical back: Normal range of motion.  Skin:    General: Skin is warm and dry.     Capillary Refill: Capillary refill takes less than 2 seconds.  Neurological:     General: No focal deficit present.     Mental Status: She is alert and oriented to person, place, and time. Mental status is at baseline.     Gait: Gait normal.  Psychiatric:        Mood and Affect: Mood normal.  Behavior: Behavior normal.        Thought Content: Thought content normal.        Judgment: Judgment normal.       Assessment & Plan:   Cough variant asthma  vs uacs with vcd Plan: Chest x-ray today Short additional course of prednisone today Continue Dulera 100 Start daily antihistamine Can also use chlor tabs at night for postnasal drip Continue Protonix Continue gabapentin Follow-up with Dr. [name] in 6 to 8 weeks   Rhinitis, chronic Plan: Short additional course of prednisone today Start daily antihistamine Can also use chlor tabs at night for postnasal drip Follow-up with Dr. [name] in 6 to 8 weeks  Healthcare maintenance Up-to-date with vaccinations    Return in about 2 months (around 04/09/2020), or if symptoms worsen or fail to improve, for Follow up with Dr. [name].   Coral Ceo, NP 02/07/2020   This appointment required 23 minutes of patient care (this includes precharting, chart review, review of results, face-to-face care, etc.).

## 2020-02-07 NOTE — Patient Instructions (Addendum)
You were seen today by Coral Ceo, NP  for:    1. Cough variant asthma  - predniSONE (DELTASONE) 10 MG tablet; Take 2 tablets (20mg  total) daily for the next 5 days. Take in the AM with food.  Dispense: 10 tablet; Refill: 0 - DG Chest 2 View; Future  Chest x-ray today  Dulera 100 >>> 2 puffs in the morning right when you wake up, rinse out your mouth after use, 12 hours later 2 puffs, rinse after use >>> Take this daily, no matter what >>> This is not a rescue inhaler   Only use your albuterol as a rescue medication to be used if you can't catch your breath by resting or doing a relaxed purse lip breathing pattern.  - The less you use it, the better it will work when you need it. - Ok to use up to 2 puffs  every 4 hours if you must but call for immediate appointment if use goes up over your usual need - Don't leave home without it !!  (think of it like the spare tire for your car)   Continue gabapentin  Continue Protonix   2. Rhinitis, chronic  Please start taking a daily antihistamine:  >>>choose one of: zyrtec, claritin, allegra, or xyzal  >>>these are over the counter medications  >>>can choose generic option  >>>take daily  >>>this medication helps with allergies, post nasal drip, and cough   You can start taking chlorpheniramine (aka Chlor tabs) 4 mg tablet (1 to 2 tablets at night) for management of allergies and postnasal drip at night >>> This is an over-the-counter medication >>> This medication is sedating   3. Healthcare maintenance  Great job being up-to-date with your vaccinations   We recommend today:  Orders Placed This Encounter  Procedures   DG Chest 2 View    Standing Status:   Future    Number of Occurrences:   1    Standing Expiration Date:   06/07/2020    Order Specific Question:   Reason for Exam (SYMPTOM  OR DIAGNOSIS REQUIRED)    Answer:   cough    Order Specific Question:   Preferred imaging location?    Answer:   Internal    Order  Specific Question:   Radiology Contrast Protocol - do NOT remove file path    Answer:   [email or web address]   Orders Placed This Encounter  Procedures   DG Chest 2 View   Meds ordered this encounter  Medications   predniSONE (DELTASONE) 10 MG tablet    Sig: Take 2 tablets (20mg  total) daily for the next 5 days. Take in the AM with food.    Dispense:  10 tablet    Refill:  0    Follow Up:    Return in about 2 months (around 04/09/2020), or if symptoms worsen or fail to improve, for Follow up with Dr. .   Notification of test results are managed in the following manner: If there are  any recommendations or changes to the  plan of care discussed in office today,  we will contact you and let you know what they are. If you do not hear from 04/11/2020, then your results are normal and you can view them through your  MyChart account , or a letter will be sent to you. Thank you again for trusting [name] with your care  - Thank you, [facility]    It is flu season:   >>> Best ways to  protect herself from the flu: Receive the yearly flu vaccine, practice good hand hygiene washing with soap and also using hand sanitizer when available, eat a nutritious meals, get adequate rest, hydrate appropriately       Please contact the office if your symptoms worsen or you have concerns that you are not improving.   Thank you for choosing [facility] for your healthcare, and for allowing [place] to partner with you on your healthcare journey. I am thankful to be able to provide care to you today.   Elisha Headland FNP-C

## 2020-02-10 DIAGNOSIS — R7302 Impaired glucose tolerance (oral): Secondary | ICD-10-CM | POA: Diagnosis not present

## 2020-02-10 DIAGNOSIS — I10 Essential (primary) hypertension: Secondary | ICD-10-CM | POA: Diagnosis not present

## 2020-02-10 DIAGNOSIS — F064 Anxiety disorder due to known physiological condition: Secondary | ICD-10-CM | POA: Diagnosis not present

## 2020-02-10 DIAGNOSIS — G4730 Sleep apnea, unspecified: Secondary | ICD-10-CM | POA: Diagnosis not present

## 2020-02-10 DIAGNOSIS — E669 Obesity, unspecified: Secondary | ICD-10-CM | POA: Diagnosis not present

## 2020-02-17 ENCOUNTER — Encounter (HOSPITAL_COMMUNITY): Payer: Self-pay | Admitting: Emergency Medicine

## 2020-02-17 ENCOUNTER — Other Ambulatory Visit: Payer: Self-pay

## 2020-02-17 ENCOUNTER — Ambulatory Visit (HOSPITAL_COMMUNITY)
Admission: EM | Admit: 2020-02-17 | Discharge: 2020-02-17 | Disposition: A | Discharge status: Home / Self Care | Payer: BC Managed Care – PPO | Attending: Emergency Medicine | Admitting: Emergency Medicine

## 2020-02-17 DIAGNOSIS — Z79899 Other long term (current) drug therapy: Secondary | ICD-10-CM | POA: Insufficient documentation

## 2020-02-17 DIAGNOSIS — R059 Cough, unspecified: Secondary | ICD-10-CM | POA: Diagnosis not present

## 2020-02-17 DIAGNOSIS — Z7951 Long term (current) use of inhaled steroids: Secondary | ICD-10-CM | POA: Insufficient documentation

## 2020-02-17 DIAGNOSIS — J449 Chronic obstructive pulmonary disease, unspecified: Secondary | ICD-10-CM | POA: Insufficient documentation

## 2020-02-17 DIAGNOSIS — R062 Wheezing: Secondary | ICD-10-CM | POA: Diagnosis not present

## 2020-02-17 DIAGNOSIS — Z87891 Personal history of nicotine dependence: Secondary | ICD-10-CM | POA: Insufficient documentation

## 2020-02-17 DIAGNOSIS — Z20822 Contact with and (suspected) exposure to covid-19: Secondary | ICD-10-CM | POA: Diagnosis not present

## 2020-02-17 LAB — RESP PANEL BY RT-PCR (FLU A&B, COVID) ARPGX2
Influenza A by PCR: NEGATIVE
Influenza B by PCR: NEGATIVE
SARS Coronavirus 2 by RT PCR: NEGATIVE

## 2020-02-17 MED ORDER — PREDNISONE 50 MG PO TABS: ORAL_TABLET | ORAL | 0 refills | Status: DC

## 2020-02-17 NOTE — Discharge Instructions (Addendum)
Take prednisone once daily for the next five days.  Continue taking nebulized breathing treatments with albuterol and Atrovent every 4 hours as needed for wheezing. If symptoms do not improve in 2 days, please seek care at emergency department.

## 2020-02-17 NOTE — ED Triage Notes (Signed)
Pt c/o cough and SOB x 2 days. +congestion

## 2020-02-17 NOTE — ED Provider Notes (Signed)
[facility]  ____________________________________________  Time seen: Approximately 1:07 PM  I have reviewed the triage vital signs and the nursing notes.   HISTORY  Chief Complaint Cough and Shortness of Breath   Historian Patient     HPI Sue Mitchell is a 64 y.o. female presents to the urgent care with shortness of breath and wheezing for 2 days.  Patient has a history of asthma.  She reports that her current symptoms feel similar to asthma exacerbations in the past.  She has been using nebulized breathing treatments with Atrovent and albuterol at home and feels like she needs a steroid.  She denies fever, chills, rhinorrhea, nasal congestion, persistent diarrhea or vomiting.  She would like to be tested for COVID-19.  No other alleviating measures have been attempted.   Past Medical History:  Diagnosis Date  . Abdominal distension   . Abdominal pain   . Arthritis   . Asthma   . Biliary colic   . COPD (chronic obstructive pulmonary disease) (HCC)   . Diarrhea   . GERD (gastroesophageal reflux disease)   . Hypertension   . Nausea      Immunizations up to date:  Yes.     Past Medical History:  Diagnosis Date  . Abdominal distension   . Abdominal pain   . Arthritis   . Asthma   . Biliary colic   . COPD (chronic obstructive pulmonary disease) (HCC)   . Diarrhea   . GERD (gastroesophageal reflux disease)   . Hypertension   . Nausea     Patient Active Problem List   Diagnosis Date Noted  . Healthcare maintenance 02/07/2020  . Shoulder pain, left 01/14/2019  . Pulmonary infiltrate 01/14/2019  . Acute respiratory failure with hypoxia (HCC) 04/14/2018  . Hemoptysis 04/11/2018  . Influenza 04/11/2018  . Cough 04/11/2018  . Acute maxillary sinusitis 02/20/2018  . Rhinitis, chronic 12/04/2017  . Cough variant asthma  vs uacs with vcd 12/26/2016    Past Surgical History:  Procedure Laterality Date  . cartilage removal  2007   left knee  .  CESAREAN SECTION  1976  . LAPAROSCOPY  1992 (approx)    abdominal    Prior to Admission medications   Medication Sig Start Date End Date Taking? Authorizing Provider  acyclovir (ZOVIRAX) 200 MG capsule Take 200 mg by mouth 2 (two) times daily as needed (outbreaks).  01/22/11  Yes [provider]  albuterol (VENTOLIN HFA) 108 (90 Base) MCG/ACT inhaler Inhale 2 puffs into the lungs every 6 (six) hours as needed for wheezing or shortness of breath. 02/22/19  Yes Nyoka Cowden, MD  amLODipine (NORVASC) 5 MG tablet Take 1 tablet (5 mg total) by mouth daily. 04/15/18  Yes Rai, Ripudeep K, MD  DULERA 100-5 MCG/ACT AERO Inhale 2 puffs into the lungs 2 (two) times daily. 12/13/19   [provider]  gabapentin (NEURONTIN) 100 MG capsule Take 2 pills 4 x daily 08/24/19   Nyoka Cowden, MD  hydrochlorothiazide (MICROZIDE) 12.5 MG capsule Take 1 capsule (12.5 mg total) by mouth daily. 04/15/18   Rai, Delene Ruffini, MD  pantoprazole (PROTONIX) 40 MG tablet Take 1 tablet (40 mg total) by mouth 2 (two) times daily before a meal. 03/01/19   Nyoka Cowden, MD  predniSONE (DELTASONE) 50 MG tablet Take one tablet once daily for the next five days. 02/17/20   Orvil Feil, PA-C  valsartan (DIOVAN) 40 MG tablet Take 1 tablet by mouth daily. 08/19/18  [provider]    Allergies Codeine and Ciprofloxacin  Family History  Problem Relation Age of Onset  . Diabetes Father     Social History Social History   Tobacco Use  . Smoking status: Former Smoker    Packs/day: 0.50    Years: 25.00    Pack years: 12.50    Types: Cigarettes    Quit date: 2011    Years since quitting: 10.9  . Smokeless tobacco: Never Used  Vaping Use  . Vaping Use: Never used  Substance Use Topics  . Alcohol use: Yes    Comment: occasional 2 per month  . Drug use: No     Review of Systems  Constitutional: No fever/chills Eyes:  No discharge ENT: No upper respiratory complaints. Respiratory:  Patient has cough and wheezing Gastrointestinal:   No nausea, no vomiting.  No diarrhea.  No constipation. Musculoskeletal: Negative for musculoskeletal pain. Skin: Negative for rash, abrasions, lacerations, ecchymosis.    ____________________________________________   PHYSICAL EXAM:  VITAL SIGNS: ED Triage Vitals  Enc Vitals Group     BP 02/17/20 1100 109/85     Pulse Rate 02/17/20 1100 86     Resp 02/17/20 1100 (!) 22     Temp 02/17/20 1100 97.7 F (36.5 C)     Temp Source 02/17/20 1100 Oral     SpO2 02/17/20 1100 99 %     Weight --      Height --      Head Circumference --      Peak Flow --      Pain Score 02/17/20 1101 0     Pain Loc --      Pain Edu? --      Excl. in GC? --      Constitutional: Alert and oriented. Well appearing and in no acute distress. Eyes: Conjunctivae are normal. PERRL. EOMI. Head: Atraumatic. ENT:      Ears: TMs are pearly.       Nose: No congestion/rhinnorhea.      Mouth/Throat: Mucous membranes are moist.  Neck: No stridor.  No cervical spine tenderness to palpation. Cardiovascular: Normal rate, regular rhythm. Normal S1 and S2.  Good peripheral circulation. Respiratory: No increased work of breathing.  Patient is tachypneic.  Patient has mild expiratory wheeze auscultated bilaterally.  Good air entry to the bases with no decreased or absent breath sounds Gastrointestinal: Bowel sounds x 4 quadrants. Soft and nontender to palpation. No guarding or rigidity. No distention. Musculoskeletal: Full range of motion to all extremities. No obvious deformities noted Neurologic:  Normal for age. No gross focal neurologic deficits are appreciated.  Skin:  Skin is warm, dry and intact. No rash noted. Psychiatric: Mood and affect are normal for age. Speech and behavior are normal.   ____________________________________________   LABS (all labs ordered are listed, but only abnormal results are displayed)  Labs Reviewed - No data to  display ____________________________________________  EKG   ____________________________________________  RADIOLOGY   No results found.  ____________________________________________    PROCEDURES  Procedure(s) performed:     Procedures     Medications - No data to display   ____________________________________________   INITIAL IMPRESSION / ASSESSMENT AND PLAN / ED COURSE  Pertinent labs & imaging results that were available during my care of the patient were reviewed by me and considered in my medical decision making (see chart for details).      Assessment and plan Cough Wheezing 64 year old female presents to the urgent care with  nonproductive cough and wheezing for the past 2 days.  Vital signs are reassuring at triage.  On physical exam, patient was alert, active and nontoxic-appearing.  She was mildly tachypneic and had expiratory wheezes auscultated bilaterally.  Recommended prednisone once daily for the next 5 days and continual use of albuterol and Atrovent breathing treatments at home over the next 3 days.  Recommended seeking care at local emergency department if symptoms worsen or do not improve.  Patient opted to be tested for COVID-19 and influenza prior to discharge.  Results are in process at this time.  All patient questions were answered.    ____________________________________________  FINAL CLINICAL IMPRESSION(S) / ED DIAGNOSES  Final diagnoses:  Cough  Wheezing      NEW MEDICATIONS STARTED DURING THIS VISIT:  ED Discharge Orders         Ordered    predniSONE (DELTASONE) 50 MG tablet        02/17/20 1140              This chart was dictated using voice recognition software/Dragon. Despite best efforts to proofread, errors can occur which can change the meaning. Any change was purely unintentional.     Orvil Feil, PA-C 02/17/20 1313

## 2020-02-28 ENCOUNTER — Telehealth: Payer: Self-pay | Admitting: Internal Medicine

## 2020-02-28 NOTE — Telephone Encounter (Signed)
LMTCB

## 2020-02-29 NOTE — Telephone Encounter (Signed)
Spoke with pt, requesting to be written out of work X3 weeks.  I asked why- pt states that she is very stressed and when she gets stressed her asthma flares up.  Pt stated "If I could just take a break I would be ok".  I asked pt if she sees anyone to help with stress mgmt but pt declined.  I asked if this feels more stress related- she stated that "the stress causes the asthma"..  MW please advise if you are willing to write patient out of work for three weeks.  Thanks.

## 2020-02-29 NOTE — Telephone Encounter (Signed)
Spoke with pt, aware of recs.  Pt seeing MW tomorrow morning.  Nothing further needed at this time- will close encounter.

## 2020-02-29 NOTE — Telephone Encounter (Signed)
Ok to write letter but needs to see me sometime in the 3 week window to go over all aspects of her asthma care if she's having to miss work due to asthma regardless of what the perceived triggers are,  stress or otherwise.

## 2020-03-01 ENCOUNTER — Encounter: Payer: Self-pay | Admitting: Internal Medicine

## 2020-03-01 ENCOUNTER — Encounter: Payer: Self-pay | Admitting: *Deleted

## 2020-03-01 ENCOUNTER — Ambulatory Visit (INDEPENDENT_AMBULATORY_CARE_PROVIDER_SITE_OTHER): Payer: BC Managed Care – PPO | Admitting: Internal Medicine

## 2020-03-01 ENCOUNTER — Other Ambulatory Visit: Payer: Self-pay

## 2020-03-01 DIAGNOSIS — J45991 Cough variant asthma: Secondary | ICD-10-CM | POA: Diagnosis not present

## 2020-03-01 MED ORDER — RABEPRAZOLE SODIUM 20 MG PO TBEC: DELAYED_RELEASE_TABLET | ORAL | 2 refills | Status: DC

## 2020-03-01 NOTE — Progress Notes (Signed)
Subjective:    Patient ID: Sue Mitchell, female   DOB: [birth year]     MRN: [id number]   Brief patient profile:  64 yobf  RT quit smoking 2011 @ wt 140  with h/o sinus symptoms assoc with cough and need for saba prn as teenager while living in [place] but after arrival here at age late 38's it changed to point where bothered her mostly with onset of cold weather typically req ov rx  abx and prednisone help a lot and this pattern continued even after quit smoking to where the episodes last longer and onset of this not directed linked to cold weather in fact  had one April 2018 never  Completely cleared  then flared again in Oct 01 2016 severe dry cough / eval by allergist /ent since onset neg w/u so referred by Dr Sue Mitchell to pulmonary clinic 12/26/16     History of Present Illness  12/26/2016 1st [name]   Chief Complaint  Patient presents with  . Pulm Consult    Pt referred by Dr. Billy Mitchell ENT. Pt has productive cough-clear mucus sometimes yellow. Pt had horseness in voice for 6-8 weeks, no chills or fever, have some chest pain and tightness with cough.  recurrent cough since April 2018 never resolved p rx as uri then flaired early August 2018 rx zpak/pred/saba> some better  Cough was worse at hs now   Am feels wheezy not really coughing much up but what she does is white and < 1 tbsp Cough seems worse with exp to cold air at work  Assoc overt hb better on zantac  Some gen ant chest discomfort with severe coughing fits  rec Plan A = Automatic =  symbicort 80 Take 2 puffs first thing in am and then another 2 puffs about 12 hours later.  Plan B = Backup Only use your albuterol as a rescue medication  Start zantac 150- 300 mg after bfast and after supper until cough better  GERD diet    02/06/2017  f/u ov/Sue Mitchell re:  GOLD I copd/ cough on cold air exp  Chief Complaint  Patient presents with  . Follow-up    Cough had improved but then worsened again  with colder weather. She is using her albuterol inhaler 2 x per wk on average.   not limited by doe / steps ok except for knees  Some gerd at hs taking h2 am only  rec Pantoprazole (protonix) 40 mg  Take  30-60 min before first meal of the day and Zantac  300 mg bedtime until return to office - this is the best way to tell whether stomach acid is contributing to your problem.   GERD diet  Please schedule a follow up office visit in 6 weeks, call sooner if needed     10/21/2017  f/u ov/Sue Mitchell re: acute refractory cough / saw allergist cannot name "all neg"  ? Sue Mitchell?  Chief Complaint  Patient presents with  . Follow-up    Last seen by [facility] 02/06/17. Patient states she has had a productive cough, post nasal drip, and constantly using her voice. At night, she becomes so congested that she has to breathe through her mouth instead of nose.    from last ov until 09/25/17 fine on on ranitidine one bid/symb 80 2bid Never took protonix  Was still needing saba twice a week at baseline and a lot more while working at [employer]  Then abruptly  worse Aug 1  lots sinus drainage clear > zpak early in Aug by Sue Mitchell some better for a week or two then worse says ran out of symbicort one day prior to ov / using lots of saba now and severe nasal congestion as well as hoarseness and can't sleep due to cough rec Plan A = Automatic = symbiocort 80 Take 2 puffs first thing in am and then another 2 puffs about 12 hours later.  Plan B = Backup Only use your albuterol as a rescue medication  Prednisone 10 mg take  4 each am x 2 days,   2 each am x 2 days,  1 each am x 2 days and stop  Pantoprazole (protonix) 40 mg   Take  30-60 min before first meal of the day and ranitidine 300 mg each evening GERD diet      11/04/2017  f/u ov/Sue Mitchell re: atypical asthma in RT  Chief Complaint  Patient presents with  . Follow-up    Pt c/o sneezing, wheezing and cough with clear sputum- started after she mowed her lawn 1 day ago. She  has had to use her albuterol inhaler.   wakes up feeling good most am's s cough/ wheeze about 30 min later takes first 2 pffs of symb 80 around 5 15 am  Then next 2 pffs after 9 pm  Had been doing great prior mowing grass one day prior to OV Rarely need saba since last flare    Not limited by breathing from desired activities   rec Continue symbicort 80 Take 2 puffs first thing in am and then another 2 puffs about 12 hours later thru spacer Add singulair 10 mg each pm    12/04/2017  f/u ov/Sue Mitchell re:   symb 80 2bid/ singualir sometimes  Chief Complaint  Patient presents with  . Follow-up    PFT today, continues to have cough with hoarseness. She uses her albuterol inhaler 1 x per wk on average.   Dyspnea:  fine Cough: p grass exp / chemicals/ cologne dry Sleeping: flat/ one pillow no noct symptms SABA use: rare 02: none   Flare of cough / drainage 12/01/17 > has not tried otc's Plan A = Automatic = symbicort 80 Take 2 puffs first thing in am and then another 2 puffs about 12 hours later and remember to breath it out through your nose  singulair  10 mg every evening  Plan B = Backup Only use your albuterol as a rescue medication  For nasal symptoms > zyrtec 10 mg on at bedtime as needed > too sleepy    02/20/18 NP ov rec Augmentin/ pred/ antihistamine/singulair > no better and could not take singulair / did not try zyrtec in am "makes me sleepy"     03/11/2018 extended  f/u ov/Sue Mitchell re: refractory  Cough since aug 2018    Chief Complaint  Patient presents with  . Follow-up    Cough not improving. She is coughing up clear to white sputum. She states her cough is worse when she goes to work and when she first lies down at night. She is using her albuterol inhaler once daily on average.   Dyspnea:  Not limited by breathing from desired activities   Cough: worse around 5pm  Then immediately when supine x then some during the night, never took zyrtec at hs as rec with sense of globus and  throat / chest "congestion" but no excess mucus  Sleeping: on flat bed on side  SABA use: ?  if neb helps more than hfa - not really  Sure but "it sure makes me shake" Still using mint products  rec Symbicort 80  Take 2 puffs first thing in am and then another 2 puffs about 12 hours later.  Pantoprazole (protonix) 40 mg   Take  30-60 min before first meal of the day and Pepcid (famotidine)  20 mg one @  bedtime until return to office - this is the best way to tell whether stomach acid is contributing to your problem.   GERD diet  Try zyrtec 10 mg after supper  Take delsym two tsp every 12 hours and supplement if needed with  vicodin up to 1every 4 hours to suppress the urge to cough. Swallowing water and/or using ice chips/non mint and menthol containing candies (such as lifesavers or sugarless jolly ranchers) are also effective.  You should rest your voice and avoid activities that you know make you cough. Once you have eliminated the cough for 3 straight days try reducing the vicodin first,  then the delsym as tolerated.   On 03/14/18 Prednisone 10 mg take  4 each am x 2 days,   2 each am x 2 days,  1 each am x 2 days and stop  Please schedule a follow up office visit in 2 weeks, sooner if needed  with all medications /inhalers/ solutions in hand so we can verify exactly what you are taking. This includes all medications from all doctors and over the counters    03/26/2018  f/u ov/Sue Mitchell re: refractory cough/ wheeze on symb 80 2bid  Chief Complaint  Patient presents with  . Follow-up    Pt c/o sinus congestion, cough with yellow sputum and wheezing. She is using her albuterol inhaler once per wk on average.   Dyspnea:  Ok unless cough Cough:  at work and at home same severity/ frequency  Sleeping: not coughing while sleeping  SABA use:  Not really helping 02: none  gen ant chest soreness from coughing/ not able to use vicodin daytime to suppress cough rec  Only use your albuterol as a rescue  medication  Please see patient coordinator before you leave today  to schedule sinus CT Please remember to go to the  x-ray department  for your tests - we will call you with the results when they are available  Please schedule a follow up office visit in 2 weeks, sooner if needed  with all medications /inhalers/ solutions in hand so we can verify exactly what you are taking. This includes all medications from all doctors and over the counters       04/08/2018  f/u ov/Sue Mitchell re: refractory cough / brought just her inhalers to [facility]   Chief Complaint  Patient presents with  . Acute Visit    Pt has had complaints of sinius drainage and a cough x4 days and also states she has had problems sleeping. Pt also has been hurting everywhere, feeling weak, and has had sweats.  Dyspnea: usually only sob when coughing Cough: white mucus / min production "but feels like it's choking her"  Sleeping: on side bed pillows too heavy for bed blocks/ worse cough now at hs / not using cough suppression as advised  SABA use: rarely / symbicort count on 50 so missed about 14 doses or continued to use beyond the red line on the prior/ warned about both 02: none Reported after much back and forth "felt good only while on prednisone" and gradually worse  off it  - turns out "felt good" does not mean the cough was gone but breathing was better. rec Prednisone 10mg   Take 4 for three days 3 for three days 2 for three days 1 for three days and stop Plan A = Automatic = Symbicort 80 Take 2 puffs first thing in am and then another 2 puffs about 12 hours later and spiriva 2 puff each am  Plan B = Backup Only use your albuterol inhaler as a rescue medication   Try gabapentin 100 mg twice daily bfast and supper and if can't take it twice daily just take at bedtime  For drainage / throat tickle try take CHLORPHENIRAMINE  4 mg (chlortabs  [facility])   Stay on protonix Take 30- 60 min before your first and last meals of the day   Take delsym two tsp every 12 hours and supplement if needed with  vicodine  up to 2 every 4 hours to suppress the urge to cough. .  Please schedule a follow up office visit in 2 weeks, sooner if needed  with all medications /inhalers/ solutions in hand so we can verify exactly what you are taking. This includes all medications from all doctors and over the counters - needs spirometry on return     Admit date: 04/14/2018 Discharge date: 04/18/2018   Equipment/Devices: Nebulizer with albuterol Sue'n, 2L oxygen ordered several days prior to discharge. Apparently patient left stating she would get the nebulizer and oxygen from [facility] and did not want to wait for it to be delivered to the room per care management. Per CM note, this will be delivered to the home.   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 At the time of discharge she reportedly maintained sufficient oxygenation during ambulation, but in the ED she was found to be hypoxic, wheezing, coughing, and weak, and was subsequently readmitted. With continued treatment including IV steroids she's subjectively significantly improved. She was weaned from oxygen while at rest and still desaturates on ambulation. She is discharged with DME in stable condition with follow up in 48 hours.  Discharge Diagnoses:  Principal Problem:   Acute respiratory failure with hypoxia (HCC)   Cough variant asthma  vs uacs wit vcd   Influenza   Cough  Acute hypoxic respiratory failure due to influenza A and asthma exacerbation:  - Completed5 days of tamiflu - Continue symbicort, spiriva respimat - Scheduled and prnalbuterol nebulizer (provided at DC) -Wheezing has improved so converted to po steroid with improvement, has prednisone at home, ordered to  take 40mg  po daily until follow up.  - Continue supplemental oxygen to maintain SpO2 >90%. Despite significant improvement, does still need O2 at discharge. Will anticipate continued improvement and liberation from oxygen, will need recheck at follow up office visit with pulmonology the coming week. - Continue antitussive. PMPAware queried, has gotten hydrocodone-APAP from Dr. 3/18 2/12 and cough syrup intermittently from PCP.   Hemoptysis: CTA chest motion-degraded at last admission, no PE.  - Resolved.   Hypertension:  - Continue norvasc and HCTZ since recently stopping ARB for concern of exacerbating cough.   GERD:  - Continue PPI    04/20/2018  Extended f/u ov/Sue Mitchell re: post hosp f/u  Chief Complaint  Patient presents with  . Follow-up    Breathing has improved. She is not using her  albuterol inhaler but is using albuterol neb about 3 x per day.   Dyspnea:  MMRC2 = can't walk a nl pace on a flat Mitchell s sob but does fine slow and flat  Cough: gone/ min urge to clear throat but now on tussionex Sleeping: side / pillows to prop up  SABA use: way over using as above  02: none at all  rec Continue pantoprazole 40 mg Take 30- 60 min before your first and last meals of the day  Gabapentin  Restart 100 mg twice daily bfast and bedtime Plan A = Automatic = Symbicort 80 Take 2 puffs first thing in am and then another 2 puffs about 12 hours later.  Work on inhaler technique:   Plan B = Backup Only use your albuterol inhaler as a rescue medication  Plan C = Crisis - only use your albuterol nebulizer if you first try Plan B and it fails to help > ok to use the nebulizer up to every 4 hours but if start needing it regularly call for immediate appointment Finish the prednisone and stop spiriva F/u in 2 weeks with meds in hand (#35 on present symb 80 and one sample given) Add:  Consider performist/bud neb if can't wean off pred/saba neb and hfa makes her cough worse due to effects on upper  airway    05/04/2018  f/u ov/Sue Mitchell re:   symbicort 80 one full at home and total of 45 puffs samples/ 32 gabapenitn  Chief Complaint  Patient presents with  . Acute Visit    Cough has improved but has not resolved. She is coughing up min, clear sputum. She has not had to use her albuterol inhaler.   Dyspnea:  MMRC1 = can walk nl pace, flat Mitchell, can't hurry or go uphills or steps s sob   Cough: p exertion Sleeping: fine 30 degrees on pillows  SABA use: no saba  02: no rec Increase gabapentin to 100 mg three times a day  ok to return to work 05/16/2018  Please schedule a follow up office visit in 3 weeks, sooner if needed  with all medications /inhalers/ solutions in hand so we can verify exactly what you are taking. This includes all medications from all doctors and over the counters        History of Present Illness: 05/25/2018   televist/Sue Mitchell re: cough/ sob symbicort 80 2bid /still on samples "half full"  Dyspnea:  Walked for 5 minutes some inclines  Cough: some daytime / dry assoc with hoarseness  Sleeping: flat bed/ 3 pillows  SABA use: none   rec For drainage / throat tickle try take CHLORPHENIRAMINE  4 mg (chlortab 4 mg) - take one every 4 hours as needed - available over the counter- may cause drowsiness so start with just a bedtime dose or two and see how you tolerate it before trying in daytime   Increase gabapentin to 100 mg four times daily  Continue Symbicort 80 Take 2 puffs first thing in am and then another 2 puffs about 12 hours later (use coupon and let me know if they won't honor it  I will take you out of work until end of April 2020      06/19/18 Defer work note to Dr Parke Simmers re back For drainage / throat tickle try take CHLORPHENIRAMINE  4 mg  (Chlortab 4mg   at should be easiest to find in the green box)  take one every 4 hours as needed -  available over the counter- may cause drowsiness so start with just a bedtime dose or two and see how you  tolerate it before trying in daytime   Elevate head of bed on 6-8 inch bed block   07/23/2018  f/u ov/Sue Mitchell re: asthma with uacs  On symb 80 2bid , gapentin 100 qid /ppi bid Chief Complaint  Patient presents with  . Follow-up    Breathing is much improved. She is coughing minimally- mainly in the am's. She has not had to use her rescue inhaler in over a month.  Dyspnea:  Walks x 20 mins s stopping, some hills Cough: minimal/ non productive, day >noct  Sleeping: bed is flat  3pillows for back SABA use: none   rec No change in medications Ok to leave off pm dose of pantaprozole if not coughing Ok to return to work without restrictions on August 10 2018    09/01/2018  f/u ov/Sue Mitchell re: asthma  Vs vcd back on ppi bid / not keeping up with symbicort rx due to cost but did not contact us re this issue and just stopped taking  Chief Complaint  Patient presents with  . Follow-up    increased cough for a wk- prod with white sputum. She is also wheezing and has had to use her albuterol 4 x since her last visit.   Dyspnea:  Not limited by breathing from desired activities  / up and down steps  Cough: gradually worse x one week / min sputum mostly in ams Sleeping: at least once per night wakes up needing saba  SABA use: 3-4 x since 02: none  rec Plan A = Automatic = dulera 100 Take 2 puffs first thing in am and then another 2 puffs about 12 hours later.  Plan B = Backup Only use your albuterol (proair)  inhaler as a rescue medication to be used if you can't catch your breath by resting or doing a relaxed purse lip breathing pattern.  - The less you use it, the better it will work when you need it. - Ok to use the inhaler up to 2 puffs  every 4 hours if you must but call for appointment if use goes up over your usual need - Don't leave home without it !!  (think of it like the spare tire for your car)  Prednisone 10 mg take  4 each am x 2 days,   2 each am x 2 days,  1 each am x 2 days and stop        01/13/2019  f/u ov/Sue Mitchell re:  Asthma/ vcd reduced gabapentin to 100 bid and ppi to qd and no change in cough  Chief Complaint  Patient presents with  . Follow-up    Breathing is overall doing well. She has not been using her albuterol regularly.   Dyspnea:  MMRC2 = can't walk a nl pace on a flat Mitchell s sob but does fine slow and flat  X one month  Cough: better than usual  Sleeping:  3  pillows  SABA use: none  02: none Onset of new pain positional L  shoulder posteriorly x 2 weeks  > ortho eval years dx "muscle knots" same area  Better with heating pad / no neck pain, no radicular symptoms or numbness rec Increase gabapentin to 100  Mg four times daily until all cough and pain are better  For muscle spasm try robaxin 750  four times a day as needed and call  your othopedic doctor Doneen Poisson) for follow up [phone]   07/19/2019  f/u ov/Sue Mitchell re: asthma / vcd on pred daily from ortho / on dulera 200 2bid Chief Complaint  Patient presents with  . Follow-up    ptstates allergies and pollen aggravated sinus. wants handicap placard  Dyspnea: MMRC2 = can't walk a nl pace on a flat Mitchell s sob but does fine slow and flat  Cough: none despite flare of nasal congestion Sleeping: on back/ 2 pillows/ bed is flat SABA use: minimal 02: none rec No change rx    03/01/2020  f/u ov/Sue Mitchell re: asthma/uacs doing poorly since Oct 2021 maint  On dulera 100 / gabapentin 100 mg qid /ust finished prednisone over xmas /  Only using ppi qd and not taking gabapentin 100 qid during flare of cough/ wheeze/ sob instead relying more and more on saba including neb  Chief Complaint  Patient presents with  . Acute Visit    Increased SOB, wheezing and cough for the past month. She is using her neb every am before [facility] and sometimes using it before bed. She is using her albuterol inhaler once daily on average.   Dyspnea:  Does fine unless misses dose of albuterol  Cough: severe / min clear  mucus Sleeping: bed is flat / pillows - no bed blocks as rec  SABA use: way too much  02: none  Only on one ppi daily due to bloating   No obvious day to day or daytime variability or assoc excess/ purulent sputum or mucus plugs or hemoptysis or cp or chest tightness, subjective wheeze or overt sinus or hb symptoms.   Sleeping most nights  without nocturnal  or early am exacerbation  of respiratory  c/o's or need for noct saba. Also denies any obvious fluctuation of symptoms with weather or environmental changes or other aggravating or alleviating factors except as outlined above   No unusual exposure hx or h/o childhood pna/ asthma or knowledge of premature birth.  Current Allergies, Complete Past Medical History, Past Surgical History, Family History, and Social History were reviewed in [employer] record.  ROS  The following are not active complaints unless bolded Hoarseness, sore throat, dysphagia, dental problems, itching, sneezing,  nasal congestion or discharge of excess mucus or purulent secretions, ear ache,   fever, chills, sweats, unintended wt loss or wt gain, classically pleuritic or exertional cp,  orthopnea pnd or arm/hand swelling  or leg swelling, presyncope, palpitations, abdominal pain, anorexia, nausea, vomiting, diarrhea  or change in bowel habits or change in bladder habits, change in stools or change in urine, dysuria, hematuria,  rash, arthralgias, visual complaints, headache, numbness, weakness or ataxia or problems with walking or coordination,  change in mood= anxious  or  memory.        Current Meds  Medication Sig  . acyclovir (ZOVIRAX) 200 MG capsule Take 200 mg by mouth 2 (two) times daily as needed (outbreaks).   Marland Kitchen albuterol (VENTOLIN HFA) 108 (90 Base) MCG/ACT inhaler Inhale 2 puffs into the lungs every 6 (six) hours as needed for wheezing or shortness of breath.  Marland Kitchen amLODipine (NORVASC) 5 MG tablet Take 1 tablet (5 mg total) by mouth  daily.  . DULERA 100-5 MCG/ACT AERO Inhale 2 puffs into the lungs 2 (two) times daily.  Marland Kitchen gabapentin (NEURONTIN) 100 MG capsule Take 2 pills 4 x daily  . hydrochlorothiazide (MICROZIDE) 12.5 MG capsule Take 1 capsule (12.5 mg total) by mouth daily.  Marland Kitchen  pantoprazole (PROTONIX) 40 MG tablet Take 1 tablet (40 mg total) by mouth 2 (two) times daily before a meal.  . valsartan (DIOVAN) 40 MG tablet Take 1 tablet by mouth daily.                              Objective:   Physical Exam      03/01/2020           171 07/19/2019        173  01/13/2019      175  10/21/2018        172  09/01/2018          169 07/23/2018        170  05/04/2018         170  04/20/2018       169  04/08/2018       164  03/26/2018       173  03/11/2018       173  12/04/2017     170  11/04/2017       169  10/21/2017       169  02/06/2017     171   12/26/16 171 lb 6.4 oz (77.7 kg)  11/18/13 170 lb (77.1 kg)  03/21/11 171 lb (77.6 kg)   Vital signs reviewed  03/01/2020  - Note at rest 02 sats  96% on RA   General appearance:    Amb bf nad after am Neb.    HEENT : pt wearing mask not removed for exam due to covid -19 concerns.    NECK :  without JVD/Nodes/TM/ nl carotid upstrokes bilaterally   LUNGS: no acc muscle use,  Nl contour chest  With "wheeze" on FVC, eliminated with plb   CV:  RRR  no s3 or murmur or increase in P2, and no edema   ABD:  soft and nontender with nl inspiratory excursion in the supine position. No bruits or organomegaly appreciated, bowel sounds nl  MS:  Nl gait/ ext warm without deformities, calf tenderness, cyanosis or clubbing No obvious joint restrictions   SKIN: warm and dry without lesions    NEURO:  alert, approp, nl sensorium with  no motor or cerebellar deficits apparent.           I personally reviewed images and agree with radiology impression as follows:  CXR:   02/07/20 Bronchitis pattern. No consolidation or collapse.                    Assessment:

## 2020-03-01 NOTE — Assessment & Plan Note (Addendum)
Quit smoking 2011  Recurrent cough since teenager really bad and daily since Aug 2018 12/26/2016  rec symbicort 80 2bid then return for full pfts - PFT's  02/06/2017  FEV1 1.37 (80 % ) ratio 67  p 5 % improvement from saba p symb 80  prior to study with DLCO  66/66c % corrects to 79  % for alv volume   - Spirometry 10/21/2017  FEV1 1.18 (70%)  Ratio 71 with min curvature during attack of cough/ mostly pseudowheeze on exam on symb 80 x2  - FENO 10/21/2017  =   7  On symb 80 x 2  - 11/04/2017     90% with spacer  - singulair added 11/04/2017  But not taking consistently as of 12/04/2017 > rec restart daily  X one week and stopped it due "dizzy"  - PFT's  12/04/2017  FEV1 1.34 (85 % ) ratio 67  p 20 % improvement from saba p nothing prior to study with DLCO  71 % corrects to 80  % for alv volume   - Allergy profile 12/04/2017 >  Eos 0.0 /  IgE  546 dust only  - cyclical cough rx 03/11/2018  - Spirometry 03/26/2018  FEV1 1.1 (73%)  Ratio 0.64 - 03/26/2018  After extensive coaching inhaler device,  effectiveness =    90% s spacer  - Gabapentin 100 mg tid trial 03/26/2018> attempted rx by non-adherent  - Sinus CT 04/07/2018 > Negative study.  No evidence of paranasal sinusitis. - Spirometry 04/08/2018  FEV1 0.7 (47%)  Ratio 0.52 with convex curvature p am symb 80 x 2 and neb in office   - 04/08/2018  After extensive coaching inhaler device,  effectiveness =    75% with smi > added trial of spiriva 2pffs daily x 2 weeks then return  - CTa 04/11/18 for hemoptysis with refractory cough > Limited study due to respiratory motion. No obvious pulmonary embolism or other acute findings. - 04/20/2018  After extensive coaching inhaler device,  effectiveness =    0% with spiriva smi which caused immediate severe cough so rec d/c spiriva  - 04/20/2018 rechallenge with gabapentin 100 mg bid  - 05/04/2018  After extensive coaching inhaler device,  effectiveness =    90% with spacer - s spacer starts coughing immediately  -  Spirometry 05/04/2018  FEV1 1.2 (75%)  Ratio 0.65 with mild curvature p symb 80 x 2 puffs   - 05/04/2018 increased gabapentin to 100 tid   - 05/25/2018 increased gabapentin to  100 mg qid > improved  - 07/23/2018 try ppi just in am  07/23/2018  After extensive coaching inhaler device,  effectiveness =    90% with spacer from baseline 75%  - 09/01/2018  changed to dulera 100 due to cost   - 1/26/2021changed to dulera 200 2bid     DDX of  difficult airways management almost all start with A and  include Adherence, Ace Inhibitors, Acid Reflux, Active Sinus Disease, Alpha 1 Antitripsin deficiency, Anxiety masquerading as Airways dz,  ABPA,  Allergy(esp in young), Aspiration (esp in elderly), Adverse effects of meds,  Active smoking or vaping, A bunch of PE's (a small clot burden can't cause this syndrome unless there is already severe underlying pulm or vascular dz with poor reserve) plus two Bs  = Bronchiectasis and Beta blocker use..and one C= CHF   Adherence is always the initial "prime suspect" and is a multilayered concern that requires a "trust but verify" approach in  every patient - starting with knowing how to use medications, especially inhalers, correctly, keeping up with refills and understanding the fundamental difference between maintenance and prns vs those medications only taken for a very short course and then stopped and not refilled.  - note not taking gabapentin as rx > try 100 mg qid  ? Acid (or non-acid) GERD > always difficult to exclude as up to 75% of pts in some series report no assoc GI/ Heartburn symptoms> rec max (24h)  acid suppression and diet restrictions/ reviewed and instructions given in writing.  >>> change to aciphex 20 mg bid ac as causes less "gas" vs the alternatives  ? Allergy/ asthma > doubt, should be able to cover with the 100 dulera since cough is so prominent Re saba: I spent extra time with pt today reviewing appropriate use of albuterol for prn use on exertion  with the following points: 1) saba is for relief of sob that does not improve by walking a slower pace or resting but rather if the pt does not improve after trying this first. 2) If the pt is convinced, as many are, that saba helps recover from activity faster then it's easy to tell if this is the case by re-challenging : ie stop, take the inhaler, then p 5 minutes try the exact same activity (intensity of workload) that just caused the symptoms and see if they are substantially diminished or not after saba 3) if there is an activity that reproducibly causes the symptoms, try the saba 15 min before the activity on alternate days   If in fact the saba really does help, then fine to continue to use it prn but advised may need to look closer at the maintenance regimen being used to achieve better control of airways disease with exertion.    ? Anxiety/depression  > usually at the bottom of this list of usual suspects but should be much higher on this pt's based on H and P and has already been on psychotropics and may interfere with adherence and also interpretation of response or lack thereof to symptom management which can be quite subjective.   Rec: Off work x 2 weeks then ov with all meds in hand using a trust but verify approach to confirm accurate Medication  Reconciliation The principal here is that until we are certain that the  patients are doing what we've asked, it makes no sense to ask them to do more.           Each maintenance medication was reviewed in detail including emphasizing most importantly the difference between maintenance and prns and under what circumstances the prns are to be triggered using an action plan format where appropriate.  Total time for H and P, chart review, counseling, teaching device use  and generating customized AVS unique to this office visit / charting > 30 min

## 2020-03-01 NOTE — Patient Instructions (Addendum)
Stop pantoprazole and start aciphex 20 mg Take 30- 60 min before your first and last meals of the day   Gabapentin 100 mg four times daily consisently    For cough /congestion  > mucinex dm 1200 mg every 12 hours and use flutter valve as much as possible   No change the other medications for now  No work x next 2 weeks due to poorly controlled cough/wheeze/ congestion  Please schedule a follow up office visit in 2 weeks, sooner if needed  with all medications /inhalers/ solutions in hand so we can verify exactly what you are taking. This includes all medications from all doctors and over the counters

## 2020-03-09 DIAGNOSIS — Z1231 Encounter for screening mammogram for malignant neoplasm of breast: Secondary | ICD-10-CM | POA: Diagnosis not present

## 2020-03-16 ENCOUNTER — Ambulatory Visit (INDEPENDENT_AMBULATORY_CARE_PROVIDER_SITE_OTHER): Payer: BC Managed Care – PPO | Admitting: Internal Medicine

## 2020-03-16 ENCOUNTER — Other Ambulatory Visit: Payer: Self-pay

## 2020-03-16 ENCOUNTER — Encounter: Payer: Self-pay | Admitting: Internal Medicine

## 2020-03-16 DIAGNOSIS — J45991 Cough variant asthma: Secondary | ICD-10-CM | POA: Diagnosis not present

## 2020-03-16 MED ORDER — GABAPENTIN 100 MG PO CAPS: 100.0000 mg | ORAL_CAPSULE | Freq: Three times a day (TID) | ORAL | 3 refills | Status: DC

## 2020-03-16 NOTE — Patient Instructions (Addendum)
Treatment consists of avoiding foods that cause gas (especially boiled eggs, mexcican food but especially  beans and undercooked vegetables like  spinach and some salads)  and citrucel 1 heaping tsp twice daily with a large glass of water   Avoid broccoli and cabbage for now  Push gabapentin as high as  300 mg 4x daily if you still feel like something stuck in your throat for at least a week   No work x 2 weeks then return to clinic with all your medications Add consider decreasing dulera to 100 2 bid

## 2020-03-16 NOTE — Progress Notes (Signed)
Subjective:    Patient ID: Sue Mitchell, female   DOB: [birth year]     MRN: [id number]   Brief patient profile:  64 yobf  RT quit smoking 2011 @ wt 140  with h/o sinus symptoms assoc with cough and need for saba prn as teenager while living in [place] but after arrival here at age late 38's it changed to point where bothered her mostly with onset of cold weather typically req ov rx  abx and prednisone help a lot and this pattern continued even after quit smoking to where the episodes last longer and onset of this not directed linked to cold weather in fact  had one April 2018 never  Completely cleared  then flared again in Oct 01 2016 severe dry cough / eval by allergist /ent since onset neg w/u so referred by Dr Doran Heater to pulmonary clinic 12/26/16     History of Present Illness  12/26/2016 1st [name]   Chief Complaint  Patient presents with  . Pulm Consult    Pt referred by Dr. Billy Fischer ENT. Pt has productive cough-clear mucus sometimes yellow. Pt had horseness in voice for 6-8 weeks, no chills or fever, have some chest pain and tightness with cough.  recurrent cough since April 2018 never resolved p rx as uri then flaired early August 2018 rx zpak/pred/saba> some better  Cough was worse at hs now   Am feels wheezy not really coughing much up but what she does is white and < 1 tbsp Cough seems worse with exp to cold air at work  Assoc overt hb better on zantac  Some gen ant chest discomfort with severe coughing fits  rec Plan A = Automatic =  symbicort 80 Take 2 puffs first thing in am and then another 2 puffs about 12 hours later.  Plan B = Backup Only use your albuterol as a rescue medication  Start zantac 150- 300 mg after bfast and after supper until cough better  GERD diet    02/06/2017  f/u ov/Sue Mitchell re:  GOLD I copd/ cough on cold air exp  Chief Complaint  Patient presents with  . Follow-up    Cough had improved but then worsened again  with colder weather. She is using her albuterol inhaler 2 x per wk on average.   not limited by doe / steps ok except for knees  Some gerd at hs taking h2 am only  rec Pantoprazole (protonix) 40 mg  Take  30-60 min before first meal of the day and Zantac  300 mg bedtime until return to office - this is the best way to tell whether stomach acid is contributing to your problem.   GERD diet  Please schedule a follow up office visit in 6 weeks, call sooner if needed     10/21/2017  f/u ov/Sue Mitchell re: acute refractory cough / saw allergist cannot name "all neg"  ? [name]  Chief Complaint  Patient presents with  . Follow-up    Last seen by [facility] 02/06/17. Patient states she has had a productive cough, post nasal drip, and constantly using her voice. At night, she becomes so congested that she has to breathe through her mouth instead of nose.    from last ov until 09/25/17 fine on on ranitidine one bid/symb 80 2bid Never took protonix  Was still needing saba twice a week at baseline and a lot more while working at [employer]  Then abruptly  worse Aug 1  lots sinus drainage clear > zpak early in Aug by [name] some better for a week or two then worse says ran out of symbicort one day prior to ov / using lots of saba now and severe nasal congestion as well as hoarseness and can't sleep due to cough rec Plan A = Automatic = symbiocort 80 Take 2 puffs first thing in am and then another 2 puffs about 12 hours later.  Plan B = Backup Only use your albuterol as a rescue medication  Prednisone 10 mg take  4 each am x 2 days,   2 each am x 2 days,  1 each am x 2 days and stop  Pantoprazole (protonix) 40 mg   Take  30-60 min before first meal of the day and ranitidine 300 mg each evening GERD diet      11/04/2017  f/u ov/Sue Mitchell re: atypical asthma in RT  Chief Complaint  Patient presents with  . Follow-up    Pt c/o sneezing, wheezing and cough with clear sputum- started after she mowed her lawn 1 day ago. She  has had to use her albuterol inhaler.   wakes up feeling good most am's s cough/ wheeze about 30 min later takes first 2 pffs of symb 80 around 5 15 am  Then next 2 pffs after 9 pm  Had been doing great prior mowing grass one day prior to OV Rarely need saba since last flare    Not limited by breathing from desired activities   rec Continue symbicort 80 Take 2 puffs first thing in am and then another 2 puffs about 12 hours later thru spacer Add singulair 10 mg each pm    12/04/2017  f/u ov/Sue Mitchell re:   symb 80 2bid/ singualir sometimes  Chief Complaint  Patient presents with  . Follow-up    PFT today, continues to have cough with hoarseness. She uses her albuterol inhaler 1 x per wk on average.   Dyspnea:  fine Cough: p grass exp / chemicals/ cologne dry Sleeping: flat/ one pillow no noct symptms SABA use: rare 02: none   Flare of cough / drainage 12/01/17 > has not tried otc's Plan A = Automatic = symbicort 80 Take 2 puffs first thing in am and then another 2 puffs about 12 hours later and remember to breath it out through your nose  singulair  10 mg every evening  Plan B = Backup Only use your albuterol as a rescue medication  For nasal symptoms > zyrtec 10 mg on at bedtime as needed > too sleepy    02/20/18 NP ov rec Augmentin/ pred/ antihistamine/singulair > no better and could not take singulair / did not try zyrtec in am "makes me sleepy"     03/11/2018 extended  f/u ov/Sue Mitchell re: refractory  Cough since aug 2018    Chief Complaint  Patient presents with  . Follow-up    Cough not improving. She is coughing up clear to white sputum. She states her cough is worse when she goes to work and when she first lies down at night. She is using her albuterol inhaler once daily on average.   Dyspnea:  Not limited by breathing from desired activities   Cough: worse around 5pm  Then immediately when supine x then some during the night, never took zyrtec at hs as rec with sense of globus and  throat / chest "congestion" but no excess mucus  Sleeping: on flat bed on side  SABA use: ?  if neb helps more than hfa - not really  Sure but "it sure makes me shake" Still using mint products  rec Symbicort 80  Take 2 puffs first thing in am and then another 2 puffs about 12 hours later.  Pantoprazole (protonix) 40 mg   Take  30-60 min before first meal of the day and Pepcid (famotidine)  20 mg one @  bedtime until return to office - this is the best way to tell whether stomach acid is contributing to your problem.   GERD diet  Try zyrtec 10 mg after supper  Take delsym two tsp every 12 hours and supplement if needed with  vicodin up to 1every 4 hours to suppress the urge to cough. Swallowing water and/or using ice chips/non mint and menthol containing candies (such as lifesavers or sugarless jolly ranchers) are also effective.  You should rest your voice and avoid activities that you know make you cough. Once you have eliminated the cough for 3 straight days try reducing the vicodin first,  then the delsym as tolerated.   On 03/14/18 Prednisone 10 mg take  4 each am x 2 days,   2 each am x 2 days,  1 each am x 2 days and stop  Please schedule a follow up office visit in 2 weeks, sooner if needed  with all medications /inhalers/ solutions in hand so we can verify exactly what you are taking. This includes all medications from all doctors and over the counters    03/26/2018  f/u ov/Sue Mitchell re: refractory cough/ wheeze on symb 80 2bid  Chief Complaint  Patient presents with  . Follow-up    Pt c/o sinus congestion, cough with yellow sputum and wheezing. She is using her albuterol inhaler once per wk on average.   Dyspnea:  Ok unless cough Cough:  at work and at home same severity/ frequency  Sleeping: not coughing while sleeping  SABA use:  Not really helping 02: none  gen ant chest soreness from coughing/ not able to use vicodin daytime to suppress cough rec  Only use your albuterol as a rescue  medication  Please see patient coordinator before you leave today  to schedule sinus CT Please remember to go to the  x-ray department  for your tests - we will call you with the results when they are available  Please schedule a follow up office visit in 2 weeks, sooner if needed  with all medications /inhalers/ solutions in hand so we can verify exactly what you are taking. This includes all medications from all doctors and over the counters       04/08/2018  f/u ov/Sue Mitchell re: refractory cough / brought just her inhalers to [facility]   Chief Complaint  Patient presents with  . Acute Visit    Pt has had complaints of sinius drainage and a cough x4 days and also states she has had problems sleeping. Pt also has been hurting everywhere, feeling weak, and has had sweats.  Dyspnea: usually only sob when coughing Cough: white mucus / min production "but feels like it's choking her"  Sleeping: on side bed pillows too heavy for bed blocks/ worse cough now at hs / not using cough suppression as advised  SABA use: rarely / symbicort count on 50 so missed about 14 doses or continued to use beyond the red line on the prior/ warned about both 02: none Reported after much back and forth "felt good only while on prednisone" and gradually worse  off it  - turns out "felt good" does not mean the cough was gone but breathing was better. rec Prednisone 10mg   Take 4 for three days 3 for three days 2 for three days 1 for three days and stop Plan A = Automatic = Symbicort 80 Take 2 puffs first thing in am and then another 2 puffs about 12 hours later and spiriva 2 puff each am  Plan B = Backup Only use your albuterol inhaler as a rescue medication   Try gabapentin 100 mg twice daily bfast and supper and if can't take it twice daily just take at bedtime  For drainage / throat tickle try take CHLORPHENIRAMINE  4 mg (chlortabs  [facility])   Stay on protonix Take 30- 60 min before your first and last meals of the day   Take delsym two tsp every 12 hours and supplement if needed with  vicodine  up to 2 every 4 hours to suppress the urge to cough. .  Please schedule a follow up office visit in 2 weeks, sooner if needed  with all medications /inhalers/ solutions in hand so we can verify exactly what you are taking. This includes all medications from all doctors and over the counters - needs spirometry on return     Admit date: 04/14/2018 Discharge date: 04/18/2018   Equipment/Devices: Nebulizer with albuterol sol'n, 2L oxygen ordered several days prior to discharge. Apparently patient left stating she would get the nebulizer and oxygen from [facility] and did not want to wait for it to be delivered to the room per care management. Per CM note, this will be delivered to the home.   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 At the time of discharge she reportedly maintained sufficient oxygenation during ambulation, but in the ED she was found to be hypoxic, wheezing, coughing, and weak, and was subsequently readmitted. With continued treatment including IV steroids she's subjectively significantly improved. She was weaned from oxygen while at rest and still desaturates on ambulation. She is discharged with DME in stable condition with follow up in 48 hours.  Discharge Diagnoses:  Principal Problem:   Acute respiratory failure with hypoxia (HCC)   Cough variant asthma  vs uacs wit vcd   Influenza   Cough  Acute hypoxic respiratory failure due to influenza A and asthma exacerbation:  - Completed5 days of tamiflu - Continue symbicort, spiriva respimat - Scheduled and prnalbuterol nebulizer (provided at DC) -Wheezing has improved so converted to po steroid with improvement, has prednisone at home, ordered to  take 40mg  po daily until follow up.  - Continue supplemental oxygen to maintain SpO2 >90%. Despite significant improvement, does still need O2 at discharge. Will anticipate continued improvement and liberation from oxygen, will need recheck at follow up office visit with pulmonology the coming week. - Continue antitussive. PMPAware queried, has gotten hydrocodone-APAP from Dr. 3/18 2/12 and cough syrup intermittently from PCP.   Hemoptysis: CTA chest motion-degraded at last admission, no PE.  - Resolved.   Hypertension:  - Continue norvasc and HCTZ since recently stopping ARB for concern of exacerbating cough.   GERD:  - Continue PPI    04/20/2018  Extended f/u ov/Sue Mitchell re: post hosp f/u  Chief Complaint  Patient presents with  . Follow-up    Breathing has improved. She is not using her  albuterol inhaler but is using albuterol neb about 3 x per day.   Dyspnea:  MMRC2 = can't walk a nl pace on a flat grade s sob but does fine slow and flat  Cough: gone/ min urge to clear throat but now on tussionex Sleeping: side / pillows to prop up  SABA use: way over using as above  02: none at all  rec Continue pantoprazole 40 mg Take 30- 60 min before your first and last meals of the day  Gabapentin  Restart 100 mg twice daily bfast and bedtime Plan A = Automatic = Symbicort 80 Take 2 puffs first thing in am and then another 2 puffs about 12 hours later.  Work on inhaler technique:   Plan B = Backup Only use your albuterol inhaler as a rescue medication  Plan C = Crisis - only use your albuterol nebulizer if you first try Plan B and it fails to help > ok to use the nebulizer up to every 4 hours but if start needing it regularly call for immediate appointment Finish the prednisone and stop spiriva F/u in 2 weeks with meds in hand (#35 on present symb 80 and one sample given) Add:  Consider performist/bud neb if can't wean off pred/saba neb and hfa makes her cough worse due to effects on upper  airway    05/04/2018  f/u ov/Sue Mitchell re:   symbicort 80 one full at home and total of 45 puffs samples/ 32 gabapenitn  Chief Complaint  Patient presents with  . Acute Visit    Cough has improved but has not resolved. She is coughing up min, clear sputum. She has not had to use her albuterol inhaler.   Dyspnea:  MMRC1 = can walk nl pace, flat grade, can't hurry or go uphills or steps s sob  Cough: p exertion Sleeping: fine 30 degrees on pillows  SABA use: no saba  02: no rec Increase gabapentin to 100 mg three times a day  ok to return to work 05/16/2018  Please schedule a follow up office visit in 3 weeks, sooner if needed  with all medications /inhalers/ solutions in hand so we can verify exactly what you are taking. This includes all medications from all doctors and over the counters        History of Present Illness: 05/25/2018   televist/Blessed Girdner re: cough/ sob symbicort 80 2bid /still on samples "half full"  Dyspnea:  Walked for 5 minutes some inclines  Cough: some daytime / dry assoc with hoarseness  Sleeping: flat bed/ 3 pillows  SABA use: none   rec For drainage / throat tickle try take CHLORPHENIRAMINE  4 mg (chlortab 4 mg) - take one every 4 hours as needed - available over the counter- may cause drowsiness so start with just a bedtime dose or two and see how you tolerate it before trying in daytime   Increase gabapentin to 100 mg four times daily  Continue Symbicort 80 Take 2 puffs first thing in am and then another 2 puffs about 12 hours later (use coupon and let me know if they won't honor it  I will take you out of work until end of April 2020      06/19/18 Defer work note to Dr Parke Simmers re back For drainage / throat tickle try take CHLORPHENIRAMINE  4 mg  (Chlortab 4mg   at should be easiest to find in the green box)  take one every 4 hours as needed - available  over the counter- may cause drowsiness so start with just a bedtime dose or two and see how you  tolerate it before trying in daytime   Elevate head of bed on 6-8 inch bed block   07/23/2018  f/u ov/Sue Mitchell re: asthma with uacs  On symb 80 2bid , gapentin 100 qid /ppi bid Chief Complaint  Patient presents with  . Follow-up    Breathing is much improved. She is coughing minimally- mainly in the am's. She has not had to use her rescue inhaler in over a month.  Dyspnea:  Walks x 20 mins s stopping, some hills Cough: minimal/ non productive, day >noct  Sleeping: bed is flat  3pillows for back SABA use: none   rec No change in medications Ok to leave off pm dose of pantaprozole if not coughing Ok to return to work without restrictions on August 10 2018    09/01/2018  f/u ov/Sue Mitchell re: asthma  Vs vcd back on ppi bid / not keeping up with symbicort rx due to cost but did not contact us re this issue and just stopped taking  Chief Complaint  Patient presents with  . Follow-up    increased cough for a wk- prod with white sputum. She is also wheezing and has had to use her albuterol 4 x since her last visit.   Dyspnea:  Not limited by breathing from desired activities  / up and down steps  Cough: gradually worse x one week / min sputum mostly in ams Sleeping: at least once per night wakes up needing saba  SABA use: 3-4 x since 02: none  rec Plan A = Automatic = dulera 100 Take 2 puffs first thing in am and then another 2 puffs about 12 hours later.  Plan B = Backup Only use your albuterol (proair)  inhaler as a rescue medication to be used if you can't catch your breath by resting or doing a relaxed purse lip breathing pattern.  - The less you use it, the better it will work when you need it. - Ok to use the inhaler up to 2 puffs  every 4 hours if you must but call for appointment if use goes up over your usual need - Don't leave home without it !!  (think of it like the spare tire for your car)  Prednisone 10 mg take  4 each am x 2 days,   2 each am x 2 days,  1 each am x 2 days and stop        01/13/2019  f/u ov/Sue Mitchell re:  Asthma/ vcd reduced gabapentin to 100 bid and ppi to qd and no change in cough  Chief Complaint  Patient presents with  . Follow-up    Breathing is overall doing well. She has not been using her albuterol regularly.   Dyspnea:  MMRC2 = can't walk a nl pace on a flat grade s sob but does fine slow and flat  X one month  Cough: better than usual  Sleeping:  3  pillows  SABA use: none  02: none Onset of new pain positional L  shoulder posteriorly x 2 weeks  > ortho eval years dx "muscle knots" same area  Better with heating pad / no neck pain, no radicular symptoms or numbness rec Increase gabapentin to 100  Mg four times daily until all cough and pain are better  For muscle spasm try robaxin 750  four times a day as needed and call your  othopedic doctor Doneen Poisson(Jessina Marse Hiltz) for follow up [phone]       03/01/2020  f/u ov/Sue Mitchell re: asthma/uacs doing poorly since Oct 2021 maint  On dulera 100 / gabapentin 100 mg qid /just finished prednisone over xmas /  Only using ppi qd and not taking gabapentin 100 qid during flare of cough/ wheeze/ sob instead relying more and more on saba including neb  Chief Complaint  Patient presents with  . Acute Visit    Increased SOB, wheezing and cough for the past month. She is using her neb every am before [facility] and sometimes using it before bed. She is using her albuterol inhaler once daily on average.   Dyspnea:  Does fine unless misses dose of albuterol  Cough: severe / min clear mucus Sleeping: bed is flat / pillows - no bed blocks as rec  SABA use: way too much  02: none  Only on one ppi daily due to bloating rec Stop pantoprazole and start aciphex 20 mg Take 30- 60 min before your first and last meals of the day  Gabapentin 100 mg four times daily consisently   For cough /congestion  > mucinex dm 1200 mg every 12 hours and use flutter valve as much as possible  No change the other medications for now No work x next 2  weeks due to poorly controlled cough/wheeze/ congestion Please schedule a follow up office visit in 2 weeks, sooner if needed  with all medications /inhalers/ solutions in hand so we can verify exactly what you are taking. This includes all medications from all doctors and over the counters   03/16/2020  f/u ov/Sue Mitchell re:  Recurrent cough x 30's assoc with voice loss Chief Complaint  Patient presents with  . Follow-up    Cough-clear and white, wheezing in am, sob same  Dyspnea:  Can't do hills at home due to L knee  Cough: sporadic / tickle in throat daytime / minimal white mucus/ can't use higher dose of gabapentin at work though hasn't been working due to cough  Sleeping: on side/ pillows no  bed blocks yet  SABA use: twice  A week 02: none avg gabapentin   = 100 mg  4 per day     No obvious day to day or daytime variability or assoc excess/ purulent sputum or mucus plugs or hemoptysis or cp or chest tightness, subjective wheeze or overt sinus or hb symptoms.   Sleeping  without nocturnal  or early am exacerbation  of respiratory  c/o's or need for noct saba. Also denies any obvious fluctuation of symptoms with weather or environmental changes or other aggravating or alleviating factors except as outlined above   No unusual exposure hx or h/o childhood pna/ asthma or knowledge of premature birth.  Current Allergies, Complete Past Medical History, Past Surgical History, Family History, and Social History were reviewed in [facility] electronic medical record.  ROS  The following are not active complaints unless bolded Hoarseness, sore throat, dysphagia, dental problems, itching, sneezing,  nasal congestion or discharge of excess mucus or purulent secretions, ear ache,   fever, chills, sweats, unintended wt loss or wt gain, classically pleuritic or exertional cp,  orthopnea pnd or arm/hand swelling  or leg swelling, presyncope, palpitations, abdominal pain, anorexia, nausea, vomiting,  diarrhea  or change in bowel habits or change in bladder habits, change in stools or change in urine, dysuria, hematuria,  rash, arthralgias, visual complaints, headache, numbness, weakness or ataxia or problems with walking  or coordination,  change in mood or  memory.        Current Meds  Medication Sig  . acyclovir (ZOVIRAX) 200 MG capsule Take 200 mg by mouth 2 (two) times daily as needed (outbreaks).   Marland Kitchen albuterol (VENTOLIN HFA) 108 (90 Base) MCG/ACT inhaler Inhale 2 puffs into the lungs every 6 (six) hours as needed for wheezing or shortness of breath.  Marland Kitchen amLODipine (NORVASC) 5 MG tablet Take 1 tablet (5 mg total) by mouth daily.  . DULERA 100-5 MCG/ACT AERO Inhale 2 puffs into the lungs 2 (two) times daily.  Marland Kitchen gabapentin (NEURONTIN) 100 MG capsule Take 2 pills 4 x daily  . hydrochlorothiazide (MICROZIDE) 12.5 MG capsule Take 1 capsule (12.5 mg total) by mouth daily.  . RABEprazole (ACIPHEX) 20 MG tablet Take 30- 60 min before your first and last meals of the day  . valsartan (DIOVAN) 40 MG tablet Take 1 tablet by mouth daily.                                    Objective:   Physical Exam     03/16/2020       175 03/01/2020           171 07/19/2019        173  01/13/2019      175  10/21/2018        172  09/01/2018          169 07/23/2018        170  05/04/2018         170  04/20/2018       169  04/08/2018       164  03/26/2018       173  03/11/2018       173  12/04/2017     170  11/04/2017       169  10/21/2017       169  02/06/2017     171   12/26/16 171 lb 6.4 oz (77.7 kg)  11/18/13 170 lb (77.1 kg)  03/21/11 171 lb (77.6 kg)    Vital signs reviewed  03/16/2020  - Note at rest 02 sats  98% on RA   General appearance:   Somber mildly hoarse bf nad      HEENT : pt wearing mask not removed for exam due to covid -19 concerns.    NECK :  without JVD/Nodes/TM/ nl carotid upstrokes bilaterally   LUNGS: no acc muscle use,  Nl contour chest with end exp wheeze   bilaterally better with PLM and without cough on insp or exp maneuvers   CV:  RRR  no s3 or murmur or increase in P2, and no edema   ABD:  soft and nontender with nl inspiratory excursion in the supine position. No bruits or organomegaly appreciated, bowel sounds nl  MS:  Nl gait/ ext warm without deformities, calf tenderness, cyanosis or clubbing No obvious joint restrictions   SKIN: warm and dry without lesions    NEURO:  alert, approp, nl sensorium with  no motor or cerebellar deficits apparent.              Assessment:

## 2020-03-17 ENCOUNTER — Encounter: Payer: Self-pay | Admitting: Internal Medicine

## 2020-03-17 ENCOUNTER — Telehealth: Payer: Self-pay | Admitting: Internal Medicine

## 2020-03-17 DIAGNOSIS — Z0289 Encounter for other administrative examinations: Secondary | ICD-10-CM

## 2020-03-17 NOTE — Assessment & Plan Note (Signed)
Quit smoking 2011  Recurrent cough since teenager really bad and daily since Aug 2018 12/26/2016  rec symbicort 80 2bid then return for full pfts - PFT's  02/06/2017  FEV1 1.37 (80 % ) ratio 67  p 5 % improvement from saba p symb 80  prior to study with DLCO  66/66c % corrects to 79  % for alv volume   - Spirometry 10/21/2017  FEV1 1.18 (70%)  Ratio 71 with min curvature during attack of cough/ mostly pseudowheeze on exam on symb 80 x2  - FENO 10/21/2017  =   7  On symb 80 x 2  - 11/04/2017     90% with spacer  - singulair added 11/04/2017  But not taking consistently as of 12/04/2017 > rec restart daily  X one week and stopped it due "dizzy"  - PFT's  12/04/2017  FEV1 1.34 (85 % ) ratio 67  p 20 % improvement from saba p nothing prior to study with DLCO  71 % corrects to 80  % for alv volume   - Allergy profile 12/04/2017 >  Eos 0.0 /  IgE  546 dust only  - cyclical cough rx 03/11/2018  - Spirometry 03/26/2018  FEV1 1.1 (73%)  Ratio 0.64 - 03/26/2018  After extensive coaching inhaler device,  effectiveness =    90% s spacer  - Gabapentin 100 mg tid trial 03/26/2018> attempted rx by non-adherent  - Sinus CT 04/07/2018 > Negative study.  No evidence of paranasal sinusitis. - Spirometry 04/08/2018  FEV1 0.7 (47%)  Ratio 0.52 with concave curvature p am symb 80 x 2 and neb in office   - 04/08/2018  After extensive coaching inhaler device,  effectiveness =    75% with smi > added trial of spiriva 2pffs daily x 2 weeks then return  - CTa 04/11/18 for hemoptysis with refractory cough > Limited study due to respiratory motion. No obvious pulmonary embolism or other acute findings. - 04/20/2018  After extensive coaching inhaler device,  effectiveness =    0% with spiriva smi which caused immediate severe cough so rec d/c spiriva  - 04/20/2018 rechallenge with gabapentin 100 mg bid  - 05/04/2018  After extensive coaching inhaler device,  effectiveness =    90% with spacer - s spacer starts coughing immediately  -  Spirometry 05/04/2018  FEV1 1.2 (75%)  Ratio 0.65 with mild curvature p symb 80 x 2 puffs   - 05/04/2018 increased gabapentin to 100 tid   - 05/25/2018 increased gabapentin to  100 mg qid > improved  - 07/23/2018 try ppi just in am  07/23/2018  After extensive coaching inhaler device,  effectiveness =    90% with spacer from baseline 75%  - 09/01/2018  changed to dulera 100 due to cost   - 1/26/2021changed to dulera 200 2bid    - 03/16/2020 rec try back on dulera 100 2bid due to refractory cough   Really unclear how much of this is asthma vs irritable larynx aggravated potentially by high dose ics (despite use of spacer)  rec  Max for gerd/  UACS with as much gabapentin as she can tolerate to eliminate the globus sensation (max of 300 mg qid ) then regroup in 2 weeks to consider lower dose of dulera.   Can't work at this point and take adequate dose of gabapentin to control coughing fits (neither drowsines nor coughing fits would be appropriate would be appropriate for resp therapist.  Each maintenance medication was reviewed in detail including emphasizing most importantly the difference between maintenance and prns and under what circumstances the prns are to be triggered using an action plan format where appropriate.  Total time for H and P, chart review, counseling, reviewing hfa device(s) and generating customized AVS unique to this office visit / same day charting = 29 min

## 2020-03-17 NOTE — Telephone Encounter (Signed)
Patient brought FMLA paperwork with her to her appointment which Dr. [name] completed at time of visit. Patient also brought $15 money order for payment of forms. Faxed forms to [employer] @ [phone]. -pr

## 2020-03-21 DIAGNOSIS — H43811 Vitreous degeneration, right eye: Secondary | ICD-10-CM | POA: Diagnosis not present

## 2020-03-21 DIAGNOSIS — H52203 Unspecified astigmatism, bilateral: Secondary | ICD-10-CM | POA: Diagnosis not present

## 2020-03-21 DIAGNOSIS — H2513 Age-related nuclear cataract, bilateral: Secondary | ICD-10-CM | POA: Diagnosis not present

## 2020-03-21 DIAGNOSIS — H5213 Myopia, bilateral: Secondary | ICD-10-CM | POA: Diagnosis not present

## 2020-03-30 ENCOUNTER — Encounter: Payer: Self-pay | Admitting: *Deleted

## 2020-03-30 ENCOUNTER — Other Ambulatory Visit: Payer: Self-pay

## 2020-03-30 ENCOUNTER — Ambulatory Visit (INDEPENDENT_AMBULATORY_CARE_PROVIDER_SITE_OTHER): Payer: BC Managed Care – PPO | Admitting: Pulmonary Disease

## 2020-03-30 ENCOUNTER — Encounter: Payer: Self-pay | Admitting: Pulmonary Disease

## 2020-03-30 VITALS — BP 108/64 | HR 70 | Temp 97.5°F | Ht 59.0 in | Wt 174.4 lb

## 2020-03-30 DIAGNOSIS — J45991 Cough variant asthma: Secondary | ICD-10-CM | POA: Diagnosis not present

## 2020-03-30 DIAGNOSIS — R059 Cough, unspecified: Secondary | ICD-10-CM

## 2020-03-30 NOTE — Progress Notes (Signed)
@Patient  ID: , female    DOB: 08/23/1955, 65 y.o.   MRN: [id number]  Chief Complaint  Patient presents with  . Follow-up    Pt states she is doing better than she was at last visit and states her cough is better.    Referring provider: [id number], MD  HPI:  65 year old female former smoker followed in our office for cough variant asthma  PMH: Cough, history hemoptysis, history of sinusitis, chronic rhinitis, vocal cord dysfunction Smoker/ Smoking History: Former smoker. Quit 2011. 12.5 pack year smoker.  Maintenance: Dulera 100 Pt of: Dr. 2012   03/30/2020  - Visit   65 year old female former smoker fonder office for cough variant asthma.  Presenting today as a follow-up visit.  Patient reports that overall she is doing well.  Her cough has become much more controlled since starting gabapentin.  She is averaging 100 mg every 6 for management of cough neuropathy.  She remains adherent to [facility].  She continues to have management of acid reflux.  She reports adherence to all of her medications.  Patient is only had to utilize her rescue inhaler 1 or 2 times since last office visit in January/2022 with Dr. February/2022.  Patient plans to return to work starting tomorrow.  She needs a note for this.   Questionaires / Pulmonary Flowsheets:   ACT:  Asthma Control Test ACT Total Score  03/30/2020 18  07/19/2019 22    MMRC: No flowsheet data found.  Epworth:  No flowsheet data found.  Tests:   02/15/2019-CT chest without contrast-no abnormality to correspond to question findings on recent radiographs or acute findings, no pulmonary nodule, mild hepatic stenosis, aortic arthrosclerosis, mild centrilobular emphysema  05/04/2018-spirometry-FVC 1.8 (90% predicted), ratio 65, FEV1 1.2 (75% predicted)  12/04/2017-pulmonary function test-FVC 1.67 (83% predicted), postbronchodilator ratio 67, postbronchodilator FEV1 1.34 (85% predicted), positive bronchodilator response, DLCO  11.96 (71% predicted)  Allergy profile 12/04/2017 >  Eos 0.0 /  IgE  546 dust only   FENO:  Lab Results  Component Value Date   NITRICOXIDE 7 10/21/2017    PFT: PFT Results Latest Ref Rng & Units 12/04/2017 02/06/2017  FVC-Pre L 1.67 1.87  FVC-Predicted Pre % 83 85  FVC-Post L 1.99 2.04  FVC-Predicted Post % 98 92  Pre FEV1/FVC % % 67 70  Post FEV1/FCV % % 67 67  FEV1-Pre L 1.11 1.30  FEV1-Predicted Pre % 70 76  FEV1-Post L 1.34 1.37  DLCO uncorrected ml/min/mmHg 11.96 12.52  DLCO UNC% % 71 66  DLCO corrected ml/min/mmHg - 12.52  DLCO COR %Predicted % - 66  DLVA Predicted % 80 79  TLC L 4.63 4.53  TLC % Predicted % 109 101  RV % Predicted % 147 119    WALK:  No flowsheet data found.  Imaging: No results found.  Lab Results:  CBC    Component Value Date/Time   WBC 11.5 (H) 04/14/2018 2119   RBC 4.12 04/14/2018 2119   HGB 12.3 04/14/2018 2119   HCT 39.0 04/14/2018 2119   PLT 242 04/14/2018 2119   MCV 94.7 04/14/2018 2119   MCH 29.9 04/14/2018 2119   MCHC 31.5 04/14/2018 2119   RDW 13.8 04/14/2018 2119   LYMPHSABS 2.8 04/14/2018 2119   MONOABS 1.1 (H) 04/14/2018 2119   EOSABS 0.0 04/14/2018 2119   BASOSABS 0.0 04/14/2018 2119    BMET    Component Value Date/Time   NA 138 04/14/2018 2119   K 3.6 04/14/2018 2119  CL 97 (L) 04/14/2018 2119   CO2 30 04/14/2018 2119   GLUCOSE 181 (H) 04/14/2018 2119   BUN 14 04/14/2018 2119   CREATININE 0.87 04/14/2018 2119   CALCIUM 9.2 04/14/2018 2119   GFRNONAA >60 04/14/2018 2119   GFRAA >60 04/14/2018 2119    BNP No results found for: BNP  ProBNP No results found for: PROBNP  Specialty Problems      Pulmonary Problems   Cough variant asthma  vs uacs with vcd    Quit smoking 2011  Recurrent cough since teenager really bad and daily since Aug 2018 12/26/2016  rec symbicort 80 2bid then return for full pfts - PFT's  02/06/2017  FEV1 1.37 (80 % ) ratio 67  p 5 % improvement from saba p symb 80  prior to  study with DLCO  66/66c % corrects to 79  % for alv volume   - Spirometry 10/21/2017  FEV1 1.18 (70%)  Ratio 71 with min curvature during attack of cough/ mostly pseudowheeze on exam on symb 80 x2  - FENO 10/21/2017  =   7  On symb 80 x 2  - 11/04/2017     90% with spacer  - singulair added 11/04/2017  But not taking consistently as of 12/04/2017 > rec restart daily  X one week and stopped it due "dizzy"  - PFT's  12/04/2017  FEV1 1.34 (85 % ) ratio 67  p 20 % improvement from saba p nothing prior to study with DLCO  71 % corrects to 80  % for alv volume   - Allergy profile 12/04/2017 >  Eos 0.0 /  IgE  546 dust only  - cyclical cough rx 03/11/2018  - Spirometry 03/26/2018  FEV1 1.1 (73%)  Ratio 0.64 - 03/26/2018  After extensive coaching inhaler device,  effectiveness =    90% s spacer  - Gabapentin 100 mg tid trial 03/26/2018> attempted rx by non-adherent  - Sinus CT 04/07/2018 > Negative study.  No evidence of paranasal sinusitis. - Spirometry 04/08/2018  FEV1 0.7 (47%)  Ratio 0.52 with concave curvature p am symb 80 x 2 and neb in office   - 04/08/2018  After extensive coaching inhaler device,  effectiveness =    75% with smi > added trial of spiriva 2pffs daily x 2 weeks then return  - CTa 04/11/18 for hemoptysis with refractory cough > Limited study due to respiratory motion. No obvious pulmonary embolism or other acute findings. - 04/20/2018  After extensive coaching inhaler device,  effectiveness =    0% with spiriva smi which caused immediate severe cough so rec d/c spiriva  - 04/20/2018 rechallenge with gabapentin 100 mg bid  - 05/04/2018  After extensive coaching inhaler device,  effectiveness =    90% with spacer - s spacer starts coughing immediately  - Spirometry 05/04/2018  FEV1 1.2 (75%)  Ratio 0.65 with mild curvature p symb 80 x 2 puffs   - 05/04/2018 increased gabapentin to 100 tid   - 05/25/2018 increased gabapentin to  100 mg qid > improved  - 07/23/2018 try ppi just in am  07/23/2018  After  extensive coaching inhaler device,  effectiveness =    90% with spacer from baseline 75%  - 09/01/2018  changed to dulera 100 due to cost   - 1/26/2021changed to dulera 200 2bid   - 03/16/2020 rec try back on dulera 100 2bid due to refractory cough          Rhinitis, chronic  Acute maxillary sinusitis   Cough   Hemoptysis   Influenza   Acute respiratory failure with hypoxia (HCC)   Pulmonary infiltrate    Newly noted 01/13/2019 with nl CTa 04/11/2018 / quit smoking 2011  - Re CT s contrast 02/15/2019 >>> wnl           Allergies  Allergen Reactions  . Codeine Hives, Itching and Other (See Comments)    All over the body, including burning sensation.  . Ciprofloxacin Palpitations    Immunization History  Administered Date(s) Administered  . Influenza Split 11/25/2016  . Influenza,inj,Quad PF,6+ Mos 11/04/2017, 10/21/2018, 11/26/2019  . Moderna Sars-Covid-2 Vaccination 02/24/2019, 03/23/2019  . PFIZER(Purple Top)SARS-COV-2 Vaccination 01/17/2020    Past Medical History:  Diagnosis Date  . Abdominal distension   . Abdominal pain   . Arthritis   . Asthma   . Biliary colic   . COPD (chronic obstructive pulmonary disease) (HCC)   . Diarrhea   . GERD (gastroesophageal reflux disease)   . Hypertension   . Nausea     Tobacco History: Social History   Tobacco Use  Smoking Status Former Smoker  . Packs/day: 0.50  . Years: 25.00  . Pack years: 12.50  . Types: Cigarettes  . Quit date: 2011  . Years since quitting: 11.0  Smokeless Tobacco Never Used   Counseling given: Not Answered   Continue to not smoke  Outpatient Encounter Medications as of 03/30/2020  Medication Sig  . acyclovir (ZOVIRAX) 200 MG capsule Take 200 mg by mouth 2 (two) times daily as needed (outbreaks).   Marland Kitchen albuterol (VENTOLIN HFA) 108 (90 Base) MCG/ACT inhaler Inhale 2 puffs into the lungs every 6 (six) hours as needed for wheezing or shortness of breath.  Marland Kitchen amLODipine (NORVASC) 5 MG tablet Take 1  tablet (5 mg total) by mouth daily.  . DULERA 100-5 MCG/ACT AERO Inhale 2 puffs into the lungs 2 (two) times daily.  Marland Kitchen gabapentin (NEURONTIN) 100 MG capsule Take 1 capsule (100 mg total) by mouth 3 (three) times daily. One three times daily  . hydrochlorothiazide (MICROZIDE) 12.5 MG capsule Take 1 capsule (12.5 mg total) by mouth daily.  . RABEprazole (ACIPHEX) 20 MG tablet Take 30- 60 min before your first and last meals of the day  . valsartan (DIOVAN) 40 MG tablet Take 1 tablet by mouth daily.   No facility-administered encounter medications on file as of 03/30/2020.     Review of Systems  Review of Systems  Constitutional: Negative for activity change, fatigue and fever.  HENT: Negative for sinus pressure, sinus pain and sore throat.   Respiratory: Positive for cough. Negative for shortness of breath and wheezing.   Cardiovascular: Negative for chest pain and palpitations.  Gastrointestinal: Negative for diarrhea, nausea and vomiting.  Musculoskeletal: Negative for arthralgias.  Neurological: Negative for dizziness.  Psychiatric/Behavioral: Negative for sleep disturbance. The patient is not nervous/anxious.      Physical Exam  BP 108/64 (BP Location: Left Arm, Cuff Size: Normal)   Pulse 70   Temp (!) 97.5 F (36.4 C) (Other (Comment)) Comment (Src): wrist  Ht 4\' 11"  (1.499 m)   Wt 174 lb 6.4 oz (79.1 kg)   SpO2 99% Comment: RA  BMI 35.22 kg/m   Wt Readings from Last 5 Encounters:  03/30/20 174 lb 6.4 oz (79.1 kg)  03/16/20 175 lb 9.6 oz (79.7 kg)  03/01/20 171 lb 12.8 oz (77.9 kg)  02/07/20 171 lb 3.2 oz (77.7 kg)  07/19/19 173 lb (78.5  kg)    BMI Readings from Last 5 Encounters:  03/30/20 35.22 kg/m  03/16/20 35.47 kg/m  03/01/20 34.70 kg/m  02/07/20 34.58 kg/m  07/19/19 34.94 kg/m     Physical Exam Vitals and nursing note reviewed.  Constitutional:      General: She is not in acute distress.    Appearance: Normal appearance. She is obese.  HENT:      Head: Normocephalic and atraumatic.     Right Ear: Tympanic membrane, ear canal and external ear normal. There is impacted cerumen.     Left Ear: Tympanic membrane, ear canal and external ear normal. There is impacted cerumen.     Nose: Rhinorrhea present. No congestion.     Mouth/Throat:     Mouth: Mucous membranes are moist.     Pharynx: Oropharynx is clear.     Comments: +PND Eyes:     Pupils: Pupils are equal, round, and reactive to light.  Cardiovascular:     Rate and Rhythm: Normal rate and regular rhythm.     Pulses: Normal pulses.     Heart sounds: Normal heart sounds. No murmur heard.   Pulmonary:     Effort: Pulmonary effort is normal. No respiratory distress.     Breath sounds: No decreased air movement. Rhonchi (slight exp rhonchi, clears with cough) present. No decreased breath sounds, wheezing or rales.  Musculoskeletal:     Cervical back: Normal range of motion.  Skin:    General: Skin is warm and dry.     Capillary Refill: Capillary refill takes less than 2 seconds.  Neurological:     General: No focal deficit present.     Mental Status: She is alert and oriented to person, place, and time. Mental status is at baseline.     Gait: Gait normal.  Psychiatric:        Mood and Affect: Mood normal.        Behavior: Behavior normal.        Thought Content: Thought content normal.        Judgment: Judgment normal.       Assessment & Plan:   Cough variant asthma  vs uacs with vcd Plan: Continue gabapentin, for cough neuropathy Continue Dulera 100 Continue rescue inhaler Continue AcipHex  Cough Currently well controlled Doing well with gabapentin 100 mg every 6 for management of cough neuropathy Continue GERD, asthma, allergic rhinitis management    Return in about 4 months (around 07/28/2020), or if symptoms worsen or fail to improve, for Follow up with Dr. [name].   Coral Ceo, NP 03/30/2020   This appointment required 24 minutes of patient care (this  includes precharting, chart review, review of results, face-to-face care, etc.).

## 2020-03-30 NOTE — Assessment & Plan Note (Signed)
Currently well controlled Doing well with gabapentin 100 mg every 6 for management of cough neuropathy Continue GERD, asthma, allergic rhinitis management

## 2020-03-30 NOTE — Patient Instructions (Addendum)
You were seen today by Coral Ceo, NP  for:   1. Cough variant asthma  vs uacs with vcd 2. Cough  Continue current meds   Dulera 100  >>> 2 puffs in the morning right when you wake up, rinse out your mouth after use, 12 hours later 2 puffs, rinse after use >>> Take this daily, no matter what >>> This is not a rescue inhaler   Only use your albuterol as a rescue medication to be used if you can't catch your breath by resting or doing a relaxed purse lip breathing pattern.  - The less you use it, the better it will work when you need it. - Ok to use up to 2 puffs  every 4 hours if you must but call for immediate appointment if use goes up over your usual need - Don't leave home without it !!  (think of it like the spare tire for your car)    Follow Up:    Return in about 4 months (around 07/28/2020), or if symptoms worsen or fail to improve, for Follow up with Dr. [name].   Notification of test results are managed in the following manner: If there are  any recommendations or changes to the  plan of care discussed in office today,  we will contact you and let you know what they are. If you do not hear from [place], then your results are normal and you can view them through your  MyChart account , or a letter will be sent to you. Thank you again for trusting [place] with your care  - Thank you, [facility]    It is flu season:   >>> Best ways to protect herself from the flu: Receive the yearly flu vaccine, practice good hand hygiene washing with soap and also using hand sanitizer when available, eat a nutritious meals, get adequate rest, hydrate appropriately       Please contact the office if your symptoms worsen or you have concerns that you are not improving.   Thank you for choosing [facility] for your healthcare, and for allowing [place] to partner with you on your healthcare journey. I am thankful to be able to provide care to you today.   Elisha Headland FNP-C

## 2020-03-30 NOTE — Assessment & Plan Note (Signed)
Plan: Continue gabapentin, for cough neuropathy Continue Dulera 100 Continue rescue inhaler Continue AcipHex

## 2020-04-10 ENCOUNTER — Ambulatory Visit: Payer: BC Managed Care – PPO | Admitting: Internal Medicine

## 2020-04-23 ENCOUNTER — Other Ambulatory Visit: Payer: Self-pay | Admitting: Internal Medicine

## 2020-05-11 DIAGNOSIS — R7302 Impaired glucose tolerance (oral): Secondary | ICD-10-CM | POA: Diagnosis not present

## 2020-05-11 DIAGNOSIS — M130 Polyarthritis, unspecified: Secondary | ICD-10-CM | POA: Diagnosis not present

## 2020-05-11 DIAGNOSIS — E669 Obesity, unspecified: Secondary | ICD-10-CM | POA: Diagnosis not present

## 2020-05-11 DIAGNOSIS — I10 Essential (primary) hypertension: Secondary | ICD-10-CM | POA: Diagnosis not present

## 2020-06-07 ENCOUNTER — Ambulatory Visit (HOSPITAL_COMMUNITY)
Admission: EM | Admit: 2020-06-07 | Discharge: 2020-06-07 | Disposition: A | Discharge status: Home / Self Care | Payer: BC Managed Care – PPO | Attending: Internal Medicine | Admitting: Internal Medicine

## 2020-06-07 ENCOUNTER — Encounter (HOSPITAL_COMMUNITY): Payer: Self-pay

## 2020-06-07 ENCOUNTER — Other Ambulatory Visit: Payer: Self-pay

## 2020-06-07 DIAGNOSIS — R059 Cough, unspecified: Secondary | ICD-10-CM | POA: Insufficient documentation

## 2020-06-07 DIAGNOSIS — Z79899 Other long term (current) drug therapy: Secondary | ICD-10-CM | POA: Diagnosis not present

## 2020-06-07 DIAGNOSIS — R52 Pain, unspecified: Secondary | ICD-10-CM | POA: Diagnosis not present

## 2020-06-07 DIAGNOSIS — R509 Fever, unspecified: Secondary | ICD-10-CM | POA: Insufficient documentation

## 2020-06-07 DIAGNOSIS — R519 Headache, unspecified: Secondary | ICD-10-CM | POA: Diagnosis not present

## 2020-06-07 DIAGNOSIS — Z7951 Long term (current) use of inhaled steroids: Secondary | ICD-10-CM | POA: Insufficient documentation

## 2020-06-07 DIAGNOSIS — Z885 Allergy status to narcotic agent status: Secondary | ICD-10-CM | POA: Diagnosis not present

## 2020-06-07 DIAGNOSIS — Z881 Allergy status to other antibiotic agents status: Secondary | ICD-10-CM | POA: Insufficient documentation

## 2020-06-07 DIAGNOSIS — Z20822 Contact with and (suspected) exposure to covid-19: Secondary | ICD-10-CM | POA: Insufficient documentation

## 2020-06-07 DIAGNOSIS — Z87891 Personal history of nicotine dependence: Secondary | ICD-10-CM | POA: Diagnosis not present

## 2020-06-07 DIAGNOSIS — R110 Nausea: Secondary | ICD-10-CM | POA: Diagnosis not present

## 2020-06-07 LAB — POC INFLUENZA A AND B ANTIGEN (URGENT CARE ONLY)
Influenza A Ag: NEGATIVE
Influenza B Ag: NEGATIVE

## 2020-06-07 LAB — SARS CORONAVIRUS 2 (TAT 6-24 HRS): SARS Coronavirus 2: NEGATIVE

## 2020-06-07 MED ORDER — ACETAMINOPHEN 325 MG PO TABS
ORAL_TABLET | ORAL | Status: AC
  Filled 2020-06-07: qty 2

## 2020-06-07 MED ORDER — ONDANSETRON 4 MG PO TBDP: 4.0000 mg | ORAL_TABLET | Freq: Three times a day (TID) | ORAL | 0 refills | Status: DC | PRN

## 2020-06-07 MED ORDER — ACETAMINOPHEN 325 MG PO TABS
650.0000 mg | ORAL_TABLET | Freq: Once | ORAL | Status: AC
  Administered 2020-06-07: 650 mg via ORAL

## 2020-06-07 NOTE — ED Provider Notes (Signed)
[facility]    CSN: [id number] Arrival date & time: 06/07/20  1035      History   Chief Complaint Chief Complaint  Patient presents with  . Cough  . Nausea  . Chills  . Headache    HPI Sue Mitchell is a 65 y.o. female.   Presenting today with 1 day history of cough, chills, body aches, sweats, nausea, fever.  She denies vomiting, diarrhea, abdominal pain, congestion, sore throat, chest pain, shortness of breath.  Not taking any medication at this time for symptoms.  No known sick contacts.  History of asthma, allergies and does have home inhalers which she is consistently taking.     Past Medical History:  Diagnosis Date  . Abdominal distension   . Abdominal pain   . Arthritis   . Asthma   . Biliary colic   . COPD (chronic obstructive pulmonary disease) (HCC)   . Diarrhea   . GERD (gastroesophageal reflux disease)   . Hypertension   . Nausea     Patient Active Problem List   Diagnosis Date Noted  . Healthcare maintenance 02/07/2020  . Shoulder pain, left 01/14/2019  . Pulmonary infiltrate 01/14/2019  . Acute respiratory failure with hypoxia (HCC) 04/14/2018  . Hemoptysis 04/11/2018  . Influenza 04/11/2018  . Cough 04/11/2018  . Acute maxillary sinusitis 02/20/2018  . Rhinitis, chronic 12/04/2017  . Cough variant asthma  vs uacs with vcd 12/26/2016    Past Surgical History:  Procedure Laterality Date  . cartilage removal  2007   left knee  . CESAREAN SECTION  1976  . LAPAROSCOPY  1992 (approx)    abdominal    OB History   No obstetric history on file.      Home Medications    Prior to Admission medications   Medication Sig Start Date End Date Taking? Authorizing Provider  ondansetron (ZOFRAN ODT) 4 MG disintegrating tablet Take 1 tablet (4 mg total) by mouth every 8 (eight) hours as needed for nausea or vomiting. 06/07/20  Yes Particia Nearing, PA-C  acyclovir (ZOVIRAX) 200 MG capsule Take 200 mg by mouth 2 (two) times  daily as needed (outbreaks).  01/22/11   [provider]  albuterol (VENTOLIN HFA) 108 (90 Base) MCG/ACT inhaler INHALE 2 PUFFS INTO THE LUNGS EVERY 6 HOURS AS NEEDED FOR WHEEZING OR SHORTNESS OF BREATH 04/24/20   Nyoka Cowden, MD  amLODipine (NORVASC) 5 MG tablet Take 1 tablet (5 mg total) by mouth daily. 04/15/18   Rai, Ripudeep K, MD  DULERA 100-5 MCG/ACT AERO Inhale 2 puffs into the lungs 2 (two) times daily. 12/13/19   [provider]  gabapentin (NEURONTIN) 100 MG capsule Take 1 capsule (100 mg total) by mouth 3 (three) times daily. One three times daily 03/16/20   Nyoka Cowden, MD  hydrochlorothiazide (MICROZIDE) 12.5 MG capsule Take 1 capsule (12.5 mg total) by mouth daily. 04/15/18   Rai, Delene Ruffini, MD  RABEprazole (ACIPHEX) 20 MG tablet Take 30- 60 min before your first and last meals of the day 03/01/20   Nyoka Cowden, MD  valsartan (DIOVAN) 40 MG tablet Take 1 tablet by mouth daily. 08/19/18   [provider]    Family History Family History  Problem Relation Age of Onset  . Diabetes Father     Social History Social History   Tobacco Use  . Smoking status: Former Smoker    Packs/day: 0.50    Years: 25.00    Pack years:  12.50    Types: Cigarettes    Quit date: 2011    Years since quitting: 11.2  . Smokeless tobacco: Never Used  Vaping Use  . Vaping Use: Never used  Substance Use Topics  . Alcohol use: Yes    Comment: occasional 2 per month  . Drug use: No     Allergies   Codeine and Ciprofloxacin   Review of Systems Review of Systems Per HPI Physical Exam Triage Vital Signs ED Triage Vitals  Enc Vitals Group     BP 06/07/20 1104 140/70     Pulse Rate 06/07/20 1104 98     Resp 06/07/20 1104 20     Temp 06/07/20 1104 (!) 102.3 F (39.1 C)     Temp src --      SpO2 06/07/20 1104 97 %     Weight --      Height --      Head Circumference --      Peak Flow --      Pain Score 06/07/20 1103 9     Pain Loc --      Pain Edu?  --      Excl. in GC? --    No data found.  Updated Vital Signs BP 140/70   Pulse 98   Temp (!) 102.3 F (39.1 C)   Resp 20   SpO2 97%   Visual Acuity Right Eye Distance:   Left Eye Distance:   Bilateral Distance:    Right Eye Near:   Left Eye Near:    Bilateral Near:     Physical Exam Vitals and nursing note reviewed.  Constitutional:      Appearance: Normal appearance. She is not ill-appearing.  HENT:     Head: Atraumatic.     Right Ear: Tympanic membrane normal.     Left Ear: Tympanic membrane normal.     Nose: Rhinorrhea present.     Mouth/Throat:     Mouth: Mucous membranes are moist.     Pharynx: Oropharynx is clear.  Eyes:     Extraocular Movements: Extraocular movements intact.     Conjunctiva/sclera: Conjunctivae normal.  Cardiovascular:     Rate and Rhythm: Normal rate and regular rhythm.     Heart sounds: Normal heart sounds.  Pulmonary:     Effort: Pulmonary effort is normal. No respiratory distress.     Breath sounds: Normal breath sounds. No wheezing or rales.  Abdominal:     General: Bowel sounds are normal. There is no distension.     Palpations: Abdomen is soft.     Tenderness: There is no abdominal tenderness. There is no right CVA tenderness, left CVA tenderness or guarding.  Musculoskeletal:        General: Normal range of motion.     Cervical back: Normal range of motion and neck supple.  Skin:    General: Skin is warm and dry.  Neurological:     Mental Status: She is alert and oriented to person, place, and time.  Psychiatric:        Mood and Affect: Mood normal.        Thought Content: Thought content normal.        Judgment: Judgment normal.      UC Treatments / Results  Labs (all labs ordered are listed, but only abnormal results are displayed) Labs Reviewed  SARS CORONAVIRUS 2 (TAT 6-24 HRS)  POC INFLUENZA A AND B ANTIGEN (URGENT CARE ONLY)    EKG  Radiology No results found.  Procedures Procedures (including  critical care time)  Medications Ordered in UC Medications  acetaminophen (TYLENOL) tablet 650 mg (650 mg Oral Given 06/07/20 1117)    Initial Impression / Assessment and Plan / UC Course  I have reviewed the triage vital signs and the nursing notes.  Pertinent labs & imaging results that were available during my care of the patient were reviewed by me and considered in my medical decision making (see chart for details).     Febrile today in triage, Tylenol administered at this time.  Otherwise vital signs stable and reassuring, exam also reassuring.  Rapid flu test negative in clinic, Covid PCR pending.  Isolation reviewed, over-the-counter fever reducers, Zofran given for as needed nausea to encourage p.o.  Return for acutely worsening symptoms.  Final Clinical Impressions(s) / UC Diagnoses   Final diagnoses:  Nausea  Fever, unspecified  Generalized body aches   Discharge Instructions   None    ED Prescriptions    Medication Sig Dispense Auth. Provider   ondansetron (ZOFRAN ODT) 4 MG disintegrating tablet Take 1 tablet (4 mg total) by mouth every 8 (eight) hours as needed for nausea or vomiting. 20 tablet Particia Nearing, [place]     PDMP not reviewed this encounter.   Particia Nearing, [place] 06/07/20 1717

## 2020-06-07 NOTE — ED Triage Notes (Signed)
Pt in with c/o cough, chills and nausea that started yesterday  Pt has not taken medication for sxs

## 2020-06-09 ENCOUNTER — Other Ambulatory Visit: Payer: Self-pay

## 2020-06-09 ENCOUNTER — Ambulatory Visit (HOSPITAL_COMMUNITY)
Admission: EM | Admit: 2020-06-09 | Discharge: 2020-06-09 | Disposition: A | Discharge status: Home / Self Care | Payer: BC Managed Care – PPO | Attending: Physician Assistant | Admitting: Physician Assistant

## 2020-06-09 ENCOUNTER — Encounter (HOSPITAL_COMMUNITY): Payer: Self-pay

## 2020-06-09 DIAGNOSIS — R6889 Other general symptoms and signs: Secondary | ICD-10-CM

## 2020-06-09 LAB — POCT URINALYSIS DIPSTICK, ED / UC
Glucose, UA: NEGATIVE mg/dL
Leukocytes,Ua: NEGATIVE
Nitrite: NEGATIVE
Protein, ur: 30 mg/dL — AB
Specific Gravity, Urine: 1.02 (ref 1.005–1.030)
Urobilinogen, UA: 0.2 mg/dL (ref 0.0–1.0)
pH: 5.5 (ref 5.0–8.0)

## 2020-06-09 NOTE — Discharge Instructions (Addendum)
Push fluids Take tylenol or Excedrin for headache.

## 2020-06-09 NOTE — ED Triage Notes (Signed)
Pt presents with ongoing generalized body aches, headache, nausea, and dizziness for past few days.  Pt had negative covid & flu swab 2 days ago.

## 2020-06-09 NOTE — ED Provider Notes (Signed)
[facility]    CSN: [id number] Arrival date & time: 06/09/20  5681      History   Chief Complaint Chief Complaint  Patient presents with  . Headache  . Dizziness  . Nausea  . Generalized Body Aches    HPI Sue Mitchell is a 65 y.o. female.   Pt complains of continued headache, body aches, and nausea that started about 5 days ago.  Seen in clinic for same sx two days ago.  Negative COVID/flu. She denies vomiting.  Decreased appetite, but drinking some fluids.  She is taking zofran which was prescribed at last visit with some relief. She reports fever yesterday of 100.1. Denies urinary sx.  Pt reports dizziness with position change.       Past Medical History:  Diagnosis Date  . Abdominal distension   . Abdominal pain   . Arthritis   . Asthma   . Biliary colic   . COPD (chronic obstructive pulmonary disease) (HCC)   . Diarrhea   . GERD (gastroesophageal reflux disease)   . Hypertension   . Nausea     Patient Active Problem List   Diagnosis Date Noted  . Healthcare maintenance 02/07/2020  . Shoulder pain, left 01/14/2019  . Pulmonary infiltrate 01/14/2019  . Acute respiratory failure with hypoxia (HCC) 04/14/2018  . Hemoptysis 04/11/2018  . Influenza 04/11/2018  . Cough 04/11/2018  . Acute maxillary sinusitis 02/20/2018  . Rhinitis, chronic 12/04/2017  . Cough variant asthma  vs uacs with vcd 12/26/2016    Past Surgical History:  Procedure Laterality Date  . cartilage removal  2007   left knee  . CESAREAN SECTION  1976  . LAPAROSCOPY  1992 (approx)    abdominal    OB History   No obstetric history on file.      Home Medications    Prior to Admission medications   Medication Sig Start Date End Date Taking? Authorizing Provider  acyclovir (ZOVIRAX) 200 MG capsule Take 200 mg by mouth 2 (two) times daily as needed (outbreaks).  01/22/11   [provider]  albuterol (VENTOLIN HFA) 108 (90 Base) MCG/ACT inhaler INHALE 2 PUFFS  INTO THE LUNGS EVERY 6 HOURS AS NEEDED FOR WHEEZING OR SHORTNESS OF BREATH 04/24/20   Nyoka Cowden, MD  amLODipine (NORVASC) 5 MG tablet Take 1 tablet (5 mg total) by mouth daily. 04/15/18   Rai, Ripudeep K, MD  DULERA 100-5 MCG/ACT AERO Inhale 2 puffs into the lungs 2 (two) times daily. 12/13/19   [provider]  gabapentin (NEURONTIN) 100 MG capsule Take 1 capsule (100 mg total) by mouth 3 (three) times daily. One three times daily 03/16/20   Nyoka Cowden, MD  hydrochlorothiazide (MICROZIDE) 12.5 MG capsule Take 1 capsule (12.5 mg total) by mouth daily. 04/15/18   Rai, Delene Ruffini, MD  ondansetron (ZOFRAN ODT) 4 MG disintegrating tablet Take 1 tablet (4 mg total) by mouth every 8 (eight) hours as needed for nausea or vomiting. 06/07/20   Particia Nearing, PA-C  RABEprazole (ACIPHEX) 20 MG tablet Take 30- 60 min before your first and last meals of the day 03/01/20   Nyoka Cowden, MD  valsartan (DIOVAN) 40 MG tablet Take 1 tablet by mouth daily. 08/19/18   [provider]    Family History Family History  Problem Relation Age of Onset  . Diabetes Father     Social History Social History   Tobacco Use  . Smoking status: Former Smoker  Packs/day: 0.50    Years: 25.00    Pack years: 12.50    Types: Cigarettes    Quit date: 2011    Years since quitting: 11.2  . Smokeless tobacco: Never Used  Vaping Use  . Vaping Use: Never used  Substance Use Topics  . Alcohol use: Yes    Comment: occasional 2 per month  . Drug use: No     Allergies   Codeine and Ciprofloxacin   Review of Systems Review of Systems  Constitutional: Positive for appetite change and fever. Negative for chills.  HENT: Negative for ear pain and sore throat.   Eyes: Negative for pain and visual disturbance.  Respiratory: Negative for cough and shortness of breath.   Cardiovascular: Negative for chest pain and palpitations.  Gastrointestinal: Positive for nausea. Negative for abdominal  pain, diarrhea and vomiting.  Genitourinary: Negative for dysuria and hematuria.  Musculoskeletal: Negative for arthralgias and back pain.  Skin: Negative for color change and rash.  Neurological: Positive for headaches. Negative for seizures and syncope.  All other systems reviewed and are negative.    Physical Exam Triage Vital Signs ED Triage Vitals  Enc Vitals Group     BP 06/09/20 1036 (!) 141/72     Pulse Rate 06/09/20 1036 75     Resp 06/09/20 1036 17     Temp 06/09/20 1036 98 F (36.7 C)     Temp Source 06/09/20 1036 Oral     SpO2 06/09/20 1036 99 %     Weight --      Height --      Head Circumference --      Peak Flow --      Pain Score 06/09/20 1035 9     Pain Loc --      Pain Edu? --      Excl. in GC? --    No data found.  Updated Vital Signs BP (!) 141/72 (BP Location: Right Arm)   Pulse 75   Temp 98 F (36.7 C) (Oral)   Resp 17   SpO2 99%   Visual Acuity Right Eye Distance:   Left Eye Distance:   Bilateral Distance:    Right Eye Near:   Left Eye Near:    Bilateral Near:     Physical Exam Vitals and nursing note reviewed.  Constitutional:      General: She is not in acute distress.    Appearance: She is well-developed.  HENT:     Head: Normocephalic and atraumatic.  Eyes:     Conjunctiva/sclera: Conjunctivae normal.  Cardiovascular:     Rate and Rhythm: Normal rate and regular rhythm.     Heart sounds: No murmur heard.   Pulmonary:     Effort: Pulmonary effort is normal. No respiratory distress.     Breath sounds: Normal breath sounds.  Abdominal:     Palpations: Abdomen is soft.     Tenderness: There is no abdominal tenderness.  Musculoskeletal:     Cervical back: Neck supple.  Skin:    General: Skin is warm and dry.  Neurological:     Mental Status: She is alert.      UC Treatments / Results  Labs (all labs ordered are listed, but only abnormal results are displayed) Labs Reviewed - No data to  display  EKG   Radiology No results found.  Procedures Procedures (including critical care time)  Medications Ordered in UC Medications - No data to display  Initial Impression / Assessment  and Plan / UC Course  I have reviewed the triage vital signs and the nursing notes.  Pertinent labs & imaging results that were available during my care of the patient were reviewed by me and considered in my medical decision making (see chart for details).     Vitals wnl in clinic today.  Negative for COVID/flu.  Advised pt to increase fluid intake.  Continue with zofran as needed.  She can take tylenol as needed for headache and body aches.  Return precautions discussed.  Final Clinical Impressions(s) / UC Diagnoses   Final diagnoses:  None   Discharge Instructions   None    ED Prescriptions    None     PDMP not reviewed this encounter.   Jodell Cipro, PA-C 06/09/20 1107

## 2020-06-20 DIAGNOSIS — E860 Dehydration: Secondary | ICD-10-CM | POA: Diagnosis not present

## 2020-06-20 DIAGNOSIS — F339 Major depressive disorder, recurrent, unspecified: Secondary | ICD-10-CM | POA: Diagnosis not present

## 2020-06-20 DIAGNOSIS — H8309 Labyrinthitis, unspecified ear: Secondary | ICD-10-CM | POA: Diagnosis not present

## 2020-06-20 DIAGNOSIS — A084 Viral intestinal infection, unspecified: Secondary | ICD-10-CM | POA: Diagnosis not present

## 2020-07-07 DIAGNOSIS — H8309 Labyrinthitis, unspecified ear: Secondary | ICD-10-CM | POA: Diagnosis not present

## 2020-07-07 DIAGNOSIS — M130 Polyarthritis, unspecified: Secondary | ICD-10-CM | POA: Diagnosis not present

## 2020-07-07 DIAGNOSIS — G4730 Sleep apnea, unspecified: Secondary | ICD-10-CM | POA: Diagnosis not present

## 2020-07-07 DIAGNOSIS — I10 Essential (primary) hypertension: Secondary | ICD-10-CM | POA: Diagnosis not present

## 2020-07-28 ENCOUNTER — Ambulatory Visit: Payer: BC Managed Care – PPO | Admitting: Primary Care

## 2020-07-28 ENCOUNTER — Ambulatory Visit: Payer: BC Managed Care – PPO | Admitting: Internal Medicine

## 2020-08-21 ENCOUNTER — Other Ambulatory Visit: Payer: Self-pay | Admitting: Internal Medicine

## 2020-09-25 ENCOUNTER — Telehealth: Payer: Self-pay | Admitting: Internal Medicine

## 2020-09-25 NOTE — Telephone Encounter (Signed)
Spoke with the pt  She is needing copy of dec 2021 cxr report for her employer  I printed and placed up front  Nothing further needed

## 2020-09-27 ENCOUNTER — Encounter: Payer: Self-pay | Admitting: Internal Medicine

## 2020-09-27 ENCOUNTER — Other Ambulatory Visit: Payer: Self-pay

## 2020-09-27 ENCOUNTER — Ambulatory Visit (INDEPENDENT_AMBULATORY_CARE_PROVIDER_SITE_OTHER): Payer: Medicare Other | Admitting: Internal Medicine

## 2020-09-27 DIAGNOSIS — J45991 Cough variant asthma: Secondary | ICD-10-CM | POA: Diagnosis not present

## 2020-09-27 DIAGNOSIS — R0789 Other chest pain: Secondary | ICD-10-CM | POA: Diagnosis not present

## 2020-09-27 NOTE — Patient Instructions (Signed)
Stop baking soda  Pepcid (famotidine) 20 mg after bfast and after supper until you start back on aciphex Take 30- 60 min before your first and last meals of the day   Please schedule a follow up visit in 6 months but call sooner if needed  (either the [place] or [place] office)

## 2020-09-27 NOTE — Assessment & Plan Note (Signed)
Recurred 08/2020 while on baking soda when could not afford PPI  - rec rx citrcucel, stop baking soda, rx PPI/ pepcid   F/u GI if not improving over the next several weeks as no evidence this is related to breathing or cardiaac issues         Each maintenance medication was reviewed in detail including emphasizing most importantly the difference between maintenance and prns and under what circumstances the prns are to be triggered using an action plan format where appropriate.  Total time for H and P, chart review, counseling, reviewing hfa  device(s) and generating customized AVS unique to this office visit / same day charting = 30 min

## 2020-09-27 NOTE — Assessment & Plan Note (Signed)
Quit smoking 2011  Recurrent cough since teenager really bad and daily since Aug 2018 12/26/2016  rec symbicort 80 2bid then return for full pfts - PFT's  02/06/2017  FEV1 1.37 (80 % ) ratio 67  p 5 % improvement from saba p symb 80  prior to study with DLCO  66/66c % corrects to 79  % for alv volume   - Spirometry 10/21/2017  FEV1 1.18 (70%)  Ratio 71 with min curvature during attack of cough/ mostly pseudowheeze on exam on symb 80 x2  - FENO 10/21/2017  =   7  On symb 80 x 2  - 11/04/2017     90% with spacer  - singulair added 11/04/2017  But not taking consistently as of 12/04/2017 > rec restart daily  X one week and stopped it due "dizzy"  - PFT's  12/04/2017  FEV1 1.34 (85 % ) ratio 67  p 20 % improvement from saba p nothing prior to study with DLCO  71 % corrects to 80  % for alv volume   - Allergy profile 12/04/2017 >  Eos 0.0 /  IgE  546 dust only  - cyclical cough rx 6/50/3546  - Spirometry 03/26/2018  FEV1 1.1 (73%)  Ratio 0.64 - 03/26/2018  After extensive coaching inhaler device,  effectiveness =    90% s spacer  - Gabapentin 100 mg tid trial 03/26/2018> attempted rx by non-adherent  - Sinus CT 04/07/2018 > Negative study.  No evidence of paranasal sinusitis. - Spirometry 04/08/2018  FEV1 0.7 (47%)  Ratio 0.52 with concave curvature p am symb 80 x 2 and neb in office   - 04/08/2018  After extensive coaching inhaler device,  effectiveness =    75% with smi > added trial of spiriva 2pffs daily x 2 weeks then return  - CTa 04/11/18 for hemoptysis with refractory cough > Limited study due to respiratory motion. No obvious pulmonary embolism or other acute findings. - 04/20/2018  After extensive coaching inhaler device,  effectiveness =    0% with spiriva smi which caused immediate severe cough so rec d/c spiriva  - 04/20/2018 rechallenge with gabapentin 100 mg bid  - 05/04/2018  After extensive coaching inhaler device,  effectiveness =    90% with spacer - s spacer starts coughing immediately  -  Spirometry 05/04/2018  FEV1 1.2 (75%)  Ratio 0.65 with mild curvature p symb 80 x 2 puffs   - 05/04/2018 increased gabapentin to 100 tid   - 05/25/2018 increased gabapentin to  100 mg qid > improved  - 07/23/2018 try ppi just in am  07/23/2018  After extensive coaching inhaler device,  effectiveness =    90% with spacer from baseline 75%  - 09/01/2018  changed to dulera 100 due to cost   - 1/26/2021changed to dulera 200 2bid   - 03/16/2020 rec try back on dulera 100 2bid due to refractory cough  > resolved   All goals of chronic asthma control met including optimal function and elimination of symptoms with minimal need for rescue therapy.  Contingencies discussed in full including contacting this office immediately if not controlling the symptoms using the rule of two's.

## 2020-09-27 NOTE — Progress Notes (Signed)
Subjective:    Patient ID: Sue Mitchell, female   DOB: 08/23/1955     MRN: [id number]   Brief patient profile:  64 yobf  RT quit smoking 2011 @ wt 140  with h/o sinus symptoms assoc with cough and need for saba prn as teenager while living in [place] but after arrival here at age late 15's it changed to point where bothered her mostly with onset of cold weather typically req ov rx  abx and prednisone help a lot and this pattern continued even after quit smoking to where the episodes last longer and onset of this not directed linked to cold weather in fact  had one April 2018 never  Completely cleared  then flared again in Oct 01 2016 severe dry cough / eval by allergist /ent since onset neg w/u so referred by Dr Sue Mitchell to pulmonary clinic 12/26/16     History of Present Illness  12/26/2016 1st [name]   Chief Complaint  Patient presents with   Pulm Consult    Pt referred by Dr. Billy Mitchell ENT. Pt has productive cough-clear mucus sometimes yellow. Pt had horseness in voice for 6-8 weeks, no chills or fever, have some chest pain and tightness with cough.  recurrent cough since April 2018 never resolved p rx as uri then flaired early August 2018 rx zpak/pred/saba> some better  Cough was worse at hs now   Am feels wheezy not really coughing much up but what she does is white and < 1 tbsp Cough seems worse with exp to cold air at work  Assoc overt hb better on zantac  Some gen ant chest discomfort with severe coughing fits  rec Plan A = Automatic =  symbicort 80 Take 2 puffs first thing in am and then another 2 puffs about 12 hours later.  Plan B = Backup Only use your albuterol as a rescue medication  Start zantac 150- 300 mg after bfast and after supper until cough better  GERD diet    02/06/2017  f/u ov/Sue Mitchell re:  GOLD I copd/ cough on cold air exp  Chief Complaint  Patient presents with   Follow-up    Cough had improved but then worsened again  with colder weather. She is using her albuterol inhaler 2 x per wk on average.   not limited by doe / steps ok except for knees  Some gerd at hs taking h2 am only  rec Pantoprazole (protonix) 40 mg  Take  30-60 min before first meal of the day and Zantac  300 mg bedtime until return to office - this is the best way to tell whether stomach acid is contributing to your problem.   GERD diet  Please schedule a follow up office visit in 6 weeks, call sooner if needed     10/21/2017  f/u ov/Sue Mitchell re: acute refractory cough / saw allergist cannot name "all neg"  ? Sue Mitchell?  Chief Complaint  Patient presents with   Follow-up    Last seen by Sue Mitchell 02/06/17. Patient states she has had a productive cough, post nasal drip, and constantly using her voice. At night, she becomes so congested that she has to breathe through her mouth instead of nose.    from last ov until 09/25/17 fine on on ranitidine one bid/symb 80 2bid Never took protonix  Was still needing saba twice a week at baseline and a lot more while working at [employer]  Then abruptly  worse Aug 1  lots sinus drainage clear > zpak early in Aug by Sue Mitchell some better for a week or two then worse says ran out of symbicort one day prior to ov / using lots of saba now and severe nasal congestion as well as hoarseness and can't sleep due to cough rec Plan A = Automatic = symbiocort 80 Take 2 puffs first thing in am and then another 2 puffs about 12 hours later.  Plan B = Backup Only use your albuterol as a rescue medication  Prednisone 10 mg take  4 each am x 2 days,   2 each am x 2 days,  1 each am x 2 days and stop  Pantoprazole (protonix) 40 mg   Take  30-60 min before first meal of the day and ranitidine 300 mg each evening GERD diet      11/04/2017  f/u ov/Sue Mitchell re: atypical asthma in RT  Chief Complaint  Patient presents with   Follow-up    Pt c/o sneezing, wheezing and cough with clear sputum- started after she mowed her lawn 1 day ago. She  has had to use her albuterol inhaler.   wakes up feeling good most am's s cough/ wheeze about 30 min later takes first 2 pffs of symb 80 around 5 15 am  Then next 2 pffs after 9 pm  Had been doing great prior mowing grass one day prior to OV Rarely need saba since last flare    Not limited by breathing from desired activities   rec Continue symbicort 80 Take 2 puffs first thing in am and then another 2 puffs about 12 hours later thru spacer Add singulair 10 mg each pm    12/04/2017  f/u ov/Sue Mitchell re:   symb 80 2bid/ singualir sometimes  Chief Complaint  Patient presents with   Follow-up    PFT today, continues to have cough with hoarseness. She uses her albuterol inhaler 1 x per wk on average.   Dyspnea:  fine Cough: p grass exp / chemicals/ cologne dry Sleeping: flat/ one pillow no noct symptms SABA use: rare 02: none   Flare of cough / drainage 12/01/17 > has not tried otc's Plan A = Automatic = symbicort 80 Take 2 puffs first thing in am and then another 2 puffs about 12 hours later and remember to breath it out through your nose  singulair  10 mg every evening  Plan B = Backup Only use your albuterol as a rescue medication  For nasal symptoms > zyrtec 10 mg on at bedtime as needed > too sleepy    02/20/18 NP ov rec Augmentin/ pred/ antihistamine/singulair > no better and could not take singulair / did not try zyrtec in am "makes me sleepy"     03/11/2018 extended  f/u ov/Sue Mitchell re: refractory  Cough since aug 2018    Chief Complaint  Patient presents with   Follow-up    Cough not improving. She is coughing up clear to white sputum. She states her cough is worse when she goes to work and when she first lies down at night. She is using her albuterol inhaler once daily on average.   Dyspnea:  Not limited by breathing from desired activities   Cough: worse around 5pm  Then immediately when supine x then some during the night, never took zyrtec at hs as rec with sense of globus and  throat / chest "congestion" but no excess mucus  Sleeping: on flat bed on side  SABA use: ?  if neb helps more than hfa - not really  Sure but "it sure makes me shake" Still using mint products  rec Symbicort 80  Take 2 puffs first thing in am and then another 2 puffs about 12 hours later.  Pantoprazole (protonix) 40 mg   Take  30-60 min before first meal of the day and Pepcid (famotidine)  20 mg one @  bedtime until return to office - this is the best way to tell whether stomach acid is contributing to your problem.   GERD diet  Try zyrtec 10 mg after supper  Take delsym two tsp every 12 hours and supplement if needed with  vicodin up to 1every 4 hours to suppress the urge to cough. Swallowing water and/or using ice chips/non mint and menthol containing candies (such as lifesavers or sugarless jolly ranchers) are also effective.  You should rest your voice and avoid activities that you know make you cough. Once you have eliminated the cough for 3 straight days try reducing the vicodin first,  then the delsym as tolerated.   On 03/14/18 Prednisone 10 mg take  4 each am x 2 days,   2 each am x 2 days,  1 each am x 2 days and stop  Please schedule a follow up office visit in 2 weeks, sooner if needed  with all medications /inhalers/ solutions in hand so we can verify exactly what you are taking. This includes all medications from all doctors and over the counters    03/26/2018  f/u ov/Sue Mitchell re: refractory cough/ wheeze on symb 80 2bid  Chief Complaint  Patient presents with   Follow-up    Pt c/o sinus congestion, cough with yellow sputum and wheezing. She is using her albuterol inhaler once per wk on average.   Dyspnea:  Ok unless cough Cough:  at work and at home same severity/ frequency  Sleeping: not coughing while sleeping  SABA use:  Not really helping 02: none  gen ant chest soreness from coughing/ not able to use vicodin daytime to suppress cough rec  Only use your albuterol as a rescue  medication  Please see patient coordinator before you leave today  to schedule sinus CT Please remember to go to the  x-ray department  for your tests - we will call you with the results when they are available  Please schedule a follow up office visit in 2 weeks, sooner if needed  with all medications /inhalers/ solutions in hand so we can verify exactly what you are taking. This includes all medications from all doctors and over the counters       04/08/2018  f/u ov/Sue Mitchell re: refractory cough / brought just her inhalers to [facility]   Chief Complaint  Patient presents with   Acute Visit    Pt has had complaints of sinius drainage and a cough x4 days and also states she has had problems sleeping. Pt also has been hurting everywhere, feeling weak, and has had sweats.  Dyspnea: usually only sob when coughing Cough: white mucus / min production "but feels like it's choking her"  Sleeping: on side bed pillows too heavy for bed blocks/ worse cough now at hs / not using cough suppression as advised  SABA use: rarely / symbicort count on 50 so missed about 14 doses or continued to use beyond the red line on the prior/ warned about both 02: none Reported after much back and forth "felt good only while on prednisone" and gradually worse  off it  - turns out "felt good" does not mean the cough was gone but breathing was better. rec Prednisone 10mg   Take 4 for three days 3 for three days 2 for three days 1 for three days and stop Plan A = Automatic = Symbicort 80 Take 2 puffs first thing in am and then another 2 puffs about 12 hours later and spiriva 2 puff each am  Plan B = Backup Only use your albuterol inhaler as a rescue medication   Try gabapentin 100 mg twice daily bfast and supper and if can't take it twice daily just take at bedtime  For drainage / throat tickle try take CHLORPHENIRAMINE  4 mg (chlortabs  [facility])   Stay on protonix Take 30- 60 min before your first and last meals of the day   Take delsym two tsp every 12 hours and supplement if needed with  vicodine  up to 2 every 4 hours to suppress the urge to cough. .  Please schedule a follow up office visit in 2 weeks, sooner if needed  with all medications /inhalers/ solutions in hand so we can verify exactly what you are taking. This includes all medications from all doctors and over the counters - needs spirometry on return     Admit date: 04/14/2018 Discharge date: 04/18/2018    Equipment/Devices: Nebulizer with albuterol sol'n, 2L oxygen ordered several days prior to discharge. Apparently patient left stating she would get the nebulizer and oxygen from [facility] and did not want to wait for it to be delivered to the room per care management. Per CM note, this will be delivered to the home.    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 At the time of discharge she reportedly maintained sufficient oxygenation during ambulation, but in the ED she was found to be hypoxic, wheezing, coughing, and weak, and was subsequently readmitted. With continued treatment including IV steroids she's subjectively significantly improved. She was weaned from oxygen while at rest and still desaturates on ambulation. She is discharged with DME in stable condition with follow up in 48 hours.   Discharge Diagnoses:  Principal Problem:   Acute respiratory failure with hypoxia (HCC)   Cough variant asthma  vs uacs wit vcd   Influenza   Cough   Acute hypoxic respiratory failure due to influenza A and asthma exacerbation:  - Completed 5 days of tamiflu - Continue symbicort, spiriva respimat - Scheduled and prn albuterol nebulizer (provided at DC) - Wheezing has improved so converted to po steroid with improvement, has prednisone at home, ordered to  take 40mg  po daily until follow up.  - Continue supplemental oxygen to maintain SpO2 >90%. Despite significant improvement, does still need O2 at discharge. Will anticipate continued improvement and liberation from oxygen, will need recheck at follow up office visit with pulmonology the coming week. - Continue antitussive. PMPAware queried, has gotten hydrocodone-APAP from Dr. 3/18 2/12 and cough syrup intermittently from PCP.    Hemoptysis: CTA chest motion-degraded at last admission, no PE.  - Resolved.    Hypertension:  - Continue norvasc and HCTZ since recently stopping ARB for concern of exacerbating cough.    GERD:  - Continue PPI    04/20/2018  Extended f/u ov/Sue Mitchell re: post hosp f/u  Chief Complaint  Patient presents with  Follow-up    Breathing has improved. She is not using her albuterol inhaler but is using albuterol neb about 3 x per day.   Dyspnea:  MMRC2 = can't walk a nl pace on a flat grade s sob but does fine slow and flat  Cough: gone/ min urge to clear throat but now on tussionex Sleeping: side / pillows to prop up  SABA use: way over using as above  02: none at all  rec Continue pantoprazole 40 mg Take 30- 60 min before your first and last meals of the day  Gabapentin  Restart 100 mg twice daily bfast and bedtime Plan A = Automatic = Symbicort 80 Take 2 puffs first thing in am and then another 2 puffs about 12 hours later.  Work on inhaler technique:   Plan B = Backup Only use your albuterol inhaler as a rescue medication  Plan C = Crisis - only use your albuterol nebulizer if you first try Plan B and it fails to help > ok to use the nebulizer up to every 4 hours but if start needing it regularly call for immediate appointment Finish the prednisone and stop spiriva F/u in 2 weeks with meds in hand (#35 on present symb 80 and one sample given) Add:  Consider performist/bud neb if can't wean off pred/saba neb and hfa makes her cough worse due to effects on upper  airway    05/04/2018  f/u ov/Sue Mitchell re:   symbicort 80 one full at home and total of 45 puffs samples/ 32 gabapenitn  Chief Complaint  Patient presents with   Acute Visit    Cough has improved but has not resolved. She is coughing up min, clear sputum. She has not had to use her albuterol inhaler.   Dyspnea:  MMRC1 = can walk nl pace, flat grade, can't hurry or go uphills or steps s sob  Cough: p exertion Sleeping: fine 30 degrees on pillows  SABA use: no saba  02: no rec Increase gabapentin to 100 mg three times a day  ok to return to work 05/16/2018  Please schedule a follow up office visit in 3 weeks, sooner if needed  with all medications /inhalers/ solutions in hand so we can verify exactly what you are taking. This includes all medications from all doctors and over the counters        History of Present Illness: 05/25/2018   televist/Sue Mitchell re: cough/ sob symbicort 80 2bid /still on samples "half full"  Dyspnea:  Walked for 5 minutes some inclines  Cough: some daytime / dry assoc with hoarseness  Sleeping: flat bed/ 3 pillows  SABA use: none   rec For drainage / throat tickle try take CHLORPHENIRAMINE  4 mg (chlortab 4 mg) - take one every 4 hours as needed - available over the counter- may cause drowsiness so start with just a bedtime dose or two and see how you tolerate it before trying in daytime   Increase gabapentin to 100 mg four times daily  Continue Symbicort 80 Take 2 puffs first thing in am and then another 2 puffs about 12 hours later (use coupon and let me know if they won't honor it  I will take you out of work until end of April 2020       03/01/2020  f/u ov/Sue Mitchell re: asthma/uacs doing poorly since Oct 2021 maint  On dulera 100 / gabapentin 100 mg qid /just finished prednisone over xmas /  Only using ppi qd and  not taking gabapentin 100 qid during flare of cough/ wheeze/ sob instead relying more and more on saba including neb  Chief Complaint  Patient presents with    Acute Visit    Increased SOB, wheezing and cough for the past month. She is using her neb every am before [facility] and sometimes using it before bed. She is using her albuterol inhaler once daily on average.   Dyspnea:  Does fine unless misses dose of albuterol  Cough: severe / min clear mucus Sleeping: bed is flat / pillows - no bed blocks as rec  SABA use: way too much  02: none  Only on one ppi daily due to bloating rec Stop pantoprazole and start aciphex 20 mg Take 30- 60 min before your first and last meals of the day  Gabapentin 100 mg four times daily consisently   For cough /congestion  > mucinex dm 1200 mg every 12 hours and use flutter valve as much as possible  No change the other medications for now No work x next 2 weeks due to poorly controlled cough/wheeze/ congestion Please schedule a follow up office visit in 2 weeks, sooner if needed  with all medications /inhalers/ solutions in hand so we can verify exactly what you are taking. This includes all medications from all doctors and over the counters   03/16/2020  f/u ov/Sue Mitchell re:  Recurrent cough x 30's assoc with voice loss Chief Complaint  Patient presents with   Follow-up    Cough-clear and white, wheezing in am, sob same  Dyspnea:  Can't do hills at home due to L knee  Cough: sporadic / tickle in throat daytime / minimal white mucus/ can't use higher dose of gabapentin at work though hasn't been working due to cough  Sleeping: on side/ pillows no  bed blocks yet  SABA use: twice  A week 02: none avg gabapentin   = 100 mg  4 per day  Rec Treatment consists of avoiding foods that cause gas (especially boiled eggs, mexcican food but especially  beans and undercooked vegetables like  spinach and some salads)  and citrucel 1 heaping tsp twice daily with a large glass of water  Avoid broccoli and cabbage for now Push gabapentin as high as  300 mg 4x daily if you still feel like something stuck in your throat for at least a  week  No work x 2 weeks then return to clinic with all your medications Add consider decreasing dulera to 100 2 bid    09/27/2020  f/u ov/Sue Mitchell re: cough variant asthma vs vcd no longer on gabapentin / maint dulera 100 2bid  Chief Complaint  Patient presents with   Follow-up    Right side pain in her rib area radiating to her back, nausea x 2 wks. Her breathing is overall doing well and she is not coughing much. She has not needed her rescue inhaler recently.    Dyspnea:  Not limited by breathing from desired activities  /back at work -avoid steps  Cough: none now  Sleeping: bed is flat, sleeps on side, pain better supine  SABA use: none  02: none  Covid status:   vax x 3  Off aciphex x sev week and replaced with baking soda then started having recurrent RUQ abd pain prev ruled out for GB dz and preseumed to have IBS but did not follow recs for rx    No obvious day to day or daytime variability or assoc excess/ purulent  sputum or mucus plugs or hemoptysis or  chest tightness, subjective wheeze or overt sinus or hb symptoms.   Sleeping  without nocturnal  or early am exacerbation  of respiratory  c/o's or need for noct saba. Also denies any obvious fluctuation of symptoms with weather or environmental changes or other aggravating or alleviating factors except as outlined above   No unusual exposure hx or h/o childhood pna/ asthma or knowledge of premature birth.  Current Allergies, Complete Past Medical History, Past Surgical History, Family History, and Social History were reviewed in [employer] record.  ROS  The following are not active complaints unless bolded Hoarseness, sore throat, dysphagia, dental problems, itching, sneezing,  nasal congestion or discharge of excess mucus or purulent secretions, ear ache,   fever, chills, sweats, unintended wt loss/intentional or wt gain, classically pleuritic or exertional cp,  orthopnea pnd or arm/hand swelling  or leg  swelling, presyncope, palpitations, abdominal pain, anorexia, nausea, vomiting, diarrhea  or change in bowel habits or change in bladder habits, change in stools or change in urine, dysuria, hematuria,  rash, arthralgias, visual complaints, headache, numbness, weakness or ataxia or problems with walking or coordination,  change in mood or  memory.        Current Meds  Medication Sig   acyclovir (ZOVIRAX) 200 MG capsule Take 200 mg by mouth 2 (two) times daily as needed (outbreaks).    albuterol (VENTOLIN HFA) 108 (90 Base) MCG/ACT inhaler INHALE 2 PUFFS INTO THE LUNGS EVERY 6 HOURS AS NEEDED FOR WHEEZING OR SHORTNESS OF BREATH   amLODipine (NORVASC) 5 MG tablet Take 1 tablet (5 mg total) by mouth daily.   DULERA 100-5 MCG/ACT AERO Inhale 2 puffs into the lungs 2 (two) times daily.   gabapentin (NEURONTIN) 100 MG capsule Take 1 capsule (100 mg total) by mouth 3 (three) times daily. One three times daily   hydrochlorothiazide (MICROZIDE) 12.5 MG capsule Take 1 capsule (12.5 mg total) by mouth daily.   RABEprazole (ACIPHEX) 20 MG tablet TAKE 1 TABLET 30-60 MINUTES BEFORE 1ST AND LAST MEALS OF THE DAY   valsartan (DIOVAN) 40 MG tablet Take 1 tablet by mouth daily.                           Objective:   Physical Exam    09/27/2020         156  03/16/2020        175 03/01/2020           171 07/19/2019        173  01/13/2019      175  10/21/2018        172  09/01/2018          169 07/23/2018        170  05/04/2018         170  04/20/2018       169  04/08/2018       164  03/26/2018       173  03/11/2018       173  12/04/2017     170  11/04/2017       169  10/21/2017       169  02/06/2017     171   12/26/16 171 lb 6.4 oz (77.7 kg)  11/18/13 170 lb (77.1 kg)  03/21/11 171 lb (77.6 kg)      Vital signs reviewed  09/27/2020  - Note  at rest 02 sats  98% on RA   General appearance:    amb bf nad   HEENT : pt wearing mask not removed for exam due to covid -19 concerns.    NECK :  without  JVD/Nodes/TM/ nl carotid upstrokes bilaterally   LUNGS: no acc muscle use,  Nl contour chest which is clear to A and P bilaterally without cough on insp or exp maneuvers   CV:  RRR  no s3 or murmur or increase in P2, and no edema   ABD:  soft and nontender with nl inspiratory excursion in the supine position. No bruits or organomegaly appreciated, bowel sounds nl  MS:  Nl gait/ ext warm without deformities, calf tenderness, cyanosis or clubbing No obvious joint restrictions   SKIN: warm and dry without lesions    NEURO:  alert, approp, nl sensorium with  no motor or cerebellar deficits apparent.                 Assessment:

## 2020-10-06 ENCOUNTER — Telehealth: Payer: Self-pay | Admitting: Internal Medicine

## 2020-10-06 DIAGNOSIS — I339 Acute and subacute endocarditis, unspecified: Secondary | ICD-10-CM | POA: Diagnosis not present

## 2020-10-06 DIAGNOSIS — J4522 Mild intermittent asthma with status asthmaticus: Secondary | ICD-10-CM | POA: Diagnosis not present

## 2020-10-06 DIAGNOSIS — I10 Essential (primary) hypertension: Secondary | ICD-10-CM | POA: Diagnosis not present

## 2020-10-06 DIAGNOSIS — R7302 Impaired glucose tolerance (oral): Secondary | ICD-10-CM | POA: Diagnosis not present

## 2020-10-06 NOTE — Telephone Encounter (Signed)
Pt stated that she needs a form to be sent to [employer] she stated that the form has to indicate the time that she was out of work. The pt said that she would drop the forms off if needed or we can contact their office. Pls regard; [phone]

## 2020-10-06 NOTE — Telephone Encounter (Signed)
Will route to myself to follow up on on Monday.

## 2020-10-09 NOTE — Telephone Encounter (Signed)
Pt is calling back in regards to paperwork will route to Cherina per last note. Pls regard; [phone]

## 2020-10-09 NOTE — Telephone Encounter (Signed)
Called patient but she did not answer. Left message for her to call us back.   I found the forms up front and they were disability forms from [employer]. Will hold onto forms until hearing back from the patient so I can explain the office policy on disability paperwork.

## 2020-10-10 NOTE — Telephone Encounter (Signed)
Patient called back and I spoke with patient regarding paperwork. It is disability form that patient needs to be pay and sign the paper an then Dr. [name] can sign it and give to Darilyn who does disability paperwork. Patient verbalized understanding and will come by office tomorrow to sign it. Will leave in triage for f/u.

## 2020-10-10 NOTE — Telephone Encounter (Signed)
Pt calling back [phone]

## 2020-10-10 NOTE — Telephone Encounter (Signed)
ATC, left VM. 

## 2020-10-11 ENCOUNTER — Telehealth: Payer: Self-pay | Admitting: Internal Medicine

## 2020-10-11 NOTE — Telephone Encounter (Signed)
Patient came into office and completed her portion of disability form.  I emailed to [facility] to process so Dr. [name] can sign it tomorrow.  Patient stated her employer has said they only need Dr. [name] to fill out page 4 (Physician's Statement).  Fax to [phone] when completed

## 2020-10-12 NOTE — Telephone Encounter (Signed)
Called patient and she said last day at work was 02/27/2020. She saw B. Cherre Huger, NP on 02/07/20.  Dr. [name] signed FMLA papers on 05/15/20.  This form is for her [employer] supplemental policy so she can continue to get disability payments until she is able to find another job.

## 2020-10-12 NOTE — Telephone Encounter (Signed)
Patient came by the office yesterday afternoon to sign paperwork.

## 2020-10-17 NOTE — Telephone Encounter (Signed)
Signed disability form was faxed to [employer] - fax# [phone]

## 2020-11-15 ENCOUNTER — Telehealth: Payer: Self-pay | Admitting: Internal Medicine

## 2020-11-15 MED ORDER — PREDNISONE 10 MG PO TABS: ORAL_TABLET | ORAL | 0 refills | Status: DC

## 2020-11-15 MED ORDER — AZITHROMYCIN 250 MG PO TABS: 250.0000 mg | ORAL_TABLET | ORAL | 0 refills | Status: DC

## 2020-11-15 NOTE — Telephone Encounter (Signed)
Zpak and Prednisone 10 mg take  4 each am x 2 days,   2 each am x 2 days,  1 each am x 2 days and stop

## 2020-11-15 NOTE — Telephone Encounter (Signed)
Spoke with the pt and notified of response per Dr Carmin Richmond. Pt verbalized understanding. Rxs were sent.

## 2020-11-15 NOTE — Telephone Encounter (Signed)
Spoke with the pt  She is c/o increased cough and chest congestion for approx 5 days  Cough is keeping her up at night- prod with clear sputum  She is wheezing some but no increased SOB  Denies HA, sore throat, f/c/s, aches  She is taking her gabapentin tid, aciphex bid ac, symbicort 2 puffs bid and has been using her albuterol inhaler as well  Pt fully vaccinated against covid  Please advise thanks!  Allergies  Allergen Reactions   Codeine Hives, Itching and Other (See Comments)    All over the body, including burning sensation.   Ciprofloxacin Palpitations

## 2020-11-20 ENCOUNTER — Telehealth: Payer: Self-pay | Admitting: Internal Medicine

## 2020-11-20 NOTE — Telephone Encounter (Signed)
Call returned to patient, confirmed DOB. Made aware of MW recommendations. Voiced understanding. Appt made.   Nothing further needed at this time.

## 2020-11-20 NOTE — Telephone Encounter (Signed)
Call returned to patient, confirmed DOB. Patient states she is having increased wheezing and SOB that started last week. Reports MW sent in zpack and prednisone taper and she does not feel better as if yet. She reports a productive cough with clear phlegm. She reports having increased congestion. Denies fever. Denies any active chest pain. She is using Dulera and albuterol as directed. She states she started feeling better for maybe 1-2 days and now she is back feeing bad.   MW please advise. Thanks :)

## 2020-11-20 NOTE — Telephone Encounter (Signed)
Will need ov to sort out, ok to see NP if nothing available here or see me in [place] tomorrow as work in as I get a lot of no shows there

## 2020-11-21 ENCOUNTER — Other Ambulatory Visit: Payer: Self-pay

## 2020-11-21 ENCOUNTER — Encounter: Payer: Self-pay | Admitting: Adult Health

## 2020-11-21 ENCOUNTER — Ambulatory Visit (INDEPENDENT_AMBULATORY_CARE_PROVIDER_SITE_OTHER): Payer: Medicare Other | Admitting: Adult Health

## 2020-11-21 ENCOUNTER — Ambulatory Visit (INDEPENDENT_AMBULATORY_CARE_PROVIDER_SITE_OTHER): Payer: Medicare Other

## 2020-11-21 VITALS — BP 124/70 | HR 75 | Temp 98.2°F | Ht <= 58 in | Wt 155.0 lb

## 2020-11-21 DIAGNOSIS — J45901 Unspecified asthma with (acute) exacerbation: Secondary | ICD-10-CM | POA: Insufficient documentation

## 2020-11-21 DIAGNOSIS — J45991 Cough variant asthma: Secondary | ICD-10-CM

## 2020-11-21 DIAGNOSIS — J4541 Moderate persistent asthma with (acute) exacerbation: Secondary | ICD-10-CM

## 2020-11-21 DIAGNOSIS — M419 Scoliosis, unspecified: Secondary | ICD-10-CM | POA: Diagnosis not present

## 2020-11-21 DIAGNOSIS — I700 Atherosclerosis of aorta: Secondary | ICD-10-CM | POA: Diagnosis not present

## 2020-11-21 DIAGNOSIS — J40 Bronchitis, not specified as acute or chronic: Secondary | ICD-10-CM | POA: Diagnosis not present

## 2020-11-21 MED ORDER — AMOXICILLIN-POT CLAVULANATE 875-125 MG PO TABS: 1.0000 | ORAL_TABLET | Freq: Two times a day (BID) | ORAL | 0 refills | Status: AC

## 2020-11-21 MED ORDER — BENZONATATE 200 MG PO CAPS: 200.0000 mg | ORAL_CAPSULE | Freq: Three times a day (TID) | ORAL | 1 refills | Status: DC | PRN

## 2020-11-21 MED ORDER — PREDNISONE 10 MG PO TABS: ORAL_TABLET | ORAL | 0 refills | Status: DC

## 2020-11-21 NOTE — Assessment & Plan Note (Signed)
Acute asthmatic bronchitic exacerbation slow to resolve.  No significant provement with Z-Pak and prednisone.  Chest x-ray today.  Check COVID-19 testing. Will begin Augmentin and a prednisone taper.  Advised on mucociliary clearance with Mucinex or Robitussin.  Cough control with Tessalon.  Add in chlor tabs for postnasal drainage.  Plan  Patient Instructions  Check Covid 19 test, call back if positive .  Chest xray today .  Augmentin 875mg  Twice daily  for 1 week , take with food  Prednisone taper over next week.  Robitussin DM 2 tsp every 4hr for cough As needed   Tessalon Three times a day  for cough As needed   Dulera 2 puffs Twice daily, rinse after use.  Albuterol inhaler or neb As needed   Chlorpheniramine 4 mg 1 tab At bedtime  As needed  drainage .  Follow up with Dr. in 6 weeks and As needed   Please contact office for sooner follow up if symptoms do not improve or worsen or seek emergency care

## 2020-11-21 NOTE — Progress Notes (Signed)
@Patient  ID: , female    DOB: [birth year], 65 y.o.   MRN: [id number]  Chief Complaint  Patient presents with   Follow-up    Referring provider: [id number], MD  HPI: 65 year old female former smoker followed for COPD and allergic asthma, Chronic cough   TEST/EVENTS :   Spirometry 05/04/2018  FEV1 1.2 (75%)  Ratio 0.65   11/21/2020 Follow up :Asthma  Patient presents for follow-up visit.  She complains of 1 week of cough, congestion, unable to sleep from cough, drainage, wheezing.  Has not tested for Covid. Remains on Dulera tiwce daily .  She works as RT in 11/23/2020 .  She was called in a Z-Pak and prednisone last week without significant improvement in symptoms.  She has seen some pink-tinged mucus.  Patient denies any chest pain, orthopnea, abdominal pain nausea vomiting or diarrhea.  No body aches or fever Complains that cough is keeping her up at night.  She has not taken any medications for cough.    Allergies  Allergen Reactions   Codeine Hives, Itching and Other (See Comments)    All over the body, including burning sensation.   Ciprofloxacin Palpitations    Immunization History  Administered Date(s) Administered   Influenza Split 11/25/2016   Influenza,inj,Quad PF,6+ Mos 11/04/2017, 10/21/2018, 11/26/2019   Moderna Sars-Covid-2 Vaccination 02/24/2019, 03/23/2019   PFIZER(Purple Top)SARS-COV-2 Vaccination 01/17/2020    Past Medical History:  Diagnosis Date   Abdominal distension    Abdominal pain    Arthritis    Asthma    Biliary colic    COPD (chronic obstructive pulmonary disease) (HCC)    Diarrhea    GERD (gastroesophageal reflux disease)    Hypertension    Nausea     Tobacco History: Social History   Tobacco Use  Smoking Status Former   Packs/day: 0.50   Years: 25.00   Pack years: 12.50   Types: Cigarettes   Quit date: 2011   Years since quitting: 11.7  Smokeless Tobacco Never   Counseling given: Not  Answered   Outpatient Medications Prior to Visit  Medication Sig Dispense Refill   albuterol (VENTOLIN HFA) 108 (90 Base) MCG/ACT inhaler INHALE 2 PUFFS INTO THE LUNGS EVERY 6 HOURS AS NEEDED FOR WHEEZING OR SHORTNESS OF BREATH 8.5 g 5   amLODipine (NORVASC) 5 MG tablet Take 1 tablet (5 mg total) by mouth daily. 30 tablet 3   DULERA 100-5 MCG/ACT AERO Inhale 2 puffs into the lungs 2 (two) times daily.     gabapentin (NEURONTIN) 100 MG capsule Take 1 capsule (100 mg total) by mouth 3 (three) times daily. One three times daily 360 capsule 3   hydrochlorothiazide (MICROZIDE) 12.5 MG capsule Take 1 capsule (12.5 mg total) by mouth daily. 30 capsule 3   RABEprazole (ACIPHEX) 20 MG tablet TAKE 1 TABLET 30-60 MINUTES BEFORE 1ST AND LAST MEALS OF THE DAY 60 tablet 5   valsartan (DIOVAN) 40 MG tablet Take 1 tablet by mouth daily.     acyclovir (ZOVIRAX) 200 MG capsule Take 200 mg by mouth 2 (two) times daily as needed (outbreaks).  (Patient not taking: Reported on 11/21/2020)     azithromycin (ZITHROMAX) 250 MG tablet Take 1 tablet (250 mg total) by mouth as directed. (Patient not taking: Reported on 11/21/2020) 6 tablet 0   predniSONE (DELTASONE) 10 MG tablet 4 x 2 days, 2 x 2 days,1 x 2 days, then stop (Patient not taking: Reported on 11/21/2020) 14 tablet 0   No  facility-administered medications prior to visit.     Review of Systems:   Constitutional:   No  weight loss, night sweats,  Fevers, chills, + fatigue, or  lassitude.  HEENT:   No headaches,  Difficulty swallowing,  Tooth/dental problems, or  Sore throat,                No sneezing, itching, ear ache,  +nasal congestion, post nasal drip,   CV:  No chest pain,  Orthopnea, PND, swelling in lower extremities, anasarca, dizziness, palpitations, syncope.   GI  No heartburn, indigestion, abdominal pain, nausea, vomiting, diarrhea, change in bowel habits, loss of appetite, bloody stools.   Resp:   No chest wall deformity  Skin: no rash or  lesions.  GU: no dysuria, change in color of urine, no urgency or frequency.  No flank pain, no hematuria   MS:  No joint pain or swelling.  No decreased range of motion.  No back pain.    Physical Exam  BP 124/70 (BP Location: Left Arm, Patient Position: Sitting, Cuff Size: Normal)   Pulse 75   Temp 98.2 F (36.8 C) (Oral)   Ht 4\' 10"  (1.473 m)   Wt 155 lb (70.3 kg)   SpO2 97%   BMI 32.40 kg/m   GEN: A/Ox3; pleasant , NAD, well nourished    HEENT:  [place]/AT,  NOSE-clear, THROAT-clear, no lesions, no postnasal drip or exudate noted.   NECK:  Supple w/ fair ROM; no JVD; normal carotid impulses w/o bruits; no thyromegaly or nodules palpated; no lymphadenopathy.  No stridor  RESP  few exp wheezes  no accessory muscle use, no dullness to percussion Speaks in full sentences with no audible distress.   CARD:  RRR, no m/r/g, no peripheral edema, pulses intact, no cyanosis or clubbing.  GI:   Soft & nt; nml bowel sounds; no organomegaly or masses detected.   Musco: Warm bil, no deformities or joint swelling noted.   Neuro: alert, no focal deficits noted.    Skin: Warm, no lesions or rashes    Lab Results:      BNP No results found for: BNP  ProBNP No results found for: PROBNP  Imaging: No results found.    PFT Results Latest Ref Rng & Units 12/04/2017 02/06/2017  FVC-Pre L 1.67 1.87  FVC-Predicted Pre % 83 85  FVC-Post L 1.99 2.04  FVC-Predicted Post % 98 92  Pre FEV1/FVC % % 67 70  Post FEV1/FCV % % 67 67  FEV1-Pre L 1.11 1.30  FEV1-Predicted Pre % 70 76  FEV1-Post L 1.34 1.37  DLCO uncorrected ml/min/mmHg 11.96 12.52  DLCO UNC% % 71 66  DLCO corrected ml/min/mmHg - 12.52  DLCO COR %Predicted % - 66  DLVA Predicted % 80 79  TLC L 4.63 4.53  TLC % Predicted % 109 101  RV % Predicted % 147 119    Lab Results  Component Value Date   NITRICOXIDE 7 10/21/2017        Assessment & Plan:   Asthmatic bronchitis with exacerbation Acute asthmatic  bronchitic exacerbation slow to resolve.  No significant provement with Z-Pak and prednisone.  Chest x-ray today.  Check COVID-19 testing. Will begin Augmentin and a prednisone taper.  Advised on mucociliary clearance with Mucinex or Robitussin.  Cough control with Tessalon.  Add in chlor tabs for postnasal drainage.  Plan  Patient Instructions  Check Covid 19 test, call back if positive .  Chest xray today .  Augmentin 875mg   Twice daily  for 1 week , take with food  Prednisone taper over next week.  Robitussin DM 2 tsp every 4hr for cough As needed   Tessalon Three times a day  for cough As needed   Dulera 2 puffs Twice daily, rinse after use.  Albuterol inhaler or neb As needed   Chlorpheniramine 4 mg 1 tab At bedtime  As needed  drainage .  Follow up with Dr. [name] in 6 weeks and As needed   Please contact office for sooner follow up if symptoms do not improve or worsen or seek emergency care          Rubye Oaks, NP 11/21/2020

## 2020-11-21 NOTE — Patient Instructions (Addendum)
Check Covid 19 test, call back if positive .  Chest xray today .  Augmentin 875mg  Twice daily  for 1 week , take with food  Prednisone taper over next week.  Robitussin DM 2 tsp every 4hr for cough As needed   Tessalon Three times a day  for cough As needed   Dulera 2 puffs Twice daily, rinse after use.  Albuterol inhaler or neb As needed   Chlorpheniramine 4 mg 1 tab At bedtime  As needed  drainage .  Follow up with Dr. in 6 weeks and As needed   Please contact office for sooner follow up if symptoms do not improve or worsen or seek emergency care

## 2020-11-22 ENCOUNTER — Telehealth: Payer: Self-pay | Admitting: Adult Health

## 2020-11-22 NOTE — Telephone Encounter (Signed)
Called and spoke with Patient.  Patient requesting cxr results from Tammy, NP.  Message routed to Tammy,NP to advise

## 2020-11-23 NOTE — Telephone Encounter (Signed)
Chest xray shows clear lungs   Chronic changes of atherosclerosis /scoliosis .   Cont w/ ov recs and follow up  . Please contact office for sooner follow up if symptoms do not improve or worsen or seek emergency care

## 2020-11-23 NOTE — Telephone Encounter (Signed)
Called and spoke with Patient.  Tammy, NP's results and recommendations given. Understanding stated. Nothing further at this time.

## 2020-11-29 IMAGING — CT CT CHEST W/O CM
2 of 3 series · 15 of 36 positions shown, 18 images · non-contrast
Comparison: Radiographs 01/13/2019 and 04/11/2018. CT 04/11/2018.

CLINICAL DATA: Right mid lung opacity on chest radiographs. History
of asthma.

EXAM:
CT CHEST WITHOUT CONTRAST
TECHNIQUE: Multidetector CT imaging of the chest was performed following the
standard protocol without IV contrast.

[Series 2: thorax · axial · 0.65mm/px · z∈[-314,-68]mm · 12 of 145 slices shown, 15 images]
[im 11/145  mediastinal]
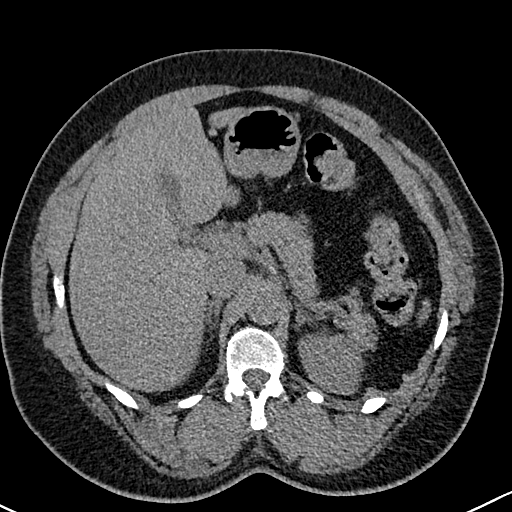
[im 11/145  lung]
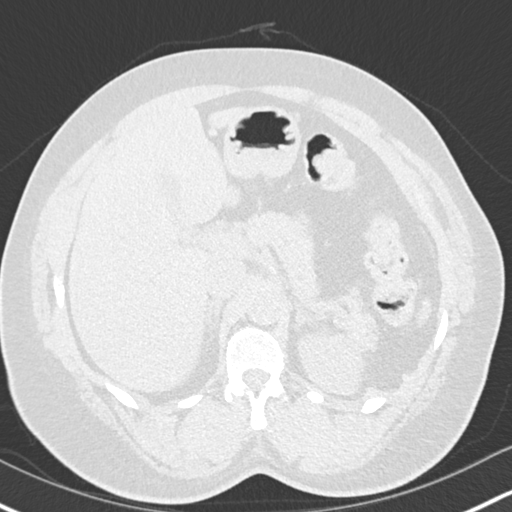
[im 22/145  lung]
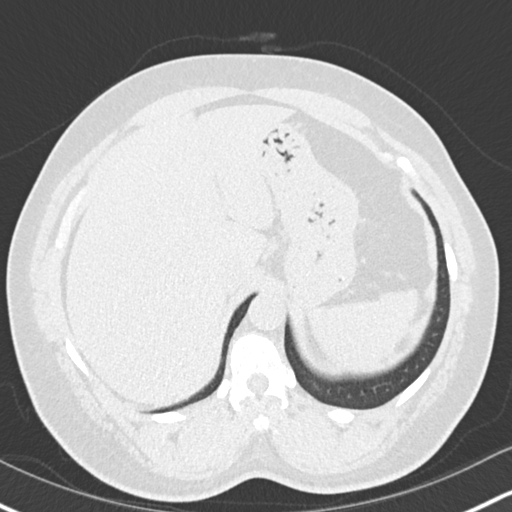
[im 33/145  lung]
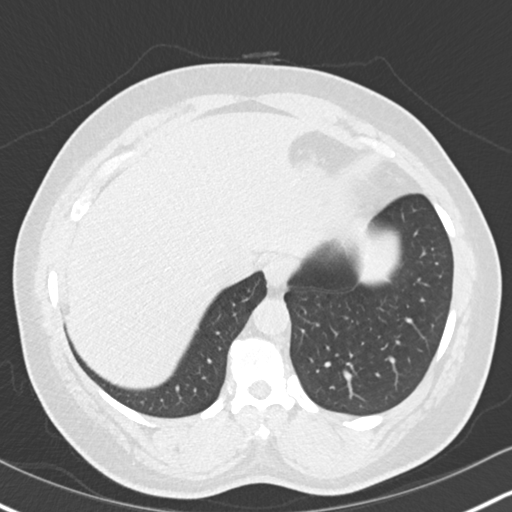
[im 43/145  lung]
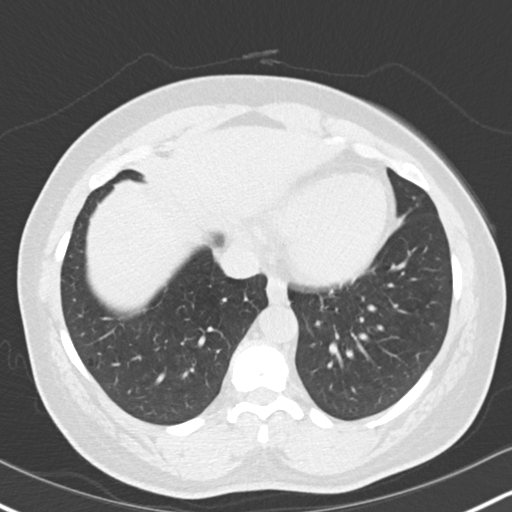
[im 54/145  mediastinal]
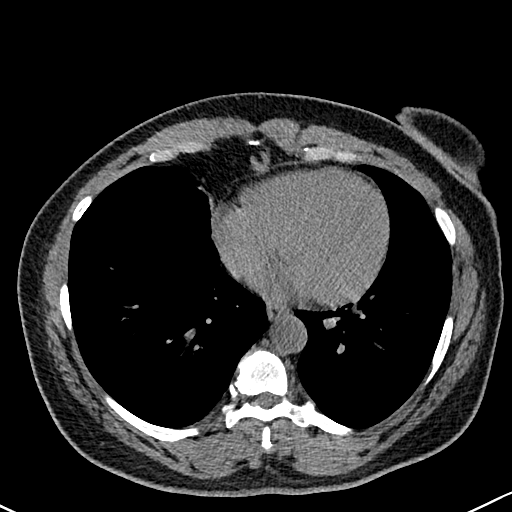
[im 54/145  lung]
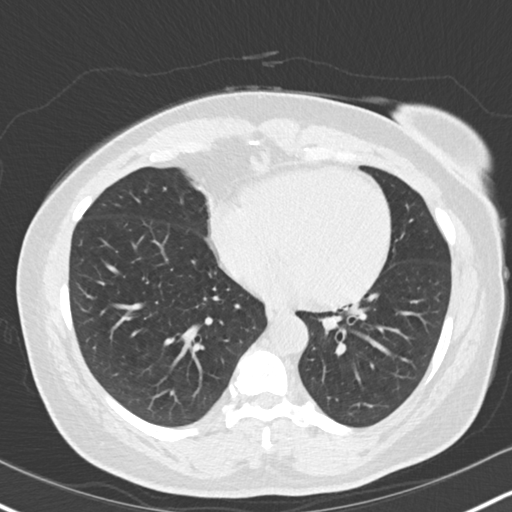
[im 65/145  lung]
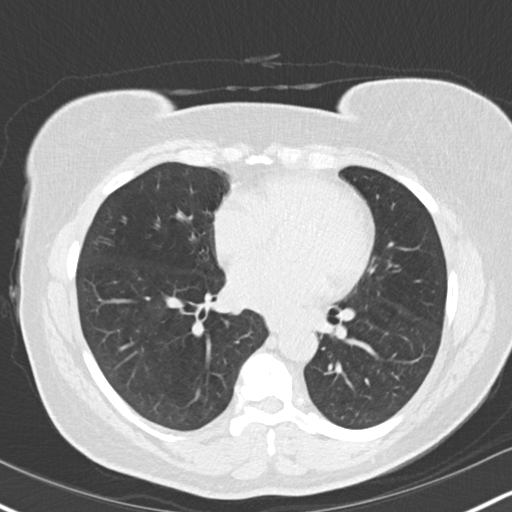
[im 81/145  lung]
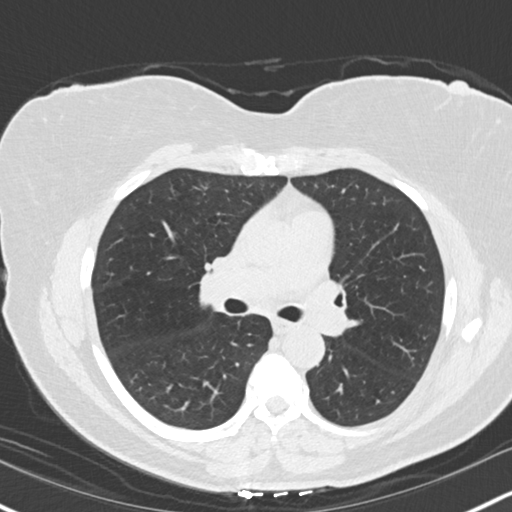
[im 91/145  lung]
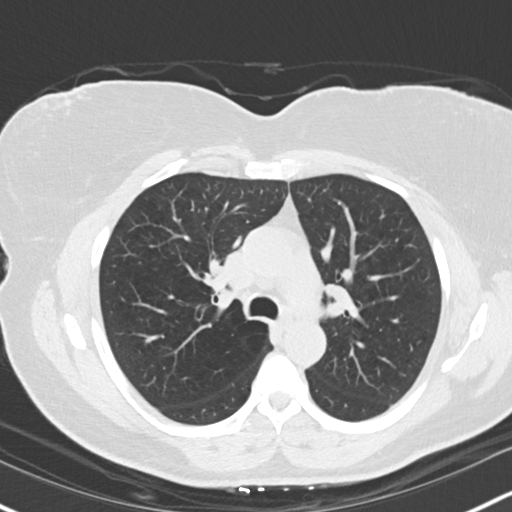
[im 102/145  mediastinal]
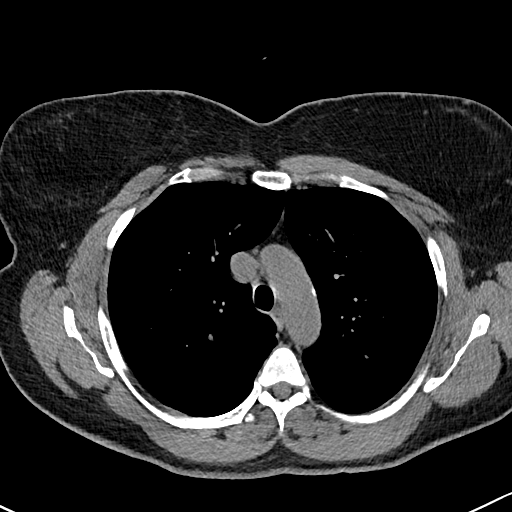
[im 102/145  lung]
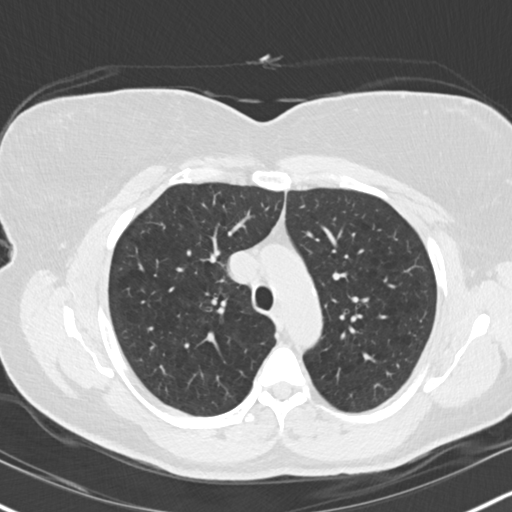
[im 113/145  lung]
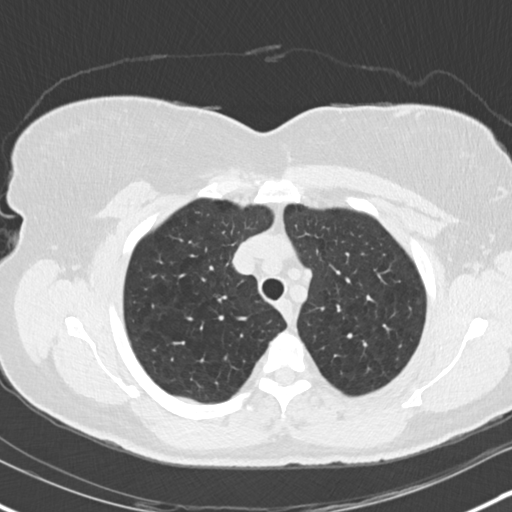
[im 123/145  lung]
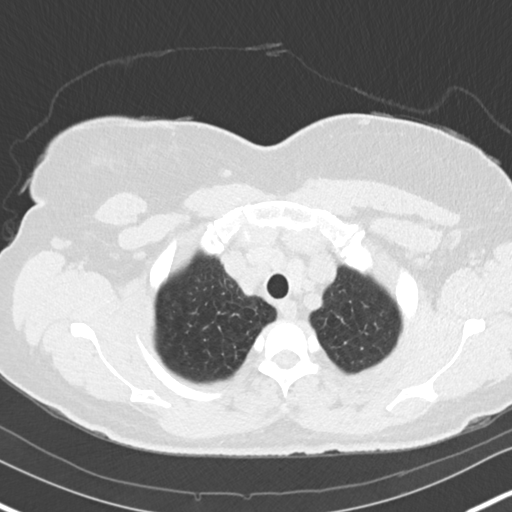
[im 134/145  lung]
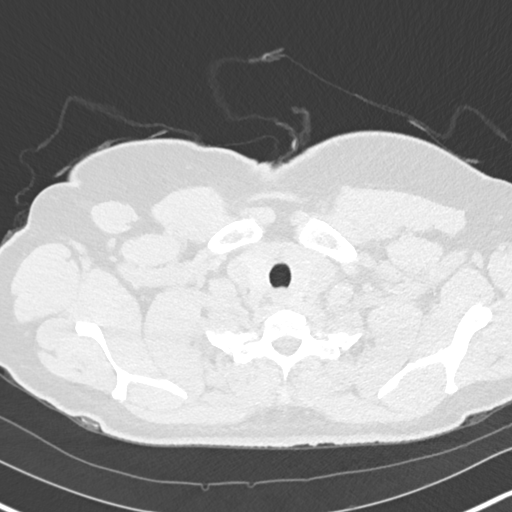

[Series 5: coronal · coronal · 0.59mm/px · 3 of 115 slices shown]
[im 23/115  lung]
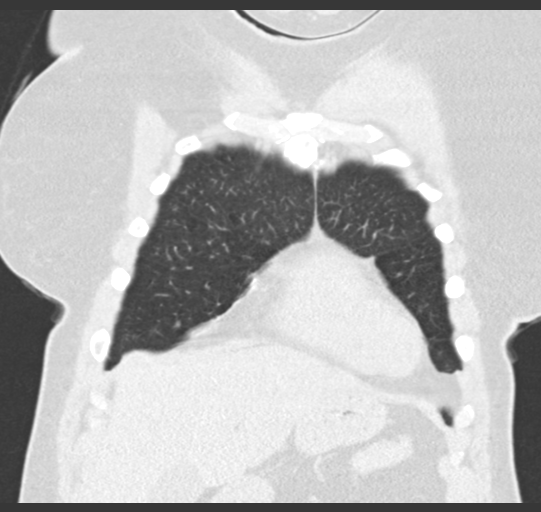
[im 46/115  lung]
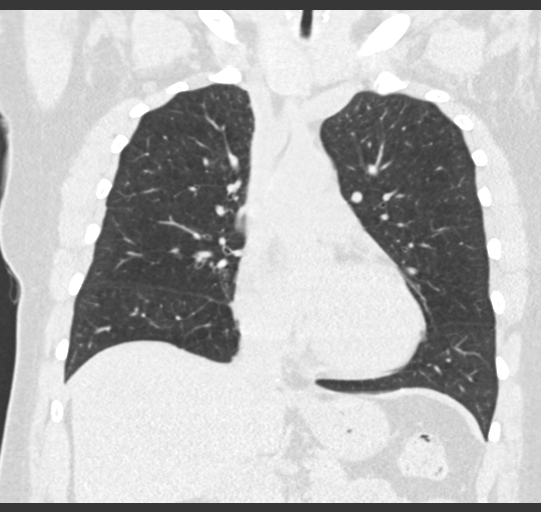
[im 69/115  lung]
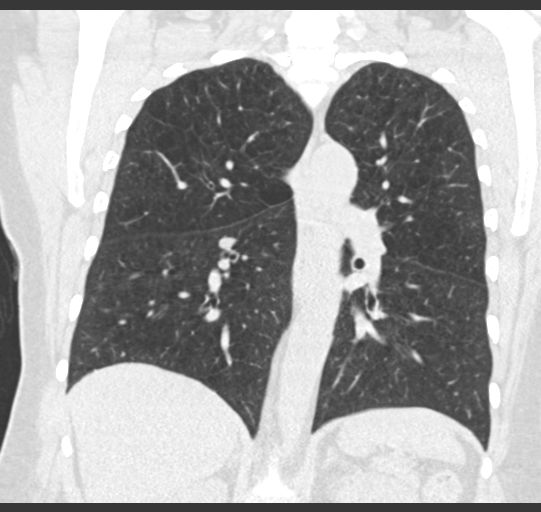

[15 of 36 positions shown; findings below may reference images not displayed]

FINDINGS: Cardiovascular: Minimal aortic and great vessel atherosclerosis. No
acute vascular findings on noncontrast CT. The heart size is normal.
There is no pericardial effusion.

Mediastinum/Nodes: There are no enlarged mediastinal, hilar or
axillary lymph nodes.Hilar assessment is limited by the lack of
intravenous contrast, although the hilar contours appear unchanged.
The thyroid gland appears unchanged. The trachea and esophagus
appear normal.

Lungs/Pleura: There is no pleural effusion or pneumothorax. There is
mild centrilobular emphysema. The lungs are clear without suspicious
nodularity. There is no abnormality to correspond with the
questioned finding on the recent radiographs.

Upper abdomen: No acute findings within the visualized upper
abdomen. Mild hepatic steatosis.

Musculoskeletal/Chest wall: There is no chest wall mass or
suspicious osseous finding. Prominent degenerative disc disease at
T7-8 and T8-9.
IMPRESSION: 1. No abnormality to correspond with the questioned finding on
recent radiographs or acute findings. No pulmonary nodule.
2. Mild hepatic steatosis.
3. Aortic Atherosclerosis (8WHUA-RWX.X) and Emphysema (8WHUA-G9G.2).

## 2021-01-08 ENCOUNTER — Ambulatory Visit: Payer: Medicare Other | Admitting: Internal Medicine

## 2021-01-30 ENCOUNTER — Telehealth: Payer: Self-pay | Admitting: Internal Medicine

## 2021-01-30 MED ORDER — DULERA 100-5 MCG/ACT IN AERO: 2.0000 | INHALATION_SPRAY | Freq: Two times a day (BID) | RESPIRATORY_TRACT | 5 refills | Status: DC

## 2021-01-30 NOTE — Telephone Encounter (Signed)
Rx for pt's [facility] inhaler has been sent to preferred pharmacy for pt. Called and spoke with pt letting her know this had been done and she verbalized understanding. Nothing further needed.

## 2021-02-06 DIAGNOSIS — I10 Essential (primary) hypertension: Secondary | ICD-10-CM | POA: Diagnosis not present

## 2021-02-06 DIAGNOSIS — Z0001 Encounter for general adult medical examination with abnormal findings: Secondary | ICD-10-CM | POA: Diagnosis not present

## 2021-04-02 ENCOUNTER — Ambulatory Visit: Payer: Medicare Other | Admitting: Internal Medicine

## 2021-04-24 ENCOUNTER — Other Ambulatory Visit: Payer: Self-pay

## 2021-04-24 ENCOUNTER — Encounter: Payer: Self-pay | Admitting: Internal Medicine

## 2021-04-24 ENCOUNTER — Ambulatory Visit: Payer: Medicare Other | Admitting: Internal Medicine

## 2021-04-24 DIAGNOSIS — J45991 Cough variant asthma: Secondary | ICD-10-CM

## 2021-04-24 MED ORDER — PREDNISONE 10 MG PO TABS
ORAL_TABLET | ORAL | 11 refills | Status: DC
Start: 2021-04-24 — End: 2021-10-02

## 2021-04-24 NOTE — Assessment & Plan Note (Addendum)
Quit smoking 2011  Recurrent cough since teenager really bad and daily since Aug 2018 12/26/2016  rec symbicort 80 2bid then return for full pfts - PFT's  02/06/2017  FEV1 1.37 (80 % ) ratio 67  p 5 % improvement from saba p symb 80  prior to study with DLCO  66/66c % corrects to 79  % for alv volume   - Spirometry 10/21/2017  FEV1 1.18 (70%)  Ratio 71 with min curvature during attack of cough/ mostly pseudowheeze on exam on symb 80 x2  - FENO 10/21/2017  =   7  On symb 80 x 2  - 11/04/2017     90% with spacer  - singulair added 11/04/2017  But not taking consistently as of 12/04/2017 > rec restart daily  X one week and stopped it due "dizzy"  - PFT's  12/04/2017  FEV1 1.34 (85 % ) ratio 67  p 20 % improvement from saba p nothing prior to study with DLCO  71 % corrects to 80  % for alv volume   - Allergy profile 12/04/2017 >  Eos 0.0 /  IgE  546 dust only  - cyclical cough rx 09/04/6267  - Spirometry 03/26/2018  FEV1 1.1 (73%)  Ratio 0.64 - 03/26/2018  After extensive coaching inhaler device,  effectiveness =    90% s spacer  - Gabapentin 100 mg tid trial 03/26/2018> attempted rx by non-adherent  - Sinus CT 04/07/2018 > Negative study.  No evidence of paranasal sinusitis. - Spirometry 04/08/2018  FEV1 0.7 (47%)  Ratio 0.52 with concave curvature p am symb 80 x 2 and neb in office   - 04/08/2018  After extensive coaching inhaler device,  effectiveness =    75% with smi > added trial of spiriva 2pffs daily x 2 weeks then return  - CTa 04/11/18 for hemoptysis with refractory cough > Limited study due to respiratory motion. No obvious pulmonary embolism or other acute findings. - 04/20/2018  After extensive coaching inhaler device,  effectiveness =    0% with spiriva smi which caused immediate severe cough so rec d/c spiriva  - 04/20/2018 rechallenge with gabapentin 100 mg bid  - 05/04/2018  After extensive coaching inhaler device,  effectiveness =    90% with spacer - s spacer starts coughing immediately  -  Spirometry 05/04/2018  FEV1 1.2 (75%)  Ratio 0.65 with mild curvature p symb 80 x 2 puffs   - 05/04/2018 increased gabapentin to 100 tid   - 05/25/2018 increased gabapentin to  100 mg qid > improved  - 07/23/2018 try ppi just in am  07/23/2018  After extensive coaching inhaler device,  effectiveness =    90% with spacer from baseline 75%  - 09/01/2018  changed to dulera 100 due to cost   - 1/26/2021changed to dulera 200 2bid   - 03/16/2020 rec try back on dulera 100 2bid due to refractory cough  > resolved  - 04/24/2021  After extensive coaching inhaler device,  effectiveness = 100% with spacer > continue dulera 100 2 bid and gabapentin max 300 qid, min 100 bid   All goals of chronic asthma control met including optimal function and elimination of symptoms with minimal need for rescue therapy.  Contingencies discussed in full including contacting this office immediately if not controlling the symptoms using the rule of two's.     For flare refractory to ABC action plan, adde pred x 6 days as PLAN D  Each maintenance medication was reviewed in detail including emphasizing most importantly the difference between maintenance and prns and under what circumstances the prns are to be triggered using an action plan format where appropriate.  Total time for H and P, chart review, counseling, reviewing hfa/spacer/neb device(s) and generating customized AVS unique to this office visit / same day charting = 24 min

## 2021-04-24 NOTE — Patient Instructions (Addendum)
No change in medications   In the event of a cough for any reason, double the amount of gabapentin you are taking before considering weaning with the maximum is 300 mg  4 x daily = 1200 mg max per day   Plan A = Automatic = Always=    Dulera 100 Take 2 puffs first thing in am and then another 2 puffs about 12 hours later.     Plan B = Backup (to supplement plan A, not to replace it) Only use your albuterol inhaler as a rescue medication to be used if you can't catch your breath by resting or doing a relaxed purse lip breathing pattern.  - The less you use it, the better it will work when you need it. - Ok to use the inhaler up to 2 puffs  every 4 hours if you must but call for appointment if use goes up over your usual need - Don't leave home without it !!  (think of it like the spare tire for your car)   Plan C = Crisis (instead of Plan B but only if Plan B stops working) - only use your albuterol nebulizer if you first try Plan B and it fails to help > ok to use the nebulizer up to every 4 hours but if start needing it regularly call for immediate appointment   Plan D = Deltasone = prednisone if needing more and more C Prednisone 10 mg take  4 each am x 2 days,   2 each am x 2 days,  1 each am x 2 days and stop    Please schedule a follow up visit in 3 months but call sooner if needed

## 2021-04-24 NOTE — Progress Notes (Signed)
Subjective:    Patient ID: Sue Mitchell, female   DOB: 1955/12/09     MRN: [id number]   Brief patient profile:  66  yobf  RT quit smoking 2011 @ wt 140  with h/o sinus symptoms assoc with cough and need for saba prn as teenager while living in [place] but after arrival here at age late 6's it changed to point where bothered her mostly with onset of cold weather typically req ov rx  abx and prednisone help a lot and this pattern continued even after quit smoking to where the episodes last longer and onset of this not directed linked to cold weather in fact  had one April 2018 never  Completely cleared  then flared again in Oct 01 2016 severe dry cough / eval by allergist /ent since onset neg w/u so referred by Dr Blenda Nicely to pulmonary clinic 12/26/16     History of Present Illness  12/26/2016 1st [name] office visit/ [name]   Chief Complaint  Patient presents with   Pulm Consult    Pt referred by Dr. Lind Guest ENT. Pt has productive cough-clear mucus sometimes yellow. Pt had horseness in voice for 6-8 weeks, no chills or fever, have some chest pain and tightness with cough.  recurrent cough since April 2018 never resolved p rx as uri then flaired early August 2018 rx zpak/pred/saba> some better  Cough was worse at hs now   Am feels wheezy not really coughing much up but what she does is white and < 1 tbsp Cough seems worse with exp to cold air at work  Assoc overt hb better on zantac  Some gen ant chest discomfort with severe coughing fits  rec Plan A = Automatic =  symbicort 66 Take 2 puffs first thing in am and then another 2 puffs about 12 hours later.  Plan B = Backup Only use your albuterol as a rescue medication  Start zantac 150- 300 mg after bfast and after supper until cough better  GERD diet    02/06/2017  f/u ov/[name] re:  GOLD I copd/ cough on cold air exp  Chief Complaint  Patient presents with   Follow-up    Cough had improved but then worsened again  with colder weather. She is using her albuterol inhaler 2 x per wk on average.   not limited by doe / steps ok except for knees  Some gerd at hs taking h2 am only  rec Pantoprazole (protonix) 40 mg  Take  30-60 min before first meal of the day and Zantac  300 mg bedtime until return to office - this is the best way to tell whether stomach acid is contributing to your problem.   GERD diet  Please schedule a follow up office visit in 6 weeks, call sooner if needed     10/21/2017  f/u ov/[name] re: acute refractory cough / saw allergist cannot name "all neg"  ? [name]?  Chief Complaint  Patient presents with   Follow-up    Last seen by [facility] 02/06/17. Patient states she has had a productive cough, post nasal drip, and constantly using her voice. At night, she becomes so congested that she has to breathe through her mouth instead of nose.    from last ov until 09/25/17 fine on on ranitidine one bid/symb 80 2bid Never took protonix  Was still needing saba twice a week at baseline and a lot more while working at [employer]  Then abruptly  worse Aug  1 lots sinus drainage clear > zpak early in Aug by [name] some better for a week or two then worse says ran out of symbicort one day prior to ov / using lots of saba now and severe nasal congestion as well as hoarseness and can't sleep due to cough rec Plan A = Automatic = symbiocort 80 Take 2 puffs first thing in am and then another 2 puffs about 12 hours later.  Plan B = Backup Only use your albuterol as a rescue medication  Prednisone 10 mg take  4 each am x 2 days,   2 each am x 2 days,  1 each am x 2 days and stop  Pantoprazole (protonix) 40 mg   Take  30-60 min before first meal of the day and ranitidine 300 mg each evening GERD diet      11/04/2017  f/u ov/Lis Savitt re: atypical asthma in RT  Chief Complaint  Patient presents with   Follow-up    Pt c/o sneezing, wheezing and cough with clear sputum- started after she mowed her lawn 1 day ago. She  has had to use her albuterol inhaler.   wakes up feeling good most am's s cough/ wheeze about 30 min later takes first 2 pffs of symb 80 around 5 15 am  Then next 2 pffs after 9 pm  Had been doing great prior mowing grass one day prior to OV Rarely need saba since last flare    Not limited by breathing from desired activities   rec Continue symbicort 80 Take 2 puffs first thing in am and then another 2 puffs about 12 hours later thru spacer Add singulair 10 mg each pm        02/20/18 NP ov rec Augmentin/ pred/ antihistamine/singulair > no better and could not take singulair / did not try zyrtec in am "makes me sleepy"     03/11/2018 extended  f/u ov/[name] re: refractory  Cough since aug 2018    Chief Complaint  Patient presents with   Follow-up    Cough not improving. She is coughing up clear to white sputum. She states her cough is worse when she goes to work and when she first lies down at night. She is using her albuterol inhaler once daily on average.   Dyspnea:  Not limited by breathing from desired activities   Cough: worse around 5pm  Then immediately when supine x then some during the night, never took zyrtec at hs as rec with sense of globus and throat / chest "congestion" but no excess mucus  Sleeping: on flat bed on side  SABA use: ? if neb helps more than hfa - not really  Sure but "it sure makes me shake" Still using mint products  rec Symbicort 80  Take 2 puffs first thing in am and then another 2 puffs about 12 hours later.  Pantoprazole (protonix) 40 mg   Take  30-60 min before first meal of the day and Pepcid (famotidine)  20 mg one @  bedtime until return to office - this is the best way to tell whether stomach acid is contributing to your problem.   GERD diet  Try zyrtec 10 mg after supper  Take delsym two tsp every 12 hours and supplement if needed with  vicodin up to 1every 4 hours to suppress the urge to cough. Swallowing water and/or using ice chips/non mint and  menthol containing candies (such as lifesavers or sugarless jolly ranchers) are also effective.  You should rest your voice and avoid activities that you know make you cough. Once you have eliminated the cough for 3 straight days try reducing the vicodin first,  then the delsym as tolerated.   On 03/14/18 Prednisone 10 mg take  4 each am x 2 days,   2 each am x 2 days,  1 each am x 2 days and stop  Please schedule a follow up office visit in 2 weeks, sooner if needed  with all medications /inhalers/ solutions in hand so we can verify exactly what you are taking. This includes all medications from all doctors and over the counters    03/26/2018  f/u ov/Maryln Eastham re: refractory cough/ wheeze on symb 80 2bid  Chief Complaint  Patient presents with   Follow-up    Pt c/o sinus congestion, cough with yellow sputum and wheezing. She is using her albuterol inhaler once per wk on average.   Dyspnea:  Ok unless cough Cough:  at work and at home same severity/ frequency  Sleeping: not coughing while sleeping  SABA use:  Not really helping 02: none  gen ant chest soreness from coughing/ not able to use vicodin daytime to suppress cough rec  Only use your albuterol as a rescue medication  Please see patient coordinator before you leave today  to schedule sinus CT Please remember to go to the  x-ray department  for your tests - we will call you with the results when they are available  Please schedule a follow up office visit in 2 weeks, sooner if needed  with all medications /inhalers/ solutions in hand so we can verify exactly what you are taking. This includes all medications from all doctors and over the counters       04/08/2018  f/u ov/Zailyn Thoennes re: refractory cough / brought just her inhalers to [facility]   Chief Complaint  Patient presents with   Acute Visit    Pt has had complaints of sinius drainage and a cough x4 days and also states she has had problems sleeping. Pt also has been hurting everywhere,  feeling weak, and has had sweats.  Dyspnea: usually only sob when coughing Cough: white mucus / min production "but feels like it's choking her"  Sleeping: on side bed pillows too heavy for bed blocks/ worse cough now at hs / not using cough suppression as advised  SABA use: rarely / symbicort count on 50 so missed about 14 doses or continued to use beyond the red line on the prior/ warned about both 02: none Reported after much back and forth "felt good only while on prednisone" and gradually worse off it  - turns out "felt good" does not mean the cough was gone but breathing was better. rec Prednisone 10mg   Take 4 for three days 3 for three days 2 for three days 1 for three days and stop Plan A = Automatic = Symbicort 80 Take 2 puffs first thing in am and then another 2 puffs about 12 hours later and spiriva 2 puff each am  Plan B = Backup Only use your albuterol inhaler as a rescue medication   Try gabapentin 100 mg twice daily bfast and supper and if can't take it twice daily just take at bedtime  For drainage / throat tickle try take CHLORPHENIRAMINE  4 mg (chlortabs  [facility])   Stay on protonix Take 30- 60 min before your first and last meals of the day  Take delsym two tsp every 12 hours  and supplement if needed with  vicodine  up to 2 every 4 hours to suppress the urge to cough. .  Please schedule a follow up office visit in 2 weeks, sooner if needed  with all medications /inhalers/ solutions in hand so we can verify exactly what you are taking. This includes all medications from all doctors and over the counters - needs spirometry on return     Admit date: 04/14/2018 Discharge date: 04/18/2018    Equipment/Devices: Nebulizer with albuterol sol'n, 2L oxygen ordered several days prior to discharge. Apparently patient left stating she would get the nebulizer and oxygen from [place] and did not want to wait for it to be delivered to the room per care management. Per CM note,  this will be delivered to the home.    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 At the time of discharge she reportedly maintained sufficient oxygenation during ambulation, but in the ED she was found to be hypoxic, wheezing, coughing, and weak, and was subsequently readmitted. With continued treatment including IV steroids she's subjectively significantly improved. She was weaned from oxygen while at rest and still desaturates on ambulation. She is discharged with DME in stable condition with follow up in 48 hours.   Discharge Diagnoses:  Principal Problem:   Acute respiratory failure with hypoxia (HCC)   Cough variant asthma  vs uacs wit vcd   Influenza   Cough   Acute hypoxic respiratory failure due to influenza A and asthma exacerbation:  - Completed 5 days of tamiflu - Continue symbicort, spiriva respimat - Scheduled and prn albuterol nebulizer (provided at DC) - Wheezing has improved so converted to po steroid with improvement, has prednisone at home, ordered to take 40mg  po daily until follow up.  - Continue supplemental oxygen to maintain SpO2 >90%. Despite significant improvement, does still need O2 at discharge. Will anticipate continued improvement and liberation from oxygen, will need recheck at follow up office visit with pulmonology the coming week. - Continue antitussive. PMPAware queried, has gotten hydrocodone-APAP from Dr. Melvyn Novas 2/12 and cough syrup intermittently from PCP.    Hemoptysis: CTA chest motion-degraded at last admission, no PE.  - Resolved.    Hypertension:  - Continue norvasc and HCTZ since recently stopping ARB for concern of exacerbating cough.    GERD:  - Continue PPI    04/20/2018  Extended f/u ov/Gailene Youkhana re: post hosp f/u  Chief Complaint  Patient presents  with   Follow-up    Breathing has improved. She is not using her albuterol inhaler but is using albuterol neb about 3 x per day.   Dyspnea:  MMRC2 = can't walk a nl pace on a flat grade s sob but does fine slow and flat  Cough: gone/ min urge to clear throat but now on tussionex Sleeping: side / pillows to prop up  SABA use: way over using as above  02: none at all  rec Continue pantoprazole 40 mg Take 30- 60 min before your first and last meals of the day  Gabapentin  Restart 100 mg twice daily bfast and bedtime Plan A = Automatic = Symbicort 80 Take 2 puffs first thing in am and then another 2 puffs about 12 hours later.  Work on inhaler technique:   Plan B = Backup Only use your albuterol  inhaler as a rescue medication  Plan C = Crisis - only use your albuterol nebulizer if you first try Plan B and it fails to help > ok to use the nebulizer up to every 4 hours but if start needing it regularly call for immediate appointment Finish the prednisone and stop spiriva F/u in 2 weeks with meds in hand (#35 on present symb 80 and one sample given) Add:  Consider performist/bud neb if can't wean off pred/saba neb and hfa makes her cough worse due to effects on upper airway    05/04/2018  f/u ov/Takako Minckler re:   symbicort 80 one full at home and total of 45 puffs samples/ 32 gabapenitn  Chief Complaint  Patient presents with   Acute Visit    Cough has improved but has not resolved. She is coughing up min, clear sputum. She has not had to use her albuterol inhaler.   Dyspnea:  MMRC1 = can walk nl pace, flat grade, can't hurry or go uphills or steps s sob  Cough: p exertion Sleeping: fine 30 degrees on pillows  SABA use: no saba  02: no rec Increase gabapentin to 100 mg three times a day  ok to return to work 05/16/2018  Please schedule a follow up office visit in 3 weeks, sooner if needed  with all medications /inhalers/ solutions in hand so we can verify exactly what you are taking. This includes  all medications from all doctors and over the counters        History of Present Illness: 05/25/2018   televist/Krystyn Picking re: cough/ sob symbicort 80 2bid /still on samples "half full"  Dyspnea:  Walked for 5 minutes some inclines  Cough: some daytime / dry assoc with hoarseness  Sleeping: flat bed/ 3 pillows  SABA use: none   rec For drainage / throat tickle try take CHLORPHENIRAMINE  4 mg (chlortab 4 mg) - take one every 4 hours as needed - available over the counter- may cause drowsiness so start with just a bedtime dose or two and see how you tolerate it before trying in daytime   Increase gabapentin to 100 mg four times daily  Continue Symbicort 80 Take 2 puffs first thing in am and then another 2 puffs about 12 hours later (use coupon and let me know if they won't honor it  I will take you out of work until end of April 2020       03/01/2020  f/u ov/Aiana Nordquist re: asthma/uacs doing poorly since Oct 2021 maint  On dulera 100 / gabapentin 100 mg qid /just finished prednisone over xmas /  Only using ppi qd and not taking gabapentin 100 qid during flare of cough/ wheeze/ sob instead relying more and more on saba including neb  Chief Complaint  Patient presents with   Acute Visit    Increased SOB, wheezing and cough for the past month. She is using her neb every am before [facility] and sometimes using it before bed. She is using her albuterol inhaler once daily on average.   Dyspnea:  Does fine unless misses dose of albuterol  Cough: severe / min clear mucus Sleeping: bed is flat / pillows - no bed blocks as rec  SABA use: way too much  02: none  Only on one ppi daily due to bloating rec Stop pantoprazole and start aciphex 20 mg Take 30- 60 min before your first and last meals of the day  Gabapentin 100 mg four times daily consisently  For cough /congestion  > mucinex dm 1200 mg every 12 hours and use flutter valve as much as possible  No change the other medications for now No work x next 2  weeks due to poorly controlled cough/wheeze/ congestion Please schedule a follow up office visit in 2 weeks, sooner if needed  with all medications /inhalers/ solutions in hand so we can verify exactly what you are taking. This includes all medications from all doctors and over the counters   03/16/2020  f/u ov/Raena Pau re:  Recurrent cough x 30's assoc with voice loss Chief Complaint  Patient presents with   Follow-up    Cough-clear and white, wheezing in am, sob same  Dyspnea:  Can't do hills at home due to L knee  Cough: sporadic / tickle in throat daytime / minimal white mucus/ can't use higher dose of gabapentin at work though hasn't been working due to cough  Sleeping: on side/ pillows no  bed blocks yet  SABA use: twice  A week 02: none avg gabapentin   = 100 mg  4 per day  Rec Treatment consists of avoiding foods that cause gas (especially boiled eggs, mexcican food but especially  beans and undercooked vegetables like  spinach and some salads)  and citrucel 1 heaping tsp twice daily with a large glass of water  Avoid broccoli and cabbage for now Push gabapentin as high as  300 mg 4x daily if you still feel like something stuck in your throat for at least a week  No work x 2 weeks then return to clinic with all your medications Add consider decreasing dulera to 100 2 bid    09/27/2020  f/u ov/Norvin Ohlin re: cough variant asthma vs vcd no longer on gabapentin / maint dulera 100 2bid  Chief Complaint  Patient presents with   Follow-up    Right side pain in her rib area radiating to her back, nausea x 2 wks. Her breathing is overall doing well and she is not coughing much. She has not needed her rescue inhaler recently.    Dyspnea:  Not limited by breathing from desired activities  /back at work -avoid steps  Cough: none now  Sleeping: bed is flat, sleeps on side, pain better supine  SABA use: none  02: none  Covid status:   vax x 3  Off aciphex x sev week and replaced with baking soda then  started having recurrent RUQ abd pain prev ruled out for GB dz and preseumed to have IBS but did not follow recs for rx  Rec Stop baking soda Pepcid (famotidine) 20 mg after bfast and after supper until you start back on aciphex Take 30- 60 min before your first and last meals of the day    04/24/2021  f/u ov/Dorean Hiebert re: cough since her 30's / ? cough variant asthma plus uacs  maint on aciphex  20 mg ac qd Chief Complaint  Patient presents with   Follow-up    Follow up. Patient says she's been coughing some the past couple weeks but everything else is going pretty good.    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   Sleeping  without nocturnal  or early am exacerbation  of respiratory  c/o's or need for noct saba. Also denies any obvious fluctuation of symptoms with weather or environmental changes or other aggravating or alleviating factors except as outlined above   No unusual exposure hx or h/o childhood pna/ asthma or knowledge of premature birth.  Current Allergies, Complete Past Medical History, Past Surgical History, Family History, and Social History were reviewed in [employer] record.  ROS  The following are not active complaints unless bolded Hoarseness, sore throat, dysphagia, dental problems, itching, sneezing,  nasal congestion or discharge of excess mucus or purulent secretions, ear ache,   fever, chills, sweats, unintended wt loss or wt gain, classically pleuritic or exertional cp,  orthopnea pnd or arm/hand swelling  or leg swelling, presyncope, palpitations, abdominal pain, anorexia,  nausea, vomiting, diarrhea  or change in bowel habits or change in bladder habits, change in stools or change in urine, dysuria, hematuria,  rash, arthralgias, visual complaints, headache, numbness, weakness or ataxia or problems with walking or coordination,  change in mood or  memory.        Current Meds  Medication Sig   acyclovir (ZOVIRAX) 200 MG capsule Take 200 mg by mouth 2 (two) times daily as needed (outbreaks).   albuterol (VENTOLIN HFA) 108 (90 Base) MCG/ACT inhaler INHALE 2 PUFFS INTO THE LUNGS EVERY 6 HOURS AS NEEDED FOR WHEEZING OR SHORTNESS OF BREATH   amLODipine (NORVASC) 5 MG tablet Take 1 tablet (5 mg total) by mouth daily.   DULERA 100-5 MCG/ACT AERO Inhale 2 puffs into the lungs 2 (two) times daily.   gabapentin (NEURONTIN) 100 MG capsule Take 1 capsule (100 mg total) by mouth 3 (three) times daily. One three times daily   hydrochlorothiazide (MICROZIDE) 12.5 MG capsule Take 1 capsule (12.5 mg total) by mouth daily.   RABEprazole (ACIPHEX) 20 MG tablet TAKE 1 TABLET 30-60 MINUTES BEFORE 1ST AND LAST MEALS OF THE DAY   valsartan (DIOVAN) 40 MG tablet Take 1 tablet by mouth daily.     g No obvious joint restrictions   SKIN: warm and dry without lesions    NEURO:  alert, approp, nl sensorium with  no motor or cerebellar deficits apparent.                             Objective:   Physical Exam    04/24/2021         155   09/27/2020         156  03/16/2020        175 03/01/2020           171 07/19/2019        173  01/13/2019      175  10/21/2018        172  09/01/2018          169 07/23/2018        170  05/04/2018         170  04/20/2018       169  04/08/2018       164  03/26/2018       173  03/11/2018       173  12/04/2017     170  11/04/2017       169  10/21/2017  169  02/06/2017     171   12/26/16 171 lb 6.4 oz (77.7 kg)  11/18/13 170 lb (77.1 kg)  03/21/11 171 lb (77.6 kg)       Vital signs reviewed  04/24/2021  - Note at rest 02 sats  94% on RA    General appearance:    amb bf nad   HEENT : pt wearing mask not removed for exam due to covid -19 concerns.    NECK :  without JVD/Nodes/TM/ nl carotid upstrokes bilaterally   LUNGS: no acc muscle use,  Nl contour chest which is clear to A and P bilaterally without cough on insp or exp maneuvers   CV:  RRR  no s3 or murmur or increase in P2, and no edema   ABD:  soft and nontender with nl inspiratory excursion in the supine position. No bruits or organomegaly appreciated, bowel sounds nl  MS:  Nl gait/ ext warm without deformities, calf tenderness, cyanosis or clubbing No obvious joint restrictions   SKIN: warm and dry without lesions    NEURO:  alert, approp, nl sensorium with  no motor or cerebellar deficits apparent.              Assessment:

## 2021-04-26 DIAGNOSIS — Z1231 Encounter for screening mammogram for malignant neoplasm of breast: Secondary | ICD-10-CM | POA: Diagnosis not present

## 2021-05-22 ENCOUNTER — Telehealth: Payer: Self-pay | Admitting: Internal Medicine

## 2021-05-23 NOTE — Telephone Encounter (Signed)
Dr. [name], please advise once forms have been completed. Thanks ?

## 2021-05-24 NOTE — Telephone Encounter (Signed)
Forms were on my desk without completion.  ? ?Patient is on social security and currently not working. Patient states that it has been hard finding a job that meets the social security requirements. Patient states she thinks it would be 7-12 months time frame before she would be able to find a job.  ? ?Forms faxed to [facility] office for Dr. [name] to complete.  ? ? ?

## 2021-06-07 DIAGNOSIS — I10 Essential (primary) hypertension: Secondary | ICD-10-CM | POA: Diagnosis not present

## 2021-06-07 DIAGNOSIS — R7309 Other abnormal glucose: Secondary | ICD-10-CM | POA: Diagnosis not present

## 2021-06-07 DIAGNOSIS — J301 Allergic rhinitis due to pollen: Secondary | ICD-10-CM | POA: Diagnosis not present

## 2021-06-07 DIAGNOSIS — K219 Gastro-esophageal reflux disease without esophagitis: Secondary | ICD-10-CM | POA: Diagnosis not present

## 2021-06-20 NOTE — Telephone Encounter (Signed)
Dr. Melvyn Novas, please advise if forms have been completed.  ?

## 2021-06-22 NOTE — Telephone Encounter (Signed)
Based on my last note, I don't have the info needed to complete the paperwork so will need to make appt to re-evaluate/ complete the paperwork for best chance that her goals will be met ?

## 2021-06-25 ENCOUNTER — Ambulatory Visit: Payer: Medicare Other | Admitting: Internal Medicine

## 2021-06-25 ENCOUNTER — Encounter: Payer: Self-pay | Admitting: Internal Medicine

## 2021-06-25 DIAGNOSIS — J45991 Cough variant asthma: Secondary | ICD-10-CM | POA: Diagnosis not present

## 2021-06-25 DIAGNOSIS — J310 Chronic rhinitis: Secondary | ICD-10-CM | POA: Diagnosis not present

## 2021-06-25 LAB — CBC WITH DIFFERENTIAL/PLATELET
Basophils Absolute: 0 10*3/uL (ref 0.0–0.1)
Basophils Relative: 0.4 % (ref 0.0–3.0)
Eosinophils Absolute: 0.1 10*3/uL (ref 0.0–0.7)
Eosinophils Relative: 1 % (ref 0.0–5.0)
HCT: 41.8 % (ref 36.0–46.0)
Hemoglobin: 13.8 g/dL (ref 12.0–15.0)
Lymphocytes Relative: 26.6 % (ref 12.0–46.0)
Lymphs Abs: 2.1 10*3/uL (ref 0.7–4.0)
MCHC: 33 g/dL (ref 30.0–36.0)
MCV: 91 fl (ref 78.0–100.0)
Monocytes Absolute: 0.7 10*3/uL (ref 0.1–1.0)
Monocytes Relative: 8.7 % (ref 3.0–12.0)
Neutro Abs: 5 10*3/uL (ref 1.4–7.7)
Neutrophils Relative %: 63.3 % (ref 43.0–77.0)
Platelets: 222 10*3/uL (ref 150.0–400.0)
RBC: 4.59 Mil/uL (ref 3.87–5.11)
RDW: 14.8 % (ref 11.5–15.5)
WBC: 7.9 10*3/uL (ref 4.0–10.5)

## 2021-06-25 NOTE — Progress Notes (Signed)
Subjective:  ?  ?Patient ID: Sue Mitchell, female   DOB: 1955-12-13     MRN: [id number] ? ? ?Brief patient profile:  ?65  yobf  RT quit smoking 2011 @ wt 140  with h/o sinus symptoms assoc with cough and need for saba prn as teenager while living in [place] but after arrival here at age late 62's it changed to point where bothered her mostly with onset of cold weather typically req ov rx  abx and prednisone help a lot and this pattern continued even after quit smoking to where the episodes last longer and onset of this not directed linked to cold weather in fact  had one April 2018 never  Completely cleared  then flared again in Oct 01 2016 severe dry cough / eval by allergist /ent since onset neg w/u so referred by Dr Doran Heater to pulmonary clinic 12/26/16  ? ? ? ?History of Present Illness  ?12/26/2016 1st [facility] office visit/ Sue Mitchell   ?Chief Complaint  ?Patient presents with  ? Pulm Consult  ?  Pt referred by Dr. Billy Fischer ENT. Pt has productive cough-clear mucus sometimes yellow. Pt had horseness in voice for 6-8 weeks, no chills or fever, have some chest pain and tightness with cough.  ?recurrent cough since April 2018 never resolved p rx as uri then flaired early August 2018 rx zpak/pred/saba> some better  ?Cough was worse at hs now   ?Am feels wheezy not really coughing much up but what she does is white and < 1 tbsp ?Cough seems worse with exp to cold air at work  ?Assoc overt hb better on zantac  ?Some gen ant chest discomfort with severe coughing fits  ?rec ?Plan A = Automatic =  symbicort 80 Take 2 puffs first thing in am and then another 2 puffs about 12 hours later.  ?Plan B = Backup ?Only use your albuterol as a rescue medication  ?Start zantac 150- 300 mg after bfast and after supper until cough better  ?GERD diet ?   ? ? ?02/20/18 NP ov rec ?Augmentin/ pred/ antihistamine/singulair > no better and could not take singulair / did not try zyrtec in am "makes me sleepy"  ?   ? ? ?04/24/2021  f/u ov/Sue Mitchell re: cough since her 30's / ? cough variant asthma plus uacs  maint on aciphex  20 mg ac qd ?Chief Complaint  ?Patient presents with  ? Follow-up  ?  Follow up. Patient says she's been coughing some the past couple weeks but everything else is going pretty good.   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  ? Plan B = Backup (to supplement plan A, not to replace it) ?Only use your albuterol inhaler as a rescue medication  ?Plan C = Crisis (instead of Plan B but only if Plan B stops working) ?- only use  your albuterol nebulizer if you first try Plan B and it fails to help  ?Plan D = Deltasone = prednisone if needing more and more C ?Prednisone 10 mg take  4 each am x 2 days,   2 each am x 2 days,  1 each am x 2 days and stop  ? ?Please schedule a follow up visit in 3 months but call sooner if needed  ? ? ?06/25/2021  f/u ov/Sue Mitchell re: cough variant asthma vs UACS/vcd  not consistently on maint rx  - stopped dulera (cost) and singulair (didn't think it worked)  worse since one week sneezing/ nasal congestion / has not used plan D  ?Chief Complaint  ?Patient presents with  ? 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 02: none ?Hfa:  at least once a day hfa/ rare neb 3 x month ?  ? ?No obvious day to day or  daytime variability or assoc excess/ purulent sputum or mucus plugs or hemoptysis or cp or chest tightness, subjective wheeze or overt sinus or hb symptoms.  ? ?Sleeping  without nocturnal  or early am exacerbation  of respiratory  c/o's or need for noct saba. Also denies any obvious fluctuation of symptoms with weather or environmental changes or other aggravating or alleviating factors except as outlined above  ? ?No unusual exposure hx or h/o childhood pna/ asthma or knowledge of premature birth. ? ?Current Allergies, Complete Past Medical History, Past Surgical History, Family History, and Social History were reviewed in [employer] [facility] record. ? ?ROS  The following are not active complaints unless bolded ?Hoarseness, sore throat, dysphagia, dental problems, itching, sneezing,  nasal congestion or discharge of excess mucus or purulent secretions, ear ache,   fever, chills, sweats, unintended wt loss or wt gain, classically pleuritic or exertional cp,  orthopnea pnd or arm/hand swelling  or leg swelling, presyncope, palpitations, abdominal pain, anorexia, nausea, vomiting, diarrhea  or change in bowel habits or change in bladder habits, change in stools or change in urine, dysuria, hematuria,  rash, arthralgias, visual complaints, headache, numbness, weakness or ataxia or problems with walking or coordination,  change in mood or  memory. ?      ? ?Current Meds  ?Medication Sig  ? acyclovir (ZOVIRAX) 200 MG capsule Take 200 mg by mouth 2 (two) times daily as needed (outbreaks).  ? albuterol (VENTOLIN HFA) 108 (90 Base) MCG/ACT inhaler INHALE 2 PUFFS INTO THE LUNGS EVERY 6 HOURS AS NEEDED FOR WHEEZING OR SHORTNESS OF BREATH  ? amLODipine (NORVASC) 5 MG tablet Take 1 tablet (5 mg total) by mouth daily.  ? DULERA 100-5 MCG/ACT AERO Inhale 2 puffs into the lungs 2 (two) times daily.  ? gabapentin (NEURONTIN) 100 MG capsule Take 1 capsule (100 mg total) by mouth 3 (three) times daily. One three times  daily  ? hydrochlorothiazide (MICROZIDE) 12.5 MG capsule Take 1 capsule (12.5 mg total) by mouth daily.  ? predniSONE (DELTASONE) 10 MG tablet Take  4 each am x 2 days,   2 each am x 2 days,  1 each am x 2 days and stop  ? RABEprazole (ACIPHEX) 20 MG tablet TAKE 1 TABLET 30-60 MINUTES BEFORE 1ST AND LAST MEALS OF THE DAY  ? valsartan (DIOVAN) 40 MG tablet Take 1  tablet by mouth daily.  ?    ? ? ?  ?   ?Objective:  ? Physical Exam  ? ?06/25/2021          154 ?04/24/2021        155  ? 09/27/2020         156  ?03/16/2020        175 ?03/01/2020           171 ?07/19/2019        173  ?01/13/2019      175  ?10/21/2018        172  ?09/01/2018          169 ?07/23/2018        170  ?05/04/2018         170  ?04/20/2018       169  ?04/08/2018       164  ?03/26/2018       173  ?03/11/2018       173  ?12/04/2017     170  ?11/04/2017       169  ?10/21/2017       169  ?02/06/2017     171   ?12/26/16 171 lb 6.4 oz (77.7 kg)  ?11/18/13 170 lb (77.1 kg)  ?03/21/11 171 lb (77.6 kg)  ?  ?   ?  ?Vital signs reviewed  06/25/2021  - Note at rest 02 sats  98% on RA  ? ?General appearance:    amb bf nad ? ? HEENT : mod turbinate edema/ clear secretions/ min cobblestoning of oropharynx  ? ? ?NECK :  without JVD/Nodes/TM/ nl carotid upstrokes bilaterally ? ? ?LUNGS: no acc muscle use,  Nl contour chest which is clear to A and P bilaterally without cough on insp or exp maneuvers ? ? ?CV:  RRR  no s3 or murmur or increase in P2, and no edema  ? ?ABD:  soft and nontender with nl inspiratory excursion in the supine position. No bruits or organomegaly appreciated, bowel sounds nl ? ?MS:  Nl gait/ ext warm without deformities, calf tenderness, cyanosis or clubbing ?No obvious joint restrictions  ? ?SKIN: warm and dry without lesions   ? ?NEURO:  alert, approp, nl sensorium with  no motor or cerebellar deficits apparent.   ?  ? ? ?  ?  ?   ?Assessment:  ?   ?  ?   ? ?

## 2021-06-25 NOTE — Patient Instructions (Addendum)
No change in medications  ? ?Plan A = Automatic = Always=    Dulera 100 Take 2 puffs first thing in am and then another 2 puffs about 12 hours later.  ?  ? ?Plan B = Backup (to supplement plan A, not to replace it) ?Only use your albuterol inhaler as a rescue medication to be used if you can't catch your breath by resting or doing a relaxed purse lip breathing pattern.  ?- The less you use it, the better it will work when you need it. ?- Ok to use the inhaler up to 2 puffs  every 4 hours if you must but call for appointment if use goes up over your usual need ?- Don't leave home without it !!  (think of it like the spare tire for your car)  ? ?Plan C = Crisis (instead of Plan B but only if Plan B stops working) ?- only use your albuterol nebulizer if you first try Plan B and it fails to help > ok to use the nebulizer up to every 4 hours but if start needing it regularly call for immediate appointment ? ? ?Plan D = Deltasone = prednisone if needing more and more C ?Prednisone 10 mg take  4 each am x 2 days,   2 each am x 2 days,  1 each am x 2 days and stop  ? ?Please remember to go to the lab department   for your tests - we will call you with the results when they are available. ?    ? ?Please schedule a follow up visit in 3 months but call sooner if needed  ?

## 2021-06-26 ENCOUNTER — Encounter: Payer: Self-pay | Admitting: Internal Medicine

## 2021-06-26 LAB — IGE: IgE (Immunoglobulin E), Serum: 329 kU/L — ABNORMAL HIGH (ref <=114)

## 2021-06-26 NOTE — Assessment & Plan Note (Addendum)
Allergy screen 06/25/2021 >  Eos 0.1 /  IgE  Pending  ? ?Consider referring to allergy if IgE up as I believe the rhinitis may actually be more of a factor in her cough than the asthma component.  ? ? ?Each maintenance medication was reviewed in detail including emphasizing most importantly the difference between maintenance and prns and under what circumstances the prns are to be triggered using an action plan format where appropriate. ? ?Total time for H and P, chart review, counseling, reviewing hfa device(s) and generating customized AVS unique to this office visit / same day charting = 32 min addressing longterm implications of inability to work 40 h a week and filling out paperwork with her outlining the problems and restrictions  ?

## 2021-06-26 NOTE — Assessment & Plan Note (Addendum)
Quit smoking 2011  ?Recurrent cough since teenager really bad and daily since Aug 2018 ?12/26/2016  rec symbicort 80 2bid then return for full pfts ?- PFT's  02/06/2017  FEV1 1.37 (80 % ) ratio 67  p 5 % improvement from saba p symb 80  prior to study with DLCO  66/66c % corrects to 79  % for alv volume   ?- Spirometry 10/21/2017  FEV1 1.18 (70%)  Ratio 71 with min curvature during attack of cough/ mostly pseudowheeze on exam on symb 80 x2  ?- FENO 10/21/2017  =   7  On symb 80 x 2  ?- 11/04/2017     90% with spacer  ?- singulair added 11/04/2017  But not taking consistently as of 12/04/2017 > rec restart daily  X one week and stopped it due "dizzy"  ?- PFT's  12/04/2017  FEV1 1.34 (85 % ) ratio 67  p 20 % improvement from saba p nothing prior to study with DLCO  71 % corrects to 80  % for alv volume   ?- Allergy profile 12/04/2017 >  Eos 0.0 /  IgE  546 dust only  ?- cyclical cough rx 9/52/8413  ?- Spirometry 03/26/2018  FEV1 1.1 (73%)  Ratio 0.64 ?- 03/26/2018  After extensive coaching inhaler device,  effectiveness =    90% s spacer  ?- Gabapentin 100 mg tid trial 03/26/2018> attempted rx by non-adherent  ?- Sinus CT 04/07/2018 > Negative study.  No evidence of paranasal sinusitis. ?- Spirometry 04/08/2018  FEV1 0.7 (47%)  Ratio 0.52 with concave curvature p am symb 80 x 2 and neb in office   ?- 04/08/2018  After extensive coaching inhaler device,  effectiveness =    75% with smi > added trial of spiriva 2pffs daily x 2 weeks then return  ?- CTa 04/11/18 for hemoptysis with refractory cough > Limited study due to respiratory motion. No obvious pulmonary embolism or other acute findings. ?- 04/20/2018  After extensive coaching inhaler device,  effectiveness =    0% with spiriva smi which caused immediate severe cough so rec d/c spiriva  ?- 04/20/2018 rechallenge with gabapentin 100 mg bid  ?- 05/04/2018  After extensive coaching inhaler device,  effectiveness =    90% with spacer - s spacer starts coughing immediately  ?-  Spirometry 05/04/2018  FEV1 1.2 (75%)  Ratio 0.65 with mild curvature p symb 80 x 2 puffs   ?- 05/04/2018 increased gabapentin to 100 tid   ?- 05/25/2018 increased gabapentin to  100 mg qid > improved  ?- 07/23/2018 try ppi just in am  ?07/23/2018  After extensive coaching inhaler device,  effectiveness =    90% with spacer from baseline 75%  ?- 09/01/2018  changed to dulera 100 due to cost   ?- 1/26/2021changed to dulera 200 2bid   ?- 03/16/2020 rec try back on dulera 100 2bid due to refractory cough  > resolved  ?- 04/24/2021  After extensive coaching inhaler device,  effectiveness = 100% with spacer > continue dulera 100 2 bid and gabapentin max 300 qid, min 100 bid and added pred x 6 d as PLAN D ? ?All goals of chronic asthma control met including optimal function and elimination of symptoms with minimal need for rescue therapy. ? ?Contingencies discussed in full including contacting this office immediately if not controlling the symptoms using the rule of two's.    ? ?Cough continues to limit ability to work and can't use higher dose gabapentin  due to cns side effects  ? ?    ?  ? ? ?     ?

## 2021-06-27 NOTE — Progress Notes (Signed)
Spoke with pt and notified of results per Dr. [name]. Pt verbalized understanding and denied any questions. ?Pt declined referral to allergy at this time and wants to d/w MW further at [facility].

## 2021-07-19 ENCOUNTER — Other Ambulatory Visit: Payer: Self-pay | Admitting: Internal Medicine

## 2021-07-20 ENCOUNTER — Other Ambulatory Visit: Payer: Self-pay

## 2021-07-20 ENCOUNTER — Telehealth: Payer: Self-pay | Admitting: Internal Medicine

## 2021-07-20 MED ORDER — ALBUTEROL SULFATE HFA 108 (90 BASE) MCG/ACT IN AERS: 2.0000 | INHALATION_SPRAY | Freq: Four times a day (QID) | RESPIRATORY_TRACT | 5 refills | Status: DC | PRN

## 2021-07-20 NOTE — Telephone Encounter (Signed)
Medication refilled. ATC patient to notify. LVM letting her know refill was sent. No notes of a PA in chart. Advised pt in vm to call back if there were any issues

## 2021-10-01 ENCOUNTER — Ambulatory Visit: Payer: Medicare Other | Admitting: Internal Medicine

## 2021-10-02 ENCOUNTER — Encounter: Payer: Self-pay | Admitting: Internal Medicine

## 2021-10-02 ENCOUNTER — Ambulatory Visit: Payer: Medicare Other | Admitting: Internal Medicine

## 2021-10-02 VITALS — BP 118/76 | HR 78 | Temp 98.4°F | Ht 59.0 in | Wt 150.8 lb

## 2021-10-02 DIAGNOSIS — J45991 Cough variant asthma: Secondary | ICD-10-CM | POA: Diagnosis not present

## 2021-10-02 DIAGNOSIS — J310 Chronic rhinitis: Secondary | ICD-10-CM | POA: Diagnosis not present

## 2021-10-02 DIAGNOSIS — J4541 Moderate persistent asthma with (acute) exacerbation: Secondary | ICD-10-CM | POA: Diagnosis not present

## 2021-10-02 LAB — POCT EXHALED NITRIC OXIDE: FeNO level (ppb): 5

## 2021-10-02 MED ORDER — ALBUTEROL SULFATE (2.5 MG/3ML) 0.083% IN NEBU
2.5000 mg | INHALATION_SOLUTION | Freq: Four times a day (QID) | RESPIRATORY_TRACT | 2 refills | Status: DC | PRN
Start: 2021-10-02 — End: 2022-04-30

## 2021-10-02 NOTE — Progress Notes (Unsigned)
Subjective:    Patient ID: Sue Mitchell, female   DOB: 08/23/1955     MRN: [id number]   Brief patient profile:  70  yobf  RT quit smoking 2011 @ wt 140  with h/o sinus symptoms assoc with cough and need for saba prn as teenager while living in [place] but after arrival here at age late 22's it changed to point where bothered her mostly with onset of cold weather typically req ov rx  abx and prednisone help a lot and this pattern continued even after quit smoking to where the episodes last longer and onset of this not directed linked to cold weather in fact  had one April 2018 never  Completely cleared  then flared again in Oct 01 2016 severe dry cough / eval by allergist /ent since onset neg w/u so referred by Dr Blenda Nicely to pulmonary clinic 12/26/16     History of Present Illness  12/26/2016 1st [facility] office visit/ Sue Mitchell   Chief Complaint  Patient presents with   Pulm Consult    Pt referred by Dr. Lind Guest ENT. Pt has productive cough-clear mucus sometimes yellow. Pt had horseness in voice for 6-8 weeks, no chills or fever, have some chest pain and tightness with cough.  recurrent cough since April 2018 never resolved p rx as uri then flaired early August 2018 rx zpak/pred/saba> some better  Cough was worse at hs now   Am feels wheezy not really coughing much up but what she does is white and < 1 tbsp Cough seems worse with exp to cold air at work  Assoc overt hb better on zantac  Some gen ant chest discomfort with severe coughing fits  rec Plan A = Automatic =  symbicort 80 Take 2 puffs first thing in am and then another 2 puffs about 12 hours later.  Plan B = Backup Only use your albuterol as a rescue medication  Start zantac 150- 300 mg after bfast and after supper until cough better  GERD diet      02/20/18 NP ov rec Augmentin/ pred/ antihistamine/singulair > no better and could not take singulair / did not try zyrtec in am "makes me sleepy"       04/24/2021  f/u ov/Sue Mitchell re: cough since her 30's / ? cough variant asthma plus uacs  maint on aciphex  20 mg ac qd Chief Complaint  Patient presents with   Follow-up    Follow up. Patient says she's been coughing some the past couple weeks but everything else is going pretty good.   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   Plan B = Backup (to supplement plan A, not to replace it) Only use your albuterol inhaler as a rescue medication  Plan C = Crisis (instead of Plan B but only if Plan B stops working) - only use  your albuterol nebulizer if you first try Plan B and it fails to help  Plan D = Deltasone = prednisone if needing more and more C Prednisone 10 mg take  4 each am x 2 days,   2 each am x 2 days,  1 each am x 2 days and stop   Please schedule a follow up visit in 3 months but call sooner if needed    06/25/2021  f/u ov/Sue Mitchell re: cough variant asthma vs UACS/vcd  not consistently on maint rx  - stopped dulera (cost) and singulair (didn't think it worked)  worse since one week sneezing/ nasal congestion / has not used plan D  Chief Complaint  Patient presents with   Follow-up  Dyspnea:  working 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   Plan B = Backup (to supplement plan A, not to replace it) Only use your albuterol inhaler as a rescue medication  Plan C = Crisis (instead of Plan B but only if Plan B stops working) - only use your albuterol nebulizer if you first try Plan B  Plan D = Deltasone = prednisone if needing more and more C Prednisone 10 mg take  4 each am x 2 days,   2 each am x 2 days,  1 each am x 2 days and stop   Allergy screen 06/25/2021 >  Eos 0.1 /  IgE 329       10/02/2021  f/u ov/Sue Mitchell re: cough variant asthma vs vcd  maint on dulera 100  / gabapentin prn / admits not really adherent to concept of "maint meds"  Chief Complaint  Patient presents with   Follow-up    Pt states she has some wheezing, sneezing and SOB in the mornings. Pt states she has a new cough that started x1 week ago  Dyspnea:  mornings are difficult but variable  Cough: variable also s pattern  Sleeping: does fine  SABA use: rarely  02: none  Covid status:   vaccinated all  and likely the original    No obvious day to day or daytime variability or assoc excess/ purulent sputum or mucus plugs or hemoptysis or cp or chest tightness,  or overt sinus or hb symptoms.   Sleeping  without nocturnal  or early am exacerbation  of respiratory  c/o's or need for noct saba. Also denies any obvious fluctuation of symptoms with weather or environmental changes or other aggravating or alleviating factors except as outlined above   No unusual exposure hx or h/o childhood pna/ asthma or knowledge of premature birth.  Current Allergies, Complete Past Medical History, Past Surgical History, Family History, and Social History were reviewed in [employer] [facility] record.  ROS  The following are not active complaints unless bolded Hoarseness, sore throat, dysphagia, dental problems, itching, sneezing,  nasal  congestion or discharge of excess mucus or purulent secretions, ear ache,   fever, chills, sweats, unintended wt loss or wt gain, classically pleuritic or exertional cp,  orthopnea pnd or arm/hand  swelling  or leg swelling, presyncope, palpitations, abdominal pain, anorexia, nausea, vomiting, diarrhea  or change in bowel habits or change in bladder habits, change in stools or change in urine, dysuria, hematuria,  rash, arthralgias, visual complaints, headache, numbness, weakness or ataxia or problems with walking or coordination,  change in mood or  memory.        Current Meds  Medication Sig   acyclovir (ZOVIRAX) 200 MG capsule Take 200 mg by mouth 2 (two) times daily as needed (outbreaks).   albuterol (VENTOLIN HFA) 108 (90 Base) MCG/ACT inhaler Inhale 2 puffs into the lungs every 6 (six) hours as needed for wheezing or shortness of breath.   amLODipine (NORVASC) 5 MG tablet Take 1 tablet (5 mg total) by mouth daily.   DULERA 100-5 MCG/ACT AERO Inhale 2 puffs into the lungs 2 (two) times daily.   gabapentin (NEURONTIN) 100 MG capsule Take 1 capsule (100 mg total) by mouth 3 (three) times daily. One three times daily   hydrochlorothiazide (MICROZIDE) 12.5 MG capsule Take 1 capsule (12.5 mg total) by mouth daily.   RABEprazole (ACIPHEX) 20 MG tablet TAKE 1 TABLET 30-60 MINUTES BEFORE 1ST AND LAST MEALS OF THE DAY   valsartan (DIOVAN) 40 MG tablet Take 1 tablet by mouth daily.                 Objective:   Physical Exam   10/02/2021          150  06/25/2021          154 04/24/2021        155   09/27/2020         156  03/16/2020        175 03/01/2020           171 07/19/2019        173  01/13/2019      175  10/21/2018        172  09/01/2018          169 07/23/2018        170  05/04/2018         170  04/20/2018       169  04/08/2018       164  03/26/2018       173  03/11/2018       173  12/04/2017     170  11/04/2017       169  10/21/2017       169  02/06/2017     171   12/26/16 171 lb 6.4 oz (77.7  kg)  11/18/13 170 lb (77.1 kg)  03/21/11 171 lb (77.6 kg)       HEENT : Oropharynx  min cobblestoning   Nasal turbinates mild nonspecfic edema    NECK :  without  apparent JVD/ palpable Nodes/TM    LUNGS: no acc muscle use,  Min barrel  contour chest wall with bilateral  slightly decreased bs s audible wheeze and  without cough on insp or exp maneuvers and min  Hyperresonant  to  percussion bilaterally    CV:  RRR  no s3 or murmur or increase in P2, and no edema   ABD:  soft and nontender with pos end  insp Hoover's  in the supine position.  No bruits or organomegaly appreciated   MS:  Nl gait/ ext warm without deformities Or obvious joint restrictions  calf tenderness, cyanosis or clubbing     SKIN: warm and dry  without lesions    NEURO:  alert, approp, nl sensorium with  no motor or cerebellar deficits apparent.               Assessment:

## 2021-10-02 NOTE — Patient Instructions (Signed)
Plan A = Automatic = Always=    dulera 100 Take 2 puffs first thing in am and then another 2 puffs about 12 hours later.     Plan B = Backup (to supplement plan A, not to replace it) Only use your albuterol inhaler as a rescue medication to be used if you can't catch your breath by resting or doing a relaxed purse lip breathing pattern.  - The less you use it, the better it will work when you need it. - Ok to use the inhaler up to 2 puffs  every 4 hours if you must but call for appointment if use goes up over your usual need - Don't leave home without it !!  (think of it like the spare tire for your car)   Plan C = Crisis (instead of Plan B but only if Plan B stops working) - only use your albuterol nebulizer if you first try Plan B and it fails to help > ok to use the nebulizer up to every 4 hours but if start needing it regularly call for immediate appointment   We will be referring you to Dr Kathyrn Lass group for allergy eval    Please schedule a follow up visit in 3 months but call sooner if needed - unless Dr Kathyrn Lass group takes over

## 2021-10-03 ENCOUNTER — Encounter: Payer: Self-pay | Admitting: Internal Medicine

## 2021-10-03 NOTE — Assessment & Plan Note (Signed)
Quit smoking 2011  Recurrent cough since teenager really bad and daily since Aug 2018 12/26/2016  rec symbicort 80 2bid then return for full pfts - PFT's  02/06/2017  FEV1 1.37 (80 % ) ratio 67  p 5 % improvement from saba p symb 80  prior to study with DLCO  66/66c % corrects to 79  % for alv volume   - Spirometry 10/21/2017  FEV1 1.18 (70%)  Ratio 71 with min curvature during attack of cough/ mostly pseudowheeze on exam on symb 80 x2  - FENO 10/21/2017  =   7  On symb 80 x 2  - 11/04/2017     90% with spacer  - singulair added 11/04/2017  But not taking consistently as of 12/04/2017 > rec restart daily  X one week and stopped it due "dizzy"  - PFT's  12/04/2017  FEV1 1.34 (85 % ) ratio 67  p 20 % improvement from saba p nothing prior to study with DLCO  71 % corrects to 80  % for alv volume   - Allergy profile 12/04/2017 >  Eos 0.0 /  IgE  546 dust only  - cyclical cough rx 6/76/1950  - Spirometry 03/26/2018  FEV1 1.1 (73%)  Ratio 0.64 - 03/26/2018  After extensive coaching inhaler device,  effectiveness =    90% s spacer  - Gabapentin 100 mg tid trial 03/26/2018> attempted rx by non-adherent  - Sinus CT 04/07/2018 > Negative study.  No evidence of paranasal sinusitis. - Spirometry 04/08/2018  FEV1 0.7 (47%)  Ratio 0.52 with concave curvature p am symb 80 x 2 and neb in office   - 04/08/2018  After extensive coaching inhaler device,  effectiveness =    75% with smi > added trial of spiriva 2pffs daily x 2 weeks then return  - CTa 04/11/18 for hemoptysis with refractory cough > Limited study due to respiratory motion. No obvious pulmonary embolism or other acute findings. - 04/20/2018  After extensive coaching inhaler device,  effectiveness =    0% with spiriva smi which caused immediate severe cough so rec d/c spiriva  - 04/20/2018 rechallenge with gabapentin 100 mg bid  - 05/04/2018  After extensive coaching inhaler device,  effectiveness =    90% with spacer - s spacer starts coughing immediately  -  Spirometry 05/04/2018  FEV1 1.2 (75%)  Ratio 0.65 with mild curvature p symb 80 x 2 puffs   - 05/04/2018 increased gabapentin to 100 tid   - 05/25/2018 increased gabapentin to  100 mg qid > improved  - 07/23/2018 try ppi just in am  07/23/2018  After extensive coaching inhaler device,  effectiveness =    90% with spacer from baseline 75%  - 09/01/2018  changed to dulera 100 due to cost   - 1/26/2021changed to dulera 200 2bid   - 03/16/2020 rec try back on dulera 100 2bid due to refractory cough  > resolved  - 04/24/2021  After extensive coaching inhaler device,  effectiveness = 100% with spacer > continue dulera 100 2 bid and gabapentin max 300 qid, min 100 bid and added pred x 6 d as PLAN D  Despite admitted less than perfect adherence, at this point  All goals of chronic asthma control met including optimal function and elimination of symptoms with minimal need for rescue therapy.  Contingencies discussed in full including contacting this office immediately if not controlling the symptoms using the rule of two's.

## 2021-10-03 NOTE — Assessment & Plan Note (Signed)
Allergy screen 06/25/2021 >  Eos 0.1 /  IgE 329   Refer back to allergy for consideration of allergy shots or biologics and if neither an option can return here for regular f/u but no need to do both         Each maintenance medication was reviewed in detail including emphasizing most importantly the difference between maintenance and prns and under what circumstances the prns are to be triggered using an action plan format where appropriate.  Total time for H and P, chart review, counseling, reviewing hfa  device(s) and generating customized AVS unique to this office visit / same day charting = 

## 2021-10-09 DIAGNOSIS — E1169 Type 2 diabetes mellitus with other specified complication: Secondary | ICD-10-CM | POA: Diagnosis not present

## 2021-10-09 DIAGNOSIS — R7302 Impaired glucose tolerance (oral): Secondary | ICD-10-CM | POA: Diagnosis not present

## 2021-10-09 DIAGNOSIS — E7800 Pure hypercholesterolemia, unspecified: Secondary | ICD-10-CM | POA: Diagnosis not present

## 2021-10-09 DIAGNOSIS — I10 Essential (primary) hypertension: Secondary | ICD-10-CM | POA: Diagnosis not present

## 2021-10-11 DIAGNOSIS — K7581 Nonalcoholic steatohepatitis (NASH): Secondary | ICD-10-CM | POA: Diagnosis not present

## 2021-10-11 DIAGNOSIS — K219 Gastro-esophageal reflux disease without esophagitis: Secondary | ICD-10-CM | POA: Diagnosis not present

## 2021-10-11 DIAGNOSIS — K5730 Diverticulosis of large intestine without perforation or abscess without bleeding: Secondary | ICD-10-CM | POA: Diagnosis not present

## 2021-10-11 DIAGNOSIS — Z1211 Encounter for screening for malignant neoplasm of colon: Secondary | ICD-10-CM | POA: Diagnosis not present

## 2021-11-13 ENCOUNTER — Ambulatory Visit: Payer: Self-pay | Admitting: Internal Medicine

## 2021-11-22 ENCOUNTER — Telehealth: Payer: Self-pay | Admitting: Internal Medicine

## 2021-11-23 MED ORDER — RABEPRAZOLE SODIUM 20 MG PO TBEC: DELAYED_RELEASE_TABLET | ORAL | 5 refills | Status: DC

## 2021-11-23 MED ORDER — GABAPENTIN 100 MG PO CAPS: 100.0000 mg | ORAL_CAPSULE | Freq: Three times a day (TID) | ORAL | 3 refills | Status: DC

## 2021-11-23 NOTE — Telephone Encounter (Signed)
Ok to refill x 1 year 

## 2021-11-23 NOTE — Telephone Encounter (Signed)
Called and spoke with patient. She is aware that the refills have been sent. Nothing further needed at time of call.

## 2021-11-23 NOTE — Telephone Encounter (Signed)
Pt is requsting refills on Aciphex and Gabapentin. Dr. Melvyn Novas me we refill these medications?

## 2021-12-11 ENCOUNTER — Ambulatory Visit (INDEPENDENT_AMBULATORY_CARE_PROVIDER_SITE_OTHER): Payer: Medicare Other | Admitting: Allergy and Immunology

## 2021-12-11 ENCOUNTER — Encounter: Payer: Self-pay | Admitting: Allergy and Immunology

## 2021-12-11 ENCOUNTER — Telehealth: Payer: Self-pay

## 2021-12-11 ENCOUNTER — Other Ambulatory Visit: Payer: Self-pay

## 2021-12-11 VITALS — BP 130/82 | HR 74 | Temp 98.1°F | Resp 16 | Ht 58.27 in | Wt 155.0 lb

## 2021-12-11 DIAGNOSIS — J3089 Other allergic rhinitis: Secondary | ICD-10-CM

## 2021-12-11 DIAGNOSIS — J4550 Severe persistent asthma, uncomplicated: Secondary | ICD-10-CM

## 2021-12-11 DIAGNOSIS — J4489 Other specified chronic obstructive pulmonary disease: Secondary | ICD-10-CM

## 2021-12-11 DIAGNOSIS — K219 Gastro-esophageal reflux disease without esophagitis: Secondary | ICD-10-CM

## 2021-12-11 MED ORDER — FLUTICASONE PROPIONATE 50 MCG/ACT NA SUSP: 1.0000 | Freq: Every day | NASAL | 5 refills | Status: DC

## 2021-12-11 MED ORDER — FAMOTIDINE 40 MG PO TABS: 40.0000 mg | ORAL_TABLET | Freq: Every day | ORAL | 5 refills | Status: DC

## 2021-12-11 MED ORDER — ALBUTEROL SULFATE HFA 108 (90 BASE) MCG/ACT IN AERS: 2.0000 | INHALATION_SPRAY | Freq: Four times a day (QID) | RESPIRATORY_TRACT | 1 refills | Status: DC | PRN

## 2021-12-11 MED ORDER — FLUTICASONE-SALMETEROL 115-21 MCG/ACT IN AERO: 2.0000 | INHALATION_SPRAY | Freq: Two times a day (BID) | RESPIRATORY_TRACT | 5 refills | Status: DC

## 2021-12-11 MED ORDER — CETIRIZINE HCL 10 MG PO TABS: 10.0000 mg | ORAL_TABLET | Freq: Every day | ORAL | 5 refills | Status: DC

## 2021-12-11 NOTE — Telephone Encounter (Signed)
I called the patient to inform her that the Flonase nasal spray was $0 if sent to [facility] and so I sent it through the mail order service to save her $10.    I left a message for the patient to call back with any questions

## 2021-12-11 NOTE — Patient Instructions (Addendum)
  1.  Allergen avoidance measures  2.  Treat and prevent inflammation of airway:   A.  Advair 115 -2 inhalations twice a day with spacer  B.  Flonase -1 spray each nostril 1 time per day  3.  Treat and prevent reflux/LPR:   A. Rabeprazole 20 mg -1 tablet in AM  B.  Famotidine 40 mg -1 tablet in p.m.  C.  Consolidate all forms of caffeine consumption  D.  Replace throat clearing with swallowing/drinking maneuver  4.  If needed:   A.  Albuterol HFA -2 inhalations or nebulization every 4-6 hours  B.  Cetirizine 10 mg -1 tablet 1 time per day  5.  Obtain fall flu vaccine and RSV vaccine  6.  Return to clinic in 4 weeks or earlier if problem

## 2021-12-11 NOTE — Progress Notes (Unsigned)
[name]   Dear [name],  Thank you for referring Sue Mitchell to the [facility] [place] on 12/11/2021.   Below is a summation of this patient's evaluation and recommendations.  Thank you for your referral. I will keep you informed about this patient's response to treatment.   If you have any questions please do not hesitate to contact me.   Sincerely,  Jessica Priest, MD Allergy / Immunology [facility] [place] [facility]   ______________________________________________________________________    NEW PATIENT NOTE  Referring Provider: Nyoka Cowden, MD Primary Provider: Renaye Rakers, MD Date of office visit: 12/11/2021    Subjective:   Chief Complaint:  Sue Mitchell (DOB: Oct 17, 1955) is a 66 y.o. female who presents to the clinic on 12/11/2021 with a chief complaint of Cough (Constant cough), Allergic Rhinitis  (Constant sneezing - tried zyrtec, Claritin, Flonase and some other allergy medication  ), and Asthma (Has shortness of breath - has an albuterol and supposed to take dulera daily it cost $100 ) .     HPI: Sue Mitchell presents to this clinic in evaluation of coughing and sneezing.  She relates a history of having a prolonged cough.  She states that the cough is in her throat.  She has spells of cough because she has dust in her throat.  This does disturb her sleep on occasion.  If she gets around a strong smell or exhaust fumes she coughs like crazy.  She has issues with sneezing.  Sometimes she will sneeze excessively.  She is occasionally has some nasal congestion but no other significant upper respiratory tract symptoms.  There is not really an obvious provoking factor giving rise to the sneeze.  She also appears to have issues with shortness of breath and wheezing.  When she uses the short acting bronchodilator it does take care of these issues.  Her  requirement for short acting bronchodilator is daily.  She has been given multiple medications in the past for her coughing and her sneezing.  She has been given [facility] but unfortunately because of the expense of this medicine she is not consistently using this inhaler.  As well, she only uses Flonase about twice a month.  She does have significant issues with reflux for which she is using Aciphex which she thinks works quite well.  She does drink 2 coffees per day usually in the morning and sometimes in the afternoon and she has a green tea as well.  She has a long history of smoking greater than 20 years which she discontinued about 11 years ago.  Past Medical History:  Diagnosis Date  . Abdominal distension   . Abdominal pain   . Arthritis   . Asthma   . Biliary colic   . COPD (chronic obstructive pulmonary disease) (HCC)   . Diarrhea   . GERD (gastroesophageal reflux disease)   . Hypertension   . Nausea     Past Surgical History:  Procedure Laterality Date  . cartilage removal  2007   left knee  . CESAREAN SECTION  1976  . LAPAROSCOPY  1992 (approx)    abdominal    Allergies as of 12/11/2021       Reactions   Codeine Hives, Itching, Other (See Comments)   All over the body, including burning sensation.   Ciprofloxacin Palpitations        Medication List  acyclovir 200 MG capsule Commonly known as: ZOVIRAX Take 200 mg by mouth 2 (two) times daily as needed (outbreaks).   albuterol 108 (90 Base) MCG/ACT inhaler Commonly known as: VENTOLIN HFA Inhale 2 puffs into the lungs every 6 (six) hours as needed for wheezing or shortness of breath.   albuterol (2.5 MG/3ML) 0.083% nebulizer solution Commonly known as: PROVENTIL Take 3 mLs (2.5 mg total) by nebulization every 6 (six) hours as needed for wheezing or shortness of breath.   amLODipine 5 MG tablet Commonly known as: NORVASC Take 1 tablet (5 mg total) by mouth daily.   Dulera 100-5 MCG/ACT Aero Generic  drug: mometasone-formoterol Inhale 2 puffs into the lungs 2 (two) times daily.   gabapentin 100 MG capsule Commonly known as: Neurontin Take 1 capsule (100 mg total) by mouth 3 (three) times daily. One three times daily   hydrochlorothiazide 12.5 MG capsule Commonly known as: MICROZIDE Take 1 capsule (12.5 mg total) by mouth daily.   RABEprazole 20 MG tablet Commonly known as: ACIPHEX TAKE 1 TABLET 30-60 MINUTES BEFORE 1ST AND LAST MEALS OF THE DAY   valsartan 40 MG tablet Commonly known as: DIOVAN Take 1 tablet by mouth daily.    Review of systems negative except as noted in HPI / PMHx or noted below:  Review of Systems  Constitutional: Negative.   HENT: Negative.    Eyes: Negative.   Respiratory: Negative.    Cardiovascular: Negative.   Gastrointestinal: Negative.   Genitourinary: Negative.   Musculoskeletal: Negative.   Skin: Negative.   Neurological: Negative.   Endo/Heme/Allergies: Negative.   Psychiatric/Behavioral: Negative.      Family History  Problem Relation Age of Onset  . Diabetes Father     Social History   Socioeconomic History  . Marital status: Divorced    Spouse name: Not on file  . Number of children: Not on file  . Years of education: Not on file  . Highest education level: Not on file  Occupational History  . Not on file  Tobacco Use  . Smoking status: Former    Packs/day: 0.50    Years: 25.00    Total pack years: 12.50    Types: Cigarettes    Quit date: 2011    Years since quitting: 12.8  . Smokeless tobacco: Never  Vaping Use  . Vaping Use: Never used  Substance and Sexual Activity  . Alcohol use: Yes    Comment: occasional 2 per month  . Drug use: No  . Sexual activity: Yes  Other Topics Concern  . Not on file  Social History Narrative  . Not on file   Environmental and Social history   Objective:   Vitals:   12/11/21 1405  BP: 130/82  Pulse: 74  Resp: 16  Temp: 98.1 F (36.7 C)  SpO2: 98%   Height: 4'  10.27" (148 cm) Weight: 155 lb (70.3 kg)  Physical Exam Constitutional:      Appearance: She is not diaphoretic.  HENT:     Head: Normocephalic.     Right Ear: Tympanic membrane, ear canal and external ear normal.     Left Ear: Tympanic membrane, ear canal and external ear normal.     Nose: Nose normal. No mucosal edema or rhinorrhea.     Mouth/Throat:     Pharynx: Uvula midline. No oropharyngeal exudate.  Eyes:     Conjunctiva/sclera: Conjunctivae normal.  Neck:     Thyroid: No thyromegaly.     Trachea: Trachea normal.  No tracheal tenderness or tracheal deviation.  Cardiovascular:     Rate and Rhythm: Normal rate and regular rhythm.     Heart sounds: Normal heart sounds, S1 normal and S2 normal. No murmur heard. Pulmonary:     Effort: No respiratory distress.     Breath sounds: Normal breath sounds. No stridor. No wheezing or rales.  Lymphadenopathy:     Head:     Right side of head: No tonsillar adenopathy.     Left side of head: No tonsillar adenopathy.     Cervical: No cervical adenopathy.  Skin:    Findings: No erythema or rash.     Nails: There is no clubbing.  Neurological:     Mental Status: She is alert.    Diagnostics: Allergy skin tests were performed.   Spirometry was performed and demonstrated an FEV1 of 0.84 @ 59 % of predicted. FEV1/FVC = 0.62  The patient had an Asthma Control Test with the following results:  .    Results of blood tests obtained 25 Jun 2021 identifies WBC 7.9, absolute eosinophil 100, absolute lymphocyte 2100, hemoglobin 13.8, platelet 222, IgE 329 KU/L  Results of blood tests obtained 04 December 2017 identified IgE antibodies directed against dermatophagoides pteronyssinus at a titer of 0.17 KU/L with negative antibody titers directed against a aero allergen IgE profile.  Results of pulmonary function test obtained 04 December 2017 identified TLC 109% of predicted, RV 147% of predicted, DL/VA 80% predicted.  Results of CT angio chest  obtained 11 April 2018 identified the following:  Cardiovascular: There is respiratory motion on the study and suboptimal opacification of segmental and subsegmental pulmonary artery branches. No obvious pulmonary embolism. Central pulmonary arteries are normal in caliber. The thoracic aorta is normal in caliber. The heart size is normal. No pericardial fluid identified. No significant calcified coronary artery plaque identified.   Mediastinum/Nodes: No enlarged mediastinal, hilar, or axillary lymph nodes. The thyroid gland may be mildly enlarged.   Lungs/Pleura: Detailed evaluation of the lungs is impaired by respiratory motion. There is no obvious evidence of pulmonary edema, consolidation, pneumothorax, nodule or pleural fluid.  Results of a sinus CT scan obtained 07 April 2018 identified the following:  Visible mastoid air cells are clear.   Visible bilateral paranasal sinuses are well pneumatized with no bubbly opacity or sinus fluid level evident.   Negative nasal cavity; leftward nasal septal deviation and incidental bilateral concha bullosa.   No acute osseous abnormality identified.   Assessment and Plan:    No diagnosis found.  Patient Instructions   1.  Allergen avoidance measures  2.  Treat and prevent inflammation of airway:   A.  Advair 115 -2 inhalations twice a day with spacer  B.  Flonase -1 spray each nostril 1 time per day  3.  Treat and prevent reflux/LPR:   A.   4.   5.   6.   7.   8.   Jiles Prows, MD Allergy / Immunology [facility] [place]

## 2021-12-12 ENCOUNTER — Encounter: Payer: Self-pay | Admitting: Allergy and Immunology

## 2021-12-26 DIAGNOSIS — K5730 Diverticulosis of large intestine without perforation or abscess without bleeding: Secondary | ICD-10-CM | POA: Diagnosis not present

## 2021-12-26 DIAGNOSIS — Z1211 Encounter for screening for malignant neoplasm of colon: Secondary | ICD-10-CM | POA: Diagnosis not present

## 2022-01-02 ENCOUNTER — Ambulatory Visit: Payer: Medicare Other | Admitting: Internal Medicine

## 2022-01-06 NOTE — Progress Notes (Unsigned)
Subjective:    Patient ID: Sue Mitchell, female   DOB: 08/23/1955     MRN: [id number]   Brief patient profile:  70  yobf  RT quit smoking 2011 @ wt 140  with h/o sinus symptoms assoc with cough and need for saba prn as teenager while living in [place] but after arrival here at age late 22's it changed to point where bothered her mostly with onset of cold weather typically req ov rx  abx and prednisone help a lot and this pattern continued even after quit smoking to where the episodes last longer and onset of this not directed linked to cold weather in fact  had one April 2018 never  Completely cleared  then flared again in Oct 01 2016 severe dry cough / eval by allergist /ent since onset neg w/u so referred by Dr Blenda Nicely to pulmonary clinic 12/26/16     History of Present Illness  12/26/2016 1st [facility] office visit/ Sue Mitchell   Chief Complaint  Patient presents with   Pulm Consult    Pt referred by Dr. Lind Guest ENT. Pt has productive cough-clear mucus sometimes yellow. Pt had horseness in voice for 6-8 weeks, no chills or fever, have some chest pain and tightness with cough.  recurrent cough since April 2018 never resolved p rx as uri then flaired early August 2018 rx zpak/pred/saba> some better  Cough was worse at hs now   Am feels wheezy not really coughing much up but what she does is white and < 1 tbsp Cough seems worse with exp to cold air at work  Assoc overt hb better on zantac  Some gen ant chest discomfort with severe coughing fits  rec Plan A = Automatic =  symbicort 80 Take 2 puffs first thing in am and then another 2 puffs about 12 hours later.  Plan B = Backup Only use your albuterol as a rescue medication  Start zantac 150- 300 mg after bfast and after supper until cough better  GERD diet      02/20/18 NP ov rec Augmentin/ pred/ antihistamine/singulair > no better and could not take singulair / did not try zyrtec in am "makes me sleepy"       04/24/2021  f/u ov/Sue Mitchell re: cough since her 30's / ? cough variant asthma plus uacs  maint on aciphex  20 mg ac qd Chief Complaint  Patient presents with   Follow-up    Follow up. Patient says she's been coughing some the past couple weeks but everything else is going pretty good.   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   Plan B = Backup (to supplement plan A, not to replace it) Only use your albuterol inhaler as a rescue medication  Plan C = Crisis (instead of Plan B but only if Plan B stops working) - only use  your albuterol nebulizer if you first try Plan B and it fails to help  Plan D = Deltasone = prednisone if needing more and more C Prednisone 10 mg take  4 each am x 2 days,   2 each am x 2 days,  1 each am x 2 days and stop   Please schedule a follow up visit in 3 months but call sooner if needed    06/25/2021  f/u ov/Sue Mitchell re: cough variant asthma vs UACS/vcd  not consistently on maint rx  - stopped dulera (cost) and singulair (didn't think it worked)  worse since one week sneezing/ nasal congestion / has not used plan D  Chief Complaint  Patient presents with   Follow-up  Dyspnea:  working 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   Plan B = Backup (to supplement plan A, not to replace it) Only use your albuterol inhaler as a rescue medication  Plan C = Crisis (instead of Plan B but only if Plan B stops working) - only use your albuterol nebulizer if you first try Plan B  Plan D = Deltasone = prednisone if needing more and more C Prednisone 10 mg take  4 each am x 2 days,   2 each am x 2 days,  1 each am x 2 days and stop   Allergy screen 06/25/2021 >  Eos 0.1 /  IgE 329       10/02/2021  f/u ov/Sue Mitchell re: cough variant asthma vs vcd  maint on dulera 100  / gabapentin prn / admits not really adherent to concept of "maint meds"  Chief Complaint  Patient presents with   Follow-up    Pt states she has some wheezing, sneezing and SOB in the mornings. Pt states she has a new cough that started x1 week ago  Dyspnea:  mornings are difficult but variable  Cough: variable also s pattern  Sleeping: does fine  SABA use: rarely  02: none  Covid status:   vaccinated all  and likely the original  Rec Plan A = Automatic = Always=    dulera 100 Take 2 puffs first thing in am and then another 2 puffs about 12 hours later.   Plan B = Backup (to supplement plan A, not to replace it) Only use your albuterol inhaler as a rescue medication Plan C = Crisis (instead of Plan B but only if Plan B stops working) - only use your albuterol nebulizer if you first try Plan B  We will be referring you to Dr Bruna Potter group for allergy eval > added zyrtec / flonase    01/07/2022  f/u ov/Sue Mitchell re: cough variant asthma   maint on no respiratory meds /just albuterol prn  Chief Complaint  Patient presents with   Follow-up    Cough and chest congestion in mornings x 5 days.  Wheezing this morning.  Not using ICS inhaler due to money.  Dyspnea:  Not limited by breathing from desired activities   Cough: 1st thing in am  worse off  symbicort and can't afford it or dulera or even advair  Sleeping: bed is flat, head propped up on pills  SABA use: 3-4 x per week       No obvious day to day or daytime variability or assoc excess/ purulent sputum or mucus plugs or hemoptysis or cp or chest tightness, subjective wheeze or overt sinus or hb symptoms.   *** without nocturnal  or early am exacerbation  of respiratory  c/o's or need for noct saba. Also denies any obvious fluctuation of symptoms with weather or environmental changes or other aggravating or alleviating factors except as outlined above   No unusual exposure hx or h/o childhood pna/ asthma or knowledge of premature birth.  Current Allergies, Complete Past Medical History, Past Surgical History, Family History, and Social History were reviewed in [employer] record.  ROS  The following are not active complaints unless bolded Hoarseness, sore throat, dysphagia, dental problems, itching, sneezing,  nasal congestion or discharge of excess mucus or purulent secretions, ear ache,   fever, chills, sweats, unintended wt loss or wt gain, classically pleuritic or exertional cp,  orthopnea pnd or arm/hand swelling  or leg swelling, presyncope, palpitations, abdominal pain, anorexia, nausea, vomiting, diarrhea  or change in bowel habits or change in bladder habits, change in stools or change in urine, dysuria, hematuria,  rash, arthralgias, visual complaints, headache, numbness, weakness or ataxia or problems with walking or coordination,  change in mood or  memory.        Current Meds  Medication Sig   acyclovir (ZOVIRAX) 200 MG capsule Take 200 mg by mouth 2 (two) times daily as needed (outbreaks).   albuterol (PROVENTIL) (2.5 MG/3ML) 0.083% nebulizer solution Take 3 mLs (2.5 mg total) by nebulization every 6 (six) hours as needed for wheezing or shortness of breath.   albuterol (VENTOLIN HFA) 108 (90 Base) MCG/ACT inhaler Inhale 2 puffs into the lungs  every 6 (six) hours as needed for wheezing or shortness of breath.   albuterol (VENTOLIN HFA) 108 (90 Base) MCG/ACT inhaler Inhale 2 puffs into the lungs every 6 (six) hours as needed for wheezing or shortness of breath.   amLODipine (NORVASC) 5 MG tablet Take 1 tablet (5 mg total) by mouth daily.   cetirizine (ZYRTEC) 10 MG tablet Take 1 tablet (10 mg total) by mouth daily.   DULERA 100-5 MCG/ACT AERO Inhale 2 puffs into the lungs 2 (two) times daily.   famotidine (PEPCID) 40 MG tablet Take 1 tablet (40 mg total) by mouth daily.   fluticasone (FLONASE) 50 MCG/ACT nasal spray Place 1 spray into both nostrils daily.   gabapentin (NEURONTIN) 100 MG capsule Take 1 capsule (100 mg total) by mouth 3 (three) times daily. One three times daily   hydrochlorothiazide (MICROZIDE) 12.5 MG capsule Take 1 capsule (12.5 mg total) by mouth daily.   RABEprazole (ACIPHEX) 20 MG tablet TAKE 1 TABLET 30-60 MINUTES BEFORE 1ST AND LAST MEALS OF THE DAY   valsartan (DIOVAN) 40 MG tablet Take 1 tablet by mouth daily.                     Objective:   Physical Exam   01/07/2022       ***  10/02/2021          150  06/25/2021          154 04/24/2021        155   09/27/2020  156  03/16/2020        175 03/01/2020           171 07/19/2019        173  01/13/2019      175  10/21/2018        172  09/01/2018          169 07/23/2018        170  05/04/2018         170  04/20/2018       169  04/08/2018       164  03/26/2018       173  03/11/2018       173  12/04/2017     170  11/04/2017       169  10/21/2017       169  02/06/2017     171   12/26/16 171 lb 6.4 oz (77.7 kg)  11/18/13 170 lb (77.1 kg)  03/21/11 171 lb (77.6 kg)     Mitchell signs reviewed  01/07/2022  - Note at rest 02 sats  ***% on ***   General appearance:    ***     Min bar***         Assessment:

## 2022-01-07 ENCOUNTER — Ambulatory Visit: Payer: Medicare Other | Admitting: Internal Medicine

## 2022-01-07 ENCOUNTER — Encounter: Payer: Self-pay | Admitting: Internal Medicine

## 2022-01-07 VITALS — BP 114/68 | HR 69 | Temp 98.7°F | Ht 59.0 in | Wt 153.4 lb

## 2022-01-07 DIAGNOSIS — J45991 Cough variant asthma: Secondary | ICD-10-CM | POA: Diagnosis not present

## 2022-01-07 DIAGNOSIS — Z87891 Personal history of nicotine dependence: Secondary | ICD-10-CM

## 2022-01-07 MED ORDER — PREDNISONE 10 MG PO TABS: ORAL_TABLET | ORAL | 0 refills | Status: DC

## 2022-01-07 NOTE — Patient Instructions (Signed)
Prednisone 10 mg take  4 each am x 2 days,   2 each am x 2 days,  1 each am x 2 days and stop   As soon as afford your asthma medications, please restart them   Pulmonary follow up is as needed

## 2022-01-08 ENCOUNTER — Encounter: Payer: Self-pay | Admitting: Internal Medicine

## 2022-01-08 NOTE — Assessment & Plan Note (Addendum)
Referred for lung cancer screening 01/07/2022    Low-dose CT lung cancer screening is recommended for patients who are 68-66 years of age with a 20+ pack-year history of smoking and who are currently smoking or quit <=15 years ago. No coughing up blood  No unintentional weight loss of > 15 pounds in the last 6 months - pt is eligible for scanning yearly until 2026  > referred

## 2022-01-08 NOTE — Assessment & Plan Note (Signed)
Quit smoking 2011  Recurrent cough since teenager really bad and daily since Aug 2018 12/26/2016  rec symbicort 80 2bid then return for full pfts - PFT's  02/06/2017  FEV1 1.37 (80 % ) ratio 67  p 5 % improvement from saba p symb 80  prior to study with DLCO  66/66c % corrects to 79  % for alv volume   - Spirometry 10/21/2017  FEV1 1.18 (70%)  Ratio 71 with min curvature during attack of cough/ mostly pseudowheeze on exam on symb 80 x2  - FENO 10/21/2017  =   7  On symb 80 x 2  - 11/04/2017     90% with spacer  - singulair added 11/04/2017  But not taking consistently as of 12/04/2017 > rec restart daily  X one week and stopped it due "dizzy"  - PFT's  12/04/2017  FEV1 1.34 (85 % ) ratio 67  p 20 % improvement from saba p nothing prior to study with DLCO  71 % corrects to 80  % for alv volume   - Allergy profile 12/04/2017 >  Eos 0.0 /  IgE  546 dust only  - cyclical cough rx 03/11/2018  - Spirometry 03/26/2018  FEV1 1.1 (73%)  Ratio 0.64 - 03/26/2018  After extensive coaching inhaler device,  effectiveness =    90% s spacer  - Gabapentin 100 mg tid trial 03/26/2018> attempted rx by non-adherent  - Sinus CT 04/07/2018 > Negative study.  No evidence of paranasal sinusitis. - Spirometry 04/08/2018  FEV1 0.7 (47%)  Ratio 0.52 with concave curvature p am symb 80 x 2 and neb in office   - 04/08/2018  After extensive coaching inhaler device,  effectiveness =    75% with smi > added trial of spiriva 2pffs daily x 2 weeks then return  - CTa 04/11/18 for hemoptysis with refractory cough > Limited study due to respiratory motion. No obvious pulmonary embolism or other acute findings. - 04/20/2018  After extensive coaching inhaler device,  effectiveness =    0% with spiriva smi which caused immediate severe cough so rec d/c spiriva  - 04/20/2018 rechallenge with gabapentin 100 mg bid  - 05/04/2018  After extensive coaching inhaler device,  effectiveness =    90% with spacer - s spacer starts coughing immediately  -  Spirometry 05/04/2018  FEV1 1.2 (75%)  Ratio 0.65 with mild curvature p symb 80 x 2 puffs   - 05/04/2018 increased gabapentin to 100 tid   - 05/25/2018 increased gabapentin to  100 mg qid > improved  - 07/23/2018 try ppi just in am  07/23/2018  After extensive coaching inhaler device,  effectiveness =    90% with spacer from baseline 75%  - 09/01/2018  changed to dulera 100 due to cost   - 1/26/2021changed to dulera 200 2bid   - 03/16/2020 rec try back on dulera 100 2bid due to refractory cough  > resolved  - 04/24/2021  After extensive coaching inhaler device,  effectiveness = 100% with spacer > continue dulera 100 2 bid and gabapentin max 300 qid, min 100 bid and added pred x 6 d as PLAN D  Now under the care of Dr Lucie Leather with issues with adherence due to insurance restrictions and funds so f/u here can be prn to simply care/ reduce cost  Advised if no other options to use saba prn and pred x 6 days for flares and f/u with Dr Georga Bora and here prn  Each maintenance medication was reviewed in detail including emphasizing most importantly the difference between maintenance and prns and under what circumstances the prns are to be triggered using an action plan format where appropriate.  Total time for H and P, chart review, counseling, reviewing hfa device(s) and generating customized AVS unique to this office visit / same day charting = 25 min

## 2022-01-15 ENCOUNTER — Encounter: Payer: Self-pay | Admitting: Allergy and Immunology

## 2022-01-15 ENCOUNTER — Ambulatory Visit (INDEPENDENT_AMBULATORY_CARE_PROVIDER_SITE_OTHER): Payer: Medicare Other | Admitting: Allergy and Immunology

## 2022-01-15 VITALS — BP 132/68 | HR 68 | Temp 97.8°F | Resp 16

## 2022-01-15 DIAGNOSIS — J4550 Severe persistent asthma, uncomplicated: Secondary | ICD-10-CM | POA: Diagnosis not present

## 2022-01-15 DIAGNOSIS — K219 Gastro-esophageal reflux disease without esophagitis: Secondary | ICD-10-CM | POA: Diagnosis not present

## 2022-01-15 DIAGNOSIS — J4489 Other specified chronic obstructive pulmonary disease: Secondary | ICD-10-CM | POA: Diagnosis not present

## 2022-01-15 DIAGNOSIS — J3089 Other allergic rhinitis: Secondary | ICD-10-CM

## 2022-01-15 MED ORDER — CETIRIZINE HCL 10 MG PO TABS: 10.0000 mg | ORAL_TABLET | Freq: Every day | ORAL | 5 refills | Status: DC

## 2022-01-15 MED ORDER — ALBUTEROL SULFATE HFA 108 (90 BASE) MCG/ACT IN AERS: 2.0000 | INHALATION_SPRAY | Freq: Four times a day (QID) | RESPIRATORY_TRACT | 1 refills | Status: DC | PRN

## 2022-01-15 MED ORDER — FAMOTIDINE 40 MG PO TABS: 40.0000 mg | ORAL_TABLET | Freq: Every evening | ORAL | 5 refills | Status: DC

## 2022-01-15 MED ORDER — BREZTRI AEROSPHERE 160-9-4.8 MCG/ACT IN AERO: 2.0000 | INHALATION_SPRAY | Freq: Two times a day (BID) | RESPIRATORY_TRACT | 5 refills | Status: DC

## 2022-01-15 MED ORDER — FLUTICASONE PROPIONATE 50 MCG/ACT NA SUSP: 1.0000 | Freq: Every day | NASAL | 5 refills | Status: DC

## 2022-01-15 MED ORDER — SPACER/AERO-HOLDING CHAMBERS DEVI: 1.0000 | 1 refills | Status: DC

## 2022-01-15 NOTE — Progress Notes (Signed)
[facility] - [place] - [place] - [place]   Follow-up Note  Referring Provider: Renaye Rakers, MD Primary Provider: Renaye Rakers, MD Date of Office Visit: 01/15/2022  Subjective:   Sue Mitchell (DOB: 03-Dec-1955) is a 66 y.o. female who [facility] in re-evaluation of the following:  HPI: Sue Mitchell returns to this clinic in reevaluation of asthma/COPD overlap, history of extensive tobacco use, allergic rhinitis, LPR.  I last saw her in this clinic during her initial evaluation of 11 December 2021.    She unfortunately could not afford her controller inhalers which has been a significant problem over the course of the past year or so.  Because she could not use her inhalers she ended up developing some more problems with wheezing and coughing and congestion in her chest and she visited with Dr. [name], pulmonology, on 07 January 2022 and received a 6-day course of prednisone which did help her chest issues.  She does not need to use a short acting bronchodilator at this point.  Her nose is doing very well.  She has been intermittently using Flonase.  She has performed house dust mite avoidance measures with inside the bedroom and has placed her pillows in a dryer every week.  Her reflux is much better on her current plan.  She continues on a proton pump inhibitor and H2 receptor blocker also decreased her caffeine consumption.  She has received the flu vaccine and the RSV vaccine.  Allergies as of 01/15/2022       Reactions   Codeine Hives, Itching, Other (See Comments)   All over the body, including burning sensation.   Ciprofloxacin Palpitations        Medication List    acyclovir 200 MG capsule Commonly known as: ZOVIRAX Take 200 mg by mouth 2 (two) times daily as needed (outbreaks).   albuterol 108 (90 Base) MCG/ACT inhaler Commonly known as: VENTOLIN HFA Inhale 2 puffs into the lungs every 6 (six) hours as  needed for wheezing or shortness of breath.   albuterol (2.5 MG/3ML) 0.083% nebulizer solution Commonly known as: PROVENTIL Take 3 mLs (2.5 mg total) by nebulization every 6 (six) hours as needed for wheezing or shortness of breath.   albuterol 108 (90 Base) MCG/ACT inhaler Commonly known as: Ventolin HFA Inhale 2 puffs into the lungs every 6 (six) hours as needed for wheezing or shortness of breath.   amLODipine 5 MG tablet Commonly known as: NORVASC Take 1 tablet (5 mg total) by mouth daily.   cetirizine 10 MG tablet Commonly known as: ZYRTEC Take 1 tablet (10 mg total) by mouth daily.   famotidine 40 MG tablet Commonly known as: PEPCID Take 1 tablet (40 mg total) by mouth daily.   fluticasone 50 MCG/ACT nasal spray Commonly known as: FLONASE Place 1 spray into both nostrils daily.   gabapentin 100 MG capsule Commonly known as: Neurontin Take 1 capsule (100 mg total) by mouth 3 (three) times daily. One three times daily   hydrochlorothiazide 12.5 MG capsule Commonly known as: MICROZIDE Take 1 capsule (12.5 mg total) by mouth daily.   RABEprazole 20 MG tablet Commonly known as: ACIPHEX TAKE 1 TABLET 30-60 MINUTES BEFORE 1ST AND LAST MEALS OF THE DAY   valsartan 40 MG tablet Commonly known as: DIOVAN Take 1 tablet by mouth daily.    Past Medical History:  Diagnosis Date   Abdominal distension    Abdominal pain    Arthritis  Asthma    Biliary colic    COPD (chronic obstructive pulmonary disease) (HCC)    Diarrhea    GERD (gastroesophageal reflux disease)    Hypertension    Nausea     Past Surgical History:  Procedure Laterality Date   cartilage removal  2007   left knee   CESAREAN SECTION  1976   LAPAROSCOPY  1992 (approx)    abdominal    Review of systems negative except as noted in HPI / PMHx or noted below:  Review of Systems  Constitutional: Negative.   HENT: Negative.    Eyes: Negative.   Respiratory: Negative.    Cardiovascular:  Negative.   Gastrointestinal: Negative.   Genitourinary: Negative.   Musculoskeletal: Negative.   Skin: Negative.   Neurological: Negative.   Endo/Heme/Allergies: Negative.   Psychiatric/Behavioral: Negative.       Objective:   Vitals:   01/15/22 0847  BP: 132/68  Pulse: 68  Resp: 16  Temp: 97.8 F (36.6 C)  SpO2: 98%          Physical Exam Constitutional:      Appearance: She is not diaphoretic.  HENT:     Head: Normocephalic.     Right Ear: Tympanic membrane, ear canal and external ear normal.     Left Ear: Tympanic membrane, ear canal and external ear normal.     Nose: Nose normal. No mucosal edema or rhinorrhea.     Mouth/Throat:     Pharynx: Uvula midline. No oropharyngeal exudate.  Eyes:     Conjunctiva/sclera: Conjunctivae normal.  Neck:     Thyroid: No thyromegaly.     Trachea: Trachea normal. No tracheal tenderness or tracheal deviation.  Cardiovascular:     Rate and Rhythm: Normal rate and regular rhythm.     Heart sounds: Normal heart sounds, S1 normal and S2 normal. No murmur heard. Pulmonary:     Effort: No respiratory distress.     Breath sounds: Normal breath sounds. No stridor. No wheezing or rales.  Lymphadenopathy:     Head:     Right side of head: No tonsillar adenopathy.     Left side of head: No tonsillar adenopathy.     Cervical: No cervical adenopathy.  Skin:    Findings: No erythema or rash.     Nails: There is no clubbing.  Neurological:     Mental Status: She is alert.     Diagnostics: none  Assessment and Plan:   1. Not well controlled severe persistent asthma   2. COPD with asthma   3. Perennial allergic rhinitis   4. LPRD (laryngopharyngeal reflux disease)    1.  Allergen avoidance measures - dust mite  2.  Treat and prevent inflammation of airway:   A.  BREZTRI -2 inhalations twice a day with spacer (empty lungs)  B.  Flonase -1 spray each nostril 3-7 times per week  3.  Treat and prevent reflux/LPR:   A.   Rabeprazole 20 mg -1 tablet in AM  B.  Famotidine 40 mg -1 tablet in p.m.  C.  Consolidate all forms of caffeine consumption  D.  Replace throat clearing with swallowing/drinking maneuver  4.  If needed:   A.  Albuterol HFA -2 inhalations or nebulization every 4-6 hours  B.  Cetirizine 10 mg -1 tablet 1 time per day  5.  Return to clinic in 8 weeks or earlier if problem  Iridian will use samples of Breztri and we will try and get her  a prescription that her insurance company will cover to address the inflammation in her lower airway in a preventative manner.  She will continue to address the issue with LPR with the treatment noted above.  I will see her back in this clinic in 8 weeks or earlier if there is a problem.  Laurette Schimke, MD Allergy / Immunology [facility]

## 2022-01-15 NOTE — Patient Instructions (Addendum)
  1.  Allergen avoidance measures - dust mite  2.  Treat and prevent inflammation of airway:   A.  BREZTRI -2 inhalations twice a day with spacer (empty lungs)  B.  Flonase -1 spray each nostril 3-7 times per week  3.  Treat and prevent reflux/LPR:   A.  Rabeprazole 20 mg -1 tablet in AM  B.  Famotidine 40 mg -1 tablet in p.m.  C.  Consolidate all forms of caffeine consumption  D.  Replace throat clearing with swallowing/drinking maneuver  4.  If needed:   A.  Albuterol HFA -2 inhalations or nebulization every 4-6 hours  B.  Cetirizine 10 mg -1 tablet 1 time per day  5.  Return to clinic in 8 weeks or earlier if problem

## 2022-01-16 ENCOUNTER — Encounter: Payer: Self-pay | Admitting: Allergy and Immunology

## 2022-01-25 ENCOUNTER — Telehealth: Payer: Self-pay | Admitting: Internal Medicine

## 2022-01-25 NOTE — Telephone Encounter (Signed)
Amy you were the nurse working with Dr. [name] this day. Did you fax this paperwork?

## 2022-01-28 DIAGNOSIS — I10 Essential (primary) hypertension: Secondary | ICD-10-CM | POA: Diagnosis not present

## 2022-01-28 DIAGNOSIS — Z0000 Encounter for general adult medical examination without abnormal findings: Secondary | ICD-10-CM | POA: Diagnosis not present

## 2022-01-28 NOTE — Telephone Encounter (Signed)
Called pt to let her know that Dr. [name] has the forms and will get them to [facility] and [facility] to be faxed to [facility].  It is for [employer] purposes, pt states similar to [employer].

## 2022-01-28 NOTE — Telephone Encounter (Signed)
Called patient.  She states she gave the forms directly to Dr. [name] during her OV on 01/07/22 to be filled out and then faxed to [employer]	[place].  I will do some research to see if this has been faxed.  I do not recall ever seeing this paperwork.

## 2022-01-31 ENCOUNTER — Other Ambulatory Visit: Payer: Self-pay | Admitting: Family Medicine

## 2022-01-31 DIAGNOSIS — Z87891 Personal history of nicotine dependence: Secondary | ICD-10-CM

## 2022-02-01 NOTE — Telephone Encounter (Signed)
This encounter was created in error - please disregard.

## 2022-02-01 NOTE — Progress Notes (Signed)
Entered in error. Please discard.

## 2022-02-06 ENCOUNTER — Ambulatory Visit
Admission: RE | Admit: 2022-02-06 | Discharge: 2022-02-06 | Disposition: A | Discharge status: Home / Self Care | Payer: Medicare Other | Source: Ambulatory Visit | Attending: Family Medicine | Admitting: Family Medicine

## 2022-02-06 ENCOUNTER — Telehealth: Payer: Self-pay | Admitting: Internal Medicine

## 2022-02-06 DIAGNOSIS — Z87891 Personal history of nicotine dependence: Secondary | ICD-10-CM

## 2022-02-06 DIAGNOSIS — Z0289 Encounter for other administrative examinations: Secondary | ICD-10-CM

## 2022-02-06 NOTE — Telephone Encounter (Signed)
Dr. [name] signed the disability form on 01/28/22, but he left some items blank.  I researched previous disability forms submitted and completed the form.  Faxed it to [employer]. Fax# [phone].   Mailed a hard copy to the patient.  Applied $29 processing fee to account.

## 2022-02-13 ENCOUNTER — Other Ambulatory Visit: Payer: Self-pay

## 2022-02-13 MED ORDER — BREZTRI AEROSPHERE 160-9-4.8 MCG/ACT IN AERO: INHALATION_SPRAY | RESPIRATORY_TRACT | 1 refills | Status: DC

## 2022-02-13 MED ORDER — FAMOTIDINE 40 MG PO TABS: 40.0000 mg | ORAL_TABLET | Freq: Every evening | ORAL | 1 refills | Status: DC

## 2022-02-13 MED ORDER — ALBUTEROL SULFATE HFA 108 (90 BASE) MCG/ACT IN AERS: INHALATION_SPRAY | RESPIRATORY_TRACT | 1 refills | Status: DC

## 2022-02-14 ENCOUNTER — Other Ambulatory Visit: Payer: Self-pay

## 2022-02-14 MED ORDER — ALBUTEROL SULFATE HFA 108 (90 BASE) MCG/ACT IN AERS: INHALATION_SPRAY | RESPIRATORY_TRACT | 1 refills | Status: DC

## 2022-02-22 ENCOUNTER — Other Ambulatory Visit: Payer: Self-pay | Admitting: Allergy and Immunology

## 2022-03-26 ENCOUNTER — Ambulatory Visit: Payer: Medicare Other | Admitting: Allergy and Immunology

## 2022-04-02 ENCOUNTER — Ambulatory Visit: Payer: Medicare Other | Admitting: Allergy and Immunology

## 2022-04-09 ENCOUNTER — Other Ambulatory Visit: Payer: Self-pay | Admitting: Allergy and Immunology

## 2022-04-29 DIAGNOSIS — I10 Essential (primary) hypertension: Secondary | ICD-10-CM | POA: Diagnosis not present

## 2022-04-29 DIAGNOSIS — E1169 Type 2 diabetes mellitus with other specified complication: Secondary | ICD-10-CM | POA: Diagnosis not present

## 2022-04-29 DIAGNOSIS — E786 Lipoprotein deficiency: Secondary | ICD-10-CM | POA: Diagnosis not present

## 2022-04-30 ENCOUNTER — Other Ambulatory Visit: Payer: Self-pay | Admitting: *Deleted

## 2022-04-30 ENCOUNTER — Ambulatory Visit: Payer: Medicare Other | Admitting: Allergy and Immunology

## 2022-04-30 VITALS — BP 122/68 | HR 73 | Temp 98.1°F | Resp 18 | Ht 59.0 in | Wt 151.2 lb

## 2022-04-30 DIAGNOSIS — K219 Gastro-esophageal reflux disease without esophagitis: Secondary | ICD-10-CM

## 2022-04-30 DIAGNOSIS — J4489 Other specified chronic obstructive pulmonary disease: Secondary | ICD-10-CM

## 2022-04-30 DIAGNOSIS — J3089 Other allergic rhinitis: Secondary | ICD-10-CM

## 2022-04-30 DIAGNOSIS — J4550 Severe persistent asthma, uncomplicated: Secondary | ICD-10-CM

## 2022-04-30 MED ORDER — BREZTRI AEROSPHERE 160-9-4.8 MCG/ACT IN AERO: 2.0000 | INHALATION_SPRAY | Freq: Two times a day (BID) | RESPIRATORY_TRACT | 1 refills | Status: DC

## 2022-04-30 MED ORDER — ALBUTEROL SULFATE (2.5 MG/3ML) 0.083% IN NEBU: 2.5000 mg | INHALATION_SOLUTION | Freq: Four times a day (QID) | RESPIRATORY_TRACT | 1 refills | Status: DC | PRN

## 2022-04-30 MED ORDER — FAMOTIDINE 40 MG PO TABS: 40.0000 mg | ORAL_TABLET | Freq: Every day | ORAL | 1 refills | Status: DC

## 2022-04-30 MED ORDER — RABEPRAZOLE SODIUM 20 MG PO TBEC: 20.0000 mg | DELAYED_RELEASE_TABLET | Freq: Every morning | ORAL | 1 refills | Status: DC

## 2022-04-30 MED ORDER — FLUTICASONE PROPIONATE 50 MCG/ACT NA SUSP: 1.0000 | Freq: Every day | NASAL | 1 refills | Status: DC

## 2022-04-30 MED ORDER — CETIRIZINE HCL 10 MG PO TABS: 10.0000 mg | ORAL_TABLET | Freq: Every day | ORAL | 5 refills | Status: DC | PRN

## 2022-04-30 MED ORDER — ALBUTEROL SULFATE HFA 108 (90 BASE) MCG/ACT IN AERS: 2.0000 | INHALATION_SPRAY | RESPIRATORY_TRACT | 1 refills | Status: DC | PRN

## 2022-04-30 NOTE — Patient Instructions (Addendum)
  1.  Allergen avoidance measures - dust mite  2.  Treat and prevent inflammation of airway:   A.  BREZTRI -2 inhalations 1-2 times per day with spacer (empty lungs)  B.  Flonase -1 spray each nostril 3-7 times per week  3.  Treat and prevent reflux/LPR:   A.  Rabeprazole 20 mg -1 tablet in AM  B.  Famotidine 40 mg -1 tablet in p.m if needed  C.  Consolidate all forms of caffeine consumption  D.  Replace throat clearing with swallowing/drinking maneuver  4.  If needed:   A.  Albuterol HFA -2 inhalations or nebulization every 4-6 hours  B.  Cetirizine 10 mg -1 tablet 1 time per day  5.  Return to clinic in Summer 2024 or earlier if problem

## 2022-04-30 NOTE — Progress Notes (Signed)
[place]   Follow-up Note  Referring Provider: Lucianne Lei, MD Primary Provider: Lucianne Lei, MD Date of Office Visit: 04/30/2022  Subjective:   Sue Mitchell (DOB: January 13, 1956) is a 67 y.o. female who [facility] on 04/30/2022 in re-evaluation of the following:  HPI: Sue Mitchell returns to this clinic in evaluation of asthma/COPD overlap, allergic rhinitis, LPR.  I last saw her in this clinic 15 January 2022.  She believes that she is doing great.  She has had very good breathing and she does not use a short acting bronchodilator and she can exert herself to the extent that she so desires without any problem and she has not required a systemic steroid or antibiotic for any type of airway issue while she consistently uses Breztri, a nasal steroid a few times a week, and aggressive therapy directed against LPR.  Allergies as of 04/30/2022       Reactions   Codeine Hives, Itching, Other (See Comments)   All over the body, including burning sensation.   Ciprofloxacin Palpitations        Medication List    acyclovir 200 MG capsule Commonly known as: ZOVIRAX Take 200 mg by mouth 2 (two) times daily as needed (outbreaks).   albuterol (2.5 MG/3ML) 0.083% nebulizer solution Commonly known as: PROVENTIL Take 3 mLs (2.5 mg total) by nebulization every 6 (six) hours as needed for wheezing or shortness of breath.   albuterol 108 (90 Base) MCG/ACT inhaler Commonly known as: ProAir HFA Can inhale two puffs every four to six hours as needed for cough or wheeze.   amLODipine 5 MG tablet Commonly known as: NORVASC Take 1 tablet (5 mg total) by mouth daily.   Arexvy 120 MCG/0.5ML injection Generic drug: RSV vaccine recomb adjuvanted Inject 0.5 mLs into the muscle once.   ascorbic acid 1000 MG tablet Commonly known as: VITAMIN C Take 1,000 mg by mouth daily.   aspirin EC 81 MG tablet Take 81 mg by  mouth daily.   B Complex Vitamins (w/ FA) Caps Take 1 capsule by mouth daily.   Breztri Aerosphere 160-9-4.8 MCG/ACT Aero Generic drug: Budeson-Glycopyrrol-Formoterol Inhale two puffs with spacer twice daily to prevent cough or wheeze.  Rinse, gargle, and spit after use.   cetirizine 10 MG tablet Commonly known as: ZYRTEC Take 1 tablet (10 mg total) by mouth daily.   Clenpiq 10-3.5-12 MG-GM -GM/175ML Soln Generic drug: Sod Picosulfate-Mag Ox-Cit Acd Take 350 mLs by mouth 2 (two) times daily.   Comirnaty Susp injection Generic drug: COVID-19 mRNA Vac-TriS (Pfizer) Inject 0.3 mLs into the muscle once.   famotidine 40 MG tablet Commonly known as: PEPCID TAKE 1 TABLET BY MOUTH AT  BEDTIME   Fluad Quadrivalent 0.5 ML injection Generic drug: influenza vaccine adjuvanted Inject 0.5 mLs into the muscle once.   fluticasone 50 MCG/ACT nasal spray Commonly known as: FLONASE Place 1 spray into both nostrils daily.   gabapentin 100 MG capsule Commonly known as: Neurontin Take 1 capsule (100 mg total) by mouth 3 (three) times daily. One three times daily   hydrochlorothiazide 12.5 MG capsule Commonly known as: MICROZIDE Take 1 capsule (12.5 mg total) by mouth daily.   multivitamin capsule Take 1 capsule by mouth daily.   Omega 3 1000 MG Caps Take 1,000 mg by mouth daily.   RABEprazole 20 MG tablet Commonly known as: ACIPHEX TAKE 1 TABLET 30-60 MINUTES BEFORE 1ST AND LAST MEALS OF  THE DAY   Spacer/Aero-Holding Dorise Bullion 1 Device by Does not apply route as directed.   valACYclovir 500 MG tablet Commonly known as: VALTREX Take 500 mg by mouth 2 (two) times daily.   valsartan 40 MG tablet Commonly known as: DIOVAN Take 1 tablet by mouth daily.   Vitamin D2 10 MCG (400 UNIT) Tabs Take 1 tablet by mouth daily.    Past Medical History:  Diagnosis Date  . Abdominal distension   . Abdominal pain   . Arthritis   . Asthma   . Biliary colic   . COPD (chronic  obstructive pulmonary disease) ([place])   . Diarrhea   . GERD (gastroesophageal reflux disease)   . Hypertension   . Nausea     Past Surgical History:  Procedure Laterality Date  . cartilage removal  2007   left knee  . CESAREAN SECTION  1976  . LAPAROSCOPY  1992 (approx)    abdominal    Review of systems negative except as noted in HPI / PMHx or noted below:  Review of Systems  Constitutional: Negative.   HENT: Negative.    Eyes: Negative.   Respiratory: Negative.    Cardiovascular: Negative.   Gastrointestinal: Negative.   Genitourinary: Negative.   Musculoskeletal: Negative.   Skin: Negative.   Neurological: Negative.   Endo/Heme/Allergies: Negative.   Psychiatric/Behavioral: Negative.       Objective:   Vitals:   04/30/22 0907  BP: 122/68  Pulse: 73  Resp: 18  Temp: 98.1 F (36.7 C)  SpO2: 96%   Height: 4' 10.66" (149 cm)  Weight: 151 lb 3.2 oz (68.6 kg)   Physical Exam Constitutional:      Appearance: She is not diaphoretic.  HENT:     Head: Normocephalic.     Right Ear: Tympanic membrane, ear canal and external ear normal.     Left Ear: Tympanic membrane, ear canal and external ear normal.     Nose: Nose normal. No mucosal edema or rhinorrhea.     Mouth/Throat:     Pharynx: Uvula midline. No oropharyngeal exudate.  Eyes:     Conjunctiva/sclera: Conjunctivae normal.  Neck:     Thyroid: No thyromegaly.     Trachea: Trachea normal. No tracheal tenderness or tracheal deviation.  Cardiovascular:     Rate and Rhythm: Normal rate and regular rhythm.     Heart sounds: Normal heart sounds, S1 normal and S2 normal. No murmur heard. Pulmonary:     Effort: No respiratory distress.     Breath sounds: Normal breath sounds. No stridor. No wheezing or rales.  Lymphadenopathy:     Head:     Right side of head: No tonsillar adenopathy.     Left side of head: No tonsillar adenopathy.     Cervical: No cervical adenopathy.  Skin:    Findings: No erythema or  rash.     Nails: There is no clubbing.  Neurological:     Mental Status: She is alert.    Diagnostics:    Spirometry was performed and demonstrated an FEV1 of 1.18 at 70 % of predicted.  The patient had an Asthma Control Test with the following results:  .    Assessment and Plan:   1. Asthma, severe persistent, well-controlled   2. COPD with asthma   3. Perennial allergic rhinitis   4. LPRD (laryngopharyngeal reflux disease)    1.  Allergen avoidance measures - dust mite  2.  Treat and prevent inflammation of airway:  A.  BREZTRI -2 inhalations 1-2 times per day with spacer (empty lungs)  B.  Flonase -1 spray each nostril 3-7 times per week  3.  Treat and prevent reflux/LPR:   A.  Rabeprazole 20 mg -1 tablet in AM  B.  Famotidine 40 mg -1 tablet in p.m if needed  C.  Consolidate all forms of caffeine consumption  D.  Replace throat clearing with swallowing/drinking maneuver  4.  If needed:   A.  Albuterol HFA -2 inhalations or nebulization every 4-6 hours  B.  Cetirizine 10 mg -1 tablet 1 time per day  5.  Return to clinic in Summer 2024 or earlier if problem  Kitty appears to be doing quite well regarding her airway issue while consistently using anti-inflammatory agents for her airway and therapy directed against LPR as noted above.  There may be a opportunity to consolidate some of her medical treatment and I will have her attempt to use Breztri just 1 time per day but certainly she can go back up to twice a day should she develop problems and she can eliminate her famotidine but certainly can add in this H2 receptor blocker should it be required.  If she does well with the plan noted above I will see her back in this clinic in the summer 2024 or earlier if there is a problem.  Allena Katz, MD Allergy / Immunology [place]

## 2022-05-01 ENCOUNTER — Encounter: Payer: Self-pay | Admitting: Allergy and Immunology

## 2022-05-02 DIAGNOSIS — Z1231 Encounter for screening mammogram for malignant neoplasm of breast: Secondary | ICD-10-CM | POA: Diagnosis not present

## 2022-05-04 ENCOUNTER — Telehealth (INDEPENDENT_AMBULATORY_CARE_PROVIDER_SITE_OTHER): Payer: Medicare Other | Admitting: Student

## 2022-05-04 DIAGNOSIS — R051 Acute cough: Secondary | ICD-10-CM | POA: Diagnosis not present

## 2022-05-04 MED ORDER — PREDNISONE 10 MG PO TABS: ORAL_TABLET | ORAL | 0 refills | Status: DC

## 2022-05-04 NOTE — Telephone Encounter (Signed)
Virtual Visit via Telephone Note   I connected with Sue Mitchell today by a video enabled telemedicine application and verified that I am speaking with the correct person using two identifiers.   Location: Patient: Home Provider: Office   I discussed the limitations of evaluation and management by telemedicine and the availability of in person appointments. The patient expressed understanding and agreed to proceed.   Called and discussed acute cough. Increased sinus congestion, postnasal drainage, and chest congestion. No fever. Recommended restarting zyrtec, flonase daily, increasing to breztri 2 puff bid with spacer. If not sufficient over next couple of days, I've sent brief steroid taper per her request.    A total of 18 minutes was spent in chart review, discussion with pt and sending prescription to her pharmacy.

## 2022-05-06 ENCOUNTER — Telehealth: Payer: Self-pay | Admitting: Internal Medicine

## 2022-05-06 MED ORDER — AZITHROMYCIN 250 MG PO TABS: ORAL_TABLET | ORAL | 0 refills | Status: DC

## 2022-05-06 NOTE — Telephone Encounter (Signed)
Patient states having symptoms of cough and mucus. Pharmacy is [facility] Patient phone number is [phone].

## 2022-05-06 NOTE — Telephone Encounter (Signed)
Patient states took Covid 19 test and it was negative. Patient phone number is [phone].

## 2022-05-06 NOTE — Telephone Encounter (Signed)
Spoke with pt who states she already had Prednisone form Dr. Verlee Monte on Saturday. Z-Pak was sent in and pt stated she would covid test and let us know the results. Nothing further needed at this time.

## 2022-05-06 NOTE — Telephone Encounter (Addendum)
Called and spoke with patient. Patient stated that she feels terrible. Patient stated that she has a cough and is coughing up phlegm that is yellowish and brown. Patient stated that she's had the cough for 5 days. She also stated that the cough is constant and productive and she's losing sleep over the cough. Patient stated her stomach is sore and her chest is hurting.   MW, please advise.

## 2022-05-06 NOTE — Telephone Encounter (Signed)
ATC no answer, LVMM for pt to call the office back.

## 2022-05-06 NOTE — Telephone Encounter (Signed)
Check covid ag Prednisone 10 mg take  4 each am x 2 days,   2 each am x 2 days,  1 each am x 2 days and stop  Zmax

## 2022-05-07 NOTE — Telephone Encounter (Signed)
Routing to Dr. Melvyn Novas as Juluis Rainier. Nothing further needed

## 2022-05-10 DIAGNOSIS — Z780 Asymptomatic menopausal state: Secondary | ICD-10-CM | POA: Diagnosis not present

## 2022-06-09 ENCOUNTER — Other Ambulatory Visit: Payer: Self-pay | Admitting: Allergy and Immunology

## 2022-06-13 ENCOUNTER — Telehealth: Payer: Self-pay | Admitting: Internal Medicine

## 2022-06-13 NOTE — Telephone Encounter (Signed)
The PT called with this Fax # [phone]: Aris Everts

## 2022-06-13 NOTE — Telephone Encounter (Signed)
Sue Mitchell is faxing copy of chest xray over to [facility]. Nothing further needed

## 2022-06-13 NOTE — Telephone Encounter (Signed)
I have called the pt. I will send a copy of last recent cxr to  [facility] in [place].  I could not locate [facility] in [place]. I have reached back out to the pt about getting their number. Pt verbalized understanding,and will call office back with a update

## 2022-06-13 NOTE — Telephone Encounter (Signed)
Patient called to request that the results of her recent chest x-ray be sent to her rehab office.  She would like the nurse or doctor to call to see how this can be sent.  Patient wanted to know if it could be emailed as well.  Please call patient to discuss further at [phone]

## 2022-07-10 ENCOUNTER — Other Ambulatory Visit: Payer: Self-pay | Admitting: Allergy and Immunology

## 2022-08-20 ENCOUNTER — Ambulatory Visit: Payer: Medicare Other | Admitting: Allergy and Immunology

## 2022-08-20 VITALS — BP 136/84 | HR 68 | Temp 98.2°F | Resp 16 | Ht <= 58 in | Wt 153.0 lb

## 2022-08-20 DIAGNOSIS — K219 Gastro-esophageal reflux disease without esophagitis: Secondary | ICD-10-CM

## 2022-08-20 DIAGNOSIS — J3089 Other allergic rhinitis: Secondary | ICD-10-CM | POA: Diagnosis not present

## 2022-08-20 DIAGNOSIS — J4489 Other specified chronic obstructive pulmonary disease: Secondary | ICD-10-CM | POA: Diagnosis not present

## 2022-08-20 DIAGNOSIS — J4551 Severe persistent asthma with (acute) exacerbation: Secondary | ICD-10-CM

## 2022-08-20 MED ORDER — SPACER/AERO-HOLDING CHAMBERS DEVI: 1.0000 | 1 refills | Status: DC

## 2022-08-20 MED ORDER — FAMOTIDINE 40 MG PO TABS: 40.0000 mg | ORAL_TABLET | Freq: Two times a day (BID) | ORAL | 1 refills | Status: DC

## 2022-08-20 MED ORDER — ALBUTEROL SULFATE HFA 108 (90 BASE) MCG/ACT IN AERS: 2.0000 | INHALATION_SPRAY | RESPIRATORY_TRACT | 0 refills | Status: DC | PRN

## 2022-08-20 MED ORDER — METHYLPREDNISOLONE ACETATE 80 MG/ML IJ SUSP
80.0000 mg | Freq: Once | INTRAMUSCULAR | Status: AC
  Administered 2022-08-20: 80 mg via INTRAMUSCULAR

## 2022-08-20 MED ORDER — NEBULIZER MASK ADULT MISC: 1.0000 | 1 refills | Status: DC | PRN

## 2022-08-20 MED ORDER — CETIRIZINE HCL 10 MG PO TABS: 10.0000 mg | ORAL_TABLET | Freq: Every day | ORAL | 1 refills | Status: DC | PRN

## 2022-08-20 MED ORDER — BREZTRI AEROSPHERE 160-9-4.8 MCG/ACT IN AERO: 2.0000 | INHALATION_SPRAY | Freq: Two times a day (BID) | RESPIRATORY_TRACT | 1 refills | Status: DC

## 2022-08-20 MED ORDER — ALBUTEROL SULFATE (2.5 MG/3ML) 0.083% IN NEBU: 2.5000 mg | INHALATION_SOLUTION | Freq: Four times a day (QID) | RESPIRATORY_TRACT | 1 refills | Status: DC | PRN

## 2022-08-20 MED ORDER — FLUTICASONE PROPIONATE 50 MCG/ACT NA SUSP: 1.0000 | Freq: Every day | NASAL | 1 refills | Status: DC

## 2022-08-20 NOTE — Patient Instructions (Signed)
  1.  Allergen avoidance measures - dust mite  2.  Treat and prevent inflammation of airway:   A.  BREZTRI -2 inhalations 1-2 times per day with spacer (empty lungs)  B.  Flonase -1 spray each nostril 3-7 times per week  3.  Treat and prevent reflux/LPR:   A.  Famotidine 40 mg -1 tablet 2 times per day  C.  Consolidate all forms of caffeine consumption  D.  Replace throat clearing with swallowing/drinking maneuver  4.  If needed:   A.  Albuterol HFA -2 inhalations or nebulization every 4-6 hours  B.  Cetirizine 10 mg -1 tablet 1 time per day  5. For this recent event:   A. Depomedrol 80 mg IM delivered in clinic  B. Use the Breztri - 2 times per day  C. Use the Flonase every day  D. Further evaluation???  6.  Return to clinic in November 2024 or earlier if problem  7. Plan for fall flu vaccine

## 2022-08-20 NOTE — Progress Notes (Signed)
[place] - [place] - [place] - [place] - [place]   Follow-up Note  Referring Provider: Renaye Rakers, MD Primary Provider: Renaye Rakers, MD Date of Office Visit: 08/20/2022  Subjective:   Sue Mitchell (DOB: 08-30-1955) is a 67 y.o. female [facility] in re-evaluation of the following:  HPI: Sue Mitchell returns to this clinic in evaluation of asthma/COPD overlap, allergic rhinitis, LPR.  I last saw her in this clinic 30 April 2022.  She has noticed over the course of the past several months that she has had more problems with coughing and sneezing and she has been using her bronchodilator on pretty much a daily basis.  She is only using her Sue Mitchell once a day and she does not really use her nasal steroid to any significant amount.  There has not really been an obvious provoking factor that is giving rise to her increased respiratory activity.  She is not on any new medications that correlate with this issue as well.  Her reflux is going quite well.  She is only using famotidine twice a day at this point.  She has stopped her proton pump inhibitor.  She has noticed that since she stopped her proton pump inhibitor she has had much less bloating and gas.  Allergies as of 08/20/2022       Reactions   Codeine Hives, Itching, Other (See Comments)   All over the body, including burning sensation.   Ciprofloxacin Palpitations        Medication List    acyclovir 200 MG capsule Commonly known as: ZOVIRAX Take 200 mg by mouth 2 (two) times daily as needed (outbreaks).   albuterol (2.5 MG/3ML) 0.083% nebulizer solution Commonly known as: PROVENTIL Take 3 mLs (2.5 mg total) by nebulization every 6 (six) hours as needed for wheezing or shortness of breath.   albuterol 108 (90 Base) MCG/ACT inhaler Commonly known as: VENTOLIN HFA INHALE 2 INHALATIONS BY MOUTH  INTO THE LUNGS EVERY 4 TO 6  HOURS AS NEEDED FOR WHEEZING ,  SHORTNESS OF  BREATH, OR COUGH   amLODipine 5 MG tablet Commonly known as: NORVASC Take 1 tablet (5 mg total) by mouth daily.   ascorbic acid 1000 MG tablet Commonly known as: VITAMIN C Take 1,000 mg by mouth daily.   aspirin EC 81 MG tablet Take 81 mg by mouth daily.   B Complex Vitamins (w/ FA) Caps Take 1 capsule by mouth daily.   Breztri Aerosphere 160-9-4.8 MCG/ACT Aero Generic drug: Budeson-Glycopyrrol-Formoterol Inhale 2 puffs into the lungs in the morning and at bedtime. Inhale two puffs with spacer twice daily to prevent cough or wheeze.  Rinse, gargle, and spit after use.   cetirizine 10 MG tablet Commonly known as: ZYRTEC Take 1 tablet (10 mg total) by mouth daily as needed for allergies (Can take an extra dose during flare up.).   famotidine 40 MG tablet Commonly known as: PEPCID Take 1 tablet (40 mg total) by mouth at bedtime.   fluticasone 50 MCG/ACT nasal spray Commonly known as: FLONASE Place 1 spray into both nostrils daily.   gabapentin 100 MG capsule Commonly known as: Neurontin Take 1 capsule (100 mg total) by mouth 3 (three) times daily. One three times daily   hydrochlorothiazide 12.5 MG capsule Commonly known as: MICROZIDE Take 1 capsule (12.5 mg total) by mouth daily.   multivitamin capsule Take 1 capsule by mouth daily.   Omega 3 1000 MG Caps Take 1,000 mg  by mouth daily.   Spacer/Aero-Holding Sue Mitchell 1 Device by Does not apply route as directed.   valACYclovir 500 MG tablet Commonly known as: VALTREX Take 500 mg by mouth 2 (two) times daily.   valsartan 40 MG tablet Commonly known as: DIOVAN Take 1 tablet by mouth daily.   Vitamin D2 10 MCG (400 UNIT) Tabs Take 1 tablet by mouth daily.    Past Medical History:  Diagnosis Date   Abdominal distension    Abdominal pain    Arthritis    Asthma    Biliary colic    COPD (chronic obstructive pulmonary disease) (HCC)    Diarrhea    GERD (gastroesophageal reflux disease)    Hypertension     Nausea     Past Surgical History:  Procedure Laterality Date   cartilage removal  2007   left knee   CESAREAN SECTION  1976   LAPAROSCOPY  1992 (approx)    abdominal    Review of systems negative except as noted in HPI / PMHx or noted below:  Review of Systems  Constitutional: Negative.   HENT: Negative.    Eyes: Negative.   Respiratory: Negative.    Cardiovascular: Negative.   Gastrointestinal: Negative.   Genitourinary: Negative.   Musculoskeletal: Negative.   Skin: Negative.   Neurological: Negative.   Endo/Heme/Allergies: Negative.   Psychiatric/Behavioral: Negative.       Objective:   Vitals:   08/20/22 1037  BP: 136/84  Pulse: 68  Resp: 16  Temp: 98.2 F (36.8 C)  SpO2: 95%   Height: 4\' 10"  (147.3 cm)  Weight: 153 lb (69.4 kg)   Physical Exam Constitutional:      Appearance: She is not diaphoretic.  HENT:     Head: Normocephalic.     Right Ear: Tympanic membrane, ear canal and external ear normal.     Left Ear: Tympanic membrane, ear canal and external ear normal.     Nose: Nose normal. No mucosal edema or rhinorrhea.     Mouth/Throat:     Pharynx: Uvula midline. No oropharyngeal exudate.  Eyes:     Conjunctiva/sclera: Conjunctivae normal.  Neck:     Thyroid: No thyromegaly.     Trachea: Trachea normal. No tracheal tenderness or tracheal deviation.  Cardiovascular:     Rate and Rhythm: Normal rate and regular rhythm.     Heart sounds: Normal heart sounds, S1 normal and S2 normal. No murmur heard. Pulmonary:     Effort: No respiratory distress.     Breath sounds: Normal breath sounds. No stridor. No wheezing or rales.  Lymphadenopathy:     Head:     Right side of head: No tonsillar adenopathy.     Left side of head: No tonsillar adenopathy.     Cervical: No cervical adenopathy.  Skin:    Findings: No erythema or rash.     Nails: There is no clubbing.  Neurological:     Mental Status: She is alert.     Diagnostics: Spirometry was  performed and demonstrated an FEV1 of 0.95 at 57 % of predicted.  Her previous FEV1 was 1.18.  Assessment and Plan:   1. Asthma, not well controlled, severe persistent, with acute exacerbation   2. COPD with asthma   3. Perennial allergic rhinitis   4. LPRD (laryngopharyngeal reflux disease)    1.  Allergen avoidance measures - dust mite  2.  Treat and prevent inflammation of airway:   A.  BREZTRI -2 inhalations 1-2 times  per day with spacer (empty lungs)  B.  Flonase -1 spray each nostril 3-7 times per week  3.  Treat and prevent reflux/LPR:   A.  Famotidine 40 mg -1 tablet 2 times per day  C.  Consolidate all forms of caffeine consumption  D.  Replace throat clearing with swallowing/drinking maneuver  4.  If needed:   A.  Albuterol HFA -2 inhalations or nebulization every 4-6 hours  B.  Cetirizine 10 mg -1 tablet 1 time per day  5. For this recent event:   A. Depomedrol 80 mg IM delivered in clinic  B. Use the Breztri - 2 times per day  C. Use the Flonase every day  D. Further evaluation???  6.  Return to clinic in November 2024 or earlier if problem  7. Plan for fall flu vaccine  Fariha appears to have increased inflammation of her airway for some reason which is not entirely clear.  I am going to have her consistently use her Breztri and some Flonase and I given her systemic steroid today and I will make the assumption that everything will revert back to normal with this plan and if not she will contact me for further evaluation and treatment.  If she does well we will see her back in this clinic in November 2024.  Laurette Schimke, MD Allergy / Immunology [facility]

## 2022-08-21 ENCOUNTER — Encounter: Payer: Self-pay | Admitting: Allergy and Immunology

## 2022-09-06 DIAGNOSIS — M130 Polyarthritis, unspecified: Secondary | ICD-10-CM | POA: Diagnosis not present

## 2022-09-06 DIAGNOSIS — I10 Essential (primary) hypertension: Secondary | ICD-10-CM | POA: Diagnosis not present

## 2022-09-06 DIAGNOSIS — E1169 Type 2 diabetes mellitus with other specified complication: Secondary | ICD-10-CM | POA: Diagnosis not present

## 2022-09-06 DIAGNOSIS — J4531 Mild persistent asthma with (acute) exacerbation: Secondary | ICD-10-CM | POA: Diagnosis not present

## 2022-09-06 DIAGNOSIS — E559 Vitamin D deficiency, unspecified: Secondary | ICD-10-CM | POA: Diagnosis not present

## 2022-11-04 ENCOUNTER — Other Ambulatory Visit: Payer: Self-pay | Admitting: Allergy and Immunology

## 2022-11-05 ENCOUNTER — Telehealth: Payer: Self-pay | Admitting: Internal Medicine

## 2022-11-05 DIAGNOSIS — H35371 Puckering of macula, right eye: Secondary | ICD-10-CM | POA: Diagnosis not present

## 2022-11-05 DIAGNOSIS — H524 Presbyopia: Secondary | ICD-10-CM | POA: Diagnosis not present

## 2022-11-05 DIAGNOSIS — H2513 Age-related nuclear cataract, bilateral: Secondary | ICD-10-CM | POA: Diagnosis not present

## 2022-11-05 DIAGNOSIS — H5213 Myopia, bilateral: Secondary | ICD-10-CM | POA: Diagnosis not present

## 2022-11-05 DIAGNOSIS — H43811 Vitreous degeneration, right eye: Secondary | ICD-10-CM | POA: Diagnosis not present

## 2022-11-05 NOTE — Telephone Encounter (Signed)
Dr. [name] did you wanna send anything for the cough or wait till she is seen ? Please advise

## 2022-11-05 NOTE — Telephone Encounter (Signed)
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

## 2022-11-05 NOTE — Telephone Encounter (Signed)
Patient states having symptom of cough. Pharmacy is [facility] [address] Patient phone number is [phone].

## 2022-11-05 NOTE — Telephone Encounter (Signed)
Called and spoke with pt who has been having trouble with a cough for about a week. Pt has not been seen in office since 11/23. I have scheduled pt a OV with Tammy parrett on 10/25. Pt did not want an appointment with TP but that was next available spot. Dr.wert please advise

## 2022-11-06 NOTE — Telephone Encounter (Signed)
Called and spoke with pt. She decided to cancel her f/u visit and will f/u with her pcp in November. I did advise pt to go to urgent care if cough becomes worse. Nfn at this time

## 2022-12-12 ENCOUNTER — Other Ambulatory Visit (INDEPENDENT_AMBULATORY_CARE_PROVIDER_SITE_OTHER): Payer: Medicare Other

## 2022-12-12 ENCOUNTER — Ambulatory Visit: Payer: Medicare Other | Admitting: Orthopedic Surgery

## 2022-12-12 ENCOUNTER — Encounter: Payer: Self-pay | Admitting: Orthopedic Surgery

## 2022-12-12 DIAGNOSIS — M5442 Lumbago with sciatica, left side: Secondary | ICD-10-CM | POA: Diagnosis not present

## 2022-12-12 MED ORDER — PREDNISONE 10 MG PO TABS: 10.0000 mg | ORAL_TABLET | Freq: Every day | ORAL | 0 refills | Status: DC

## 2022-12-12 NOTE — Progress Notes (Signed)
Office Visit Note   Patient: Sue Mitchell           Date of Birth: 10/11/1955           MRN: [id number] Visit Date: 12/12/2022              Requested by: Renaye Rakers, MD [address] [place],  [place] [address] PCP: Renaye Rakers, MD  Chief Complaint  Patient presents with   Lower Back - Pain   Left Knee - Pain      HPI: Patient is a 67 year old woman who presents with left leg pain that radiates from the left buttock to the left heel.  She also complains of pain radiating to the medial lateral aspect of the left knee.  Patient denies any injury states this has been going on for several weeks.  Patient has had left knee arthroscopy several years ago.  Assessment & Plan: Visit Diagnoses:  1. Acute left-sided low back pain with left-sided sciatica     Plan: Will start a low-dose prednisone to see if this helps with the arthritis in the left knee and the left-sided radicular pain.  Patient may require an MRI scan of the lumbar spine.  Possible left knee injection.  Follow-Up Instructions: Return in about 4 weeks (around 01/09/2023).   Ortho Exam  Patient is alert, oriented, no adenopathy, well-dressed, normal affect, normal respiratory effort. Examination patient has tenderness to palpation with bony spurs of the medial aspect of the left knee.  She is wearing a knee sleeve.  She has a positive straight leg raise on the left with no focal motor weakness.  The straight leg reproduces buttocks pain that radiates to the left heel.  Imaging: XR Lumbar Spine 2-3 Views  Result Date: 12/12/2022 2 view radiographs of the lumbar spine shows degenerative disc disease with a grade 1 spondylolisthesis at L4-5.  No images are attached to the encounter.  Labs: Lab Results  Component Value Date   REPTSTATUS 06/11/2013 FINAL 06/10/2013   CULT NO GROWTH Performed at [facility] 06/10/2013     Lab Results  Component Value Date   ALBUMIN 3.5 04/12/2018   ALBUMIN 4.5  12/13/2010    No results found for: "MG" No results found for: "VD25OH"  No results found for: "PREALBUMIN"    Latest Ref Rng & Units 06/25/2021    9:53 AM 04/14/2018    9:19 PM 04/14/2018    6:26 AM  CBC EXTENDED  WBC 4.0 - 10.5 K/uL 7.9  11.5  12.0   RBC 3.87 - 5.11 Mil/uL 4.59  4.12  3.80   Hemoglobin 12.0 - 15.0 g/dL 19.1  47.8  29.5   HCT 36.0 - 46.0 % 41.8  39.0  36.3   Platelets 150.0 - 400.0 K/uL 222.0  242  209   NEUT# 1.4 - 7.7 K/uL 5.0  7.4    Lymph# 0.7 - 4.0 K/uL 2.1  2.8       There is no height or weight on file to calculate BMI.  Orders:  Orders Placed This Encounter  Procedures   XR Lumbar Spine 2-3 Views   No orders of the defined types were placed in this encounter.    Procedures: No procedures performed  Clinical Data: No additional findings.  ROS:  All other systems negative, except as noted in the HPI. Review of Systems  Objective: Vital Signs: There were no vitals taken for this visit.  Specialty Comments:  No specialty comments  available.  PMFS History: Patient Active Problem List   Diagnosis Date Noted   Former smoker 01/07/2022   Asthmatic bronchitis with exacerbation 11/21/2020   Atypical chest pain c/w IBS/ recurrent 09/27/2020   Healthcare maintenance 02/07/2020   Shoulder pain, left 01/14/2019   Pulmonary infiltrate 01/14/2019   Acute respiratory failure with hypoxia (HCC) 04/14/2018   Hemoptysis 04/11/2018   Influenza 04/11/2018   Cough 04/11/2018   Acute maxillary sinusitis 02/20/2018   Rhinitis, chronic 12/04/2017   Cough variant asthma  vs uacs with vcd 12/26/2016   Past Medical History:  Diagnosis Date   Abdominal distension    Abdominal pain    Arthritis    Asthma    Biliary colic    COPD (chronic obstructive pulmonary disease) (HCC)    Diarrhea    GERD (gastroesophageal reflux disease)    Hypertension    Nausea     Family History  Problem Relation Age of Onset   Diabetes Father     Past Surgical  History:  Procedure Laterality Date   cartilage removal  2007   left knee   CESAREAN SECTION  1976   LAPAROSCOPY  1992 (approx)    abdominal   Social History   Occupational History   Not on file  Tobacco Use   Smoking status: Former    Current packs/day: 0.00    Average packs/day: 0.5 packs/day for 25.0 years (12.5 ttl pk-yrs)    Types: Cigarettes    Start date: 42    Quit date: 2011    Years since quitting: 13.8   Smokeless tobacco: Never  Vaping Use   Vaping status: Never Used  Substance and Sexual Activity   Alcohol use: Yes    Comment: occasional 2 per month   Drug use: No   Sexual activity: Yes

## 2022-12-20 ENCOUNTER — Ambulatory Visit: Payer: Medicare Other | Admitting: Adult Health

## 2022-12-31 ENCOUNTER — Ambulatory Visit: Payer: Medicare Other | Admitting: Allergy and Immunology

## 2022-12-31 ENCOUNTER — Other Ambulatory Visit: Payer: Self-pay

## 2022-12-31 VITALS — BP 116/64 | HR 74 | Temp 98.0°F | Resp 16 | Ht 59.0 in | Wt 150.0 lb

## 2022-12-31 DIAGNOSIS — J4551 Severe persistent asthma with (acute) exacerbation: Secondary | ICD-10-CM

## 2022-12-31 DIAGNOSIS — J3089 Other allergic rhinitis: Secondary | ICD-10-CM

## 2022-12-31 DIAGNOSIS — J4489 Other specified chronic obstructive pulmonary disease: Secondary | ICD-10-CM

## 2022-12-31 DIAGNOSIS — K219 Gastro-esophageal reflux disease without esophagitis: Secondary | ICD-10-CM

## 2022-12-31 MED ORDER — ALBUTEROL SULFATE HFA 108 (90 BASE) MCG/ACT IN AERS: 2.0000 | INHALATION_SPRAY | RESPIRATORY_TRACT | 0 refills | Status: DC | PRN

## 2022-12-31 MED ORDER — ASMANEX HFA 200 MCG/ACT IN AERO: 2.0000 | INHALATION_SPRAY | RESPIRATORY_TRACT | 1 refills | Status: AC | PRN

## 2022-12-31 MED ORDER — SPACER/AERO-HOLDING CHAMBERS DEVI: 1.0000 | 1 refills | Status: DC

## 2022-12-31 MED ORDER — FLUTICASONE PROPIONATE 50 MCG/ACT NA SUSP: 1.0000 | Freq: Every day | NASAL | 1 refills | Status: DC

## 2022-12-31 MED ORDER — BREZTRI AEROSPHERE 160-9-4.8 MCG/ACT IN AERO: 2.0000 | INHALATION_SPRAY | Freq: Two times a day (BID) | RESPIRATORY_TRACT | 1 refills | Status: DC

## 2022-12-31 MED ORDER — ALBUTEROL SULFATE (2.5 MG/3ML) 0.083% IN NEBU: 2.5000 mg | INHALATION_SOLUTION | Freq: Four times a day (QID) | RESPIRATORY_TRACT | 1 refills | Status: DC | PRN

## 2022-12-31 MED ORDER — CETIRIZINE HCL 10 MG PO TABS: 10.0000 mg | ORAL_TABLET | Freq: Every day | ORAL | 1 refills | Status: DC | PRN

## 2022-12-31 MED ORDER — NEBULIZER MASK ADULT MISC: 1.0000 | 1 refills | Status: DC | PRN

## 2022-12-31 NOTE — Progress Notes (Unsigned)
[place] - [place] - [place] - [place] - [place]   Follow-up Note  Referring Provider: Renaye Rakers, MD Primary Provider: Renaye Rakers, MD Date of Office Visit: 12/31/2022  Subjective:   Sue Mitchell (DOB: 1955-10-30) is a 67 y.o. female who [facility]22 in re-evaluation of the following:  HPI: Sue Mitchell returns to this clinic in evaluation of asthma, COPD, allergic rhinitis, LPR.  I last saw her in this clinic 20 August 2022.  She was doing relatively well until about 2 weeks ago at which point in time she developed nasal congestion and sneezing and lots of coughing and throat clearing and she has been having wheezing.  Apparently she was given a prescription for prednisone which she did not use and she is slowly getting better regarding this issue.  Prior to this point she had done well and that she had not required a systemic steroid or an antibiotic for any type of airway issue while she consistently used her Markus Daft twice a day and her Flonase every day.  Her use of albuterol was a few times per week.  And she thinks that her reflux is under pretty good control while using famotidine twice a day and she has had very little problems with her throat on this plan.  She did receive this years flu vaccine.  Allergies as of 12/31/2022       Reactions   Codeine Hives, Itching, Other (See Comments)   All over the body, including burning sensation.   Ciprofloxacin Palpitations        Medication List    acyclovir 200 MG capsule Commonly known as: ZOVIRAX Take 200 mg by mouth 2 (two) times daily as needed (outbreaks).   albuterol (2.5 MG/3ML) 0.083% nebulizer solution Commonly known as: PROVENTIL Take 3 mLs (2.5 mg total) by nebulization every 6 (six) hours as needed for wheezing or shortness of breath.   albuterol 108 (90 Base) MCG/ACT inhaler Commonly known as: VENTOLIN HFA Inhale 2 puffs into the lungs every 4 (four)  hours as needed for wheezing or shortness of breath.   amLODipine 5 MG tablet Commonly known as: NORVASC Take 1 tablet (5 mg total) by mouth daily.   ascorbic acid 1000 MG tablet Commonly known as: VITAMIN C Take 1,000 mg by mouth daily.   aspirin EC 81 MG tablet Take 81 mg by mouth daily.   B Complex Vitamins (w/ FA) Caps Take 1 capsule by mouth daily.   Breztri Aerosphere 160-9-4.8 MCG/ACT Aero Generic drug: Budeson-Glycopyrrol-Formoterol Inhale 2 puffs into the lungs in the morning and at bedtime. Inhale two puffs with spacer twice daily to prevent cough or wheeze.  Rinse, gargle, and spit after use.   cetirizine 10 MG tablet Commonly known as: ZYRTEC Take 1 tablet (10 mg total) by mouth daily as needed for allergies (Can take an extra dose during flare up.).   famotidine 40 MG tablet Commonly known as: PEPCID TAKE 1 TABLET BY MOUTH TWICE  DAILY   fluticasone 50 MCG/ACT nasal spray Commonly known as: FLONASE Place 1 spray into both nostrils daily.   gabapentin 100 MG capsule Commonly known as: Neurontin Take 1 capsule (100 mg total) by mouth 3 (three) times daily. One three times daily   hydrochlorothiazide 12.5 MG capsule Commonly known as: MICROZIDE Take 1 capsule (12.5 mg total) by mouth daily.   multivitamin capsule Take 1 capsule by mouth daily.   Nebulizer Mask Adult Misc 1 kit by Does  not apply route as needed.   Omega 3 1000 MG Caps Take 1,000 mg by mouth daily.   predniSONE 10 MG tablet Commonly known as: DELTASONE Take 1 tablet (10 mg total) by mouth daily with breakfast.   Spacer/Aero-Holding Rudean Curt 1 Device by Does not apply route as directed.   valACYclovir 500 MG tablet Commonly known as: VALTREX Take 500 mg by mouth 2 (two) times daily.   valsartan 40 MG tablet Commonly known as: DIOVAN Take 1 tablet by mouth daily.   Vitamin D2 10 MCG (400 UNIT) Tabs Take 1 tablet by mouth daily.     Past Medical History:  Diagnosis Date    Abdominal distension    Abdominal pain    Arthritis    Asthma    Biliary colic    COPD (chronic obstructive pulmonary disease) (HCC)    Diarrhea    GERD (gastroesophageal reflux disease)    Hypertension    Nausea     Past Surgical History:  Procedure Laterality Date   cartilage removal  2007   left knee   CESAREAN SECTION  1976   LAPAROSCOPY  1992 (approx)    abdominal    Review of systems negative except as noted in HPI / PMHx or noted below:  Review of Systems  Constitutional: Negative.   HENT: Negative.    Eyes: Negative.   Respiratory: Negative.    Cardiovascular: Negative.   Gastrointestinal: Negative.   Genitourinary: Negative.   Musculoskeletal: Negative.   Skin: Negative.   Neurological: Negative.   Endo/Heme/Allergies: Negative.   Psychiatric/Behavioral: Negative.       Objective:   Vitals:   12/31/22 0938  BP: 116/64  Pulse: 74  Resp: 16  Temp: 98 F (36.7 C)  SpO2: 96%   Height: 4\' 11"  (149.9 cm)  Weight: 150 lb (68 kg)   Physical Exam Constitutional:      Appearance: She is not diaphoretic.  HENT:     Head: Normocephalic.     Right Ear: Tympanic membrane, ear canal and external ear normal.     Left Ear: Tympanic membrane, ear canal and external ear normal.     Nose: Nose normal. No mucosal edema or rhinorrhea.     Mouth/Throat:     Pharynx: Uvula midline. No oropharyngeal exudate.  Eyes:     Conjunctiva/sclera: Conjunctivae normal.  Neck:     Thyroid: No thyromegaly.     Trachea: Trachea normal. No tracheal tenderness or tracheal deviation.  Cardiovascular:     Rate and Rhythm: Normal rate and regular rhythm.     Heart sounds: Normal heart sounds, S1 normal and S2 normal. No murmur heard. Pulmonary:     Effort: No respiratory distress.     Breath sounds: No stridor. Wheezing (end expiratory wheezing) present. No rales.  Lymphadenopathy:     Head:     Right side of head: No tonsillar adenopathy.     Left side of head: No  tonsillar adenopathy.     Cervical: No cervical adenopathy.  Skin:    Findings: No erythema or rash.     Nails: There is no clubbing.  Neurological:     Mental Status: She is alert.     Diagnostics: Spirometry was performed and demonstrated an FEV1 of 1.12 at 67 % of predicted.  Assessment and Plan:   1. Asthma, not well controlled, severe persistent, with acute exacerbation   2. COPD with asthma (HCC)   3. Perennial allergic rhinitis   4. LPRD (  laryngopharyngeal reflux disease)    1.  Allergen avoidance measures - dust mite  2.  Treat and prevent inflammation of airway:   A.  BREZTRI -2 inhalations 1-2 times per day with spacer (empty lungs)  B.  Flonase -1 spray each nostril 3-7 times per week  3.  Treat and prevent reflux/LPR:   A.  Famotidine 40 mg -1 tablet 2 times per day  C.  Consolidate all forms of caffeine consumption  D.  Replace throat clearing with swallowing/drinking maneuver  4.  If needed:   A.  Albuterol + Asmanex 200 -2 inhalations TOGETHER every 4-6 hrs B.  Albuterol  nebulization + Asmanex 200 - 2 inhaltions TOGETHER every 4-6 hrs  B.  Cetirizine 10 mg -1 tablet 1 time per day  5. Submit for insurance approval for [employer] administration    6.  Return to clinic in 3 months or earlier if problem  7. Plan for fall flu vaccine  Sue Mitchell does not have good control of her atopic respiratory disease and we will apply for tezepelumab approval through her insurance company while she continues on a collection of anti-inflammatory agents for her airway.  I think her most recent flareup was probably precipitated by a viral respiratory tract infection.  She is doing well enough now that I do not think we need to give her any systemic steroids but we will now have her use more inhaled steroid whenever she uses a bronchodilator as noted above.  I will see her back in this clinic in 3 months or earlier if there is a problem.  Sue Schimke, MD Allergy /  Immunology [facility]

## 2022-12-31 NOTE — Patient Instructions (Addendum)
  1.  Allergen avoidance measures - dust mite  2.  Treat and prevent inflammation of airway:   A.  BREZTRI -2 inhalations 1-2 times per day with spacer (empty lungs)  B.  Flonase -1 spray each nostril 3-7 times per week  3.  Treat and prevent reflux/LPR:   A.  Famotidine 40 mg -1 tablet 2 times per day  C.  Consolidate all forms of caffeine consumption  D.  Replace throat clearing with swallowing/drinking maneuver  4.  If needed:   A.  Albuterol + Asmanex 200 -2 inhalations TOGETHER every 4-6 hrs B.  Albuterol  nebulization + Asmanex 200 - 2 inhaltions TOGETHER every 4-6 hrs  B.  Cetirizine 10 mg -1 tablet 1 time per day  5. Submit for insurance approval for [employer] administration    6.  Return to clinic in 3 months or earlier if problem  7. Plan for fall flu vaccine

## 2023-01-02 ENCOUNTER — Encounter: Payer: Self-pay | Admitting: Allergy and Immunology

## 2023-01-02 ENCOUNTER — Telehealth: Payer: Self-pay | Admitting: Allergy and Immunology

## 2023-01-02 NOTE — Telephone Encounter (Signed)
Called patient - DOB/Pharmacy verified - stated she spoke to [employer] who advised her of the $426 for the Asmanex - agent stated Qvar may be cheaper.  Patient advised message would be forwarded to provider for next step.

## 2023-01-02 NOTE — Telephone Encounter (Signed)
Patient called stating she called her pharmacy and the pharmacy told her in order to get the Asmanex inhaler it would cost her 400 dollars. The patient is wanting to know if there is something cheaper that insurance will cover. Patient states the pharmacy told her about Qvar might be cheaper for her.

## 2023-01-02 NOTE — Telephone Encounter (Signed)
Per Provider:   Joen Laura find out what inhaled steroid is allowed with her insurance plan.   Called patient back - DOB/NEED DPR verified - LMOVM in detail advising her to contact [employer] regarding her formulary tier - which inhaler alternative is cheapest for her to get then either contact the office or send message via myChart with information.  If patient call back - please advise of above notation.

## 2023-01-07 ENCOUNTER — Telehealth: Payer: Self-pay | Admitting: *Deleted

## 2023-01-07 NOTE — Telephone Encounter (Signed)
-----   Message from ERIC J KOZLOW sent at 01/02/2023  6:15 AM EST ----- Sue Mitchell

## 2023-01-07 NOTE — Telephone Encounter (Signed)
Called patient and advised will end app for PAP but I dis advise her it is difficult to get patient on PAP and with the end of year may take longer for decision. I did discuss prescription payment plan with her but sound like it would not be beneficial with the other meds she takes to be cost effective.

## 2023-01-09 ENCOUNTER — Ambulatory Visit: Payer: Medicare Other | Admitting: Orthopedic Surgery

## 2023-01-09 DIAGNOSIS — E7800 Pure hypercholesterolemia, unspecified: Secondary | ICD-10-CM | POA: Diagnosis not present

## 2023-01-09 DIAGNOSIS — R7309 Other abnormal glucose: Secondary | ICD-10-CM | POA: Diagnosis not present

## 2023-01-09 DIAGNOSIS — M130 Polyarthritis, unspecified: Secondary | ICD-10-CM | POA: Diagnosis not present

## 2023-01-09 DIAGNOSIS — I10 Essential (primary) hypertension: Secondary | ICD-10-CM | POA: Diagnosis not present

## 2023-01-13 ENCOUNTER — Ambulatory Visit: Payer: Medicare Other | Admitting: Orthopedic Surgery

## 2023-01-13 ENCOUNTER — Encounter: Payer: Self-pay | Admitting: Orthopedic Surgery

## 2023-01-13 DIAGNOSIS — M25562 Pain in left knee: Secondary | ICD-10-CM

## 2023-01-13 DIAGNOSIS — M545 Low back pain, unspecified: Secondary | ICD-10-CM

## 2023-01-13 DIAGNOSIS — G8929 Other chronic pain: Secondary | ICD-10-CM

## 2023-01-13 NOTE — Progress Notes (Signed)
Office Visit Note   Patient: Sue Mitchell           Date of Birth: 29-Jun-1955           MRN: [id number] Visit Date: 01/13/2023              Requested by: Renaye Rakers, MD [address] [place],  [place] [address] PCP: Renaye Rakers, MD  Chief Complaint  Patient presents with   Lower Back - Follow-up   Left Knee - Follow-up      HPI: Patient is a 67 year old woman who presents with chronic lower back pain and left knee pain.  Patient states she did not take the prednisone.  Patient states she has restarted taking her turmeric and states that she is feeling a little better.  Assessment & Plan: Visit Diagnoses:  1. Chronic left-sided low back pain without sciatica   2. Chronic pain of left knee     Plan: Will set up physical therapy for the lumbar spine and left knee pain with reevaluation in 2 months.  Follow-Up Instructions: Return in about 2 months (around 03/15/2023).   Ortho Exam  Patient is alert, oriented, no adenopathy, well-dressed, normal affect, normal respiratory effort. Examination patient has left-sided paraspinous back pain.  There are no radicular symptoms negative straight leg raise and no focal motor weakness.  The left knee has crepitation with range of motion collaterals and cruciates are stable there is tenderness to palpation over the medial joint line.  No mechanical symptoms.  Imaging: No results found. No images are attached to the encounter.  Labs: Lab Results  Component Value Date   REPTSTATUS 06/11/2013 FINAL 06/10/2013   CULT NO GROWTH Performed at [facility] 06/10/2013     Lab Results  Component Value Date   ALBUMIN 3.5 04/12/2018   ALBUMIN 4.5 12/13/2010    No results found for: "MG" No results found for: "VD25OH"  No results found for: "PREALBUMIN"    Latest Ref Rng & Units 06/25/2021    9:53 AM 04/14/2018    9:19 PM 04/14/2018    6:26 AM  CBC EXTENDED  WBC 4.0 - 10.5 K/uL 7.9  11.5  12.0   RBC 3.87 - 5.11 Mil/uL  4.59  4.12  3.80   Hemoglobin 12.0 - 15.0 g/dL 52.8  41.3  24.4   HCT 36.0 - 46.0 % 41.8  39.0  36.3   Platelets 150.0 - 400.0 K/uL 222.0  242  209   NEUT# 1.4 - 7.7 K/uL 5.0  7.4    Lymph# 0.7 - 4.0 K/uL 2.1  2.8       There is no height or weight on file to calculate BMI.  Orders:  No orders of the defined types were placed in this encounter.  No orders of the defined types were placed in this encounter.    Procedures: No procedures performed  Clinical Data: No additional findings.  ROS:  All other systems negative, except as noted in the HPI. Review of Systems  Objective: Vital Signs: There were no vitals taken for this visit.  Specialty Comments:  No specialty comments available.  PMFS History: Patient Active Problem List   Diagnosis Date Noted   Former smoker 01/07/2022   Asthmatic bronchitis with exacerbation 11/21/2020   Atypical chest pain c/w IBS/ recurrent 09/27/2020   Healthcare maintenance 02/07/2020   Shoulder pain, left 01/14/2019   Pulmonary infiltrate 01/14/2019   Acute respiratory failure with hypoxia (HCC) 04/14/2018   Hemoptysis  04/11/2018   Influenza 04/11/2018   Cough 04/11/2018   Acute maxillary sinusitis 02/20/2018   Rhinitis, chronic 12/04/2017   Cough variant asthma  vs uacs with vcd 12/26/2016   Past Medical History:  Diagnosis Date   Abdominal distension    Abdominal pain    Arthritis    Asthma    Biliary colic    COPD (chronic obstructive pulmonary disease) (HCC)    Diarrhea    GERD (gastroesophageal reflux disease)    Hypertension    Nausea     Family History  Problem Relation Age of Onset   Diabetes Father     Past Surgical History:  Procedure Laterality Date   cartilage removal  2007   left knee   CESAREAN SECTION  1976   LAPAROSCOPY  1992 (approx)    abdominal   Social History   Occupational History   Not on file  Tobacco Use   Smoking status: Former    Current packs/day: 0.00    Average packs/day: 0.5  packs/day for 25.0 years (12.5 ttl pk-yrs)    Types: Cigarettes    Start date: 2    Quit date: 2011    Years since quitting: 13.8   Smokeless tobacco: Never  Vaping Use   Vaping status: Never Used  Substance and Sexual Activity   Alcohol use: Yes    Comment: occasional 2 per month   Drug use: No   Sexual activity: Yes

## 2023-01-17 NOTE — Telephone Encounter (Signed)
Sue Mitchell called and states that [facility] told her that they do not cover Asmanex but the alternative would be Qvar at $47 for 30 day supply and Arnuity at $47 for a 30 day supply.  Tailer stated she didn't have a preference she just wanted whichever one Dr. Lucie Leather thinks she should try.  She would like the prescription sent to [facility].

## 2023-01-21 MED ORDER — QVAR REDIHALER 80 MCG/ACT IN AERB: 2.0000 | INHALATION_SPRAY | Freq: Two times a day (BID) | RESPIRATORY_TRACT | 2 refills | Status: DC

## 2023-01-21 NOTE — Addendum Note (Signed)
Addended by: Areta Haber B on: 01/21/2023 12:01 PM   Modules accepted: Orders

## 2023-01-21 NOTE — Telephone Encounter (Addendum)
Per Provider:  Please use Qvar 80 - same instruction as per Asmanex. May need to order at 2 inhalations 2 times per day or insurance will not fill.    Sent in Qvar 80 mcg/act prescription to [facility] per patient request.  Called patient - DOB verified - advised of above provider notation.  Patient verbalized understanding to all, no further questions.

## 2023-02-03 ENCOUNTER — Ambulatory Visit: Payer: Medicare Other | Admitting: Orthopedic Surgery

## 2023-02-03 ENCOUNTER — Other Ambulatory Visit: Payer: Self-pay

## 2023-02-03 ENCOUNTER — Encounter: Payer: Self-pay | Admitting: Physical Therapy

## 2023-02-03 ENCOUNTER — Ambulatory Visit: Payer: Medicare Other | Admitting: Physical Therapy

## 2023-02-03 DIAGNOSIS — M5442 Lumbago with sciatica, left side: Secondary | ICD-10-CM

## 2023-02-03 DIAGNOSIS — M79605 Pain in left leg: Secondary | ICD-10-CM | POA: Diagnosis not present

## 2023-02-03 DIAGNOSIS — M5459 Other low back pain: Secondary | ICD-10-CM

## 2023-02-03 DIAGNOSIS — R29898 Other symptoms and signs involving the musculoskeletal system: Secondary | ICD-10-CM

## 2023-02-03 DIAGNOSIS — M25562 Pain in left knee: Secondary | ICD-10-CM

## 2023-02-03 DIAGNOSIS — M6281 Muscle weakness (generalized): Secondary | ICD-10-CM

## 2023-02-03 DIAGNOSIS — G8929 Other chronic pain: Secondary | ICD-10-CM | POA: Diagnosis not present

## 2023-02-03 DIAGNOSIS — M545 Low back pain, unspecified: Secondary | ICD-10-CM

## 2023-02-03 NOTE — Therapy (Signed)
OUTPATIENT PHYSICAL THERAPY THORACOLUMBAR EVALUATION   Patient Name: Sue Mitchell MRN: [id number] DOB:04-26-1955, 67 y.o., female Today's Date: 02/03/2023  END OF SESSION:  PT End of Session - 02/03/23 1437     Visit Number 1    Number of Visits 13    Date for PT Re-Evaluation 03/17/23    Authorization Type [employer] MCR    Authorization Time Period 02/03/23 to 03/17/23    Progress Note Due on Visit 10    PT Start Time 1345    PT Stop Time 1429    PT Time Calculation (min) 44 min    Activity Tolerance Patient tolerated treatment well    Behavior During Therapy [facility] for tasks assessed/performed             Past Medical History:  Diagnosis Date   Abdominal distension    Abdominal pain    Arthritis    Asthma    Biliary colic    COPD (chronic obstructive pulmonary disease) (HCC)    Diarrhea    GERD (gastroesophageal reflux disease)    Hypertension    Nausea    Past Surgical History:  Procedure Laterality Date   cartilage removal  2007   left knee   CESAREAN SECTION  1976   LAPAROSCOPY  1992 (approx)    abdominal   Patient Active Problem List   Diagnosis Date Noted   Former smoker 01/07/2022   Asthmatic bronchitis with exacerbation 11/21/2020   Atypical chest pain c/w IBS/ recurrent 09/27/2020   Healthcare maintenance 02/07/2020   Shoulder pain, left 01/14/2019   Pulmonary infiltrate 01/14/2019   Acute respiratory failure with hypoxia (HCC) 04/14/2018   Hemoptysis 04/11/2018   Influenza 04/11/2018   Cough 04/11/2018   Acute maxillary sinusitis 02/20/2018   Rhinitis, chronic 12/04/2017   Cough variant asthma  vs uacs with vcd 12/26/2016    PCP: Renaye Rakers MD   REFERRING PROVIDER: Nadara Mustard, MD  REFERRING DIAG: Diagnosis M54.50,G89.29 (ICD-10-CM) - Chronic left-sided low back pain without sciatica M25.562,G89.29 (ICD-10-CM) - Chronic pain of left knee  Rationale for Evaluation and Treatment: Rehabilitation  THERAPY DIAG:  Other low back  pain  Pain in left leg  Muscle weakness (generalized)  Other symptoms and signs involving the musculoskeletal system  ONSET DATE: chronic "I've been having problems with the left side for awhile"   SUBJECTIVE:                                                                                                                                                                                           SUBJECTIVE STATEMENT:  I've been having problems with my left  side for awhile, I thought maybe it was some arthritis for awhile but in the past 4-6 months the knee has been hurting more and I've had some shooting pains in the knee. Dr. Lajoyce Corners said that my pain is coming from my back. Got an injection between C7 and T1 due to some shoulder issues, which helped a lot. Travels from higher up in my back and butt area down the back of my leg down to the knee.   PERTINENT HISTORY:  See above   PAIN:  Are you having pain? No 0/10 now in seated rest, can get to 8-9/10 at worst standing in one spot, moving is not as bad   PRECAUTIONS: None  RED FLAGS: None   WEIGHT BEARING RESTRICTIONS: No  FALLS:  Has patient fallen in last 6 months? 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 Lajoyce Corners today after PT   OBJECTIVE:  Note: Objective measures were completed at Evaluation unless otherwise noted.  DIAGNOSTIC FINDINGS:   2 view radiographs of the lumbar spine shows degenerative disc disease  with a grade 1 spondylolisthesis at L4-5.  PATIENT SURVEYS:  FOTO 48,  predicted 52 in 13 visits   SCREENING FOR RED FLAGS: Bowel or bladder incontinence: No Spinal tumors: No Cauda equina syndrome: No Compression fracture: No Abdominal aneurysm: No  COGNITION: Overall cognitive status: Within functional limits for tasks assessed     SENSATION: Not tested  MUSCLE LENGTH:  Hip flexors moderately tight B Quads moderate limitation B  Hamstrings moderate limitation B Piriformis mild limitation B   POSTURE: rounded shoulders, forward head, and increased thoracic kyphosis  PALPATION: Lumbar paraspinals tender L4-S1 level, proximal HS attachment   LUMBAR ROM:   AROM eval  Flexion WNL; RFIS no increase in pain    Extension Somewhat hypermobile   Right lateral flexion Mild limitation    Left lateral flexion Mild limitation   Right rotation Mild limitaiton   Left rotation Mild limitation    (Blank rows = not tested)    LOWER EXTREMITY MMT:    MMT Right eval Left eval  Hip flexion 4 4  Hip extension 3+ 3+  Hip abduction 3 3  Hip adduction    Hip internal rotation    Hip external rotation    Knee flexion 4 4  Knee extension 4+ 4+  Ankle dorsiflexion 4 4  Ankle plantarflexion    Ankle inversion    Ankle eversion     (Blank rows = not tested)    FUNCTIONAL TESTS:  Timed up and go (TUG): 7.8 seconds no device     TODAY'S TREATMENT:  DATE:   02/03/23- eval, POC, HEP  Scifit bike L4 x6 minutes BLEs only  Bridges x10  Figure 4 stretch supine 2x30 seconds B Hip flexor stretch 2x30 seconds B  TA set + marching x6   PATIENT EDUCATION:  Education details: exam findings, POC, HEP  Person educated: Patient Education method: Programmer, multimedia, Demonstration, and Handouts Education comprehension: verbalized understanding, returned demonstration, and needs further education  HOME EXERCISE PROGRAM: Access Code:  [email or web address] URL: [email or web address] Date: 02/03/2023 Prepared by: Nedra Hai  Exercises - Supine Bridge  - 1 x daily - 7 x weekly - 3 sets - 10 reps - Supine Figure 4 Piriformis Stretch  - 1 x daily - 7 x weekly - 3 sets - 10 reps - Modified Thomas Stretch  - 1 x daily - 7 x weekly - 3 sets - 10 reps - Seated Transversus Abdominus Bracing + March  - 1 x daily - 7 x weekly - 3 sets - 10 reps  ASSESSMENT:  CLINICAL IMPRESSION: Patient is a 68 y.o. F who was seen today for physical therapy evaluation and treatment for Diagnosis M54.50,G89.29 (ICD-10-CM) - Chronic left-sided low back pain without sciatica M25.562,G89.29 (ICD-10-CM) - Chronic pain of left knee. She does demonstrate some proximal mm weakness and stiffness along with poor core strength and postural impairments that may be contributing to symptoms. Did not seem to have a flexion or extension preference at eval but was a bit unclear describing symptoms today as well. Will benefit from trial of skilled PT services to attempt to address her concerns and assist in return to optimal level of function.    OBJECTIVE IMPAIRMENTS: Abnormal gait, decreased activity tolerance, decreased ROM, decreased strength, increased fascial restrictions, impaired flexibility, improper body mechanics, postural dysfunction, and pain.   ACTIVITY LIMITATIONS: carrying, lifting, bending, standing, stairs, transfers, bed mobility, locomotion level, and caring for others  PARTICIPATION LIMITATIONS: meal prep, cleaning, laundry, driving, shopping, community activity, and occupation  PERSONAL FACTORS: Age, Behavior pattern, Education, Fitness, Past/current experiences, Profession, Sex, Social background, and Time since onset of injury/illness/exacerbation are also affecting patient's functional outcome.   REHAB POTENTIAL: Fair chronicity of impairments   CLINICAL DECISION MAKING: Stable/uncomplicated  EVALUATION COMPLEXITY:  Low   GOALS: Goals reviewed with patient? No  SHORT TERM GOALS: Target date: 02/24/2023    Will be compliant with appropriate progressive HEP  Baseline: Goal status: INITIAL  2.  Will demonstrate good biomechanics for bed mobility and floor to waist lifting  Baseline:  Goal status: INITIAL  3.  Flexibility impairments to have improved by at least 50% Baseline:  Goal status: INITIAL    LONG TERM GOALS: Target date: 03/17/2023    MMT to improve by at least one grade in all weak groups  Baseline:  Goal status: INITIAL  2.  Pain to be no more than 4/10 at worst  Baseline:  Goal status: INITIAL  3.  Will have been able to return to outdoor exercise and fishing trips with no increase in pain from resting levels and no subjective unsteadiness over uneven surfaces to improve QOL  Baseline:  Goal status: INITIAL  4.  Will be able to stand statically for at least 30 minutes without increase in pain from resting levels to allow her to do functional tasks at home such as cooking  Baseline:  Goal status: INITIAL  5.  FOTO score to have met or exceeded predicted value by time of DC  Baseline:  Goal status: INITIAL    PLAN:  PT  FREQUENCY: 2x/week  PT DURATION: 6 weeks  PLANNED INTERVENTIONS: 97164- PT Re-evaluation, 97110-Therapeutic exercises, 97530- Therapeutic activity, [id number]- Neuromuscular re-education, 97535- Self Care, 16109- Manual therapy, [id number]- Gait training, [id number]- Aquatic Therapy, 97014- Electrical stimulation (unattended), Taping, Dry Needling, Cryotherapy, and Moist heat.  PLAN FOR NEXT SESSION: functional strengthening and flexibility, core work and biomechanics. Balance work PRN   Nedra Hai, PT, DPT 02/03/23 2:39 PM    Date of referral: 01/13/23 Referring provider: Nadara Mustard, MD Referring diagnosis? Diagnosis M54.50,G89.29 (ICD-10-CM) - Chronic left-sided low back pain without sciatica M25.562,G89.29 (ICD-10-CM) - Chronic pain of left  knee Treatment diagnosis? (if different than referring diagnosis) M54.59, M79.605, M62.81, R29.898  What was this (referring dx) caused by? Ongoing Issue  Ashby Dawes of Condition: Initial Onset (within last 3 months)   Laterality: Lt  Current Functional Measure Score: FOTO 48  Objective measurements identify impairments when they are compared to normal values, the uninvolved extremity, and prior level of function.  [x]  Yes  []  No  Objective assessment of functional ability: Moderate functional limitations   Briefly describe symptoms: pain in L knee coming from the back, tends to really hurt and become painful with static standing, OK with movement   How did symptoms start: insidiously   Average pain intensity:  Last 24 hours: 7/10  Past week: 8/10  How often does the pt experience symptoms? Frequently  How much have the symptoms interfered with usual daily activities? Quite a bit  How has condition changed since care began at this facility? A little worse  In general, how is the patients overall health? Good   BACK PAIN (STarT Back Screening Tool) Has pain spread down the leg(s) at some time in the last 2 weeks? Yes  Has there been pain in the shoulder or neck at some time in the last 2 weeks? Yes  Has the pt only walked short distances because of back pain? Yes  Does patient have worrying thoughts a lot of the time? Yes  Has patient stopped enjoying things they usually enjoy? Yes

## 2023-02-04 ENCOUNTER — Encounter: Payer: Self-pay | Admitting: Orthopedic Surgery

## 2023-02-04 NOTE — Progress Notes (Signed)
Office Visit Note   Patient: Sue Mitchell           Date of Birth: 1955/05/27           MRN: [id number] Visit Date: 02/03/2023              Requested by: Renaye Rakers, MD [address], [address] [place],  [place] [address] PCP: Renaye Rakers, MD  Chief Complaint  Patient presents with   Lower Back - Follow-up   Left Knee - Follow-up      HPI: Patient is a 67 year old woman who presents for left knee pain and lower back pain.  Patient states she did have her first physical therapy evaluation today upstairs.  Patient also complains of some increased stiffness over the thanksgiving in her left knee.  She states this is better now.  She states she does still have radicular pain from the lumbar spine down the left leg.  Assessment & Plan: Visit Diagnoses:  1. Chronic left-sided low back pain without sciatica   2. Chronic pain of left knee   3. Acute left-sided low back pain with left-sided sciatica     Plan: Recommended continue with therapy and core strengthening as well as knee strengthening.  Follow-Up Instructions: Return in about 4 weeks (around 03/03/2023).   Ortho Exam  Patient is alert, oriented, no adenopathy, well-dressed, normal affect, normal respiratory effort. Examination patient has a negative straight leg raise.  There is no focal motor weakness in the left lower extremity.  The left knee is stable without effusion.  Imaging: No results found. No images are attached to the encounter.  Labs: Lab Results  Component Value Date   REPTSTATUS 06/11/2013 FINAL 06/10/2013   CULT NO GROWTH Performed at [facility] 06/10/2013     Lab Results  Component Value Date   ALBUMIN 3.5 04/12/2018   ALBUMIN 4.5 12/13/2010    No results found for: "MG" No results found for: "VD25OH"  No results found for: "PREALBUMIN"    Latest Ref Rng & Units 06/25/2021    9:53 AM 04/14/2018    9:19 PM 04/14/2018    6:26 AM  CBC EXTENDED  WBC 4.0 - 10.5 K/uL 7.9  11.5  12.0    RBC 3.87 - 5.11 Mil/uL 4.59  4.12  3.80   Hemoglobin 12.0 - 15.0 g/dL 19.1  47.8  29.5   HCT 36.0 - 46.0 % 41.8  39.0  36.3   Platelets 150.0 - 400.0 K/uL 222.0  242  209   NEUT# 1.4 - 7.7 K/uL 5.0  7.4    Lymph# 0.7 - 4.0 K/uL 2.1  2.8       There is no height or weight on file to calculate BMI.  Orders:  No orders of the defined types were placed in this encounter.  No orders of the defined types were placed in this encounter.    Procedures: No procedures performed  Clinical Data: No additional findings.  ROS:  All other systems negative, except as noted in the HPI. Review of Systems  Objective: Vital Signs: There were no vitals taken for this visit.  Specialty Comments:  No specialty comments available.  PMFS History: Patient Active Problem List   Diagnosis Date Noted   Former smoker 01/07/2022   Asthmatic bronchitis with exacerbation 11/21/2020   Atypical chest pain c/w IBS/ recurrent 09/27/2020   Healthcare maintenance 02/07/2020   Shoulder pain, left 01/14/2019   Pulmonary infiltrate 01/14/2019   Acute respiratory failure with  hypoxia (HCC) 04/14/2018   Hemoptysis 04/11/2018   Influenza 04/11/2018   Cough 04/11/2018   Acute maxillary sinusitis 02/20/2018   Rhinitis, chronic 12/04/2017   Cough variant asthma  vs uacs with vcd 12/26/2016   Past Medical History:  Diagnosis Date   Abdominal distension    Abdominal pain    Arthritis    Asthma    Biliary colic    COPD (chronic obstructive pulmonary disease) (HCC)    Diarrhea    GERD (gastroesophageal reflux disease)    Hypertension    Nausea     Family History  Problem Relation Age of Onset   Diabetes Father     Past Surgical History:  Procedure Laterality Date   cartilage removal  2007   left knee   CESAREAN SECTION  1976   LAPAROSCOPY  1992 (approx)    abdominal   Social History   Occupational History   Not on file  Tobacco Use   Smoking status: Former    Current packs/day: 0.00     Average packs/day: 0.5 packs/day for 25.0 years (12.5 ttl pk-yrs)    Types: Cigarettes    Start date: 66    Quit date: 2011    Years since quitting: 13.9   Smokeless tobacco: Never  Vaping Use   Vaping status: Never Used  Substance and Sexual Activity   Alcohol use: Yes    Comment: occasional 2 per month   Drug use: No   Sexual activity: Yes

## 2023-02-06 ENCOUNTER — Encounter: Payer: Medicare Other | Admitting: Rehabilitative and Restorative Service Providers"

## 2023-02-14 ENCOUNTER — Encounter: Payer: Self-pay | Admitting: Physical Therapy

## 2023-02-14 ENCOUNTER — Ambulatory Visit (INDEPENDENT_AMBULATORY_CARE_PROVIDER_SITE_OTHER): Payer: Medicare Other | Admitting: Physical Therapy

## 2023-02-14 DIAGNOSIS — M79605 Pain in left leg: Secondary | ICD-10-CM | POA: Diagnosis not present

## 2023-02-14 DIAGNOSIS — R29898 Other symptoms and signs involving the musculoskeletal system: Secondary | ICD-10-CM | POA: Diagnosis not present

## 2023-02-14 DIAGNOSIS — M6281 Muscle weakness (generalized): Secondary | ICD-10-CM | POA: Diagnosis not present

## 2023-02-14 DIAGNOSIS — M5459 Other low back pain: Secondary | ICD-10-CM | POA: Diagnosis not present

## 2023-02-14 NOTE — Therapy (Signed)
OUTPATIENT PHYSICAL THERAPY THORACOLUMBAR TREATMENT   Patient Name: Sue Mitchell MRN: [id number] DOB:[birth year], 67 y.o., female Today's Date: 02/14/2023  END OF SESSION:  PT End of Session - 02/14/23 1353     Visit Number 2    Number of Visits 13    Date for PT Re-Evaluation 03/17/23    Authorization Type [employer] MCR    Authorization Time Period 02/03/23 to 03/17/23    Progress Note Due on Visit 10    PT Start Time 1347    PT Stop Time 1428    PT Time Calculation (min) 41 min    Activity Tolerance Patient tolerated treatment well    Behavior During Therapy [facility] for tasks assessed/performed              Past Medical History:  Diagnosis Date   Abdominal distension    Abdominal pain    Arthritis    Asthma    Biliary colic    COPD (chronic obstructive pulmonary disease) (HCC)    Diarrhea    GERD (gastroesophageal reflux disease)    Hypertension    Nausea    Past Surgical History:  Procedure Laterality Date   cartilage removal  2007   left knee   CESAREAN SECTION  1976   LAPAROSCOPY  1992 (approx)    abdominal   Patient Active Problem List   Diagnosis Date Noted   Former smoker 01/07/2022   Asthmatic bronchitis with exacerbation 11/21/2020   Atypical chest pain c/w IBS/ recurrent 09/27/2020   Healthcare maintenance 02/07/2020   Shoulder pain, left 01/14/2019   Pulmonary infiltrate 01/14/2019   Acute respiratory failure with hypoxia (HCC) 04/14/2018   Hemoptysis 04/11/2018   Influenza 04/11/2018   Cough 04/11/2018   Acute maxillary sinusitis 02/20/2018   Rhinitis, chronic 12/04/2017   Cough variant asthma  vs uacs with vcd 12/26/2016    PCP: Renaye Rakers MD   REFERRING PROVIDER: Nadara Mustard, MD  REFERRING DIAG: Diagnosis M54.50,G89.29 (ICD-10-CM) - Chronic left-sided low back pain without sciatica M25.562,G89.29 (ICD-10-CM) - Chronic pain of left knee  Rationale for Evaluation and Treatment: Rehabilitation  THERAPY DIAG:  Other low back  pain  Pain in left leg  Other symptoms and signs involving the musculoskeletal system  Muscle weakness (generalized)  ONSET DATE: chronic "I've been having problems with the left side for awhile"   SUBJECTIVE:                                                                                                                                                                                           SUBJECTIVE STATEMENT:  Feeling a little jittery from my  inhalers, this is normal for me. I'm still sore from being up on ladders cleaning my blinds and everything it was a hard job.      EVAL: I've been having problems with my left side for awhile, I thought maybe it was some arthritis for awhile but in the past 4-6 months the knee has been hurting more and I've had some shooting pains in the knee. Dr. Lajoyce Corners said that my pain is coming from my back. Got an injection between C7 and T1 due to some shoulder issues, which helped a lot. Travels from higher up in my back and butt area down the back of my leg down to the knee.   PERTINENT HISTORY:  See above   PAIN:  Are you having pain? Yes: NPRS scale: 6/10 Pain location: R low back going down back of L LE to knee  Pain description: pulling  Aggravating factors: sometimes walking Relieving factors: unsure     PRECAUTIONS: None  RED FLAGS: None   WEIGHT BEARING RESTRICTIONS: No  FALLS:  Has patient fallen in last 6 months? 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 Lajoyce Corners today after PT   OBJECTIVE:   Note: Objective measures were completed at Evaluation unless otherwise noted.  DIAGNOSTIC FINDINGS:   2 view radiographs of the lumbar spine shows degenerative disc disease  with a grade 1 spondylolisthesis at L4-5.  PATIENT SURVEYS:  FOTO 48, predicted 52 in 13 visits   SCREENING FOR RED FLAGS: Bowel or bladder incontinence: No Spinal tumors: No Cauda equina syndrome: No Compression fracture: No Abdominal aneurysm: No  COGNITION: Overall cognitive status: Within functional limits for tasks assessed     SENSATION: Not tested  MUSCLE LENGTH:  Hip flexors moderately tight B Quads moderate limitation B  Hamstrings moderate limitation B Piriformis mild limitation B   POSTURE: rounded shoulders, forward head, and increased thoracic kyphosis  PALPATION: Lumbar paraspinals tender L4-S1 level, proximal HS attachment   LUMBAR ROM:   AROM eval  Flexion WNL; RFIS no increase in pain    Extension Somewhat hypermobile   Right lateral flexion Mild limitation    Left lateral flexion Mild limitation   Right rotation Mild limitaiton   Left rotation Mild limitation    (Blank rows = not tested)    LOWER EXTREMITY MMT:    MMT Right eval Left eval  Hip flexion 4 4  Hip extension 3+ 3+  Hip abduction 3 3  Hip adduction    Hip internal rotation    Hip external rotation    Knee flexion 4 4  Knee extension 4+ 4+  Ankle dorsiflexion 4 4  Ankle plantarflexion    Ankle inversion    Ankle eversion     (Blank rows = not tested)    FUNCTIONAL TESTS:  Timed up and go (TUG): 7.8 seconds no device     TODAY'S TREATMENT:  DATE:   02/14/23  TherEx  Nustep L4 x8 minutes BLEs only  QL stretches 3 way x30 seconds each HS stretches 2x30 seconds B  Seated piriformis stretch 2x30 seconds B  Bridges x15 Sidelying clams red TB x12  PPT 12x3 second  holds Supine TA sets + B march x15  Sciatic flossing x20 supine with gait belt     02/03/23- eval, POC, HEP  Scifit bike L4 x6 minutes BLEs only  Bridges x10  Figure 4 stretch supine 2x30 seconds B Hip flexor stretch 2x30 seconds B  TA set + marching x6   PATIENT EDUCATION:  Education details: exam findings, POC, HEP  Person educated: Patient Education method: Programmer, multimedia, Demonstration, and Handouts Education comprehension: verbalized understanding, returned demonstration, and needs further education  HOME EXERCISE PROGRAM: Access Code: [email or web address] URL: [email or web address] Date: 02/03/2023 Prepared by: Nedra Hai  Exercises - Supine Bridge  - 1 x daily - 7 x weekly - 3 sets - 10 reps - Supine Figure 4 Piriformis Stretch  - 1 x daily - 7 x weekly - 3 sets - 10 reps - Modified Thomas Stretch  - 1 x daily - 7 x weekly - 3 sets - 10 reps - Seated Transversus Abdominus Bracing + March  - 1 x daily - 7 x weekly - 3 sets - 10 reps  ASSESSMENT:  CLINICAL IMPRESSION:  Pt arrives today doing OK, still having issues with pain in her R LE going down to her knee. We focused on functional exercises and mobility work today, also some core strengthening as tolerated. Did ok, able to reduce pain down to 4/10 at EOS with radicular only going half way down her thigh instead of all the way to the knee.     EVAL: Patient is a 67 y.o. F who was seen today for physical therapy evaluation and treatment for Diagnosis M54.50,G89.29 (ICD-10-CM) - Chronic left-sided low back pain without sciatica M25.562,G89.29 (ICD-10-CM) - Chronic pain of left knee. She does demonstrate some proximal mm weakness and stiffness along with poor core strength and postural impairments that may be contributing to symptoms. Did not seem to have a flexion or extension preference at eval but was a bit unclear describing symptoms today as well. Will benefit from trial of skilled PT services to attempt to  address her concerns and assist in return to optimal level of function.    OBJECTIVE IMPAIRMENTS: Abnormal gait, decreased activity tolerance, decreased ROM, decreased strength, increased fascial restrictions, impaired flexibility, improper body mechanics, postural dysfunction, and pain.   ACTIVITY LIMITATIONS: carrying, lifting, bending, standing, stairs, transfers, bed mobility, locomotion level, and caring for others  PARTICIPATION LIMITATIONS: meal prep, cleaning, laundry, driving, shopping, community activity, and occupation  PERSONAL FACTORS: Age, Behavior pattern, Education, Fitness, Past/current experiences, Profession, Sex, Social background, and Time since onset of injury/illness/exacerbation are also affecting patient's functional outcome.   REHAB POTENTIAL: Fair chronicity of impairments   CLINICAL DECISION MAKING: Stable/uncomplicated  EVALUATION COMPLEXITY: Low   GOALS: Goals reviewed with patient? No  SHORT TERM GOALS: Target date: 02/24/2023    Will be compliant with appropriate progressive HEP  Baseline: Goal status: INITIAL  2.  Will demonstrate good biomechanics for bed mobility and floor to waist lifting  Baseline:  Goal status: INITIAL  3.  Flexibility impairments to have improved by at least 50% Baseline:  Goal status: INITIAL    LONG TERM GOALS: Target date: 03/17/2023    MMT to improve by at least one grade in all weak groups  Baseline:  Goal status: INITIAL  2.  Pain to be no more than 4/10 at worst  Baseline:  Goal status: INITIAL  3.  Will have been able to return to outdoor exercise and fishing trips with no increase in pain from resting levels and no subjective unsteadiness over uneven surfaces to improve QOL  Baseline:  Goal status: INITIAL  4.  Will be able to stand statically for at least 30 minutes without increase in pain from resting levels to allow her to do functional tasks at home such as cooking  Baseline:  Goal status:  INITIAL  5.  FOTO score to have met or exceeded predicted value by time of DC  Baseline:  Goal status: INITIAL    PLAN:  PT FREQUENCY: 2x/week  PT DURATION: 6 weeks  PLANNED INTERVENTIONS: 97164- PT Re-evaluation, 97110-Therapeutic exercises, 97530- Therapeutic activity, [id number]- Neuromuscular re-education, 97535- Self Care, 47829- Manual therapy, [id number]- Gait training, [id number]- Aquatic Therapy, 97014- Electrical stimulation (unattended), Taping, Dry Needling, Cryotherapy, and Moist heat.  PLAN FOR NEXT SESSION: functional strengthening and flexibility with appropriate progressions, core work and biomechanics. Balance work PRN   Nedra Hai, PT, DPT 02/14/23 2:29 PM    Date of referral: 01/13/23 Referring provider: Nadara Mustard, MD Referring diagnosis? Diagnosis M54.50,G89.29 (ICD-10-CM) - Chronic left-sided low back pain without sciatica M25.562,G89.29 (ICD-10-CM) - Chronic pain of left knee Treatment diagnosis? (if different than referring diagnosis) M54.59, M79.605, M62.81, R29.898  What was this (referring dx) caused by? Ongoing Issue  Ashby Dawes of Condition: Initial Onset (within last 3 months)   Laterality: Lt  Current Functional Measure Score: FOTO 48  Objective measurements identify impairments when they are compared to normal values, the uninvolved extremity, and prior level of function.  [x]  Yes  []  No  Objective assessment of functional ability: Moderate functional limitations   Briefly describe symptoms: pain in L knee coming from the back, tends to really hurt and become painful with static standing, OK with movement   How did symptoms start: insidiously   Average pain intensity:  Last 24 hours: 7/10  Past week: 8/10  How often does the pt experience symptoms? Frequently  How much have the symptoms interfered with usual daily activities? Quite a bit  How has condition changed since care began at this facility? A little worse  In general, how is the  patients overall health? Good   BACK PAIN (STarT Back Screening Tool) Has pain spread down the leg(s) at some time in the last 2 weeks? Yes  Has there been pain in the shoulder or neck at some time in the last 2 weeks? Yes  Has the pt only walked short distances because of back pain? Yes  Does patient have worrying thoughts a lot of the time? Yes  Has patient stopped enjoying things they usually enjoy? Yes

## 2023-02-21 ENCOUNTER — Encounter: Payer: Medicare Other | Admitting: Rehabilitative and Restorative Service Providers"

## 2023-02-25 ENCOUNTER — Encounter: Payer: Self-pay | Admitting: Rehabilitative and Restorative Service Providers"

## 2023-02-25 ENCOUNTER — Ambulatory Visit: Payer: Medicare Other | Admitting: Rehabilitative and Restorative Service Providers"

## 2023-02-25 DIAGNOSIS — M79605 Pain in left leg: Secondary | ICD-10-CM | POA: Diagnosis not present

## 2023-02-25 DIAGNOSIS — M5459 Other low back pain: Secondary | ICD-10-CM | POA: Diagnosis not present

## 2023-02-25 DIAGNOSIS — R29898 Other symptoms and signs involving the musculoskeletal system: Secondary | ICD-10-CM | POA: Diagnosis not present

## 2023-02-25 DIAGNOSIS — M6281 Muscle weakness (generalized): Secondary | ICD-10-CM

## 2023-02-25 NOTE — Therapy (Signed)
 OUTPATIENT PHYSICAL THERAPY TREATMENT   Patient Name: Sue Mitchell MRN: [id number] DOB:1955-05-22, 67 y.o., female Today's Date: 02/25/2023  END OF SESSION:  PT End of Session - 02/25/23 1141     Visit Number 3    Number of Visits 13    Date for PT Re-Evaluation 03/17/23    Authorization Type U[employer] MCR    Authorization Time Period 02/03/23 to 04/20/2022    Authorization - Number of Visits 13    Progress Note Due on Visit 10    PT Start Time 1135    PT Stop Time 1215    PT Time Calculation (min) 40 min    Activity Tolerance Patient tolerated treatment well    Behavior During Therapy [facility] for tasks assessed/performed               Past Medical History:  Diagnosis Date   Abdominal distension    Abdominal pain    Arthritis    Asthma    Biliary colic    COPD (chronic obstructive pulmonary disease) (HCC)    Diarrhea    GERD (gastroesophageal reflux disease)    Hypertension    Nausea    Past Surgical History:  Procedure Laterality Date   cartilage removal  2007   left knee   CESAREAN SECTION  1976   LAPAROSCOPY  1992 (approx)    abdominal   Patient Active Problem List   Diagnosis Date Noted   Former smoker 01/07/2022   Asthmatic bronchitis with exacerbation 11/21/2020   Atypical chest pain c/w IBS/ recurrent 09/27/2020   Healthcare maintenance 02/07/2020   Shoulder pain, left 01/14/2019   Pulmonary infiltrate 01/14/2019   Acute respiratory failure with hypoxia (HCC) 04/14/2018   Hemoptysis 04/11/2018   Influenza 04/11/2018   Cough 04/11/2018   Acute maxillary sinusitis 02/20/2018   Rhinitis, chronic 12/04/2017   Cough variant asthma  vs uacs with vcd 12/26/2016    PCP: Benjamine Aland MD   REFERRING PROVIDER: Harden Jerona GAILS, MD  REFERRING DIAG: Diagnosis M54.50,G89.29 (ICD-10-CM) - Chronic left-sided low back pain without sciatica M25.562,G89.29 (ICD-10-CM) - Chronic pain of left knee  Rationale for Evaluation and Treatment: Rehabilitation  THERAPY  DIAG:  Other low back pain  Pain in left leg  Other symptoms and signs involving the musculoskeletal system  Muscle weakness (generalized)  ONSET DATE: chronic I've been having problems with the left side for awhile   SUBJECTIVE:  SUBJECTIVE STATEMENT: Pt indicated having pain in back increase in last few days , not sure why.  Indicated having sharper burning pain in upper mid back that was new in last few days.  Reported putting biofreeze on it and heating pad, helps alittle.   PERTINENT HISTORY:  See above   PAIN:   NPRS scale: 6-7/10 Pain location: low back, upper mid back Pain description: pulling  Aggravating factors: sometimes walking Relieving factors: unsure    PRECAUTIONS: None  RED FLAGS: None   WEIGHT BEARING RESTRICTIONS: No  FALLS:  Has patient fallen in last 6 months? 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  DIAGNOSTIC FINDINGS:  02/03/2023 2 view radiographs of the lumbar spine shows degenerative disc disease  with a grade 1 spondylolisthesis at L4-5.  PATIENT SURVEYS:  02/03/2023 FOTO 48, predicted 52 in 13 visits   SCREENING FOR RED FLAGS: 02/03/2023 Bowel or bladder incontinence: No Spinal tumors: No Cauda equina syndrome: No Compression fracture:  No Abdominal aneurysm: No  COGNITION: 02/03/2023 Overall cognitive status: Within functional limits for tasks assessed     SENSATION: 02/03/2023 Not tested  MUSCLE LENGTH: 02/03/2023 Hip flexors moderately tight B Quads moderate limitation B  Hamstrings moderate limitation B Piriformis mild limitation B   POSTURE: 02/03/2023 rounded shoulders, forward head, and increased thoracic kyphosis  PALPATION: 02/03/2023 Lumbar paraspinals tender L4-S1 level, proximal HS attachment   LUMBAR ROM:   AROM 02/03/2023 02/25/2023   Flexion WNL; RFIS no increase in pain     Extension Somewhat hypermobile  WFL 100%  Right lateral flexion Mild limitation     Left lateral flexion Mild limitation    Right rotation Mild limitaiton    Left rotation Mild limitation     (Blank rows = not tested)    LOWER EXTREMITY MMT:    MMT Right 02/03/2023 Left 02/03/2023  Hip flexion 4 4  Hip extension 3+ 3+  Hip abduction 3 3  Hip adduction    Hip internal rotation    Hip external rotation    Knee flexion 4 4  Knee extension 4+ 4+  Ankle dorsiflexion 4 4  Ankle plantarflexion    Ankle inversion    Ankle eversion     (Blank rows = not tested)    FUNCTIONAL TESTS:  02/03/2023 Timed up and go (TUG): 7.8 seconds no device                    TODAY'S TREATMENT:                                                                DATE: 02/25/2023 Therex: Nustep lvl 5 UE/LE 10 mins for ROM Standing lumbar extension AROM x 5 (improved lumbar symptoms reported) Tband green rows 2 x 10 bilateral Tband gh ext 2  x 10 bilateral Supine lumbar trunk rotation stretch 15 sec x 4 bilateral Supine bridge 2 x 10 2-3 sec hold  Supine clam shell c isometric hold opposite leg x 20 each, green band Supine hooklying piriformis pull towards stretch 30 sec x 2 bilateral     TODAY'S TREATMENT:                                                                DATE: 02/14/23  TherEx  Nustep L4 x8 minutes BLEs only  QL  stretches 3 way x30 seconds each HS stretches 2x30 seconds B  Seated piriformis stretch 2x30 seconds B  Bridges x15 Sidelying clams red TB x12  PPT 12x3 second holds Supine TA sets + B march x15  Sciatic flossing x20 supine with gait belt     02/03/23- eval, POC, HEP  Scifit bike L4 x6 minutes BLEs only  Bridges x10  Figure 4 stretch supine 2x30 seconds B Hip flexor stretch 2x30 seconds B  TA set + marching x6   PATIENT EDUCATION:  Education details: exam findings, POC, HEP  Person educated: Patient Education method: Programmer, Multimedia, Demonstration, and Handouts Education comprehension: verbalized understanding, returned demonstration, and needs further education  HOME EXERCISE PROGRAM: Access Code: [email or web address] URL: [email or web address] Date: 02/03/2023 Prepared by: Josette Rough  Exercises - Supine Bridge  - 1 x daily - 7 x weekly - 3 sets - 10 reps - Supine Figure 4 Piriformis Stretch  - 1 x daily - 7 x weekly - 3 sets - 10 reps - Modified Thomas Stretch  - 1 x daily - 7 x weekly - 3 sets - 10 reps - Seated Transversus Abdominus Bracing + March  - 1 x daily - 7 x weekly - 3 sets - 10 reps  ASSESSMENT:  CLINICAL IMPRESSION: Report of symptoms in back noted upon arrival today so treatment focus on those areas to help reduce pain.  Positive benefit in pain reduction noted with movement in lumbar and upper thoracic region as noted in activity today.  Continued skilled PT services warranted.    OBJECTIVE IMPAIRMENTS: Abnormal gait, decreased activity tolerance, decreased ROM, decreased strength, increased fascial restrictions, impaired flexibility, improper body mechanics, postural dysfunction, and pain.   ACTIVITY LIMITATIONS: carrying, lifting, bending, standing, stairs, transfers, bed mobility, locomotion level, and caring for others  PARTICIPATION LIMITATIONS: meal prep, cleaning, laundry, driving, shopping, community activity, and occupation  PERSONAL  FACTORS: Age, Behavior pattern, Education, Fitness, Past/current experiences, Profession, Sex, Social background, and Time since onset of injury/illness/exacerbation are also affecting patient's functional outcome.   REHAB POTENTIAL: Fair chronicity of impairments   CLINICAL DECISION MAKING: Stable/uncomplicated  EVALUATION COMPLEXITY: Low   GOALS: Goals reviewed with patient? No  SHORT TERM GOALS: Target date: 02/24/2023    Will be compliant with appropriate progressive HEP  Baseline: Goal status: Met  2.  Will demonstrate good biomechanics for bed mobility and floor to waist lifting  Baseline:  Goal status: Met  3.  Flexibility impairments to have improved by at least 50% Baseline:  Goal status: partially met    LONG TERM GOALS: Target date: 03/17/2023    MMT to improve by at least one grade in all weak groups  Baseline:  Goal status: INITIAL  2.  Pain to be no  more than 4/10 at worst  Baseline:  Goal status: INITIAL  3.  Will have been able to return to outdoor exercise and fishing trips with no increase in pain from resting levels and no subjective unsteadiness over uneven surfaces to improve QOL  Baseline:  Goal status: INITIAL  4.  Will be able to stand statically for at least 30 minutes without increase in pain from resting levels to allow her to do functional tasks at home such as cooking  Baseline:  Goal status: INITIAL  5.  FOTO score to have met or exceeded predicted value by time of DC  Baseline:  Goal status: INITIAL    PLAN:  PT FREQUENCY: 2x/week  PT DURATION: 6 weeks  PLANNED INTERVENTIONS: 97164- PT Re-evaluation, 97110-Therapeutic exercises, 97530- Therapeutic activity, [id number]- Neuromuscular re-education, 97535- Self Care, [id number]- Manual therapy, [id number]- Gait training, [id number]- Aquatic Therapy, 97014- Electrical stimulation (unattended), Taping, Dry Needling, Cryotherapy, and Moist heat.  PLAN FOR NEXT SESSION: Recheck back symptom reporting.   Progress general strength and activity tolerance as able.  FOTO update around 30 days/LTG assessment.   Ozell Silvan, PT, DPT, OCS, ATC 02/25/23  12:12 PM      Date of referral: 01/13/23 Referring provider: Harden Jerona GAILS, MD Referring diagnosis? Diagnosis M54.50,G89.29 (ICD-10-CM) - Chronic left-sided low back pain without sciatica M25.562,G89.29 (ICD-10-CM) - Chronic pain of left knee Treatment diagnosis? (if different than referring diagnosis) M54.59, M79.605, M62.81, R29.898  What was this (referring dx) caused by? Ongoing Issue  Lysle of Condition: Initial Onset (within last 3 months)   Laterality: Lt  Current Functional Measure Score: FOTO 48  Objective measurements identify impairments when they are compared to normal values, the uninvolved extremity, and prior level of function.  [x]  Yes  []  No  Objective assessment of functional ability: Moderate functional limitations   Briefly describe symptoms: pain in L knee coming from the back, tends to really hurt and become painful with static standing, OK with movement   How did symptoms start: insidiously   Average pain intensity:  Last 24 hours: 7/10  Past week: 8/10  How often does the pt experience symptoms? Frequently  How much have the symptoms interfered with usual daily activities? Quite a bit  How has condition changed since care began at this facility? A little worse  In general, how is the patients overall health? Good   BACK PAIN (STarT Back Screening Tool) Has pain spread down the leg(s) at some time in the last 2 weeks? Yes  Has there been pain in the shoulder or neck at some time in the last 2 weeks? Yes  Has the pt only walked short distances because of back pain? Yes  Does patient have worrying thoughts a lot of the time? Yes  Has patient stopped enjoying things they usually enjoy? Yes

## 2023-02-27 ENCOUNTER — Ambulatory Visit: Payer: Medicare Other | Admitting: Rehabilitative and Restorative Service Providers"

## 2023-02-27 ENCOUNTER — Encounter: Payer: Self-pay | Admitting: Rehabilitative and Restorative Service Providers"

## 2023-02-27 DIAGNOSIS — M79605 Pain in left leg: Secondary | ICD-10-CM | POA: Diagnosis not present

## 2023-02-27 DIAGNOSIS — R29898 Other symptoms and signs involving the musculoskeletal system: Secondary | ICD-10-CM

## 2023-02-27 DIAGNOSIS — M6281 Muscle weakness (generalized): Secondary | ICD-10-CM | POA: Diagnosis not present

## 2023-02-27 DIAGNOSIS — M5459 Other low back pain: Secondary | ICD-10-CM

## 2023-02-27 NOTE — Therapy (Signed)
 OUTPATIENT PHYSICAL THERAPY TREATMENT   Patient Name: Sue Mitchell MRN: [id number] DOB:1955/09/29, 68 y.o., female Today's Date: 02/27/2023  END OF SESSION:  PT End of Session - 02/27/23 1819     Visit Number 4    Number of Visits 13    Date for PT Re-Evaluation 03/17/23    Authorization Type [employer] MCR    Authorization Time Period 02/03/23 to 04/20/2022    Authorization - Number of Visits 13    Progress Note Due on Visit 10    PT Start Time 1100    PT Stop Time 1142    PT Time Calculation (min) 42 min    Activity Tolerance Patient tolerated treatment well;No increased pain    Behavior During Therapy WFL for tasks assessed/performed                Past Medical History:  Diagnosis Date   Abdominal distension    Abdominal pain    Arthritis    Asthma    Biliary colic    COPD (chronic obstructive pulmonary disease) (HCC)    Diarrhea    GERD (gastroesophageal reflux disease)    Hypertension    Nausea    Past Surgical History:  Procedure Laterality Date   cartilage removal  2007   left knee   CESAREAN SECTION  1976   LAPAROSCOPY  1992 (approx)    abdominal   Patient Active Problem List   Diagnosis Date Noted   Former smoker 01/07/2022   Asthmatic bronchitis with exacerbation 11/21/2020   Atypical chest pain c/w IBS/ recurrent 09/27/2020   Healthcare maintenance 02/07/2020   Shoulder pain, left 01/14/2019   Pulmonary infiltrate 01/14/2019   Acute respiratory failure with hypoxia (HCC) 04/14/2018   Hemoptysis 04/11/2018   Influenza 04/11/2018   Cough 04/11/2018   Acute maxillary sinusitis 02/20/2018   Rhinitis, chronic 12/04/2017   Cough variant asthma  vs uacs with vcd 12/26/2016    PCP: Benjamine Aland MD   REFERRING PROVIDER: Harden Jerona GAILS, MD  REFERRING DIAG: Diagnosis M54.50,G89.29 (ICD-10-CM) - Chronic left-sided low back pain without sciatica M25.562,G89.29 (ICD-10-CM) - Chronic pain of left knee  Rationale for Evaluation and Treatment:  Rehabilitation  THERAPY DIAG:  Other low back pain  Pain in left leg  Other symptoms and signs involving the musculoskeletal system  Muscle weakness (generalized)  ONSET DATE: chronic I've been having problems with the left side for awhile   SUBJECTIVE:  SUBJECTIVE STATEMENT: [name] notes continued compliance with her home exercises.  She notes more stiffness with the cold weather.  PERTINENT HISTORY:  See above   PAIN:   NPRS scale: 4-7/10 over the past week Pain location: low back, upper mid back Pain description: pulling  Aggravating factors: sometimes walking Relieving factors: unsure    PRECAUTIONS: None  RED FLAGS: None   WEIGHT BEARING RESTRICTIONS: No  FALLS:  Has patient fallen in last 6 months? 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  DIAGNOSTIC FINDINGS:  02/03/2023 2 view radiographs of the lumbar spine shows degenerative disc disease  with a grade 1 spondylolisthesis at L4-5.  PATIENT SURVEYS:  02/03/2023 FOTO 48, predicted 52 in 13 visits   SCREENING FOR RED FLAGS: 02/03/2023 Bowel or bladder incontinence: No Spinal tumors: No Cauda equina syndrome: No Compression fracture: No Abdominal aneurysm: No  COGNITION: 02/03/2023 Overall cognitive status:  Within functional limits for tasks assessed     SENSATION: 02/03/2023 Not tested  MUSCLE LENGTH: 02/03/2023 Hip flexors moderately tight B Quads moderate limitation B  Hamstrings moderate limitation B Piriformis mild limitation B   POSTURE: 02/03/2023 rounded shoulders, forward head, and increased thoracic kyphosis  PALPATION: 02/03/2023 Lumbar paraspinals tender L4-S1 level, proximal HS attachment   LUMBAR ROM:   AROM 02/03/2023 02/25/2023   Flexion WNL; RFIS no increase in pain     Extension Somewhat hypermobile  WFL 100%  Right lateral flexion Mild limitation     Left lateral flexion Mild limitation    Right rotation Mild limitaiton    Left rotation Mild limitation     (Blank rows = not tested)    LOWER EXTREMITY MMT:    MMT Right 02/03/2023 Left 02/03/2023  Hip flexion 4 4  Hip extension 3+ 3+  Hip abduction 3 3  Hip adduction    Hip internal rotation    Hip external rotation    Knee flexion 4 4  Knee extension 4+ 4+  Ankle dorsiflexion 4 4  Ankle plantarflexion    Ankle inversion    Ankle eversion     (Blank rows = not tested)    FUNCTIONAL TESTS:  02/03/2023 Timed up and go (TUG): 7.8 seconds no device                    TODAY'S TREATMENT:                                                                DATE:  02/26/2022 Modified Thomas stretch 4 x 20 seconds bilaterally Figure 4 stretch (push) 4 x 20 seconds bilaterally Bridging 2 sets of 10 x 5 seconds Trunk extension AROM 10 x 3 seconds Hip hike at counter top 2 sets of 10 x 3 seconds Heel to toe raises with transversus abdominis activation  10 x 3 seconds  Functional Activities: Postural education including log roll, golfer's lift and education regarding avoiding prolonged postures, slouched postures, bending lifting and twisting   02/25/2023 Therex: Nustep lvl 5 UE/LE 10 mins for ROM Standing lumbar extension AROM x 5 (improved lumbar symptoms reported) Tband green rows 2 x 10  bilateral Tband gh ext 2 x 10 bilateral Supine lumbar trunk rotation stretch 15 sec x 4 bilateral Supine bridge 2 x 10 2-3 sec hold  Supine clam shell c isometric hold opposite leg x 20 each, green band Supine hooklying piriformis pull towards stretch 30 sec x 2 bilateral    02/14/23 TherEx  Nustep L4 x8 minutes BLEs only  QL stretches 3 way x30 seconds each HS stretches 2x30 seconds B  Seated piriformis stretch 2x30 seconds B  Bridges x15 Sidelying clams red TB x12  PPT 12x3 second holds Supine TA sets + B march x15  Sciatic flossing x20 supine with gait belt   PATIENT EDUCATION:  Education details: exam findings, POC, HEP  Person educated: Patient Education method: Programmer, Multimedia, Facilities Manager, and Handouts Education comprehension: verbalized understanding, returned demonstration, and needs further education  HOME EXERCISE PROGRAM: Access Code: [email or web address] URL: [email or web address] Date: 02/27/2023 Prepared by: Lamar Ivory  Exercises - Supine Bridge  - 1 x daily - 7 x weekly - 3 sets - 10 reps - 5 seconds hold - Supine Figure 4 Piriformis Stretch  - 1 x daily - 7 x weekly - 3 sets - 10 reps - 10 seconds hold - Modified Thomas Stretch  - 1 x daily - 7 x weekly - 3 sets - 10 reps - 10 seconds hold - Seated Transversus Abdominus Bracing + March  - 1 x daily - 7 x weekly - 3 sets - 10 reps - Standing Lumbar Extension at Wall - Forearms  - 5 x daily - 7 x weekly - 1 sets - 5 reps - 3 seconds hold - Heel Toe Raises with Counter Support  - 2-3 x daily - 7 x weekly - 1 sets - 10 reps - 3 seconds hold - Standing Hip Hiking  - 1 x daily - 7 x weekly - 2-3 sets - 10 reps - 3 seconds hold  ASSESSMENT:  CLINICAL IMPRESSION: Tattianna did a good job recalling her earlier home exercises and with progressions added today.  We progressed her sitting transversus abdominis rise to standing and added more postural strengthening activities with hip hiking.  We spent some time  reviewing posture and body mechanics work so that Shanele can implement this into her daily activities for less pain while she is working on strength and more long-term benefits.  She did a good job with progressions today and felt as if she had less lumbar strain with postural and strength changes today.  Her prognosis remains good to meet long-term goals with the current plan of care.   OBJECTIVE IMPAIRMENTS: Abnormal gait, decreased activity tolerance, decreased ROM, decreased strength, increased fascial restrictions, impaired flexibility, improper body mechanics, postural dysfunction, and pain.   ACTIVITY LIMITATIONS: carrying, lifting, bending, standing, stairs, transfers, bed mobility, locomotion level, and caring for others  PARTICIPATION LIMITATIONS: meal prep, cleaning, laundry, driving, shopping, community activity, and occupation  PERSONAL FACTORS: Age, Behavior pattern, Education, Fitness, Past/current experiences, Profession, Sex, Social background, and Time since onset of injury/illness/exacerbation are also affecting patient's functional outcome.   REHAB POTENTIAL: Fair chronicity of impairments   CLINICAL DECISION MAKING: Stable/uncomplicated  EVALUATION COMPLEXITY: Low   GOALS: Goals reviewed  with patient? No  SHORT TERM GOALS: Target date: 02/24/2023    Will be compliant with appropriate progressive HEP  Baseline: Goal status: Met  2.  Will demonstrate good biomechanics for bed mobility and floor to waist lifting  Baseline:  Goal status: Met  3.  Flexibility impairments to have improved by at least 50% Baseline:  Goal status: partially met    LONG TERM GOALS: Target date: 03/17/2023    MMT to improve by at least one grade in all weak groups  Baseline:  Goal status: INITIAL  2.  Pain to be no more than 4/10 at worst  Baseline:  Goal status: INITIAL  3.  Will have been able to return to outdoor exercise and fishing trips with no increase in pain from  resting levels and no subjective unsteadiness over uneven surfaces to improve QOL  Baseline:  Goal status: INITIAL  4.  Will be able to stand statically for at least 30 minutes without increase in pain from resting levels to allow her to do functional tasks at home such as cooking  Baseline:  Goal status: INITIAL  5.  FOTO score to have met or exceeded predicted value by time of DC  Baseline:  Goal status: INITIAL    PLAN:  PT FREQUENCY: 2x/week  PT DURATION: 6 weeks  PLANNED INTERVENTIONS: 97164- PT Re-evaluation, 97110-Therapeutic exercises, 97530- Therapeutic activity, [id number]- Neuromuscular re-education, 97535- Self Care, [id number]- Manual therapy, [id number]- Gait training, [id number]- Aquatic Therapy, 97014- Electrical stimulation (unattended), Taping, Dry Needling, Cryotherapy, and Moist heat.  PLAN FOR NEXT SESSION: Posture, body mechanics and low back strength progressiosn to meet long-term goals.  FOTO update around 30 days/LTG assessment.   Myer LELON Ivory PT, MPT 02/27/23  6:26 PM      Date of referral: 01/13/23 Referring provider: Harden Jerona GAILS, MD Referring diagnosis? Diagnosis M54.50,G89.29 (ICD-10-CM) - Chronic left-sided low back pain without sciatica M25.562,G89.29 (ICD-10-CM) - Chronic pain of left knee Treatment diagnosis? (if different than referring diagnosis) M54.59, M79.605, M62.81, R29.898  What was this (referring dx) caused by? Ongoing Issue  Lysle of Condition: Initial Onset (within last 3 months)   Laterality: Lt  Current Functional Measure Score: FOTO 48  Objective measurements identify impairments when they are compared to normal values, the uninvolved extremity, and prior level of function.  [x]  Yes  []  No  Objective assessment of functional ability: Moderate functional limitations   Briefly describe symptoms: pain in L knee coming from the back, tends to really hurt and become painful with static standing, OK with movement   How did symptoms start:  insidiously   Average pain intensity:  Last 24 hours: 7/10  Past week: 8/10  How often does the pt experience symptoms? Frequently  How much have the symptoms interfered with usual daily activities? Quite a bit  How has condition changed since care began at this facility? A little worse  In general, how is the patients overall health? Good   BACK PAIN (STarT Back Screening Tool) Has pain spread down the leg(s) at some time in the last 2 weeks? Yes  Has there been pain in the shoulder or neck at some time in the last 2 weeks? Yes  Has the pt only walked short distances because of back pain? Yes  Does patient have worrying thoughts a lot of the time? Yes  Has patient stopped enjoying things they usually enjoy? Yes

## 2023-03-03 ENCOUNTER — Ambulatory Visit: Payer: Medicare Other | Admitting: Orthopedic Surgery

## 2023-03-03 DIAGNOSIS — M5442 Lumbago with sciatica, left side: Secondary | ICD-10-CM | POA: Diagnosis not present

## 2023-03-03 DIAGNOSIS — M25562 Pain in left knee: Secondary | ICD-10-CM | POA: Diagnosis not present

## 2023-03-03 DIAGNOSIS — G8929 Other chronic pain: Secondary | ICD-10-CM | POA: Diagnosis not present

## 2023-03-05 ENCOUNTER — Ambulatory Visit: Payer: Medicare Other | Admitting: Rehabilitative and Restorative Service Providers"

## 2023-03-05 ENCOUNTER — Encounter: Payer: Self-pay | Admitting: Rehabilitative and Restorative Service Providers"

## 2023-03-05 DIAGNOSIS — M79605 Pain in left leg: Secondary | ICD-10-CM | POA: Diagnosis not present

## 2023-03-05 DIAGNOSIS — R29898 Other symptoms and signs involving the musculoskeletal system: Secondary | ICD-10-CM

## 2023-03-05 DIAGNOSIS — M5459 Other low back pain: Secondary | ICD-10-CM | POA: Diagnosis not present

## 2023-03-05 DIAGNOSIS — M6281 Muscle weakness (generalized): Secondary | ICD-10-CM | POA: Diagnosis not present

## 2023-03-05 NOTE — Therapy (Signed)
 OUTPATIENT PHYSICAL THERAPY TREATMENT   Patient Name: Sue Mitchell MRN: [id number] DOB:01-09-1956, 68 y.o., female Today's Date: 03/05/2023  END OF SESSION:  PT End of Session - 03/05/23 1314     Visit Number 5    Number of Visits 13    Date for PT Re-Evaluation 03/17/23    Authorization Type [employer] MCR    Authorization Time Period 02/03/23 to 04/20/2022    Authorization - Number of Visits 13    Progress Note Due on Visit 10    PT Start Time 1313    PT Stop Time 1344    PT Time Calculation (min) 31 min    Activity Tolerance Patient tolerated treatment well;No increased pain    Behavior During Therapy WFL for tasks assessed/performed              Past Medical History:  Diagnosis Date   Abdominal distension    Abdominal pain    Arthritis    Asthma    Biliary colic    COPD (chronic obstructive pulmonary disease) (HCC)    Diarrhea    GERD (gastroesophageal reflux disease)    Hypertension    Nausea    Past Surgical History:  Procedure Laterality Date   cartilage removal  2007   left knee   CESAREAN SECTION  1976   LAPAROSCOPY  1992 (approx)    abdominal   Patient Active Problem List   Diagnosis Date Noted   Former smoker 01/07/2022   Asthmatic bronchitis with exacerbation 11/21/2020   Atypical chest pain c/w IBS/ recurrent 09/27/2020   Healthcare maintenance 02/07/2020   Shoulder pain, left 01/14/2019   Pulmonary infiltrate 01/14/2019   Acute respiratory failure with hypoxia (HCC) 04/14/2018   Hemoptysis 04/11/2018   Influenza 04/11/2018   Cough 04/11/2018   Acute maxillary sinusitis 02/20/2018   Rhinitis, chronic 12/04/2017   Cough variant asthma  vs uacs with vcd 12/26/2016    PCP: Benjamine Aland MD   REFERRING PROVIDER: Harden Jerona GAILS, MD  REFERRING DIAG: Diagnosis M54.50,G89.29 (ICD-10-CM) - Chronic left-sided low back pain without sciatica M25.562,G89.29 (ICD-10-CM) - Chronic pain of left knee  Rationale for Evaluation and Treatment:  Rehabilitation  THERAPY DIAG:  Other low back pain  Pain in left leg  Other symptoms and signs involving the musculoskeletal system  Muscle weakness (generalized)  ONSET DATE: chronic I've been having problems with the left side for awhile   SUBJECTIVE:  SUBJECTIVE STATEMENT: Sue Mitchell notes significant progress with her symptoms over the weekend and through today.  She continues to have good compliance with her home exercises.    PERTINENT HISTORY:  See above   PAIN:  NPRS scale: 2-4/10 over the past week Pain location: low back, upper mid back Pain description: pulling  Aggravating factors: sometimes walking Relieving factors: Exercise and postural awareness    PRECAUTIONS: None  RED FLAGS: None   WEIGHT BEARING RESTRICTIONS: No  FALLS:  Has patient fallen in last 6 months? 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  DIAGNOSTIC FINDINGS:  02/03/2023 2 view radiographs of the lumbar spine shows degenerative disc disease  with a grade 1 spondylolisthesis at L4-5.  PATIENT SURVEYS:  02/03/2023 FOTO 48, predicted 52 in 13 visits   SCREENING FOR RED FLAGS: 02/03/2023 Bowel or bladder incontinence: No Spinal tumors: No Cauda equina syndrome: No Compression fracture:  No Abdominal aneurysm: No  COGNITION: 02/03/2023 Overall cognitive status: Within functional limits for tasks assessed     SENSATION: 02/03/2023 Not tested  MUSCLE LENGTH: 02/03/2023 Hip flexors moderately tight B Quads moderate limitation B  Hamstrings moderate limitation B Piriformis mild limitation B   POSTURE: 02/03/2023 rounded shoulders, forward head, and increased thoracic kyphosis  PALPATION: 02/03/2023 Lumbar paraspinals tender L4-S1 level, proximal HS attachment   LUMBAR ROM:   AROM 02/03/2023 02/25/2023   Flexion WNL; RFIS no increase in pain     Extension Somewhat hypermobile  WFL 100%  Right lateral flexion Mild limitation     Left lateral flexion Mild limitation    Right rotation Mild limitaiton    Left rotation Mild limitation     (Blank rows = not tested)    LOWER EXTREMITY MMT:    MMT Right 02/03/2023 Left 02/03/2023  Hip flexion 4 4  Hip extension 3+ 3+  Hip abduction 3 3  Hip adduction    Hip internal rotation    Hip external rotation    Knee flexion 4 4  Knee extension 4+ 4+  Ankle dorsiflexion 4 4  Ankle plantarflexion    Ankle inversion    Ankle eversion     (Blank rows = not tested)    FUNCTIONAL TESTS:  02/03/2023 Timed up and go (TUG): 7.8 seconds no device                    TODAY'S TREATMENT:                                                                DATE:  03/05/2023 Modified Thomas stretch 2 x 20 seconds bilaterally Single knee to chest 1 x 20 seconds bilaterally Figure 4 stretch (push) 2 x 20 seconds bilaterally Supine hamstrings stretch 2 x 20 seconds bilaterally Bridging 10 x 5 seconds Trunk extension  AROM 10 x 3 seconds Hip hike at counter top 2 sets of 10 x 3 seconds Heel to toe raises with transversus abdominis activation and foam block to encourage transversus abdominus activation 10 x 3 seconds  Functional Activities: Reviewed log roll, golfer's lift and the importance of avoiding prolonged postures, slouched  postures, bending lifting and twisting   02/27/2023 Modified Thomas stretch 4 x 20 seconds bilaterally Figure 4 stretch (push) 4 x 20 seconds bilaterally Bridging 2 sets of 10 x 5 seconds Trunk extension AROM 10 x 3 seconds Hip hike at counter top 2 sets of 10 x 3 seconds Heel to toe raises with transversus abdominis activation 10 x 3 seconds  Functional Activities: Postural education including log roll, golfer's lift and education regarding avoiding prolonged postures, slouched postures, bending lifting and twisting   02/25/2023 Therex: Nustep lvl 5 UE/LE 10 mins for ROM Standing lumbar extension AROM x 5 (improved lumbar symptoms reported) Tband green rows 2 x 10 bilateral Tband gh ext 2 x 10 bilateral Supine lumbar trunk rotation stretch 15 sec x 4 bilateral Supine bridge 2 x 10 2-3 sec hold  Supine clam shell c isometric hold opposite leg x 20 each, green band Supine hooklying piriformis pull towards stretch 30 sec x 2 bilateral    PATIENT EDUCATION:  Education details: exam findings, POC, HEP  Person educated: Patient Education method: Programmer, Multimedia, Demonstration, and Handouts Education comprehension: verbalized understanding, returned demonstration, and needs further education  HOME EXERCISE PROGRAM: Access Code: [email or web address] URL: [email or web address] Date: 03/05/2023 Prepared by: Lamar Ivory  Exercises - Supine Bridge  - 1 x daily - 7 x weekly - 3 sets - 10 reps - 5 seconds hold - Supine Figure 4 Piriformis Stretch  - 1 x daily - 7 x weekly - 3 sets - 10 reps - 10 seconds hold - Modified Thomas Stretch  - 1 x daily - 7 x weekly - 3 sets - 10 reps - 10 seconds hold - Seated Transversus Abdominus Bracing + March  - 1 x daily - 7 x weekly - 3 sets - 10 reps - Standing Lumbar Extension at Wall - Forearms  - 5 x daily - 7 x weekly - 1 sets - 5 reps - 3 seconds hold - Heel Toe Raises with Counter Support  - 2-3 x daily - 7 x weekly - 1 sets - 10 reps - 3  seconds hold - Standing Hip Hiking  - 1 x daily - 7 x weekly - 2-3 sets - 10 reps - 3 seconds hold - Single Knee to Chest Stretch  - 1 x daily - 7 x weekly - 1 sets - 4-5 reps - 20 seconds hold - Supine Hamstring Stretch  - 1 x daily - 7 x weekly - 1 sets - 4-5 reps - 20 seconds hold  ASSESSMENT:  CLINICAL IMPRESSION: Liliauna notes significant functional progress over the past 4-5 days.  We continued our review of posture and body mechanics work so that Kiari can continue these with her daily activities.  Continue proper strength and postural education interventions to reduce lumbar strain and meet long-term goals.   OBJECTIVE IMPAIRMENTS: Abnormal gait, decreased activity tolerance, decreased ROM, decreased strength, increased fascial restrictions, impaired flexibility, improper body mechanics, postural dysfunction, and pain.   ACTIVITY LIMITATIONS: carrying, lifting, bending, standing, stairs, transfers, bed mobility, locomotion level, and caring for others  PARTICIPATION LIMITATIONS: meal prep, cleaning, laundry, driving, shopping, community activity, and occupation  PERSONAL FACTORS: Age, Behavior pattern, Education,  Fitness, Past/current experiences, Profession, Sex, Social background, and Time since onset of injury/illness/exacerbation are also affecting patient's functional outcome.   REHAB POTENTIAL: Fair chronicity of impairments   CLINICAL DECISION MAKING: Stable/uncomplicated  EVALUATION COMPLEXITY: Low   GOALS: Goals reviewed with patient? No  SHORT TERM GOALS: Target date: 02/24/2023    Will be compliant with appropriate progressive HEP  Baseline: Goal status: Met  2.  Will demonstrate good biomechanics for bed mobility and floor to waist lifting  Baseline:  Goal status: Met  3.  Flexibility impairments to have improved by at least 50% Baseline:  Goal status: partially met    LONG TERM GOALS: Target date: 03/17/2023    MMT to improve by at least one  grade in all weak groups  Baseline:  Goal status: INITIAL  2.  Pain to be no more than 4/10 at worst  Baseline:  Goal status: Partially Met 03/05/2023  3.  Will have been able to return to outdoor exercise and fishing trips with no increase in pain from resting levels and no subjective unsteadiness over uneven surfaces to improve QOL  Baseline:  Goal status: INITIAL  4.  Will be able to stand statically for at least 30 minutes without increase in pain from resting levels to allow her to do functional tasks at home such as cooking  Baseline:  Goal status: INITIAL  5.  FOTO score to have met or exceeded predicted value by time of DC  Baseline:  Goal status: INITIAL    PLAN:  PT FREQUENCY: 2x/week  PT DURATION: 6 weeks  PLANNED INTERVENTIONS: 97164- PT Re-evaluation, 97110-Therapeutic exercises, 97530- Therapeutic activity, [id number]- Neuromuscular re-education, 97535- Self Care, [id number]- Manual therapy, [id number]- Gait training, [id number]- Aquatic Therapy, 97014- Electrical stimulation (unattended), Taping, Dry Needling, Cryotherapy, and Moist heat.  PLAN FOR NEXT SESSION: Posture, body mechanics and low back strength progressiosn to meet long-term goals.  Reassess and DC vs 1-2 additional visits if needed to meet long-term goals.  FOTO update around 30 days/LTG assessment.   Myer LELON Ivory PT, MPT 03/05/23  3:37 PM      Date of referral: 01/13/23 Referring provider: Harden Jerona GAILS, MD Referring diagnosis? Diagnosis M54.50,G89.29 (ICD-10-CM) - Chronic left-sided low back pain without sciatica M25.562,G89.29 (ICD-10-CM) - Chronic pain of left knee Treatment diagnosis? (if different than referring diagnosis) M54.59, M79.605, M62.81, R29.898  What was this (referring dx) caused by? Ongoing Issue  Lysle of Condition: Initial Onset (within last 3 months)   Laterality: Lt  Current Functional Measure Score: FOTO 48  Objective measurements identify impairments when they are compared to  normal values, the uninvolved extremity, and prior level of function.  [x]  Yes  []  No  Objective assessment of functional ability: Moderate functional limitations   Briefly describe symptoms: pain in L knee coming from the back, tends to really hurt and become painful with static standing, OK with movement   How did symptoms start: insidiously   Average pain intensity:  Last 24 hours: 7/10  Past week: 8/10  How often does the pt experience symptoms? Frequently  How much have the symptoms interfered with usual daily activities? Quite a bit  How has condition changed since care began at this facility? A little worse  In general, how is the patients overall health? Good   BACK PAIN (STarT Back Screening Tool) Has pain spread down the leg(s) at some time in the last 2 weeks? Yes  Has there been pain in the shoulder or neck at some time  in the last 2 weeks? Yes  Has the pt only walked short distances because of back pain? Yes  Does patient have worrying thoughts a lot of the time? Yes  Has patient stopped enjoying things they usually enjoy? Yes

## 2023-03-07 ENCOUNTER — Encounter: Payer: Medicare Other | Admitting: Rehabilitative and Restorative Service Providers"

## 2023-03-11 ENCOUNTER — Encounter: Payer: Self-pay | Admitting: Orthopedic Surgery

## 2023-03-11 NOTE — Progress Notes (Signed)
 Office Visit Note   Patient: Sue Mitchell           Date of Birth: Aug 14, 1955           MRN: [id number] Visit Date: 03/03/2023              Requested by: Benjamine Aland, MD [address] [place],  [place] [address] PCP: Benjamine Aland, MD  Chief Complaint  Patient presents with   Lower Back - Follow-up   Left Knee - Follow-up      HPI: Patient is a 68 year old woman is seen in follow-up for left-sided radicular pain and left knee pain.  Patient is currently going to physical therapy for core and knee strengthening.  She states this is helping a lot.  Assessment & Plan: Visit Diagnoses:  1. Chronic pain of left knee   2. Acute left-sided low back pain with left-sided sciatica     Plan: Continue physical therapy.  Follow-up for acute change in symptoms.  Follow-Up Instructions: Return if symptoms worsen or fail to improve.   Ortho Exam  Patient is alert, oriented, no adenopathy, well-dressed, normal affect, normal respiratory effort. Examination patient has no effusion of the left knee.  There is crepitation with range of motion Clauser cruciates are stable there is minimal tenderness to palpation.  Patient has a negative straight leg raise on the left no focal motor weakness.  Imaging: No results found. No images are attached to the encounter.  Labs: Lab Results  Component Value Date   REPTSTATUS 06/11/2013 FINAL 06/10/2013   CULT NO GROWTH Performed at [facility] 06/10/2013     Lab Results  Component Value Date   ALBUMIN 3.5 04/12/2018   ALBUMIN 4.5 12/13/2010    No results found for: MG No results found for: VD25OH  No results found for: PREALBUMIN    Latest Ref Rng & Units 06/25/2021    9:53 AM 04/14/2018    9:19 PM 04/14/2018    6:26 AM  CBC EXTENDED  WBC 4.0 - 10.5 K/uL 7.9  11.5  12.0   RBC 3.87 - 5.11 Mil/uL 4.59  4.12  3.80   Hemoglobin 12.0 - 15.0 g/dL 86.1  87.6  88.5   HCT 36.0 - 46.0 % 41.8  39.0  36.3   Platelets 150.0 -  400.0 K/uL 222.0  242  209   NEUT# 1.4 - 7.7 K/uL 5.0  7.4    Lymph# 0.7 - 4.0 K/uL 2.1  2.8       There is no height or weight on file to calculate BMI.  Orders:  No orders of the defined types were placed in this encounter.  No orders of the defined types were placed in this encounter.    Procedures: No procedures performed  Clinical Data: No additional findings.  ROS:  All other systems negative, except as noted in the HPI. Review of Systems  Objective: Vital Signs: There were no vitals taken for this visit.  Specialty Comments:  No specialty comments available.  PMFS History: Patient Active Problem List   Diagnosis Date Noted   Former smoker 01/07/2022   Asthmatic bronchitis with exacerbation 11/21/2020   Atypical chest pain c/w IBS/ recurrent 09/27/2020   Healthcare maintenance 02/07/2020   Shoulder pain, left 01/14/2019   Pulmonary infiltrate 01/14/2019   Acute respiratory failure with hypoxia (HCC) 04/14/2018   Hemoptysis 04/11/2018   Influenza 04/11/2018   Cough 04/11/2018   Acute maxillary sinusitis 02/20/2018   Rhinitis, chronic 12/04/2017  Cough variant asthma  vs uacs with vcd 12/26/2016   Past Medical History:  Diagnosis Date   Abdominal distension    Abdominal pain    Arthritis    Asthma    Biliary colic    COPD (chronic obstructive pulmonary disease) (HCC)    Diarrhea    GERD (gastroesophageal reflux disease)    Hypertension    Nausea     Family History  Problem Relation Age of Onset   Diabetes Father     Past Surgical History:  Procedure Laterality Date   cartilage removal  2007   left knee   CESAREAN SECTION  1976   LAPAROSCOPY  1992 (approx)    abdominal   Social History   Occupational History   Not on file  Tobacco Use   Smoking status: Former    Current packs/day: 0.00    Average packs/day: 0.5 packs/day for 25.0 years (12.5 ttl pk-yrs)    Types: Cigarettes    Start date: 37    Quit date: 2011    Years since  quitting: 14.0   Smokeless tobacco: Never  Vaping Use   Vaping status: Never Used  Substance and Sexual Activity   Alcohol use: Yes    Comment: occasional 2 per month   Drug use: No   Sexual activity: Yes

## 2023-03-13 ENCOUNTER — Other Ambulatory Visit: Payer: Self-pay

## 2023-03-13 DIAGNOSIS — K7581 Nonalcoholic steatohepatitis (NASH): Secondary | ICD-10-CM | POA: Diagnosis not present

## 2023-03-13 DIAGNOSIS — Z1211 Encounter for screening for malignant neoplasm of colon: Secondary | ICD-10-CM | POA: Diagnosis not present

## 2023-03-13 DIAGNOSIS — K219 Gastro-esophageal reflux disease without esophagitis: Secondary | ICD-10-CM | POA: Diagnosis not present

## 2023-03-13 DIAGNOSIS — K5730 Diverticulosis of large intestine without perforation or abscess without bleeding: Secondary | ICD-10-CM | POA: Diagnosis not present

## 2023-03-13 MED ORDER — ALBUTEROL SULFATE HFA 108 (90 BASE) MCG/ACT IN AERS: 2.0000 | INHALATION_SPRAY | RESPIRATORY_TRACT | 0 refills | Status: DC | PRN

## 2023-03-17 ENCOUNTER — Encounter: Payer: Self-pay | Admitting: Rehabilitative and Restorative Service Providers"

## 2023-03-17 ENCOUNTER — Encounter: Payer: Medicare Other | Admitting: Rehabilitative and Restorative Service Providers"

## 2023-03-17 ENCOUNTER — Ambulatory Visit (INDEPENDENT_AMBULATORY_CARE_PROVIDER_SITE_OTHER): Payer: Medicare Other | Admitting: Rehabilitative and Restorative Service Providers"

## 2023-03-17 DIAGNOSIS — M5459 Other low back pain: Secondary | ICD-10-CM | POA: Diagnosis not present

## 2023-03-17 DIAGNOSIS — R29898 Other symptoms and signs involving the musculoskeletal system: Secondary | ICD-10-CM | POA: Diagnosis not present

## 2023-03-17 DIAGNOSIS — M79605 Pain in left leg: Secondary | ICD-10-CM

## 2023-03-17 DIAGNOSIS — M6281 Muscle weakness (generalized): Secondary | ICD-10-CM | POA: Diagnosis not present

## 2023-03-17 NOTE — Therapy (Signed)
OUTPATIENT PHYSICAL THERAPY TREATMENT/ PROGRESS NOTE/ RECERT   Patient Name: Sue Mitchell MRN: [id number] DOB:10/05/1955, 68 y.o., female Today's Date: 03/17/2023  Progress Note Reporting Period 02/03/2023 to 03/17/2023  See note below for Objective Data and Assessment of Progress/Goals.       END OF SESSION:  PT End of Session - 03/17/23 1259     Visit Number 6    Number of Visits 13    Date for PT Re-Evaluation 04/21/23    Authorization Type [employer] MCR    Authorization Time Period 02/03/23 to 04/20/2022    Authorization - Number of Visits 13    Progress Note Due on Visit 10    PT Start Time 1259    PT Stop Time 1339    PT Time Calculation (min) 40 min    Activity Tolerance Patient tolerated treatment well    Behavior During Therapy WFL for tasks assessed/performed               Past Medical History:  Diagnosis Date   Abdominal distension    Abdominal pain    Arthritis    Asthma    Biliary colic    COPD (chronic obstructive pulmonary disease) (HCC)    Diarrhea    GERD (gastroesophageal reflux disease)    Hypertension    Nausea    Past Surgical History:  Procedure Laterality Date   cartilage removal  2007   left knee   CESAREAN SECTION  1976   LAPAROSCOPY  1992 (approx)    abdominal   Patient Active Problem List   Diagnosis Date Noted   Former smoker 01/07/2022   Asthmatic bronchitis with exacerbation 11/21/2020   Atypical chest pain c/w IBS/ recurrent 09/27/2020   Healthcare maintenance 02/07/2020   Shoulder pain, left 01/14/2019   Pulmonary infiltrate 01/14/2019   Acute respiratory failure with hypoxia (HCC) 04/14/2018   Hemoptysis 04/11/2018   Influenza 04/11/2018   Cough 04/11/2018   Acute maxillary sinusitis 02/20/2018   Rhinitis, chronic 12/04/2017   Cough variant asthma  vs uacs with vcd 12/26/2016    PCP: Renaye Rakers MD   REFERRING PROVIDER: Nadara Mustard, MD  REFERRING DIAG: Diagnosis M54.50,G89.29 (ICD-10-CM) - Chronic  left-sided low back pain without sciatica M25.562,G89.29 (ICD-10-CM) - Chronic pain of left knee  Rationale for Evaluation and Treatment: Rehabilitation  THERAPY DIAG:  Other low back pain  Pain in left leg  Other symptoms and signs involving the musculoskeletal system  Muscle weakness (generalized)  ONSET DATE: chronic "I've been having problems with the left side for awhile"   SUBJECTIVE:  SUBJECTIVE STATEMENT: Pt indicated the back has been doing better.  Pt indicated working on the exercise.  Pt reported maybe 85% improvement back to normal.   PERTINENT HISTORY:  See above   PAIN:  NPRS scale: current 3/10.  At worst 4-5/10.   Pain location: low back, upper mid back Pain description: pulling  Aggravating factors: sometimes walking Relieving factors: Exercise and postural awareness    PRECAUTIONS: None  RED FLAGS: None   WEIGHT BEARING RESTRICTIONS: No  FALLS:  Has patient fallen in last 6 months? 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  DIAGNOSTIC FINDINGS:  02/03/2023 2 view radiographs of the lumbar spine shows degenerative disc disease  with a grade 1 spondylolisthesis at L4-5.  PATIENT SURVEYS:  03/17/2023 FOTO update:  58  02/03/2023 FOTO 48, predicted 52 in 13  visits   SCREENING FOR RED FLAGS: 02/03/2023 Bowel or bladder incontinence: No Spinal tumors: No Cauda equina syndrome: No Compression fracture: No Abdominal aneurysm: No  COGNITION: 02/03/2023 Overall cognitive status: Within functional limits for tasks assessed     SENSATION: 02/03/2023 Not tested  MUSCLE LENGTH: 02/03/2023 Hip flexors moderately tight B Quads moderate limitation B  Hamstrings moderate limitation B Piriformis mild limitation B   POSTURE: 02/03/2023 rounded shoulders, forward head, and increased thoracic kyphosis  PALPATION: 02/03/2023 Lumbar paraspinals tender L4-S1 level, proximal HS attachment   LUMBAR ROM:   AROM 02/03/2023 02/25/2023   Flexion WNL; RFIS no increase in pain     Extension Somewhat hypermobile  WFL 100%  Right lateral flexion Mild limitation     Left lateral flexion Mild limitation    Right rotation Mild limitaiton    Left rotation Mild limitation     (Blank rows = not tested)    LOWER EXTREMITY MMT:    MMT Right 02/03/2023 Left 02/03/2023 Right 03/17/2023 Left 03/17/2023  Hip flexion 4 4 5/5 5/5  Hip extension 3+ 3+    Hip abduction 3 3 3+/5 4/5  Hip adduction      Hip internal rotation      Hip external rotation      Knee flexion 4 4 5/5 5/5  Knee extension 4+ 4+ 5/5 5/5  Ankle dorsiflexion 4 4 5/5 5/5  Ankle plantarflexion      Ankle inversion      Ankle eversion       (Blank rows = not tested)    FUNCTIONAL TESTS:  02/03/2023 Timed up and go (TUG): 7.8 seconds no device                    TODAY'S TREATMENT:                                                                DATE: 03/17/2023 Therex: Nustep lvl 6 UE/LE 12  mins for ROM Standing hip hike with hands on bar 5 sec hold x 10 bilateral Standing green band rows 2 x 15 feet together Standing green band GH ext 2 x 15  feet together Supine bridge with blue band around knees hip abduction hold 5 sec x 15 reps Supine blue band clam shell with focus on isometric  hold contralateral leg x 20 each side  Verbal review of existing HEP and cues.  Discussed reduced frequency of visits to incorporate HEP reliance due to gains in presentation to this point.     TODAY'S TREATMENT:                                                                DATE: 03/05/2023 Modified Thomas stretch 2 x 20 seconds bilaterally Single knee to chest 1 x 20 seconds bilaterally Figure 4 stretch (push) 2 x 20 seconds bilaterally Supine hamstrings stretch 2 x 20 seconds bilaterally Bridging 10 x 5 seconds Trunk extension AROM 10 x 3 seconds Hip hike at counter top 2 sets of 10 x 3 seconds Heel to toe raises with transversus abdominis activation and foam block to encourage transversus abdominus activation 10 x 3 seconds  Functional Activities: Reviewed log roll, golfer's lift and the importance of avoiding prolonged postures, slouched postures, bending lifting and twisting   TODAY'S TREATMENT:                                                                DATE: 02/27/2023 Modified Thomas stretch 4 x 20 seconds bilaterally Figure 4 stretch (push) 4 x 20 seconds bilaterally Bridging 2 sets of 10 x 5 seconds Trunk extension AROM 10 x 3 seconds Hip hike at counter top 2 sets of 10 x 3 seconds Heel to toe raises with transversus abdominis activation 10 x 3 seconds  Functional Activities: Postural education including log roll, golfer's lift and education regarding avoiding prolonged postures, slouched postures, bending lifting and twisting    PATIENT EDUCATION:  Education details: exam findings, POC, HEP  Person educated: Patient Education method: Programmer, multimedia, Facilities manager, and Handouts Education comprehension: verbalized understanding, returned demonstration, and needs further education  HOME EXERCISE PROGRAM: Access Code: [email or web address] URL: [email or web address] Date: 03/05/2023 Prepared by: Pauletta Browns  Exercises - Supine Bridge  - 1 x daily - 7 x weekly - 3  sets - 10 reps - 5 seconds hold - Supine Figure 4 Piriformis Stretch  - 1 x daily - 7 x weekly - 3 sets - 10 reps - 10 seconds hold - Modified Thomas Stretch  - 1 x daily - 7 x weekly - 3 sets - 10 reps - 10 seconds hold - Seated Transversus Abdominus Bracing + March  - 1 x daily - 7 x weekly - 3 sets - 10 reps - Standing Lumbar Extension at Wall - Forearms  - 5 x daily - 7 x weekly - 1 sets - 5 reps - 3 seconds hold - Heel Toe Raises with Counter Support  - 2-3 x daily - 7 x weekly - 1 sets -  10 reps - 3 seconds hold - Standing Hip Hiking  - 1 x daily - 7 x weekly - 2-3 sets - 10 reps - 3 seconds hold - Single Knee to Chest Stretch  - 1 x daily - 7 x weekly - 1 sets - 4-5 reps - 20 seconds hold - Supine Hamstring Stretch  - 1 x daily - 7 x weekly - 1 sets - 4-5 reps - 20 seconds hold  ASSESSMENT:  CLINICAL IMPRESSION: The patient has attended 6 visits over the course of treatment cycle.  Patient has reported overall improvement at 85% with global rating of change at quite at bit better +5.  See objective data above for updated information regarding current presentation. Pt may continue to benefit from skilled PT services to continue to address remaining impairments and symptoms to continue progression towards all established goals. Reduced frequency indicated thought due to improvements to this point.     OBJECTIVE IMPAIRMENTS: Abnormal gait, decreased activity tolerance, decreased ROM, decreased strength, increased fascial restrictions, impaired flexibility, improper body mechanics, postural dysfunction, and pain.   ACTIVITY LIMITATIONS: carrying, lifting, bending, standing, stairs, transfers, bed mobility, locomotion level, and caring for others  PARTICIPATION LIMITATIONS: meal prep, cleaning, laundry, driving, shopping, community activity, and occupation  PERSONAL FACTORS: Age, Behavior pattern, Education, Fitness, Past/current experiences, Profession, Sex, Social background, and Time  since onset of injury/illness/exacerbation are also affecting patient's functional outcome.   REHAB POTENTIAL: Fair chronicity of impairments   CLINICAL DECISION MAKING: Stable/uncomplicated  EVALUATION COMPLEXITY: Low   GOALS: Goals reviewed with patient? No  SHORT TERM GOALS: Target date: 02/24/2023    Will be compliant with appropriate progressive HEP  Baseline: Goal status: Met  2.  Will demonstrate good biomechanics for bed mobility and floor to waist lifting  Baseline:  Goal status: Met  3.  Flexibility impairments to have improved by at least 50% Baseline:  Goal status: partially met    LONG TERM GOALS: Target date: 04/21/2023    MMT to improve by at least one grade in all weak groups  Baseline:  Goal status: revised 03/17/2023  2.  Pain to be no more than 2/10 at worst  Baseline:  Goal status: revised 03/17/2023  3.  Will have been able to return to outdoor exercise and fishing trips with no increase in pain from resting levels and no subjective unsteadiness over uneven surfaces to improve QOL  Baseline:  Goal status: revised 03/17/2023  4.  Will be able to stand statically for at least 30 minutes without increase in pain from resting levels to allow her to do functional tasks at home such as cooking  Baseline:  Goal status: revised 03/17/2023  5.  FOTO score to have met or exceeded predicted value by time of DC  Baseline:  Goal status: Met 03/17/2023    PLAN:  PT FREQUENCY: 1-2/x week if necessary  PT DURATION: 5 weeks  PLANNED INTERVENTIONS: 97164- PT Re-evaluation, 97110-Therapeutic exercises, 97530- Therapeutic activity, 97112- Neuromuscular re-education, 97535- Self Care, 84132- Manual therapy, [id number]- Gait training, [id number]- Aquatic Therapy, 97014- Electrical stimulation (unattended), Taping, Dry Needling, Cryotherapy, and Moist heat.  PLAN FOR NEXT SESSION: Recheck progress from last visit.    Chyrel Masson, PT, DPT, OCS, ATC 03/17/23  1:46  PM     Date of referral: 01/13/23 Referring provider: Nadara Mustard, MD Referring diagnosis? Diagnosis M54.50,G89.29 (ICD-10-CM) - Chronic left-sided low back pain without sciatica M25.562,G89.29 (ICD-10-CM) - Chronic pain of left knee Treatment diagnosis? (if different than  referring diagnosis) M54.59, M79.605, M62.81, R29.898  What was this (referring dx) caused by? Ongoing Issue  Ashby Dawes of Condition: Initial Onset (within last 3 months)   Laterality: Lt  Current Functional Measure Score: FOTO 48  Objective measurements identify impairments when they are compared to normal values, the uninvolved extremity, and prior level of function.  [x]  Yes  []  No  Objective assessment of functional ability: Moderate functional limitations   Briefly describe symptoms: pain in L knee coming from the back, tends to really hurt and become painful with static standing, OK with movement   How did symptoms start: insidiously   Average pain intensity:  Last 24 hours: 7/10  Past week: 8/10  How often does the pt experience symptoms? Frequently  How much have the symptoms interfered with usual daily activities? Quite a bit  How has condition changed since care began at this facility? A little worse  In general, how is the patients overall health? Good   BACK PAIN (STarT Back Screening Tool) Has pain spread down the leg(s) at some time in the last 2 weeks? Yes  Has there been pain in the shoulder or neck at some time in the last 2 weeks? Yes  Has the pt only walked short distances because of back pain? Yes  Does patient have worrying thoughts a lot of the time? Yes  Has patient stopped enjoying things they usually enjoy? Yes

## 2023-03-25 DIAGNOSIS — Z1212 Encounter for screening for malignant neoplasm of rectum: Secondary | ICD-10-CM | POA: Diagnosis not present

## 2023-03-25 DIAGNOSIS — Z1211 Encounter for screening for malignant neoplasm of colon: Secondary | ICD-10-CM | POA: Diagnosis not present

## 2023-04-01 ENCOUNTER — Ambulatory Visit: Payer: Medicare Other | Admitting: Rehabilitative and Restorative Service Providers"

## 2023-04-01 ENCOUNTER — Encounter: Payer: Self-pay | Admitting: Rehabilitative and Restorative Service Providers"

## 2023-04-01 DIAGNOSIS — R29898 Other symptoms and signs involving the musculoskeletal system: Secondary | ICD-10-CM

## 2023-04-01 DIAGNOSIS — M5459 Other low back pain: Secondary | ICD-10-CM

## 2023-04-01 DIAGNOSIS — M79605 Pain in left leg: Secondary | ICD-10-CM | POA: Diagnosis not present

## 2023-04-01 DIAGNOSIS — M6281 Muscle weakness (generalized): Secondary | ICD-10-CM

## 2023-04-01 NOTE — Therapy (Signed)
 OUTPATIENT PHYSICAL THERAPY TREATMENT   Patient Name: Sue Mitchell MRN: [id number] DOB:Apr 30, 1955, 68 y.o., female Today's Date: 04/01/2023   END OF SESSION:  PT End of Session - 04/01/23 1242     Visit Number 7    Number of Visits 13    Date for PT Re-Evaluation 04/21/23    Authorization Type [employer] MCR    Authorization Time Period 02/03/23 to 04/20/2022    Authorization - Number of Visits 13    Progress Note Due on Visit 16    PT Start Time 1258    PT Stop Time 1338    PT Time Calculation (min) 40 min    Activity Tolerance Patient tolerated treatment well    Behavior During Therapy [facility] for tasks assessed/performed                Past Medical History:  Diagnosis Date   Abdominal distension    Abdominal pain    Arthritis    Asthma    Biliary colic    COPD (chronic obstructive pulmonary disease) (HCC)    Diarrhea    GERD (gastroesophageal reflux disease)    Hypertension    Nausea    Past Surgical History:  Procedure Laterality Date   cartilage removal  2007   left knee   CESAREAN SECTION  1976   LAPAROSCOPY  1992 (approx)    abdominal   Patient Active Problem List   Diagnosis Date Noted   Former smoker 01/07/2022   Asthmatic bronchitis with exacerbation 11/21/2020   Atypical chest pain c/w IBS/ recurrent 09/27/2020   Healthcare maintenance 02/07/2020   Shoulder pain, left 01/14/2019   Pulmonary infiltrate 01/14/2019   Acute respiratory failure with hypoxia (HCC) 04/14/2018   Hemoptysis 04/11/2018   Influenza 04/11/2018   Cough 04/11/2018   Acute maxillary sinusitis 02/20/2018   Rhinitis, chronic 12/04/2017   Cough variant asthma  vs uacs with vcd 12/26/2016    PCP: Benjamine Aland MD   REFERRING PROVIDER: Harden Jerona GAILS, MD  REFERRING DIAG: Diagnosis M54.50,G89.29 (ICD-10-CM) - Chronic left-sided low back pain without sciatica M25.562,G89.29 (ICD-10-CM) - Chronic pain of left knee  Rationale for Evaluation and Treatment: Rehabilitation  THERAPY  DIAG:  Other low back pain  Pain in left leg  Other symptoms and signs involving the musculoskeletal system  Muscle weakness (generalized)  ONSET DATE: chronic I've been having problems with the left side for awhile   SUBJECTIVE:  SUBJECTIVE STATEMENT: Pt indicated having some complaints in back at times.  Pt indicated she would like a couple more visits to continue to help presentation.  Pt indicated unrelated neck symptoms from past returned some at times (was better from injection).  Pt indicated perform some but not all HEP recommendations.   PERTINENT HISTORY:  See above   PAIN:  NPRS scale: a little pain upon arrival  At worst 6/10   Pain location: low back, upper mid back Pain description: pulling  Aggravating factors: sometimes walking Relieving factors: Exercise and postural awareness    PRECAUTIONS: None  RED FLAGS: None   WEIGHT BEARING RESTRICTIONS: No  FALLS:  Has patient fallen in last 6 months? 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  DIAGNOSTIC FINDINGS:  02/03/2023 2 view radiographs of the lumbar spine shows degenerative disc disease  with a grade 1 spondylolisthesis at L4-5.  PATIENT SURVEYS:  03/17/2023 FOTO update:  58  02/03/2023 FOTO 48, predicted 52 in  13 visits   SCREENING FOR RED FLAGS: 02/03/2023 Bowel or bladder incontinence: No Spinal tumors: No Cauda equina syndrome: No Compression fracture: No Abdominal aneurysm: No  COGNITION: 02/03/2023 Overall cognitive status: Within functional limits for tasks assessed     SENSATION: 02/03/2023 Not tested  MUSCLE LENGTH: 02/03/2023 Hip flexors moderately tight B Quads moderate limitation B  Hamstrings moderate limitation B Piriformis mild limitation B   POSTURE: 02/03/2023 rounded shoulders, forward head, and increased thoracic kyphosis  PALPATION: 02/03/2023 Lumbar paraspinals tender L4-S1 level, proximal HS attachment   LUMBAR ROM:   AROM 02/03/2023 02/25/2023  04/01/2023  Flexion WNL; RFIS no increase in pain      Extension Somewhat hypermobile  WFL 100% WFL 100 %  Right lateral flexion Mild limitation      Left lateral flexion Mild limitation     Right rotation Mild limitaiton     Left rotation Mild limitation      (Blank rows = not tested)    LOWER EXTREMITY MMT:    MMT Right 02/03/2023 Left 02/03/2023 Right 03/17/2023 Left 03/17/2023 Right 04/01/2023 Left 04/01/2023  Hip flexion 4 4 5/5 5/5 5/5 5/5  Hip extension 3+ 3+      Hip abduction 3 3 3+/5 4/5    Hip adduction        Hip internal rotation        Hip external rotation        Knee flexion 4 4 5/5 5/5 5/5 5/5  Knee extension 4+ 4+ 5/5 5/5 5/5 5/5  Ankle dorsiflexion 4 4 5/5 5/5    Ankle plantarflexion        Ankle inversion        Ankle eversion         (Blank rows = not tested)    FUNCTIONAL TESTS:  02/03/2023 Timed up and go (TUG): 7.8 seconds no device                    TODAY'S TREATMENT:  DATE: 04/01/2023 Therex: Nustep lvl 6 UE/LE 12 mins for ROM Standing lumbar extension AROM x 5 Supine lumbar trunk rotation 15 sec x 3 bliateral Supine bridge c blue band around knees 2 x 10 2-3 sec hold in lift Supine hooklying hip clam shell blue  band x 20 bilateral   Education review of existing HEP.   Neuro Re-ed (for muscle recruitment for postural support) Tband blue rows 2 x 15 Tband green band gh ext 2 x 15 Seated scapular retraction 2 sec hold x5   TODAY'S TREATMENT:                                                                DATE: 03/17/2023 Therex: Nustep lvl 6 UE/LE 12 mins for ROM Standing hip hike with hands on bar 5 sec hold x 10 bilateral Standing green band rows 2 x 15 feet together Standing green band GH ext 2 x 15  feet together Supine bridge with blue band around knees hip abduction hold 5 sec x 15 reps Supine blue band clam shell with focus on isometric hold contralateral leg x 20 each side  Verbal review of existing HEP and cues.  Discussed reduced frequency of visits to incorporate HEP reliance due to gains in presentation to this point.     TODAY'S TREATMENT:                                                                DATE: 03/05/2023 Modified Thomas stretch 2 x 20 seconds bilaterally Single knee to chest 1 x 20 seconds bilaterally Figure 4 stretch (push) 2 x 20 seconds bilaterally Supine hamstrings stretch 2 x 20 seconds bilaterally Bridging 10 x 5 seconds Trunk extension AROM 10 x 3 seconds Hip hike at counter top 2 sets of 10 x 3 seconds Heel to toe raises with transversus abdominis activation and foam block to encourage transversus abdominus activation 10 x 3 seconds  Functional Activities: Reviewed log roll, golfer's lift and the importance of avoiding prolonged postures, slouched postures, bending lifting and twisting   PATIENT EDUCATION:  Education details: exam findings, POC, HEP  Person educated: Patient Education method: Programmer, Multimedia, Facilities Manager, and Handouts Education comprehension: verbalized understanding, returned demonstration, and needs further education  HOME EXERCISE PROGRAM: Access Code: [email or web address] URL: [email or web address] Date: 03/05/2023 Prepared by:  Lamar Ivory  Exercises - Supine Bridge  - 1 x daily - 7 x weekly - 3 sets - 10 reps - 5 seconds hold - Supine Figure 4 Piriformis Stretch  - 1 x daily - 7 x weekly - 3 sets - 10 reps - 10 seconds hold - Modified Thomas Stretch  - 1 x daily - 7 x weekly - 3 sets - 10 reps - 10 seconds hold - Seated Transversus Abdominus Bracing + March  - 1 x daily - 7 x weekly - 3 sets - 10 reps - Standing Lumbar Extension at Wall - Forearms  - 5 x daily - 7 x weekly - 1 sets - 5 reps - 3 seconds hold - Heel Toe Raises  with Counter Support  - 2-3 x daily - 7 x weekly - 1 sets - 10 reps - 3 seconds hold - Standing Hip Hiking  - 1 x daily - 7 x weekly - 2-3 sets - 10 reps - 3 seconds hold - Single Knee to Chest Stretch  - 1 x daily - 7 x weekly - 1 sets - 4-5 reps - 20 seconds hold - Supine Hamstring Stretch  - 1 x daily - 7 x weekly - 1 sets - 4-5 reps - 20 seconds hold  ASSESSMENT:  CLINICAL IMPRESSION: Pt returned today after several weeks with continued mild to moderate symptoms of lumbar noted. Pt may continue to benefit from skilled PT services to continue to address symptoms, hip strength and improve HEP usage and knowledge for self management of symptoms upon discharge when appropriate.   OBJECTIVE IMPAIRMENTS: Abnormal gait, decreased activity tolerance, decreased ROM, decreased strength, increased fascial restrictions, impaired flexibility, improper body mechanics, postural dysfunction, and pain.   ACTIVITY LIMITATIONS: carrying, lifting, bending, standing, stairs, transfers, bed mobility, locomotion level, and caring for others  PARTICIPATION LIMITATIONS: meal prep, cleaning, laundry, driving, shopping, community activity, and occupation  PERSONAL FACTORS: Age, Behavior pattern, Education, Fitness, Past/current experiences, Profession, Sex, Social background, and Time since onset of injury/illness/exacerbation are also affecting patient's functional outcome.   REHAB POTENTIAL: Fair chronicity of  impairments   CLINICAL DECISION MAKING: Stable/uncomplicated  EVALUATION COMPLEXITY: Low   GOALS: Goals reviewed with patient? No  SHORT TERM GOALS: Target date: 02/24/2023    Will be compliant with appropriate progressive HEP  Baseline: Goal status: Met  2.  Will demonstrate good biomechanics for bed mobility and floor to waist lifting  Baseline:  Goal status: Met  3.  Flexibility impairments to have improved by at least 50% Baseline:  Goal status: partially met    LONG TERM GOALS: Target date: 04/21/2023    MMT to improve by at least one grade in all weak groups  Baseline:  Goal status: on going 04/01/2023  2.  Pain to be no more than 2/10 at worst  Baseline:  Goal status: on going 04/01/2023  3.  Will have been able to return to outdoor exercise and fishing trips with no increase in pain from resting levels and no subjective unsteadiness over uneven surfaces to improve QOL  Baseline:  Goal status: on going 04/01/2023  4.  Will be able to stand statically for at least 30 minutes without increase in pain from resting levels to allow her to do functional tasks at home such as cooking  Baseline:  Goal status:on going 04/01/2023  5.  FOTO score to have met or exceeded predicted value by time of DC  Baseline:  Goal status: Met 03/17/2023    PLAN:  PT FREQUENCY: 1-2/x week if necessary  PT DURATION: 5 weeks  PLANNED INTERVENTIONS: 97164- PT Re-evaluation, 97110-Therapeutic exercises, 97530- Therapeutic activity, 97112- Neuromuscular re-education, 97535- Self Care, 02859- Manual therapy, [id number]- Gait training, [id number]- Aquatic Therapy, 97014- Electrical stimulation (unattended), Taping, Dry Needling, Cryotherapy, and Moist heat.  PLAN FOR NEXT SESSION: Continue postural strengthening, hip strengthening.    Ozell Silvan, PT, DPT, OCS, ATC 04/01/23  1:34 PM     Date of referral: 01/13/23 Referring provider: Harden Jerona GAILS, MD Referring diagnosis? Diagnosis  M54.50,G89.29 (ICD-10-CM) - Chronic left-sided low back pain without sciatica M25.562,G89.29 (ICD-10-CM) - Chronic pain of left knee Treatment diagnosis? (if different than referring diagnosis) M54.59, M79.605, M62.81, R29.898  What was this (referring dx) caused  by? Ongoing Issue  Nature of Condition: Initial Onset (within last 3 months)   Laterality: Lt  Current Functional Measure Score: FOTO 48  Objective measurements identify impairments when they are compared to normal values, the uninvolved extremity, and prior level of function.  [x]  Yes  []  No  Objective assessment of functional ability: Moderate functional limitations   Briefly describe symptoms: pain in L knee coming from the back, tends to really hurt and become painful with static standing, OK with movement   How did symptoms start: insidiously   Average pain intensity:  Last 24 hours: 7/10  Past week: 8/10  How often does the pt experience symptoms? Frequently  How much have the symptoms interfered with usual daily activities? Quite a bit  How has condition changed since care began at this facility? A little worse  In general, how is the patients overall health? Good   BACK PAIN (STarT Back Screening Tool) Has pain spread down the leg(s) at some time in the last 2 weeks? Yes  Has there been pain in the shoulder or neck at some time in the last 2 weeks? Yes  Has the pt only walked short distances because of back pain? Yes  Does patient have worrying thoughts a lot of the time? Yes  Has patient stopped enjoying things they usually enjoy? Yes

## 2023-04-02 ENCOUNTER — Telehealth: Payer: Self-pay | Admitting: Acute Care

## 2023-04-02 ENCOUNTER — Other Ambulatory Visit: Payer: Self-pay

## 2023-04-02 DIAGNOSIS — Z87891 Personal history of nicotine dependence: Secondary | ICD-10-CM

## 2023-04-02 DIAGNOSIS — Z122 Encounter for screening for malignant neoplasm of respiratory organs: Secondary | ICD-10-CM

## 2023-04-02 NOTE — Telephone Encounter (Signed)
 Lung Cancer Screening Narrative/Criteria Questionnaire (Cigarette Smokers Only- No Cigars/Pipes/vapes)   Sue Mitchell   SDMV:04/16/23 at 0930am with KATY                                           01/27/1956              LDCT: 04/21/23 at 0940am at [facility] [address]    68 y.o.  Phone: [phone]  Lung Screening Narrative (confirm age 22-77 yrs Medicare / 50-80 yrs Private pay insurance)   Insurance information:[employer]   Referring Provider:Wert   This screening involves an initial phone call with a team member from our program. It is called a shared decision making visit. The initial meeting is required by insurance and Medicare to make sure you understand the program. This appointment takes about 15-20 minutes to complete. The CT scan will completed at a separate date/time. This scan takes about 5-10 minutes to complete and you may eat and drink before and after the scan.  Criteria questions for Lung Cancer Screening:   Are you a current or former smoker? Former Age began smoking: 68 yo   If you are a former smoker, what year did you quit smoking? 2011 (quit 1 additional year)   To calculate your smoking history, I need an accurate estimate of how many packs of cigarettes you smoked per day and for how many years. (Not just the number of PPD you are now smoking)   Years smoking 37 x Packs per day 1/2 to 1 = Pack years 27   (at least 20 pack yrs)   (Make sure they understand that we need to know how much they have smoked in the past, not just the number of PPD they are smoking now)  Do you have a personal history of cancer?  No    Do you have a family history of cancer? No  Are you coughing up blood?  No  Have you had unexplained weight loss of 15 lbs or more in the last 6 months? No  It looks like you meet all criteria.     Additional information: N/A

## 2023-04-11 ENCOUNTER — Encounter: Payer: Self-pay | Admitting: Rehabilitative and Restorative Service Providers"

## 2023-04-11 ENCOUNTER — Ambulatory Visit: Payer: Medicare Other | Admitting: Rehabilitative and Restorative Service Providers"

## 2023-04-11 DIAGNOSIS — R29898 Other symptoms and signs involving the musculoskeletal system: Secondary | ICD-10-CM | POA: Diagnosis not present

## 2023-04-11 DIAGNOSIS — M5459 Other low back pain: Secondary | ICD-10-CM

## 2023-04-11 DIAGNOSIS — M79605 Pain in left leg: Secondary | ICD-10-CM

## 2023-04-11 DIAGNOSIS — M6281 Muscle weakness (generalized): Secondary | ICD-10-CM | POA: Diagnosis not present

## 2023-04-11 NOTE — Therapy (Addendum)
 OUTPATIENT PHYSICAL THERAPY TREATMENT  / DISCHARGE   Patient Name: Sue Mitchell MRN: [id number] DOB:[birth year], 68 y.o., female Today's Date: 04/11/2023   END OF SESSION:  PT End of Session - 04/11/23 0856     Visit Number 8    Number of Visits 13    Date for PT Re-Evaluation 04/21/23    Authorization Type [employer] MCR    Authorization Time Period 02/03/23 to 04/20/2022    Authorization - Number of Visits 13    Progress Note Due on Visit 16    PT Start Time 0854    PT Stop Time 0933    PT Time Calculation (min) 39 min    Activity Tolerance Patient tolerated treatment well;No increased pain    Behavior During Therapy WFL for tasks assessed/performed                 Past Medical History:  Diagnosis Date   Abdominal distension    Abdominal pain    Arthritis    Asthma    Biliary colic    COPD (chronic obstructive pulmonary disease) (HCC)    Diarrhea    GERD (gastroesophageal reflux disease)    Hypertension    Nausea    Past Surgical History:  Procedure Laterality Date   cartilage removal  2007   left knee   CESAREAN SECTION  1976   LAPAROSCOPY  1992 (approx)    abdominal   Patient Active Problem List   Diagnosis Date Noted   Former smoker 01/07/2022   Asthmatic bronchitis with exacerbation 11/21/2020   Atypical chest pain c/w IBS/ recurrent 09/27/2020   Healthcare maintenance 02/07/2020   Shoulder pain, left 01/14/2019   Pulmonary infiltrate 01/14/2019   Acute respiratory failure with hypoxia (HCC) 04/14/2018   Hemoptysis 04/11/2018   Influenza 04/11/2018   Cough 04/11/2018   Acute maxillary sinusitis 02/20/2018   Rhinitis, chronic 12/04/2017   Cough variant asthma  vs uacs with vcd 12/26/2016    PCP: Jonathon Neighbors MD   REFERRING PROVIDER: Timothy Ford, MD  REFERRING DIAG: Diagnosis M54.50,G89.29 (ICD-10-CM) - Chronic left-sided low back pain without sciatica M25.562,G89.29 (ICD-10-CM) - Chronic pain of left knee  Rationale for Evaluation and  Treatment: Rehabilitation  THERAPY DIAG:  Other low back pain  Pain in left leg  Muscle weakness (generalized)  Other symptoms and signs involving the musculoskeletal system  ONSET DATE: chronic "I've been having problems with the left side for awhile"   SUBJECTIVE:  SUBJECTIVE STATEMENT: Feels good since last session.   PERTINENT HISTORY:  See above   PAIN:  NPRS scale: a little pain upon arrival  At worst 6/10   Pain location: low back, upper mid back Pain description: pulling  Aggravating factors: sometimes walking Relieving factors: Exercise and postural awareness    PRECAUTIONS: None  RED FLAGS: None   WEIGHT BEARING RESTRICTIONS: No  FALLS:  Has patient fallen in last 6 months? 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  DIAGNOSTIC FINDINGS:  02/03/2023 2 view radiographs of the lumbar spine shows degenerative disc disease  with a grade 1 spondylolisthesis at L4-5.  PATIENT SURVEYS:  03/17/2023 FOTO update:  58  02/03/2023 FOTO 48, predicted 52 in 13 visits   SCREENING FOR RED FLAGS: 02/03/2023 Bowel or bladder incontinence: No Spinal tumors: No Cauda equina syndrome: No Compression fracture: No Abdominal aneurysm: No  COGNITION: 02/03/2023 Overall cognitive  status: Within functional limits for tasks assessed     SENSATION: 02/03/2023 Not tested  MUSCLE LENGTH: 02/03/2023 Hip flexors moderately tight B Quads moderate limitation B  Hamstrings moderate limitation B Piriformis mild limitation B   POSTURE: 02/03/2023 rounded shoulders, forward head, and increased thoracic kyphosis  PALPATION: 02/03/2023 Lumbar paraspinals tender L4-S1 level, proximal HS attachment   LUMBAR ROM:   AROM 02/03/2023 02/25/2023  04/01/2023 04/11/2023  Flexion WNL; RFIS no increase in pain       Extension Somewhat hypermobile  WFL 100% WFL 100 % 100 % after several reps  Right lateral flexion Mild limitation       Left lateral flexion Mild limitation      Right rotation Mild limitaiton      Left rotation Mild limitation       (Blank rows = not tested)    LOWER EXTREMITY MMT:    MMT Right 02/03/2023 Left 02/03/2023 Right 03/17/2023 Left 03/17/2023 Right 04/01/2023 Left 04/01/2023  Hip flexion 4 4 5/5 5/5 5/5 5/5  Hip extension 3+ 3+      Hip abduction 3 3 3+/5 4/5    Hip adduction        Hip internal rotation        Hip external rotation        Knee flexion 4 4 5/5 5/5 5/5 5/5  Knee extension 4+ 4+ 5/5 5/5 5/5 5/5  Ankle dorsiflexion 4 4 5/5 5/5    Ankle plantarflexion        Ankle inversion        Ankle eversion         (Blank rows = not tested)    FUNCTIONAL TESTS:  02/03/2023 Timed up and go (TUG): 7.8 seconds no device                   TODAY'S TREATMENT:  DATE: 04/11/2023 Therex: Standing lumbar extension AROM x 5 Supine lumbar trunk rotation 15 sec x 3 bliateral Supine bridge c blue band around knees 2 x 10 2-3 sec hold in lift Supine hooklying hip clam shell blue band x 20 bilateral   Neuro Re-ed (for muscle recruitment for postural support) Blue tband rows 2 x 15, tc and demo for proper form Green tband ext 2 x 15; tc for proper form Hip hike //bar with 4in step 2x8 ea side;  visual aide, demo, ad tc needed for proper performance of activity   TODAY'S TREATMENT:                                                                DATE: 04/01/2023 Therex: Nustep lvl 6 UE/LE 12 mins for ROM Standing lumbar extension AROM x 5 Supine lumbar trunk rotation 15 sec x 3 bliateral Supine bridge c blue band around knees 2 x 10 2-3 sec hold in lift Supine hooklying hip clam shell blue band x 20 bilateral   Education review of existing HEP.   Neuro Re-ed (for muscle recruitment for postural support) Tband blue rows 2 x 15 Tband green band gh ext 2 x 15 Seated scapular retraction 2 sec hold x5   TODAY'S TREATMENT:                                                                DATE: 03/17/2023 Therex: Nustep lvl 6 UE/LE 12 mins for ROM Standing hip hike with hands on bar 5 sec hold x 10 bilateral Standing green band rows 2 x 15 feet together Standing green band GH ext 2 x 15  feet together Supine bridge with blue band around knees hip abduction hold 5 sec x 15 reps Supine blue band clam shell with focus on isometric hold contralateral leg x 20 each side  Verbal review of existing HEP and cues.  Discussed reduced frequency of visits to incorporate HEP reliance due to gains in presentation to this point.     TODAY'S TREATMENT:                                                                DATE: 03/05/2023 Modified Thomas stretch 2 x 20 seconds bilaterally Single knee to chest 1 x 20 seconds bilaterally Figure 4 stretch (push) 2 x 20 seconds bilaterally Supine hamstrings stretch 2 x 20 seconds bilaterally Bridging 10 x 5 seconds Trunk extension AROM 10 x 3 seconds Hip hike at counter top 2 sets of 10 x 3 seconds Heel to toe raises with transversus abdominis activation and foam block to encourage transversus abdominus activation 10 x 3 seconds  Functional Activities: Reviewed log roll, golfer's lift and the importance of avoiding prolonged postures, slouched postures,  bending lifting and twisting   PATIENT EDUCATION:  Education details: exam findings,  POC, HEP  Person educated: Patient Education method: Explanation, Demonstration, and Handouts Education comprehension: verbalized understanding, returned demonstration, and needs further education  HOME EXERCISE PROGRAM: Access Code: [email or web address] URL: [email or web address] Date: 03/05/2023 Prepared by: Terral Ferrari  Exercises - Supine Bridge  - 1 x daily - 7 x weekly - 3 sets - 10 reps - 5 seconds hold - Supine Figure 4 Piriformis Stretch  - 1 x daily - 7 x weekly - 3 sets - 10 reps - 10 seconds hold - Modified Thomas Stretch  - 1 x daily - 7 x weekly - 3 sets - 10 reps - 10 seconds hold - Seated Transversus Abdominus Bracing + March  - 1 x daily - 7 x weekly - 3 sets - 10 reps - Standing Lumbar Extension at Wall - Forearms  - 5 x daily - 7 x weekly - 1 sets - 5 reps - 3 seconds hold - Heel Toe Raises with Counter Support  - 2-3 x daily - 7 x weekly - 1 sets - 10 reps - 3 seconds hold - Standing Hip Hiking  - 1 x daily - 7 x weekly - 2-3 sets - 10 reps - 3 seconds hold - Single Knee to Chest Stretch  - 1 x daily - 7 x weekly - 1 sets - 4-5 reps - 20 seconds hold - Supine Hamstring Stretch  - 1 x daily - 7 x weekly - 1 sets - 4-5 reps - 20 seconds hold  ASSESSMENT:  CLINICAL IMPRESSION: Patient did well with activity today with no increase of pain. Continues to progress towards discharge. Does best with activities when demonstration is performed before attempts.   OBJECTIVE IMPAIRMENTS: Abnormal gait, decreased activity tolerance, decreased ROM, decreased strength, increased fascial restrictions, impaired flexibility, improper body mechanics, postural dysfunction, and pain.   ACTIVITY LIMITATIONS: carrying, lifting, bending, standing, stairs, transfers, bed mobility, locomotion level, and caring for others  PARTICIPATION LIMITATIONS: meal prep, cleaning, laundry, driving, shopping,  community activity, and occupation  PERSONAL FACTORS: Age, Behavior pattern, Education, Fitness, Past/current experiences, Profession, Sex, Social background, and Time since onset of injury/illness/exacerbation are also affecting patient's functional outcome.   REHAB POTENTIAL: Fair chronicity of impairments   CLINICAL DECISION MAKING: Stable/uncomplicated  EVALUATION COMPLEXITY: Low   GOALS: Goals reviewed with patient? No  SHORT TERM GOALS: Target date: 02/24/2023    Will be compliant with appropriate progressive HEP  Baseline: Goal status: Met  2.  Will demonstrate good biomechanics for bed mobility and floor to waist lifting  Baseline:  Goal status: Met  3.  Flexibility impairments to have improved by at least 50% Baseline:  Goal status: partially met    LONG TERM GOALS: Target date: 04/21/2023    MMT to improve by at least one grade in all weak groups  Baseline:  Goal status: on going 04/01/2023  2.  Pain to be no more than 2/10 at worst  Baseline:  Goal status: on going 04/01/2023  3.  Will have been able to return to outdoor exercise and fishing trips with no increase in pain from resting levels and no subjective unsteadiness over uneven surfaces to improve QOL  Baseline:  Goal status: on going 04/01/2023  4.  Will be able to stand statically for at least 30 minutes without increase in pain from resting levels to allow her to do functional tasks at home such as cooking  Baseline:  Goal status:on going 04/01/2023  5.  FOTO score to have met or exceeded predicted  value by time of DC  Baseline:  Goal status: Met 03/17/2023    PLAN:  PT FREQUENCY: 1-2/x week if necessary  PT DURATION: 5 weeks  PLANNED INTERVENTIONS: 97164- PT Re-evaluation, 97110-Therapeutic exercises, 97530- Therapeutic activity, 97112- Neuromuscular re-education, 97535- Self Care, 78295- Manual therapy, [id number]- Gait training, [id number]- Aquatic Therapy, 97014- Electrical stimulation (unattended),  Taping, Dry Needling, Cryotherapy, and Moist heat.  PLAN FOR NEXT SESSION: expect discharge, finalize HEP for discharge, address residual goals/functional outcomes    Sueko Dimichele, Lundon Rosier, Student-PT 04/11/2023, 9:48 AM   Date of referral: 01/13/23 Referring provider: Timothy Ford, MD Referring diagnosis? Diagnosis M54.50,G89.29 (ICD-10-CM) - Chronic left-sided low back pain without sciatica M25.562,G89.29 (ICD-10-CM) - Chronic pain of left knee Treatment diagnosis? (if different than referring diagnosis) M54.59, M79.605, M62.81, R29.898  What was this (referring dx) caused by? Ongoing Issue  Lonne Roan of Condition: Initial Onset (within last 3 months)   Laterality: Lt  Current Functional Measure Score: FOTO 48  Objective measurements identify impairments when they are compared to normal values, the uninvolved extremity, and prior level of function.  [x]  Yes  []  No  Objective assessment of functional ability: Moderate functional limitations   Briefly describe symptoms: pain in L knee coming from the back, tends to really hurt and become painful with static standing, OK with movement   How did symptoms start: insidiously   Average pain intensity:  Last 24 hours: 7/10  Past week: 8/10  How often does the pt experience symptoms? Frequently  How much have the symptoms interfered with usual daily activities? Quite a bit  How has condition changed since care began at this facility? A little worse  In general, how is the patients overall health? Good   BACK PAIN (STarT Back Screening Tool) Has pain spread down the leg(s) at some time in the last 2 weeks? Yes  Has there been pain in the shoulder or neck at some time in the last 2 weeks? Yes  Has the pt only walked short distances because of back pain? Yes  Does patient have worrying thoughts a lot of the time? Yes  Has patient stopped enjoying things they usually enjoy? Yes     PHYSICAL THERAPY DISCHARGE SUMMARY  Visits  from Start of Care: 8  Current functional level related to goals / functional outcomes: See note   Remaining deficits: See note   Education / Equipment: HEP  Patient goals were partially met. Patient is being discharged due to not returning since the last visit.  Bonna Bustard, PT, DPT, OCS, ATC 06/11/23  8:25 AM

## 2023-04-12 ENCOUNTER — Other Ambulatory Visit: Payer: Self-pay | Admitting: Allergy and Immunology

## 2023-04-15 ENCOUNTER — Encounter: Payer: Medicare Other | Admitting: Rehabilitative and Restorative Service Providers"

## 2023-04-15 NOTE — Therapy (Incomplete)
OUTPATIENT PHYSICAL THERAPY TREATMENT   Patient Name: Sue Mitchell MRN: [id number] DOB:08-23-1955, 68 y.o., female Today's Date: 04/15/2023   END OF SESSION:        Past Medical History:  Diagnosis Date   Abdominal distension    Abdominal pain    Arthritis    Asthma    Biliary colic    COPD (chronic obstructive pulmonary disease) (HCC)    Diarrhea    GERD (gastroesophageal reflux disease)    Hypertension    Nausea    Past Surgical History:  Procedure Laterality Date   cartilage removal  2007   left knee   CESAREAN SECTION  1976   LAPAROSCOPY  1992 (approx)    abdominal   Patient Active Problem List   Diagnosis Date Noted   Former smoker 01/07/2022   Asthmatic bronchitis with exacerbation 11/21/2020   Atypical chest pain c/w IBS/ recurrent 09/27/2020   Healthcare maintenance 02/07/2020   Shoulder pain, left 01/14/2019   Pulmonary infiltrate 01/14/2019   Acute respiratory failure with hypoxia (HCC) 04/14/2018   Hemoptysis 04/11/2018   Influenza 04/11/2018   Cough 04/11/2018   Acute maxillary sinusitis 02/20/2018   Rhinitis, chronic 12/04/2017   Cough variant asthma  vs uacs with vcd 12/26/2016    PCP: Renaye Rakers MD   REFERRING PROVIDER: Nadara Mustard, MD  REFERRING DIAG: Diagnosis M54.50,G89.29 (ICD-10-CM) - Chronic left-sided low back pain without sciatica M25.562,G89.29 (ICD-10-CM) - Chronic pain of left knee  Rationale for Evaluation and Treatment: Rehabilitation  THERAPY DIAG:  No diagnosis found.  ONSET DATE: chronic "I've been having problems with the left side for awhile"   SUBJECTIVE:                                                                                                                                                                                           SUBJECTIVE STATEMENT: Feels good since last session.   PERTINENT HISTORY:  See above   PAIN:  NPRS scale: a little pain upon arrival  At worst 6/10   Pain location:  low back, upper mid back Pain description: pulling  Aggravating factors: sometimes walking Relieving factors: Exercise and postural awareness    PRECAUTIONS: None  RED FLAGS: None   WEIGHT BEARING RESTRICTIONS: No  FALLS:  Has patient fallen in last 6 months? 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  DIAGNOSTIC FINDINGS:  02/03/2023 2 view radiographs of the lumbar spine shows degenerative disc disease  with a grade 1 spondylolisthesis at L4-5.  PATIENT SURVEYS:  03/17/2023 FOTO update:  58  02/03/2023 FOTO 48, predicted 52 in 13 visits   SCREENING FOR RED FLAGS: 02/03/2023 Bowel or bladder incontinence: No Spinal tumors: No Cauda equina syndrome: No Compression fracture: No Abdominal aneurysm: No  COGNITION: 02/03/2023 Overall cognitive status: Within functional limits for tasks assessed     SENSATION: 02/03/2023 Not tested  MUSCLE LENGTH: 02/03/2023 Hip flexors moderately tight B Quads moderate limitation B  Hamstrings moderate limitation B Piriformis mild limitation B   POSTURE: 02/03/2023 rounded shoulders, forward head, and increased thoracic kyphosis  PALPATION: 02/03/2023 Lumbar paraspinals tender L4-S1 level, proximal HS attachment   LUMBAR ROM:   AROM 02/03/2023 02/25/2023  04/01/2023 04/11/2023  Flexion WNL; RFIS no increase in pain       Extension Somewhat hypermobile  WFL 100% WFL 100 % 100 % after several reps  Right lateral flexion Mild  limitation       Left lateral flexion Mild limitation      Right rotation Mild limitaiton      Left rotation Mild limitation       (Blank rows = not tested)    LOWER EXTREMITY MMT:    MMT Right 02/03/2023 Left 02/03/2023 Right 03/17/2023 Left 03/17/2023 Right 04/01/2023 Left 04/01/2023  Hip flexion 4 4 5/5 5/5 5/5 5/5  Hip extension 3+ 3+      Hip abduction 3 3 3+/5 4/5    Hip adduction        Hip internal rotation        Hip external rotation        Knee flexion 4 4 5/5 5/5 5/5 5/5  Knee extension 4+ 4+ 5/5 5/5 5/5 5/5  Ankle dorsiflexion 4 4 5/5 5/5    Ankle plantarflexion        Ankle inversion        Ankle eversion         (Blank rows = not tested)    FUNCTIONAL TESTS:  02/03/2023 Timed up and go (TUG): 7.8 seconds no device                   TODAY'S TREATMENT:                                                                DATE: 04/15/2023 Therex: Standing lumbar extension AROM x 5 Supine lumbar trunk rotation 15 sec x 3 bliateral Supine bridge c blue band around knees 2 x 10 2-3 sec hold in lift Supine hooklying hip clam shell blue band x 20 bilateral   Neuro Re-ed (for muscle recruitment for postural support) Blue tband rows 2 x 15, tc and demo for proper form Green tband ext 2 x 15; tc for proper form Hip hike //bar with 4in step 2x8 ea side; visual aide, demo, ad tc needed for proper performance of activity  TODAY'S TREATMENT:  DATE: 04/11/2023 Therex: Standing lumbar extension AROM x 5 Supine lumbar trunk rotation 15 sec x 3 bliateral Supine bridge c blue band around knees 2 x 10 2-3 sec hold in lift Supine hooklying hip clam shell blue band x 20 bilateral   Neuro Re-ed (for muscle recruitment for postural support) Blue tband rows 2 x 15, tc and demo for proper form Green tband ext 2 x 15; tc for proper form Hip hike //bar with 4in step 2x8 ea side; visual aide, demo, ad tc needed for proper  performance of activity   TODAY'S TREATMENT:                                                                DATE: 04/01/2023 Therex: Nustep lvl 6 UE/LE 12 mins for ROM Standing lumbar extension AROM x 5 Supine lumbar trunk rotation 15 sec x 3 bliateral Supine bridge c blue band around knees 2 x 10 2-3 sec hold in lift Supine hooklying hip clam shell blue band x 20 bilateral   Education review of existing HEP.   Neuro Re-ed (for muscle recruitment for postural support) Tband blue rows 2 x 15 Tband green band gh ext 2 x 15 Seated scapular retraction 2 sec hold x5   TODAY'S TREATMENT:                                                                DATE: 03/17/2023 Therex: Nustep lvl 6 UE/LE 12 mins for ROM Standing hip hike with hands on bar 5 sec hold x 10 bilateral Standing green band rows 2 x 15 feet together Standing green band GH ext 2 x 15  feet together Supine bridge with blue band around knees hip abduction hold 5 sec x 15 reps Supine blue band clam shell with focus on isometric hold contralateral leg x 20 each side  Verbal review of existing HEP and cues.  Discussed reduced frequency of visits to incorporate HEP reliance due to gains in presentation to this point.     TODAY'S TREATMENT:                                                                DATE: 03/05/2023 Modified Thomas stretch 2 x 20 seconds bilaterally Single knee to chest 1 x 20 seconds bilaterally Figure 4 stretch (push) 2 x 20 seconds bilaterally Supine hamstrings stretch 2 x 20 seconds bilaterally Bridging 10 x 5 seconds Trunk extension AROM 10 x 3 seconds Hip hike at counter top 2 sets of 10 x 3 seconds Heel to toe raises with transversus abdominis activation and foam block to encourage transversus abdominus activation 10 x 3 seconds  Functional Activities: Reviewed log roll, golfer's lift and the importance of avoiding prolonged postures, slouched postures, bending lifting and twisting   PATIENT  EDUCATION:  Education details: exam findings,  POC, HEP  Person educated: Patient Education method: Explanation, Demonstration, and Handouts Education comprehension: verbalized understanding, returned demonstration, and needs further education  HOME EXERCISE PROGRAM: Access Code: [email or web address] URL: [email or web address] Date: 03/05/2023 Prepared by: Pauletta Browns  Exercises - Supine Bridge  - 1 x daily - 7 x weekly - 3 sets - 10 reps - 5 seconds hold - Supine Figure 4 Piriformis Stretch  - 1 x daily - 7 x weekly - 3 sets - 10 reps - 10 seconds hold - Modified Thomas Stretch  - 1 x daily - 7 x weekly - 3 sets - 10 reps - 10 seconds hold - Seated Transversus Abdominus Bracing + March  - 1 x daily - 7 x weekly - 3 sets - 10 reps - Standing Lumbar Extension at Wall - Forearms  - 5 x daily - 7 x weekly - 1 sets - 5 reps - 3 seconds hold - Heel Toe Raises with Counter Support  - 2-3 x daily - 7 x weekly - 1 sets - 10 reps - 3 seconds hold - Standing Hip Hiking  - 1 x daily - 7 x weekly - 2-3 sets - 10 reps - 3 seconds hold - Single Knee to Chest Stretch  - 1 x daily - 7 x weekly - 1 sets - 4-5 reps - 20 seconds hold - Supine Hamstring Stretch  - 1 x daily - 7 x weekly - 1 sets - 4-5 reps - 20 seconds hold  ASSESSMENT:  CLINICAL IMPRESSION: Patient did well with activity today with no increase of pain. Continues to progress towards discharge. Does best with activities when demonstration is performed before attempts.   OBJECTIVE IMPAIRMENTS: Abnormal gait, decreased activity tolerance, decreased ROM, decreased strength, increased fascial restrictions, impaired flexibility, improper body mechanics, postural dysfunction, and pain.   ACTIVITY LIMITATIONS: carrying, lifting, bending, standing, stairs, transfers, bed mobility, locomotion level, and caring for others  PARTICIPATION LIMITATIONS: meal prep, cleaning, laundry, driving, shopping, community activity, and occupation  PERSONAL  FACTORS: Age, Behavior pattern, Education, Fitness, Past/current experiences, Profession, Sex, Social background, and Time since onset of injury/illness/exacerbation are also affecting patient's functional outcome.   REHAB POTENTIAL: Fair chronicity of impairments   CLINICAL DECISION MAKING: Stable/uncomplicated  EVALUATION COMPLEXITY: Low   GOALS: Goals reviewed with patient? No  SHORT TERM GOALS: Target date: 02/24/2023    Will be compliant with appropriate progressive HEP  Baseline: Goal status: Met  2.  Will demonstrate good biomechanics for bed mobility and floor to waist lifting  Baseline:  Goal status: Met  3.  Flexibility impairments to have improved by at least 50% Baseline:  Goal status: partially met    LONG TERM GOALS: Target date: 04/21/2023    MMT to improve by at least one grade in all weak groups  Baseline:  Goal status: on going 04/01/2023  2.  Pain to be no more than 2/10 at worst  Baseline:  Goal status: on going 04/01/2023  3.  Will have been able to return to outdoor exercise and fishing trips with no increase in pain from resting levels and no subjective unsteadiness over uneven surfaces to improve QOL  Baseline:  Goal status: on going 04/01/2023  4.  Will be able to stand statically for at least 30 minutes without increase in pain from resting levels to allow her to do functional tasks at home such as cooking  Baseline:  Goal status:on going 04/01/2023  5.  FOTO score to have met or exceeded predicted  value by time of DC  Baseline:  Goal status: Met 03/17/2023    PLAN:  PT FREQUENCY: 1-2/x week if necessary  PT DURATION: 5 weeks  PLANNED INTERVENTIONS: 97164- PT Re-evaluation, 97110-Therapeutic exercises, 97530- Therapeutic activity, 97112- Neuromuscular re-education, 97535- Self Care, 16109- Manual therapy, [id number]- Gait training, [id number]- Aquatic Therapy, 97014- Electrical stimulation (unattended), Taping, Dry Needling, Cryotherapy, and Moist  heat.  PLAN FOR NEXT SESSION: expect discharge, finalize HEP for discharge, address residual goals/functional outcomes   Chyrel Masson, PT, DPT, OCS, ATC 04/15/23  7:50 AM     Date of referral: 01/13/23 Referring provider: Nadara Mustard, MD Referring diagnosis? Diagnosis M54.50,G89.29 (ICD-10-CM) - Chronic left-sided low back pain without sciatica M25.562,G89.29 (ICD-10-CM) - Chronic pain of left knee Treatment diagnosis? (if different than referring diagnosis) M54.59, M79.605, M62.81, R29.898  What was this (referring dx) caused by? Ongoing Issue  Ashby Dawes of Condition: Initial Onset (within last 3 months)   Laterality: Lt  Current Functional Measure Score: FOTO 48  Objective measurements identify impairments when they are compared to normal values, the uninvolved extremity, and prior level of function.  [x]  Yes  []  No  Objective assessment of functional ability: Moderate functional limitations   Briefly describe symptoms: pain in L knee coming from the back, tends to really hurt and become painful with static standing, OK with movement   How did symptoms start: insidiously   Average pain intensity:  Last 24 hours: 7/10  Past week: 8/10  How often does the pt experience symptoms? Frequently  How much have the symptoms interfered with usual daily activities? Quite a bit  How has condition changed since care began at this facility? A little worse  In general, how is the patients overall health? Good   BACK PAIN (STarT Back Screening Tool) Has pain spread down the leg(s) at some time in the last 2 weeks? Yes  Has there been pain in the shoulder or neck at some time in the last 2 weeks? Yes  Has the pt only walked short distances because of back pain? Yes  Does patient have worrying thoughts a lot of the time? Yes  Has patient stopped enjoying things they usually enjoy? Yes

## 2023-04-16 ENCOUNTER — Ambulatory Visit: Payer: Medicare Other | Admitting: Adult Health

## 2023-04-16 ENCOUNTER — Encounter: Payer: Self-pay | Admitting: Adult Health

## 2023-04-16 DIAGNOSIS — Z87891 Personal history of nicotine dependence: Secondary | ICD-10-CM

## 2023-04-16 NOTE — Progress Notes (Signed)
  Virtual Visit via Telephone Note  I connected with Sue Mitchell , 04/16/23 10:45 AM by a telemedicine application and verified that I am speaking with the correct person using two identifiers.  Location: Patient: home Provider: home   I discussed the limitations of evaluation and management by telemedicine and the availability of in person appointments. The patient expressed understanding and agreed to proceed.   Shared Decision Making Visit Lung Cancer Screening Program [phone])   Eligibility: 68 y.o. Pack Years Smoking History Calculation =27 pack years (# packs/per year x # years smoked) Recent History of coughing up blood  no Unexplained weight loss? no ( >Than 15 pounds within the last 6 months ) Prior History Lung / other cancer no (Diagnosis within the last 5 years already requiring surveillance chest CT Scans). Smoking Status Former Smoker Former Smokers: Years since quit: 14 years  Quit Date: 2011  Visit Components: Discussion included one or more decision making aids. YES Discussion included risk/benefits of screening. YES Discussion included potential follow up diagnostic testing for abnormal scans. YES Discussion included meaning and risk of over diagnosis. YES Discussion included meaning and risk of False Positives. YES Discussion included meaning of total radiation exposure. YES  Counseling Included: Importance of adherence to annual lung cancer LDCT screening. YES Impact of comorbidities on ability to participate in the program. YES Ability and willingness to under diagnostic treatment. YES  Smoking Cessation Counseling:  Former Smokers:  Discussed the importance of maintaining cigarette abstinence. yes Diagnosis Code: Personal History of Nicotine Dependence. Q03.474 Information about tobacco cessation classes and interventions provided to patient. Yes Patient provided with "ticket" for LDCT Scan. yes Written Order for Lung Cancer Screening with  LDCT placed in Epic. Yes (CT Chest Lung Cancer Screening Low Dose W/O CM) [id number]  Z12.2-Screening of respiratory organs Z87.891-Personal history of nicotine dependence   Danford Bad 04/16/23

## 2023-04-16 NOTE — Patient Instructions (Signed)

## 2023-04-21 ENCOUNTER — Ambulatory Visit
Admission: RE | Admit: 2023-04-21 | Discharge: 2023-04-21 | Disposition: A | Discharge status: Home / Self Care | Payer: Medicare Other | Source: Ambulatory Visit | Attending: Family Medicine | Admitting: Family Medicine

## 2023-04-21 DIAGNOSIS — Z122 Encounter for screening for malignant neoplasm of respiratory organs: Secondary | ICD-10-CM

## 2023-04-21 DIAGNOSIS — Z87891 Personal history of nicotine dependence: Secondary | ICD-10-CM

## 2023-04-21 DIAGNOSIS — F17210 Nicotine dependence, cigarettes, uncomplicated: Secondary | ICD-10-CM | POA: Diagnosis not present

## 2023-04-22 ENCOUNTER — Other Ambulatory Visit: Payer: Self-pay

## 2023-04-22 ENCOUNTER — Ambulatory Visit: Payer: Medicare Other | Admitting: Allergy and Immunology

## 2023-04-22 VITALS — BP 124/78 | HR 71 | Temp 97.9°F | Resp 16 | Ht 59.0 in | Wt 151.2 lb

## 2023-04-22 DIAGNOSIS — J4489 Other specified chronic obstructive pulmonary disease: Secondary | ICD-10-CM

## 2023-04-22 DIAGNOSIS — Z91038 Other insect allergy status: Secondary | ICD-10-CM | POA: Diagnosis not present

## 2023-04-22 DIAGNOSIS — J3089 Other allergic rhinitis: Secondary | ICD-10-CM | POA: Diagnosis not present

## 2023-04-22 DIAGNOSIS — K219 Gastro-esophageal reflux disease without esophagitis: Secondary | ICD-10-CM

## 2023-04-22 MED ORDER — ALBUTEROL SULFATE (2.5 MG/3ML) 0.083% IN NEBU: 2.5000 mg | INHALATION_SOLUTION | Freq: Four times a day (QID) | RESPIRATORY_TRACT | 1 refills | Status: DC | PRN

## 2023-04-22 MED ORDER — BREZTRI AEROSPHERE 160-9-4.8 MCG/ACT IN AERO: 2.0000 | INHALATION_SPRAY | Freq: Two times a day (BID) | RESPIRATORY_TRACT | 1 refills | Status: DC

## 2023-04-22 MED ORDER — NEFFY 2 MG/0.1ML NA SOLN: 1.0000 | NASAL | 1 refills | Status: DC | PRN

## 2023-04-22 MED ORDER — FLUTICASONE PROPIONATE 50 MCG/ACT NA SUSP: 1.0000 | Freq: Every day | NASAL | 1 refills | Status: DC

## 2023-04-22 MED ORDER — QVAR REDIHALER 80 MCG/ACT IN AERB: 2.0000 | INHALATION_SPRAY | RESPIRATORY_TRACT | 1 refills | Status: DC | PRN

## 2023-04-22 MED ORDER — FAMOTIDINE 40 MG PO TABS
40.0000 mg | ORAL_TABLET | Freq: Two times a day (BID) | ORAL | 1 refills | Status: DC
Start: 2023-04-22 — End: 2023-06-30

## 2023-04-22 MED ORDER — CETIRIZINE HCL 10 MG PO TABS: 10.0000 mg | ORAL_TABLET | Freq: Every day | ORAL | 1 refills | Status: DC | PRN

## 2023-04-22 MED ORDER — ALBUTEROL SULFATE HFA 108 (90 BASE) MCG/ACT IN AERS: 2.0000 | INHALATION_SPRAY | RESPIRATORY_TRACT | 0 refills | Status: DC | PRN

## 2023-04-22 NOTE — Progress Notes (Unsigned)
 [place] [facility] [place] - [employer] - [place] -    Follow-up Note  Referring Provider: Renaye Rakers, MD Primary Provider: Renaye Rakers, MD Date of Office Visit: 04/22/2023  Subjective:   Sue Mitchell (DOB: Nov 18, 1955) is a 68 y.o. female who [facility] in re-evaluation of the following:  HPI: Rashawna returns to this clinic in evaluation of asthma, COPD, allergic rhinitis, LPR.  I last saw her in this clinic 31 December 2022.  Overall she has really done very well with her airway and has very little issues with wheezing or coughing or shortness of breath while she continues to use anti-inflammatory agents for her airway twice a day as well as a short acting bronchodilator just in the morning.  She has not required a systemic steroid or an antibiotic for her airway since I have seen her in this clinic.  She has had very little issues with her nose and she can smell and taste without any problem.  She believes that her reflux is under very good control at this point on her current therapy.  She informs me that she has a history of having problems with bee stings.  In the past she has developed significant shortness of breath when being stung by a bee.  Allergies as of 04/22/2023       Reactions   Codeine Hives, Itching, Other (See Comments)   All over the body, including burning sensation.   Ciprofloxacin Palpitations        Medication List    acyclovir 200 MG capsule Commonly known as: ZOVIRAX Take 200 mg by mouth 2 (two) times daily as needed (outbreaks).   albuterol (2.5 MG/3ML) 0.083% nebulizer solution Commonly known as: PROVENTIL Take 3 mLs (2.5 mg total) by nebulization every 6 (six) hours as needed for wheezing or shortness of breath.   albuterol 108 (90 Base) MCG/ACT inhaler Commonly known as: VENTOLIN HFA Inhale 2 puffs into the lungs every 4 (four) hours as needed for wheezing or shortness of breath.    amLODipine 5 MG tablet Commonly known as: NORVASC Take 1 tablet (5 mg total) by mouth daily.   ascorbic acid 1000 MG tablet Commonly known as: VITAMIN C Take 1,000 mg by mouth daily.   Asmanex HFA 200 MCG/ACT Aero Generic drug: Mometasone Furoate Inhale 2 Inhalations into the lungs every 4 (four) hours as needed.   aspirin EC 81 MG tablet Take 81 mg by mouth daily.   B Complex Vitamins (w/ FA) Caps Take 1 capsule by mouth daily.   Breztri Aerosphere 160-9-4.8 MCG/ACT Aero Generic drug: Budeson-Glycopyrrol-Formoterol Inhale 2 puffs into the lungs in the morning and at bedtime. Inhale two puffs with spacer twice daily to prevent cough or wheeze.  Rinse, gargle, and spit after use.   cetirizine 10 MG tablet Commonly known as: ZYRTEC Take 1 tablet (10 mg total) by mouth daily as needed for allergies (Can take an extra dose during flare up.).   famotidine 40 MG tablet Commonly known as: PEPCID TAKE 1 TABLET BY MOUTH TWICE  DAILY   fluticasone 50 MCG/ACT nasal spray Commonly known as: FLONASE Place 1 spray into both nostrils daily.   gabapentin 100 MG capsule Commonly known as: Neurontin Take 1 capsule (100 mg total) by mouth 3 (three) times daily. One three times daily   hydrochlorothiazide 12.5 MG capsule Commonly known as: MICROZIDE Take 1 capsule (12.5 mg total) by mouth daily.   multivitamin capsule Take 1 capsule  by mouth daily.   Nebulizer Mask Adult Misc 1 kit by Does not apply route as needed.   Omega 3 1000 MG Caps Take 1,000 mg by mouth daily.   Qvar RediHaler 80 MCG/ACT inhaler Generic drug: beclomethasone Inhale 2 puffs into the lungs 2 (two) times daily.   Spacer/Aero-Holding [employer] 1 Device by Does not apply route as directed.   valACYclovir 500 MG tablet Commonly known as: VALTREX Take 500 mg by mouth 2 (two) times daily.   valsartan 40 MG tablet Commonly known as: DIOVAN Take 1 tablet by mouth daily.   Vitamin D2 10 MCG (400 UNIT)  Tabs Take 1 tablet by mouth daily.      Past Medical History:  Diagnosis Date   Abdominal distension    Abdominal pain    Arthritis    Asthma    Biliary colic    COPD (chronic obstructive pulmonary disease) (HCC)    Diarrhea    GERD (gastroesophageal reflux disease)    Hypertension    Nausea     Past Surgical History:  Procedure Laterality Date   cartilage removal  2007   left knee   CESAREAN SECTION  1976   LAPAROSCOPY  1992 (approx)    abdominal    Review of systems negative except as noted in HPI / PMHx or noted below:  Review of Systems  Constitutional: Negative.   HENT: Negative.    Eyes: Negative.   Respiratory: Negative.    Cardiovascular: Negative.   Gastrointestinal: Negative.   Genitourinary: Negative.   Musculoskeletal: Negative.   Skin: Negative.   Neurological: Negative.   Endo/Heme/Allergies: Negative.   Psychiatric/Behavioral: Negative.       Objective:   Vitals:   04/22/23 1111  BP: 124/78  Pulse: 71  Resp: 16  Temp: 97.9 F (36.6 C)  SpO2: 95%   Height: 4\' 11"  (149.9 cm)  Weight: 151 lb 3.2 oz (68.6 kg)   Physical Exam Constitutional:      Appearance: She is not diaphoretic.  HENT:     Head: Normocephalic.     Right Ear: Tympanic membrane, ear canal and external ear normal.     Left Ear: Tympanic membrane, ear canal and external ear normal.     Nose: Nose normal. No mucosal edema or rhinorrhea.     Mouth/Throat:     Pharynx: Uvula midline. No oropharyngeal exudate.  Eyes:     Conjunctiva/sclera: Conjunctivae normal.  Neck:     Thyroid: No thyromegaly.     Trachea: Trachea normal. No tracheal tenderness or tracheal deviation.  Cardiovascular:     Rate and Rhythm: Normal rate and regular rhythm.     Heart sounds: Normal heart sounds, S1 normal and S2 normal. No murmur heard. Pulmonary:     Effort: No respiratory distress.     Breath sounds: Normal breath sounds. No stridor. No wheezing or rales.  Lymphadenopathy:      Head:     Right side of head: No tonsillar adenopathy.     Left side of head: No tonsillar adenopathy.     Cervical: No cervical adenopathy.  Skin:    Findings: No erythema or rash.     Nails: There is no clubbing.  Neurological:     Mental Status: She is alert.     Diagnostics:    Spirometry was performed and demonstrated an FEV1 of 1.25 at 76 % of predicted.  The patient had an Asthma Control Test with the following results: ACT Total Score:  16.    Assessment and Plan:   1. COPD with asthma (HCC)   2. Perennial allergic rhinitis   3. LPRD (laryngopharyngeal reflux disease)   4. History of systemic reaction to hymenoptera sting    1.  Allergen avoidance measures - dust mite  2.  Treat and prevent inflammation of airway:   A.  BREZTRI -2 inhalations 1-2 times per day with spacer (empty lungs)  B.  Flonase -1 spray each nostril 3-7 times per week  3.  Treat and prevent reflux/LPR:   A.  Famotidine 40 mg -1 tablet 2 times per day  B.  Consolidate all forms of caffeine consumption  C.  Replace throat clearing with swallowing/drinking maneuver  4.  If needed:   A.  Albuterol + Qvar 80 -2 inhalations TOGETHER every 4-6 hrs B.  Albuterol  nebulization + Qvar 80 - 2 inhaltions TOGETHER every 4-6 hrs  C.  Cetirizine 10 mg -1 tablet 1 time per day  D. NEFFY, benadryl, MD/ER evaluation for allergic reaction  5.  Return to clinic in 3 months or earlier if problem  6. Influenza = Tamiflu. Covid = Paxlovid  Basha appears to be doing pretty well at this point in time while using anti-inflammatory agents for airway on a pretty consistent basis and also addressing the issue of LPR.  With this combination she has had very little problem and we are going to keep her on these anti-inflammatory agents and therapy for reflux and see her back in this clinic in 3 months or earlier if there is a problem.  For her history of bee venom induced breathing problems we will give her intranasal  epinephrine to be used in case she ever has a allergic reaction in the future.  Laurette Schimke, MD Allergy / Immunology [facility]

## 2023-04-22 NOTE — Patient Instructions (Addendum)
  1.  Allergen avoidance measures - dust mite  2.  Treat and prevent inflammation of airway:   A.  BREZTRI -2 inhalations 1-2 times per day with spacer (empty lungs)  B.  Flonase -1 spray each nostril 3-7 times per week  3.  Treat and prevent reflux/LPR:   A.  Famotidine 40 mg -1 tablet 2 times per day  B.  Consolidate all forms of caffeine consumption  C.  Replace throat clearing with swallowing/drinking maneuver  4.  If needed:   A.  Albuterol + Qvar 80 -2 inhalations TOGETHER every 4-6 hrs B.  Albuterol  nebulization + Qvar 80 - 2 inhaltions TOGETHER every 4-6 hrs  C.  Cetirizine 10 mg -1 tablet 1 time per day  D. NEFFY, benadryl, MD/ER evaluation for allergic reaction  5.  Return to clinic in 3 months or earlier if problem  6. Influenza = Tamiflu. Covid = Paxlovid

## 2023-04-23 ENCOUNTER — Telehealth: Payer: Self-pay

## 2023-04-23 ENCOUNTER — Encounter: Payer: Self-pay | Admitting: Allergy and Immunology

## 2023-04-23 ENCOUNTER — Telehealth: Payer: Self-pay | Admitting: Acute Care

## 2023-04-23 MED ORDER — EPINEPHRINE 0.3 MG/0.3ML IJ SOAJ: 0.3000 mg | Freq: Once | INTRAMUSCULAR | 1 refills | Status: AC

## 2023-04-23 NOTE — Telephone Encounter (Signed)
 Patient called into office requesting results of LDCT. Patient had scan on 2/24 and results have not come back yet. Advised pt that currently results are taking about 1.5-2 weeks to come back. I also advised patient because she is active on MyChart we would send the results to her via MyChart if the recommendations are to have 12 month scan otherwise we will call her if there is anything of concern. Pt verbalized understanding and denied any further questions or concerns at this time.

## 2023-04-23 NOTE — Telephone Encounter (Signed)
 Left detailed message for pt explaining the change in therapy and that epipen was sent to [facility] [address]

## 2023-04-23 NOTE — Telephone Encounter (Signed)
 Epipen is preferred by insurance rather in neffy do you want to attempt pa?

## 2023-04-23 NOTE — Telephone Encounter (Signed)
 CMM Key: [id number]

## 2023-04-23 NOTE — Telephone Encounter (Signed)
*  Asthma/Allergy  Pharmacy Patient Advocate Encounter   Received notification from CoverMyMeds that prior authorization for Neffy 2MG /0. solution  is required/requested.   Insurance verification completed.   The patient is insured through [facility] .   Per test claim:  EpiPen is preferred by the insurance.  If suggested medication is appropriate, Please send in a new RX and discontinue this one. If not, please advise as to why it's not appropriate so that we may request a Prior Authorization. Please note, some preferred medications may still require a PA.  If the suggested medications have not been trialed and there are no contraindications to their use, the PA will not be submitted, as it will not be approved.

## 2023-05-01 ENCOUNTER — Telehealth: Payer: Self-pay

## 2023-05-01 ENCOUNTER — Other Ambulatory Visit (HOSPITAL_COMMUNITY): Payer: Self-pay

## 2023-05-01 MED ORDER — QVAR REDIHALER 80 MCG/ACT IN AERB: 2.0000 | INHALATION_SPRAY | RESPIRATORY_TRACT | 1 refills | Status: AC | PRN

## 2023-05-01 NOTE — Telephone Encounter (Signed)
 Pharmacy notified pa not needed copay $47

## 2023-05-01 NOTE — Addendum Note (Signed)
 Addended by: Rosana Fret L on: 05/01/2023 10:14 AM   Modules accepted: Orders

## 2023-05-01 NOTE — Telephone Encounter (Signed)
 Pharmacy Patient Advocate Encounter   Received notification from CoverMyMeds that prior authorization for Qvar RediHaler 80MCG/ACT aerosol is required/requested.   Insurance verification completed.   The patient is insured through [facility] Medicare Part D .   Per test claim: The current 30 day co-pay is, $47.00.  No PA needed at this time. This test claim was processed through [facility]- copay amounts may vary at other pharmacies due to pharmacy/plan contracts, or as the patient moves through the different stages of their insurance plan.

## 2023-05-08 DIAGNOSIS — Z1231 Encounter for screening mammogram for malignant neoplasm of breast: Secondary | ICD-10-CM | POA: Diagnosis not present

## 2023-05-09 DIAGNOSIS — I10 Essential (primary) hypertension: Secondary | ICD-10-CM | POA: Diagnosis not present

## 2023-05-09 DIAGNOSIS — M130 Polyarthritis, unspecified: Secondary | ICD-10-CM | POA: Diagnosis not present

## 2023-05-09 DIAGNOSIS — R7309 Other abnormal glucose: Secondary | ICD-10-CM | POA: Diagnosis not present

## 2023-05-09 DIAGNOSIS — E7800 Pure hypercholesterolemia, unspecified: Secondary | ICD-10-CM | POA: Diagnosis not present

## 2023-05-12 ENCOUNTER — Other Ambulatory Visit: Payer: Self-pay

## 2023-05-12 DIAGNOSIS — Z87891 Personal history of nicotine dependence: Secondary | ICD-10-CM

## 2023-05-12 DIAGNOSIS — Z122 Encounter for screening for malignant neoplasm of respiratory organs: Secondary | ICD-10-CM

## 2023-05-26 ENCOUNTER — Other Ambulatory Visit: Payer: Self-pay | Admitting: Internal Medicine

## 2023-05-26 ENCOUNTER — Other Ambulatory Visit: Payer: Self-pay | Admitting: Orthopedic Surgery

## 2023-05-26 NOTE — Telephone Encounter (Signed)
 Please advise if appropriate to continue

## 2023-05-26 NOTE — Telephone Encounter (Signed)
**Note De-identified  [place] Obfuscation** Please advise 

## 2023-06-30 ENCOUNTER — Other Ambulatory Visit: Payer: Self-pay | Admitting: Allergy and Immunology

## 2023-07-01 ENCOUNTER — Ambulatory Visit: Payer: Medicare Other | Admitting: Allergy and Immunology

## 2023-07-01 VITALS — BP 122/78 | HR 84 | Temp 97.7°F | Resp 16 | Ht 59.0 in | Wt 155.3 lb

## 2023-07-01 DIAGNOSIS — J3089 Other allergic rhinitis: Secondary | ICD-10-CM

## 2023-07-01 DIAGNOSIS — Z91038 Other insect allergy status: Secondary | ICD-10-CM

## 2023-07-01 DIAGNOSIS — J441 Chronic obstructive pulmonary disease with (acute) exacerbation: Secondary | ICD-10-CM | POA: Diagnosis not present

## 2023-07-01 DIAGNOSIS — K219 Gastro-esophageal reflux disease without esophagitis: Secondary | ICD-10-CM | POA: Diagnosis not present

## 2023-07-01 MED ORDER — ALBUTEROL SULFATE HFA 108 (90 BASE) MCG/ACT IN AERS: 2.0000 | INHALATION_SPRAY | RESPIRATORY_TRACT | 0 refills | Status: DC | PRN

## 2023-07-01 MED ORDER — METHYLPREDNISOLONE ACETATE 80 MG/ML IJ SUSP
80.0000 mg | Freq: Once | INTRAMUSCULAR | Status: AC
  Administered 2023-07-01: 80 mg via INTRAMUSCULAR

## 2023-07-01 MED ORDER — FLUTICASONE PROPIONATE 50 MCG/ACT NA SUSP
NASAL | 5 refills | Status: DC
Start: 2023-07-01 — End: 2023-09-30

## 2023-07-01 MED ORDER — BREZTRI AEROSPHERE 160-9-4.8 MCG/ACT IN AERO: 2.0000 | INHALATION_SPRAY | Freq: Two times a day (BID) | RESPIRATORY_TRACT | 1 refills | Status: DC

## 2023-07-01 MED ORDER — FAMOTIDINE 40 MG PO TABS: 40.0000 mg | ORAL_TABLET | Freq: Two times a day (BID) | ORAL | 3 refills | Status: DC

## 2023-07-01 MED ORDER — AZITHROMYCIN 250 MG PO TABS: 250.0000 mg | ORAL_TABLET | Freq: Every day | ORAL | 0 refills | Status: AC

## 2023-07-01 MED ORDER — CETIRIZINE HCL 10 MG PO TABS: 10.0000 mg | ORAL_TABLET | Freq: Every day | ORAL | 3 refills | Status: DC | PRN

## 2023-07-01 NOTE — Patient Instructions (Addendum)
  1.  Allergen avoidance measures - dust mite  2.  Treat and prevent inflammation of airway:   A.  BREZTRI  -2 inhalations 1-2 times per day with spacer (empty lungs)  B.  Flonase  -1 spray each nostril 3-7 times per week  3.  Treat and prevent reflux/LPR:   A.  Famotidine  40 mg -1 tablet 2 times per day  B.  Consolidate all forms of caffeine consumption  C.  Replace throat clearing with swallowing/drinking maneuver  4.  If needed:   A.  Albuterol  + Qvar  80 (or similar) -2 inhalations TOGETHER every 4-6 hrs B.  Albuterol   nebulization + Qvar  80 (or similar) - 2 inhaltions TOGETHER every 4-6 hrs  C.  Cetirizine  10 mg -1 tablet 1 time per day  D.  NEFFY, benadryl, MD/ER evaluation for allergic reaction  5. For this recent event:   A. Use Breztri  - 2 times per day  B. Depomedrol 80 mg IM delivered in clinic today  C. Azithromycin  250 mg - 1 tablet 1 time per day for 6 days  5.  Return to clinic in 3 months or earlier if problem  6. Influenza = Tamiflu . Covid = Paxlovid

## 2023-07-01 NOTE — Progress Notes (Unsigned)
 [place]   Follow-up Note  Referring Provider: Jonathon Neighbors, MD Primary Provider: Jonathon Neighbors, MD Date of Office Visit: 07/01/2023  Subjective:   Sue Mitchell (DOB: 1956/02/15) is a 68 y.o. female who [facility] in re-evaluation of the following:  HPI: Leniah returns to this clinic in evaluation of asthma, COPD, allergic rhinitis, LPR.  I last saw her in this clinic 22 April 2023.  She was really doing very well but unfortunately about 3 weeks ago she developed cough and sneezing and shortness of breath along with some wheezing and her nose is stuffy and she cannot smell and she has lots of throat clearing and lots of postnasal drip.  She has not had any fever, aches, chills, or other associated systemic or constitutional symptoms.  She does not have any ugly nasal discharge, ugly sputum production, chest pain.  She is using her Breztri  just 1 time per day and continues to use some Flonase .  When she uses a short acting bronchodilator it does appear to help her cough.  She is treating her reflux with famotidine  twice a day and she does believe that her reflux is under very good control.   Allergies as of 07/01/2023       Reactions   Codeine  Hives, Itching, Other (See Comments)   All over the body, including burning sensation.   Ciprofloxacin Palpitations        Medication List    acyclovir 200 MG capsule Commonly known as: ZOVIRAX Take 200 mg by mouth 2 (two) times daily as needed (outbreaks).   albuterol  (2.5 MG/3ML) 0.083% nebulizer solution Commonly known as: PROVENTIL  Take 3 mLs (2.5 mg total) by nebulization every 6 (six) hours as needed for wheezing or shortness of breath.   albuterol  108 (90 Base) MCG/ACT inhaler Commonly known as: VENTOLIN  HFA Inhale 2 puffs into the lungs every 4 (four) hours as needed for wheezing or shortness of breath.   amLODipine  5 MG  tablet Commonly known as: NORVASC  Take 1 tablet (5 mg total) by mouth daily.   ascorbic acid 1000 MG tablet Commonly known as: VITAMIN C Take 1,000 mg by mouth daily.   Asmanex  HFA 200 MCG/ACT Aero Generic drug: Mometasone  Furoate Inhale 2 Inhalations into the lungs every 4 (four) hours as needed.   aspirin EC 81 MG tablet Take 81 mg by mouth daily.   B Complex Vitamins (w/ FA) Caps Take 1 capsule by mouth daily.   Breztri  Aerosphere 160-9-4.8 MCG/ACT Aero inhaler Generic drug: budeson-glycopyrrolate-formoterol  Inhale 2 puffs into the lungs in the morning and at bedtime. Inhale two puffs with spacer twice daily to prevent cough or wheeze.  Rinse, gargle, and spit after use.   cetirizine  10 MG tablet Commonly known as: ZYRTEC  Take 1 tablet (10 mg total) by mouth daily as needed for allergies (Can take an extra dose during flare up.).   famotidine  40 MG tablet Commonly known as: PEPCID  TAKE 1 TABLET BY MOUTH TWICE  DAILY   fluticasone  50 MCG/ACT nasal spray Commonly known as: FLONASE  Place 1 spray into both nostrils daily.   gabapentin  100 MG capsule Commonly known as: NEURONTIN  TAKE ONE CAPSULE BY MOUTH THREE TIMES DAILY   hydrochlorothiazide  12.5 MG capsule Commonly known as: MICROZIDE  Take 1 capsule (12.5 mg total) by mouth daily.   multivitamin capsule Take 1 capsule by mouth daily.   Nebulizer Mask Adult Misc 1 kit by Does  not apply route as needed.   Neffy 2 MG/0.1ML Soln Generic drug: EPINEPHrine  Place 1 spray into the nose as needed.   Omega 3 1000 MG Caps Take 1,000 mg by mouth daily.   predniSONE  10 MG tablet Commonly known as: DELTASONE  TAKE 1 TABLET(10 MG) BY MOUTH DAILY WITH BREAKFAST   Qvar  RediHaler 80 MCG/ACT inhaler Generic drug: beclomethasone Inhale 2 puffs into the lungs every 4 (four) hours as needed (Take along with albuterol  during flare ups.).   RABEprazole  20 MG tablet Commonly known as: ACIPHEX  TAKE 1 TABLET BY MOUTH 30-60 MINS  BEFORE THE FIRST AND LAST MEAL OF THE DAY   Spacer/Aero-Holding Ismael Maria 1 Device by Does not apply route as directed.   valACYclovir 500 MG tablet Commonly known as: VALTREX Take 500 mg by mouth 2 (two) times daily.   valsartan 40 MG tablet Commonly known as: DIOVAN Take 1 tablet by mouth daily.   Vitamin D2 10 MCG (400 UNIT) Tabs Take 1 tablet by mouth daily.    Past Medical History:  Diagnosis Date  . Abdominal distension   . Abdominal pain   . Arthritis   . Asthma   . Biliary colic   . COPD (chronic obstructive pulmonary disease) (HCC)   . Diarrhea   . GERD (gastroesophageal reflux disease)   . Hypertension   . Nausea     Past Surgical History:  Procedure Laterality Date  . cartilage removal  2007   left knee  . CESAREAN SECTION  1976  . LAPAROSCOPY  1992 (approx)    abdominal    Review of systems negative except as noted in HPI / PMHx or noted below:  Review of Systems  Constitutional: Negative.   HENT: Negative.    Eyes: Negative.   Respiratory: Negative.    Cardiovascular: Negative.   Gastrointestinal: Negative.   Genitourinary: Negative.   Musculoskeletal: Negative.   Skin: Negative.   Neurological: Negative.   Endo/Heme/Allergies: Negative.   Psychiatric/Behavioral: Negative.       Objective:   There were no vitals filed for this visit.    Weight: 155 lb 4.8 oz (70.4 kg)   Physical Exam Constitutional:      Appearance: She is not diaphoretic.  HENT:     Head: Normocephalic.     Right Ear: Tympanic membrane, ear canal and external ear normal.     Left Ear: Tympanic membrane, ear canal and external ear normal.     Nose: Nose normal. No mucosal edema or rhinorrhea.     Mouth/Throat:     Pharynx: Uvula midline. No oropharyngeal exudate.  Eyes:     Conjunctiva/sclera: Conjunctivae normal.  Neck:     Thyroid: No thyromegaly.     Trachea: Trachea normal. No tracheal tenderness or tracheal deviation.  Cardiovascular:     Rate and  Rhythm: Normal rate and regular rhythm.     Heart sounds: Normal heart sounds, S1 normal and S2 normal. No murmur heard. Pulmonary:     Effort: No respiratory distress.     Breath sounds: No stridor. Wheezing present. No rales.  Lymphadenopathy:     Head:     Right side of head: No tonsillar adenopathy.     Left side of head: No tonsillar adenopathy.     Cervical: No cervical adenopathy.  Skin:    Findings: No erythema or rash.     Nails: There is no clubbing.  Neurological:     Mental Status: She is alert.    Diagnostics:  Spirometry was performed and demonstrated an FEV1 of 0.99 at 60 % of predicted.  Assessment and Plan:   1. Asthma with COPD with exacerbation (HCC)   2. Perennial allergic rhinitis   3. LPRD (laryngopharyngeal reflux disease)   4. History of systemic reaction to hymenoptera sting    1.  Allergen avoidance measures - dust mite  2.  Treat and prevent inflammation of airway:   A.  BREZTRI  -2 inhalations 1-2 times per day with spacer (empty lungs)  B.  Flonase  -1 spray each nostril 3-7 times per week  3.  Treat and prevent reflux/LPR:   A.  Famotidine  40 mg -1 tablet 2 times per day  B.  Consolidate all forms of caffeine consumption  C.  Replace throat clearing with swallowing/drinking maneuver  4.  If needed:   A.  Albuterol  + Qvar  80 (or similar) -2 inhalations TOGETHER every 4-6 hrs B.  Albuterol   nebulization + Qvar  80 (or similar) - 2 inhaltions TOGETHER every 4-6 hrs  C.  Cetirizine  10 mg -1 tablet 1 time per day  D.  NEFFY, benadryl, MD/ER evaluation for allergic reaction  5. For this recent event:   A. Use Breztri  - 2 times per day  B. Depomedrol 80 mg IM delivered in clinic today  C. Azithromycin  250 mg - 1 tablet 1 time per day for 6 days  5.  Return to clinic in 3 months or earlier if problem  6. Influenza = Tamiflu . Covid = Paxlovid  Emma-Lee appears to have possibly contracted a respiratory tract infection is giving rise to her  recent exacerbation and we will treat her with anti-inflammatory medications with a systemic steroid and a broad-spectrum antibiotic with azithromycin  while she maintains therapy directed against inflammation as noted above and also directed against her LPR as noted above.  Assuming she does well I will see her back in this clinic in 3 months or earlier if there is a problem.  Schuyler Custard, MD Allergy  / Immunology [facility]

## 2023-07-02 ENCOUNTER — Encounter: Payer: Self-pay | Admitting: Allergy and Immunology

## 2023-07-08 ENCOUNTER — Telehealth: Payer: Self-pay

## 2023-07-08 NOTE — Telephone Encounter (Signed)
*  Asthma/Allergy   Pharmacy Patient Advocate Encounter   Received notification from Fax that prior authorization for Asmanex  HFA is required/requested.   Insurance verification completed.   The patient is insured through [facility] .   Per test claim:  Arnuity Ellipta is preferred by the insurance.  If suggested medication is appropriate, Please send in a new RX and discontinue this one. If not, please advise as to why it's not appropriate so that we may request a Prior Authorization. Please note, some preferred medications may still require a PA.  If the suggested medications have not been trialed and there are no contraindications to their use, the PA will not be submitted, as it will not be approved.

## 2023-07-09 ENCOUNTER — Ambulatory Visit (HOSPITAL_COMMUNITY)
Admission: EM | Admit: 2023-07-09 | Discharge: 2023-07-09 | Disposition: A | Discharge status: Home / Self Care | Attending: Nurse Practitioner | Admitting: Nurse Practitioner

## 2023-07-09 ENCOUNTER — Encounter (HOSPITAL_COMMUNITY): Payer: Self-pay | Admitting: Emergency Medicine

## 2023-07-09 ENCOUNTER — Telehealth: Payer: Self-pay | Admitting: Allergy and Immunology

## 2023-07-09 ENCOUNTER — Other Ambulatory Visit: Payer: Self-pay

## 2023-07-09 DIAGNOSIS — J441 Chronic obstructive pulmonary disease with (acute) exacerbation: Secondary | ICD-10-CM | POA: Diagnosis not present

## 2023-07-09 DIAGNOSIS — R051 Acute cough: Secondary | ICD-10-CM

## 2023-07-09 MED ORDER — PROMETHAZINE-DM 6.25-15 MG/5ML PO SYRP: 2.5000 mL | ORAL_SOLUTION | Freq: Every evening | ORAL | 0 refills | Status: AC | PRN

## 2023-07-09 MED ORDER — PREDNISONE 20 MG PO TABS: 40.0000 mg | ORAL_TABLET | Freq: Every day | ORAL | 0 refills | Status: AC

## 2023-07-09 NOTE — Telephone Encounter (Signed)
 Forwarding PA message to provider.

## 2023-07-09 NOTE — Discharge Instructions (Signed)
 Start taking the oral prednisone  as prescribed to treat for lung and airway inflammation.  In addition, you can use cough syrup at nighttime.  Also recommend guaifenesin  600 mg twice daily.  Your symptoms are likely due to a virus and should improve over the next few days.  Seek care if symptoms worsen.

## 2023-07-09 NOTE — Telephone Encounter (Signed)
 Patient called and stated that she is coughing really badly and would like to know if there is something that can be called in for her. She uses the [facility] on [address]. Best number to contact [phone]

## 2023-07-09 NOTE — ED Triage Notes (Signed)
 Pt endorse persistent cough and SOb for the past 4 days.

## 2023-07-09 NOTE — ED Provider Notes (Signed)
 [facility]    CSN: [id number] Arrival date & time: 07/09/23  1546      History   Chief Complaint Chief Complaint  Patient presents with   Cough    HPI Sue Mitchell is a 68 y.o. female.   Patient presents today with 4 day history of body aches that she attributes to coughing, coughing up clear mucus, shortness of breath, stuffy nose, headache from coughing, decreased appetite, and fatigue.  She denies fever, chest pain or tightness, runny nose, sore throat, ear pain, abdominal pain, nausea/vomiting, diarrhea.  No known sick contacts.  She completed a Z-Pak 2 days ago and has been using rescue inhaler without much improvement.  Medical history is significant for asthma, COPD; patient is a former smoker.    Past Medical History:  Diagnosis Date   Abdominal distension    Abdominal pain    Arthritis    Asthma    Biliary colic    COPD (chronic obstructive pulmonary disease) (HCC)    Diarrhea    GERD (gastroesophageal reflux disease)    Hypertension    Nausea     Patient Active Problem List   Diagnosis Date Noted   Former smoker 01/07/2022   Asthmatic bronchitis with exacerbation 11/21/2020   Atypical chest pain c/w IBS/ recurrent 09/27/2020   Healthcare maintenance 02/07/2020   Shoulder pain, left 01/14/2019   Pulmonary infiltrate 01/14/2019   Acute respiratory failure with hypoxia (HCC) 04/14/2018   Hemoptysis 04/11/2018   Influenza 04/11/2018   Cough 04/11/2018   Acute maxillary sinusitis 02/20/2018   Rhinitis, chronic 12/04/2017   Cough variant asthma  vs uacs with vcd 12/26/2016   Dysphonia 12/06/2016   Weakness 01/24/2016   Hypertension 02/23/2015   Obesity, Class I, BMI 30-34.9 02/23/2015   Pre-diabetes 02/23/2015    Past Surgical History:  Procedure Laterality Date   cartilage removal  2007   left knee   CESAREAN SECTION  1976   LAPAROSCOPY  1992 (approx)    abdominal    OB History   No obstetric history on file.      Home  Medications    Prior to Admission medications   Medication Sig Start Date End Date Taking? Authorizing Provider  predniSONE  (DELTASONE ) 20 MG tablet Take 2 tablets (40 mg total) by mouth daily with breakfast for 5 days. 07/09/23 07/14/23 Yes Wilhemena Harbour, NP  promethazine -dextromethorphan  (PROMETHAZINE -DM) 6.25-15 MG/5ML syrup Take 2.5 mLs by mouth at bedtime as needed for cough. 07/09/23  Yes Wilhemena Harbour, NP  acyclovir (ZOVIRAX) 200 MG capsule Take 200 mg by mouth 2 (two) times daily as needed (outbreaks). 01/22/11   [provider]  albuterol  (PROVENTIL ) (2.5 MG/3ML) 0.083% nebulizer solution Take 3 mLs (2.5 mg total) by nebulization every 6 (six) hours as needed for wheezing or shortness of breath. 04/22/23   Kozlow, Rema Care, MD  albuterol  (VENTOLIN  HFA) 108 (90 Base) MCG/ACT inhaler Inhale 2 puffs into the lungs every 4 (four) hours as needed for wheezing or shortness of breath. 07/01/23   Kozlow, Rema Care, MD  amLODipine  (NORVASC ) 5 MG tablet Take 1 tablet (5 mg total) by mouth daily. 04/15/18   Rai, Hurman Maiden, MD  ascorbic acid (VITAMIN C) 1000 MG tablet Take 1,000 mg by mouth daily. 02/23/15   [provider]  aspirin EC 81 MG tablet Take 81 mg by mouth daily. 12/06/16   [provider]  azithromycin  (ZITHROMAX ) 250 MG tablet Take 1 tablet (250 mg total) by mouth daily.  07/01/23   Kozlow, Rema Care, MD  B Complex-Folic Acid (B COMPLEX VITAMINS, W/ FA,) CAPS Take 1 capsule by mouth daily. 02/23/15   [provider]  budeson-glycopyrrolate-formoterol  (BREZTRI  AEROSPHERE) 160-9-4.8 MCG/ACT AERO inhaler Inhale 2 puffs into the lungs in the morning and at bedtime. Inhale two puffs with spacer twice daily to prevent cough or wheeze.  Rinse, gargle, and spit after use. 07/01/23   Kozlow, Rema Care, MD  cetirizine  (ZYRTEC ) 10 MG tablet Take 1 tablet (10 mg total) by mouth daily as needed for allergies (Can take an extra dose during flare up.). 07/01/23   Kozlow, Rema Care, MD   EPINEPHrine  (NEFFY) 2 MG/0.1ML SOLN Place 1 spray into the nose as needed. 04/22/23   Kozlow, Rema Care, MD  Ergocalciferol (VITAMIN D2) 10 MCG (400 UNIT) TABS Take 1 tablet by mouth daily. 02/23/15   [provider]  famotidine  (PEPCID ) 40 MG tablet Take 1 tablet (40 mg total) by mouth 2 (two) times daily. 07/01/23   Kozlow, Rema Care, MD  fluticasone  (FLONASE ) 50 MCG/ACT nasal spray Place 1 spray into both nostrils daily. 04/22/23   Kozlow, Rema Care, MD  fluticasone  (FLONASE ) 50 MCG/ACT nasal spray 1 spray each nostril 3-7 times per week 07/01/23   Kozlow, Rema Care, MD  gabapentin  (NEURONTIN ) 100 MG capsule TAKE ONE CAPSULE BY MOUTH THREE TIMES DAILY 05/26/23   Wert, Michael B, MD  hydrochlorothiazide  (MICROZIDE ) 12.5 MG capsule Take 1 capsule (12.5 mg total) by mouth daily. 04/15/18   Rai, Hurman Maiden, MD  Mometasone  Furoate (ASMANEX  HFA) 200 MCG/ACT AERO Inhale 2 Inhalations into the lungs every 4 (four) hours as needed. 12/31/22   Kozlow, Eric J, MD  Multiple Vitamin (MULTIVITAMIN) capsule Take 1 capsule by mouth daily. 02/23/15   [provider]  Omega 3 1000 MG CAPS Take 1,000 mg by mouth daily. 02/23/15   [provider]  QVAR  REDIHALER 80 MCG/ACT inhaler Inhale 2 puffs into the lungs every 4 (four) hours as needed (Take along with albuterol  during flare ups.). 05/01/23   Kozlow, Rema Care, MD  RABEprazole  (ACIPHEX ) 20 MG tablet TAKE 1 TABLET BY MOUTH 30-60 MINS BEFORE THE FIRST AND LAST MEAL OF THE DAY 05/26/23   Diamond Formica, MD  Respiratory Therapy Supplies (NEBULIZER MASK ADULT) MISC 1 kit by Does not apply route as needed. 12/31/22   Kozlow, Rema Care, MD  Spacer/Aero-Holding Idelle Majors DEVI 1 Device by Does not apply route as directed. 12/31/22   Kozlow, Eric J, MD  valACYclovir (VALTREX) 500 MG tablet Take 500 mg by mouth 2 (two) times daily. 04/10/22   [provider]  valsartan (DIOVAN) 40 MG tablet Take 1 tablet by mouth daily. 08/19/18   [provider]    Family  History Family History  Problem Relation Age of Onset   Diabetes Father     Social History Social History   Tobacco Use   Smoking status: Former    Current packs/day: 0.00    Average packs/day: 0.5 packs/day for 25.0 years (12.5 ttl pk-yrs)    Types: Cigarettes    Start date: 70    Quit date: 2011    Years since quitting: 14.3   Smokeless tobacco: Never  Vaping Use   Vaping status: Never Used  Substance Use Topics   Alcohol use: Yes    Comment: occasional 2 per month   Drug use: No     Allergies   Codeine  and Ciprofloxacin   Review of Systems Review of  Systems Per HPI  Physical Exam Triage Vital Signs ED Triage Vitals [07/09/23 1614]  Encounter Vitals Group     BP 125/70     Systolic BP Percentile      Diastolic BP Percentile      Pulse Rate 73     Resp (!) 21     Temp 98.6 F (37 C)     Temp Source Oral     SpO2 95 %     Weight      Height      Head Circumference      Peak Flow      Pain Score 0     Pain Loc      Pain Education      Exclude from Growth Chart    No data found.  Updated Vital Signs BP 125/70 (BP Location: Right Arm)   Pulse 73   Temp 98.6 F (37 C) (Oral)   Resp (!) 21   SpO2 95%   Visual Acuity Right Eye Distance:   Left Eye Distance:   Bilateral Distance:    Right Eye Near:   Left Eye Near:    Bilateral Near:     Physical Exam Vitals and nursing note reviewed.  Constitutional:      General: She is not in acute distress.    Appearance: Normal appearance. She is not ill-appearing or toxic-appearing.  HENT:     Head: Normocephalic and atraumatic.     Right Ear: Ear canal and external ear normal. There is impacted cerumen.     Left Ear: Tympanic membrane, ear canal and external ear normal.     Nose: No congestion or rhinorrhea.     Mouth/Throat:     Mouth: Mucous membranes are moist.     Pharynx: Oropharynx is clear. Posterior oropharyngeal erythema present. No oropharyngeal exudate.  Eyes:     General: No  scleral icterus.    Extraocular Movements: Extraocular movements intact.  Cardiovascular:     Rate and Rhythm: Normal rate and regular rhythm.  Pulmonary:     Effort: Pulmonary effort is normal. No respiratory distress.     Breath sounds: Wheezing (mild expiratory wheezing with cough) present. No rhonchi or rales.  Musculoskeletal:     Cervical back: Normal range of motion and neck supple.  Lymphadenopathy:     Cervical: No cervical adenopathy.  Skin:    General: Skin is warm and dry.     Coloration: Skin is not jaundiced or pale.     Findings: No erythema or rash.  Neurological:     Mental Status: She is alert and oriented to person, place, and time.  Psychiatric:        Behavior: Behavior is cooperative.      UC Treatments / Results  Labs (all labs ordered are listed, but only abnormal results are displayed) Labs Reviewed - No data to display  EKG   Radiology No results found.  Procedures Procedures (including critical care time)  Medications Ordered in UC Medications - No data to display  Initial Impression / Assessment and Plan / UC Course  I have reviewed the triage vital signs and the nursing notes.  Pertinent labs & imaging results that were available during my care of the patient were reviewed by me and considered in my medical decision making (see chart for details).   Patient is well-appearing, normotensive, afebrile, not tachycardic, not tachypneic, oxygenating well on room air.    1. Asthma with COPD with exacerbation (HCC)  Overall, vitals and exam are stable in urgent care today Will treat patient for COPD exacerbation with prednisone  40 mg daily for 5 days, scheduled albuterol  every 6 hours, guaifenesin  Patient specifically requesting cough syrup, low-dose Promethazine  DM sent in Return and ER precautions discussed with patient  The patient was given the opportunity to ask questions.  All questions answered to their satisfaction.  The patient is in  agreement to this plan.   Final Clinical Impressions(s) / UC Diagnoses   Final diagnoses:  Asthma with COPD with exacerbation [facility])     Discharge Instructions      Start taking the oral prednisone  as prescribed to treat for lung and airway inflammation.  In addition, you can use cough syrup at nighttime.  Also recommend guaifenesin  600 mg twice daily.  Your symptoms are likely due to a virus and should improve over the next few days.  Seek care if symptoms worsen.   ED Prescriptions     Medication Sig Dispense Auth. Provider   promethazine -dextromethorphan  (PROMETHAZINE -DM) 6.25-15 MG/5ML syrup Take 2.5 mLs by mouth at bedtime as needed for cough. 118 mL Thena Fireman A, NP   predniSONE  (DELTASONE ) 20 MG tablet Take 2 tablets (40 mg total) by mouth daily with breakfast for 5 days. 10 tablet Wilhemena Harbour, NP      PDMP not reviewed this encounter.   Wilhemena Harbour, NP 07/10/23 [phone]

## 2023-07-15 ENCOUNTER — Other Ambulatory Visit: Payer: Self-pay

## 2023-07-15 MED ORDER — QVAR REDIHALER 80 MCG/ACT IN AERB: INHALATION_SPRAY | RESPIRATORY_TRACT | 5 refills | Status: DC

## 2023-07-15 NOTE — Telephone Encounter (Signed)
 Called patient - DOB/NEED Updated DPR - LMOVM asking how patient was feeling since her UC visit on 07/09/23. Patient was advised provider still wanted her to have a chest xray - not done on 07/09/23 - order will be entered for her to go to nearest facility to have done.   Forwarding updated message to provider.

## 2023-07-15 NOTE — Telephone Encounter (Signed)
 Closing due to no response from office.

## 2023-07-16 ENCOUNTER — Ambulatory Visit (INDEPENDENT_AMBULATORY_CARE_PROVIDER_SITE_OTHER)

## 2023-07-16 ENCOUNTER — Encounter (HOSPITAL_COMMUNITY): Payer: Self-pay | Admitting: Emergency Medicine

## 2023-07-16 ENCOUNTER — Ambulatory Visit (HOSPITAL_COMMUNITY)
Admission: EM | Admit: 2023-07-16 | Discharge: 2023-07-16 | Disposition: A | Discharge status: Home / Self Care | Attending: Emergency Medicine | Admitting: Emergency Medicine

## 2023-07-16 DIAGNOSIS — R059 Cough, unspecified: Secondary | ICD-10-CM | POA: Diagnosis not present

## 2023-07-16 DIAGNOSIS — R051 Acute cough: Secondary | ICD-10-CM

## 2023-07-16 DIAGNOSIS — J4489 Other specified chronic obstructive pulmonary disease: Secondary | ICD-10-CM

## 2023-07-16 MED ORDER — AMOXICILLIN-POT CLAVULANATE 875-125 MG PO TABS: 1.0000 | ORAL_TABLET | Freq: Two times a day (BID) | ORAL | 0 refills | Status: AC

## 2023-07-16 MED ORDER — HYDROCODONE BIT-HOMATROP MBR 5-1.5 MG/5ML PO SOLN: 5.0000 mL | Freq: Four times a day (QID) | ORAL | 0 refills | Status: AC | PRN

## 2023-07-16 NOTE — ED Triage Notes (Signed)
 Pt reports that she was told to come to UC for a chest xray. Had cough for over a week. Reports phlegm now has a color. Reports syrup not helping with cough. Taking Coricidin

## 2023-07-16 NOTE — Discharge Instructions (Addendum)
 Your chest x-ray did not show obvious pneumonia.  With your history of COPD, I am putting you on an antibiotic.  Take this every 12 hours with food until finished.  Use the cough medication every 6 hours as needed for cough.  This does contain a narcotic, beware of drowsiness as a side effect and take this only as needed.  Consider sleeping with a humidifier.  Continue using over-the-counter Mucinex  to help loosen your secretions.  Ensure you are drinking at least 64 ounces of water daily.  If no improvement over the weekend, please follow-up with your pulmonologist.  Return to clinic for any new urgent symptoms.

## 2023-07-16 NOTE — ED Provider Notes (Addendum)
 [facility]    CSN: [id number] Arrival date & time: 07/16/23  1700      History   Chief Complaint Chief Complaint  Patient presents with   Cough    HPI Sue Mitchell is a 68 y.o. female.   Patient presents to clinic over concern of continued cough.  History of asthma and COPD.  Was seen at this clinic on 5/14 and given steroids for COPD and asthma exacerbation.  Patient completed the steroids.  The cough has continued.  Initially it was clear sputum, now it is a little productive.  She did take Mucinex  last night.  Does not any wheezing or shortness of breath.  Has not had fevers that she knows of.  The history is provided by the patient and medical records.  Cough   Past Medical History:  Diagnosis Date   Abdominal distension    Abdominal pain    Arthritis    Asthma    Biliary colic    COPD (chronic obstructive pulmonary disease) (HCC)    Diarrhea    GERD (gastroesophageal reflux disease)    Hypertension    Nausea     Patient Active Problem List   Diagnosis Date Noted   Former smoker 01/07/2022   Asthmatic bronchitis with exacerbation 11/21/2020   Atypical chest pain c/w IBS/ recurrent 09/27/2020   Healthcare maintenance 02/07/2020   Shoulder pain, left 01/14/2019   Pulmonary infiltrate 01/14/2019   Acute respiratory failure with hypoxia (HCC) 04/14/2018   Hemoptysis 04/11/2018   Influenza 04/11/2018   Cough 04/11/2018   Acute maxillary sinusitis 02/20/2018   Rhinitis, chronic 12/04/2017   Cough variant asthma  vs uacs with vcd 12/26/2016   Dysphonia 12/06/2016   Weakness 01/24/2016   Hypertension 02/23/2015   Obesity, Class I, BMI 30-34.9 02/23/2015   Pre-diabetes 02/23/2015    Past Surgical History:  Procedure Laterality Date   cartilage removal  2007   left knee   CESAREAN SECTION  1976   LAPAROSCOPY  1992 (approx)    abdominal    OB History   No obstetric history on file.      Home Medications    Prior to Admission  medications   Medication Sig Start Date End Date Taking? Authorizing Provider  amoxicillin -clavulanate (AUGMENTIN ) 875-125 MG tablet Take 1 tablet by mouth every 12 (twelve) hours. 07/16/23  Yes Jenille Laszlo  N, FNP  HYDROcodone  bit-homatropine (HYCODAN) 5-1.5 MG/5ML syrup Take 5 mLs by mouth every 6 (six) hours as needed for cough. 07/16/23  Yes Dallan Schonberg  N, FNP  acyclovir (ZOVIRAX) 200 MG capsule Take 200 mg by mouth 2 (two) times daily as needed (outbreaks). 01/22/11   [provider]  albuterol  (PROVENTIL ) (2.5 MG/3ML) 0.083% nebulizer solution Take 3 mLs (2.5 mg total) by nebulization every 6 (six) hours as needed for wheezing or shortness of breath. 04/22/23   Kozlow, Rema Care, MD  albuterol  (VENTOLIN  HFA) 108 (90 Base) MCG/ACT inhaler Inhale 2 puffs into the lungs every 4 (four) hours as needed for wheezing or shortness of breath. 07/01/23   Kozlow, Rema Care, MD  amLODipine  (NORVASC ) 5 MG tablet Take 1 tablet (5 mg total) by mouth daily. 04/15/18   Rai, Hurman Maiden, MD  ascorbic acid (VITAMIN C) 1000 MG tablet Take 1,000 mg by mouth daily. 02/23/15   [provider]  aspirin EC 81 MG tablet Take 81 mg by mouth daily. 12/06/16   [provider]  azithromycin  (ZITHROMAX ) 250 MG tablet Take 1 tablet (250 mg  total) by mouth daily. 07/01/23   Kozlow, Rema Care, MD  B Complex-Folic Acid (B COMPLEX VITAMINS, W/ FA,) CAPS Take 1 capsule by mouth daily. 02/23/15   [provider]  beclomethasone (QVAR  REDIHALER) 80 MCG/ACT inhaler Qvar  80 (or similar) with Albuterol  (Ventolin )  inhaler 2 inhalations TOGETHER every 4-6 hrs as needed during asthma flares only. 07/15/23   Kozlow, Rema Care, MD  budeson-glycopyrrolate-formoterol  (BREZTRI  AEROSPHERE) 160-9-4.8 MCG/ACT AERO inhaler Inhale 2 puffs into the lungs in the morning and at bedtime. Inhale two puffs with spacer twice daily to prevent cough or wheeze.  Rinse, gargle, and spit after use. 07/01/23   Kozlow, Rema Care, MD   cetirizine  (ZYRTEC ) 10 MG tablet Take 1 tablet (10 mg total) by mouth daily as needed for allergies (Can take an extra dose during flare up.). 07/01/23   Kozlow, Rema Care, MD  EPINEPHrine  (NEFFY) 2 MG/0.1ML SOLN Place 1 spray into the nose as needed. 04/22/23   Kozlow, Rema Care, MD  Ergocalciferol (VITAMIN D2) 10 MCG (400 UNIT) TABS Take 1 tablet by mouth daily. 02/23/15   [provider]  famotidine  (PEPCID ) 40 MG tablet Take 1 tablet (40 mg total) by mouth 2 (two) times daily. 07/01/23   Kozlow, Rema Care, MD  fluticasone  (FLONASE ) 50 MCG/ACT nasal spray Place 1 spray into both nostrils daily. 04/22/23   Kozlow, Rema Care, MD  fluticasone  (FLONASE ) 50 MCG/ACT nasal spray 1 spray each nostril 3-7 times per week 07/01/23   Kozlow, Rema Care, MD  gabapentin  (NEURONTIN ) 100 MG capsule TAKE ONE CAPSULE BY MOUTH THREE TIMES DAILY 05/26/23   Wert, Michael B, MD  hydrochlorothiazide  (MICROZIDE ) 12.5 MG capsule Take 1 capsule (12.5 mg total) by mouth daily. 04/15/18   Rai, Hurman Maiden, MD  Mometasone  Furoate (ASMANEX  HFA) 200 MCG/ACT AERO Inhale 2 Inhalations into the lungs every 4 (four) hours as needed. 12/31/22   Kozlow, Eric J, MD  Multiple Vitamin (MULTIVITAMIN) capsule Take 1 capsule by mouth daily. 02/23/15   [provider]  Omega 3 1000 MG CAPS Take 1,000 mg by mouth daily. 02/23/15   [provider]  promethazine -dextromethorphan  (PROMETHAZINE -DM) 6.25-15 MG/5ML syrup Take 2.5 mLs by mouth at bedtime as needed for cough. 07/09/23   Wilhemena Harbour, NP  QVAR  REDIHALER 80 MCG/ACT inhaler Inhale 2 puffs into the lungs every 4 (four) hours as needed (Take along with albuterol  during flare ups.). 05/01/23   Kozlow, Rema Care, MD  RABEprazole  (ACIPHEX ) 20 MG tablet TAKE 1 TABLET BY MOUTH 30-60 MINS BEFORE THE FIRST AND LAST MEAL OF THE DAY 05/26/23   Diamond Formica, MD  Respiratory Therapy Supplies (NEBULIZER MASK ADULT) MISC 1 kit by Does not apply route as needed. 12/31/22   Kozlow, Rema Care, MD   Spacer/Aero-Holding Idelle Majors DEVI 1 Device by Does not apply route as directed. 12/31/22   Kozlow, Eric J, MD  valACYclovir (VALTREX) 500 MG tablet Take 500 mg by mouth 2 (two) times daily. 04/10/22   [provider]  valsartan (DIOVAN) 40 MG tablet Take 1 tablet by mouth daily. 08/19/18   [provider]    Family History Family History  Problem Relation Age of Onset   Diabetes Father     Social History Social History   Tobacco Use   Smoking status: Former    Current packs/day: 0.00    Average packs/day: 0.5 packs/day for 25.0 years (12.5 ttl pk-yrs)    Types: Cigarettes    Start date: 36  Quit date: 2011    Years since quitting: 14.3   Smokeless tobacco: Never  Vaping Use   Vaping status: Never Used  Substance Use Topics   Alcohol use: Yes    Comment: occasional 2 per month   Drug use: No     Allergies   Codeine  and Ciprofloxacin   Review of Systems Review of Systems  Per HPI  Physical Exam Triage Vital Signs ED Triage Vitals  Encounter Vitals Group     BP 07/16/23 1820 (!) 145/77     Systolic BP Percentile --      Diastolic BP Percentile --      Pulse Rate 07/16/23 1820 82     Resp 07/16/23 1820 20     Temp 07/16/23 1820 98.2 F (36.8 C)     Temp Source 07/16/23 1820 Oral     SpO2 07/16/23 1820 97 %     Weight --      Height --      Head Circumference --      Peak Flow --      Pain Score 07/16/23 1819 0     Pain Loc --      Pain Education --      Exclude from Growth Chart --    No data found.  Updated Vital Signs BP (!) 145/77 (BP Location: Right Arm)   Pulse 82   Temp 98.2 F (36.8 C) (Oral)   Resp 20   SpO2 97%   Visual Acuity Right Eye Distance:   Left Eye Distance:   Bilateral Distance:    Right Eye Near:   Left Eye Near:    Bilateral Near:     Physical Exam Vitals and nursing note reviewed.  Constitutional:      Appearance: Normal appearance.  HENT:     Head: Normocephalic and atraumatic.     Right  Ear: External ear normal.     Left Ear: External ear normal.     Nose: Nose normal.     Mouth/Throat:     Mouth: Mucous membranes are moist.  Eyes:     Conjunctiva/sclera: Conjunctivae normal.  Cardiovascular:     Rate and Rhythm: Normal rate and regular rhythm.     Heart sounds: Normal heart sounds. No murmur heard. Pulmonary:     Effort: Pulmonary effort is normal. No respiratory distress.     Breath sounds: Wheezing present.  Musculoskeletal:        General: Normal range of motion.  Skin:    General: Skin is warm and dry.  Neurological:     General: No focal deficit present.     Mental Status: She is alert.  Psychiatric:        Mood and Affect: Mood normal.      UC Treatments / Results  Labs (all labs ordered are listed, but only abnormal results are displayed) Labs Reviewed - No data to display  EKG   Radiology DG Chest 2 View Result Date: 07/16/2023 CLINICAL DATA:  Cough for 1 week EXAM: CHEST - 2 VIEW COMPARISON:  11/21/2020 FINDINGS: The heart size and mediastinal contours are within normal limits. Both lungs are clear. The visualized skeletal structures are unremarkable. IMPRESSION: No active cardiopulmonary disease. Electronically Signed   By: Violeta Grey M.D.   On: 07/16/2023 19:35    Procedures Procedures (including critical care time)  Medications Ordered in UC Medications - No data to display  Initial Impression / Assessment and Plan / UC Course  I  have reviewed the triage vital signs and the nursing notes.  Pertinent labs & imaging results that were available during my care of the patient were reviewed by me and considered in my medical decision making (see chart for details).  Vitals in triage reviewed, patient is hemodynamically stable.  Expiratory wheezing noted.  Chest x-ray by my interpretation does not show any obvious infiltrate.  Does have increased sputum production, recently completed oral steroids.  Concern for COPD exacerbation, will treat  with empiric antibiotics with Augmentin .  Will change cough syrup.  Plan of care, follow-up care return precautions given, no questions at this time.  X-ray does not show any acute cardiopulmonary disease.  No updates to treatment plan at this time.     Final Clinical Impressions(s) / UC Diagnoses   Final diagnoses:  Acute cough  Asthma with COPD [facility])     Discharge Instructions      Your chest x-ray did not show obvious pneumonia.  With your history of COPD, I am putting you on an antibiotic.  Take this every 12 hours with food until finished.  Use the cough medication every 6 hours as needed for cough.  This does contain a narcotic, beware of drowsiness as a side effect and take this only as needed.  Consider sleeping with a humidifier.  Continue using over-the-counter Mucinex  to help loosen your secretions.  Ensure you are drinking at least 64 ounces of water daily.  If no improvement over the weekend, please follow-up with your pulmonologist.  Return to clinic for any new urgent symptoms.   ED Prescriptions     Medication Sig Dispense Auth. Provider   HYDROcodone  bit-homatropine (HYCODAN) 5-1.5 MG/5ML syrup Take 5 mLs by mouth every 6 (six) hours as needed for cough. 120 mL Harlow Lighter, Omega Slager  N, FNP   amoxicillin -clavulanate (AUGMENTIN ) 875-125 MG tablet Take 1 tablet by mouth every 12 (twelve) hours. 14 tablet Harlow Lighter, Sergi Gellner  N, FNP      PDMP not reviewed this encounter.   Harlow Lighter, Adeyemi Hamad  N, FNP 07/16/23 1918    Harlow Lighter, Donte Lenzo  N, FNP 07/16/23 [phone]

## 2023-07-17 ENCOUNTER — Other Ambulatory Visit: Payer: Self-pay

## 2023-07-17 ENCOUNTER — Ambulatory Visit (HOSPITAL_COMMUNITY): Payer: Self-pay

## 2023-07-17 MED ORDER — QVAR REDIHALER 80 MCG/ACT IN AERB: INHALATION_SPRAY | RESPIRATORY_TRACT | 5 refills | Status: AC

## 2023-07-23 ENCOUNTER — Telehealth: Payer: Self-pay | Admitting: Allergy and Immunology

## 2023-07-23 MED ORDER — BREZTRI AEROSPHERE 160-9-4.8 MCG/ACT IN AERO: 2.0000 | INHALATION_SPRAY | Freq: Two times a day (BID) | RESPIRATORY_TRACT | 1 refills | Status: DC

## 2023-07-23 NOTE — Telephone Encounter (Signed)
 PT called re: Breztri  rx, advising needs to be sent through [facility] for mail-in pharmacy because it's cheaper. She advised can find info on back of [facility] insurance card if needed

## 2023-07-23 NOTE — Telephone Encounter (Signed)
 LVM informing patient that Breztri  has been refilled again and sent to [facility] home delivery.

## 2023-08-18 ENCOUNTER — Ambulatory Visit: Admitting: Orthopedic Surgery

## 2023-08-18 ENCOUNTER — Ambulatory Visit (INDEPENDENT_AMBULATORY_CARE_PROVIDER_SITE_OTHER)

## 2023-08-18 ENCOUNTER — Encounter: Payer: Self-pay | Admitting: Orthopedic Surgery

## 2023-08-18 DIAGNOSIS — M25511 Pain in right shoulder: Secondary | ICD-10-CM

## 2023-08-18 MED ORDER — LIDOCAINE HCL 1 % IJ SOLN
5.0000 mL | INTRAMUSCULAR | Status: AC | PRN
Start: 2023-08-18 — End: 2023-08-18
  Administered 2023-08-18: 5 mL

## 2023-08-18 MED ORDER — METHYLPREDNISOLONE ACETATE 40 MG/ML IJ SUSP
40.0000 mg | INTRAMUSCULAR | Status: AC | PRN
Start: 2023-08-18 — End: 2023-08-18
  Administered 2023-08-18: 40 mg via INTRA_ARTICULAR

## 2023-08-18 NOTE — Progress Notes (Signed)
 Office Visit Note   Patient: Sue Mitchell           Date of Birth: [birth year]           MRN: [id number] Visit Date: 08/18/2023              Requested by: Benjamine Aland, MD [address] [place],  [place] [address] PCP: Benjamine Aland, MD  Chief Complaint  Patient presents with   Right Shoulder - Pain      HPI: 68 y/o female with a history of right shoulder pain.  This has progressed over the last 6-7 weeks.  She is unable to sleep on the right side.  She denies trauma, numbness or tingling in the hand.  She has tried multiple OTC rubs, ice and heat.    Assessment & Plan: Visit Diagnoses:  1. Acute pain of right shoulder     Plan: right shoulder rotator cuff pain.  Steroid injection was given.  Rest ice and gradual increase use of the shoulder.  Try not to lift objects above 90 degrees.    Follow-Up Instructions: No follow-ups on file.   Ortho Exam  Patient is alert, oriented, no adenopathy, well-dressed, normal affect, normal respiratory effort. Painful passive inter/external rotation of the shoulder.  Adduction increases the pain and discomfort.  Tenderness AC joint and bicep tendon area.  No muscle waisting is noted and she has a palpable radial pulse.      Imaging: 2 view shoulder x ray shows bone spurring minimal OA, no fracture and no abnormal lung field changes.    Labs: Lab Results  Component Value Date   REPTSTATUS 06/11/2013 FINAL 06/10/2013   CULT NO GROWTH Performed at [facility] 06/10/2013     Lab Results  Component Value Date   ALBUMIN 3.5 04/12/2018   ALBUMIN 4.5 12/13/2010    No results found for: MG No results found for: VD25OH  No results found for: PREALBUMIN    Latest Ref Rng & Units 06/25/2021    9:53 AM 04/14/2018    9:19 PM 04/14/2018    6:26 AM  CBC EXTENDED  WBC 4.0 - 10.5 K/uL 7.9  11.5  12.0   RBC 3.87 - 5.11 Mil/uL 4.59  4.12  3.80   Hemoglobin 12.0 - 15.0 g/dL 86.1  87.6  88.5   HCT 36.0 - 46.0 % 41.8  39.0   36.3   Platelets 150.0 - 400.0 K/uL 222.0  242  209   NEUT# 1.4 - 7.7 K/uL 5.0  7.4    Lymph# 0.7 - 4.0 K/uL 2.1  2.8       There is no height or weight on file to calculate BMI.  Orders:  Orders Placed This Encounter  Procedures   XR Shoulder Right   No orders of the defined types were placed in this encounter.    Procedures: Large Joint Inj: R subacromial bursa on 08/18/2023 4:51 PM Indications: diagnostic evaluation and pain Details: 22 G 1.5 in needle, posterior approach  Arthrogram: No  Medications: 5 mL lidocaine  1 %; 40 mg methylPREDNISolone  acetate 40 MG/ML Outcome: tolerated well, no immediate complications Procedure, treatment alternatives, risks and benefits explained, specific risks discussed. Consent was given by the patient. Immediately prior to procedure a time out was called to verify the correct patient, procedure, equipment, support staff and site/side marked as required. Patient was prepped and draped in the usual sterile fashion.      Clinical Data: No additional findings.  ROS:  All other systems negative, except as noted in the HPI. Review of Systems  Objective: Vital Signs: There were no vitals taken for this visit.  Specialty Comments:  No specialty comments available.  PMFS History: Patient Active Problem List   Diagnosis Date Noted   Former smoker 01/07/2022   Asthmatic bronchitis with exacerbation 11/21/2020   Atypical chest pain c/w IBS/ recurrent 09/27/2020   Healthcare maintenance 02/07/2020   Shoulder pain, left 01/14/2019   Pulmonary infiltrate 01/14/2019   Acute respiratory failure with hypoxia (HCC) 04/14/2018   Hemoptysis 04/11/2018   Influenza 04/11/2018   Cough 04/11/2018   Acute maxillary sinusitis 02/20/2018   Rhinitis, chronic 12/04/2017   Cough variant asthma  vs uacs with vcd 12/26/2016   Dysphonia 12/06/2016   Weakness 01/24/2016   Hypertension 02/23/2015   Obesity, Class I, BMI 30-34.9 02/23/2015    Pre-diabetes 02/23/2015   Past Medical History:  Diagnosis Date   Abdominal distension    Abdominal pain    Arthritis    Asthma    Biliary colic    COPD (chronic obstructive pulmonary disease) (HCC)    Diarrhea    GERD (gastroesophageal reflux disease)    Hypertension    Nausea     Family History  Problem Relation Age of Onset   Diabetes Father     Past Surgical History:  Procedure Laterality Date   cartilage removal  2007   left knee   CESAREAN SECTION  1976   LAPAROSCOPY  1992 (approx)    abdominal   Social History   Occupational History   Not on file  Tobacco Use   Smoking status: Former    Current packs/day: 0.00    Average packs/day: 0.5 packs/day for 25.0 years (12.5 ttl pk-yrs)    Types: Cigarettes    Start date: 25    Quit date: 2011    Years since quitting: 14.4   Smokeless tobacco: Never  Vaping Use   Vaping status: Never Used  Substance and Sexual Activity   Alcohol use: Yes    Comment: occasional 2 per month   Drug use: No   Sexual activity: Yes

## 2023-09-05 DIAGNOSIS — R7309 Other abnormal glucose: Secondary | ICD-10-CM | POA: Diagnosis not present

## 2023-09-05 DIAGNOSIS — E7800 Pure hypercholesterolemia, unspecified: Secondary | ICD-10-CM | POA: Diagnosis not present

## 2023-09-05 DIAGNOSIS — M130 Polyarthritis, unspecified: Secondary | ICD-10-CM | POA: Diagnosis not present

## 2023-09-05 DIAGNOSIS — I10 Essential (primary) hypertension: Secondary | ICD-10-CM | POA: Diagnosis not present

## 2023-09-11 DIAGNOSIS — R7309 Other abnormal glucose: Secondary | ICD-10-CM | POA: Diagnosis not present

## 2023-09-11 DIAGNOSIS — J301 Allergic rhinitis due to pollen: Secondary | ICD-10-CM | POA: Diagnosis not present

## 2023-09-11 DIAGNOSIS — I10 Essential (primary) hypertension: Secondary | ICD-10-CM | POA: Diagnosis not present

## 2023-09-30 ENCOUNTER — Other Ambulatory Visit: Payer: Self-pay

## 2023-09-30 ENCOUNTER — Ambulatory Visit: Admitting: Allergy and Immunology

## 2023-09-30 ENCOUNTER — Encounter: Payer: Self-pay | Admitting: Allergy and Immunology

## 2023-09-30 VITALS — BP 134/80 | HR 62 | Temp 97.9°F | Resp 18 | Ht <= 58 in | Wt 154.4 lb

## 2023-09-30 DIAGNOSIS — J3089 Other allergic rhinitis: Secondary | ICD-10-CM

## 2023-09-30 DIAGNOSIS — J441 Chronic obstructive pulmonary disease with (acute) exacerbation: Secondary | ICD-10-CM | POA: Diagnosis not present

## 2023-09-30 DIAGNOSIS — Z91038 Other insect allergy status: Secondary | ICD-10-CM

## 2023-09-30 DIAGNOSIS — K219 Gastro-esophageal reflux disease without esophagitis: Secondary | ICD-10-CM | POA: Diagnosis not present

## 2023-09-30 MED ORDER — NEFFY 2 MG/0.1ML NA SOLN
1.0000 | NASAL | 1 refills | Status: DC | PRN
Start: 2023-09-30 — End: 2023-10-02

## 2023-09-30 MED ORDER — CETIRIZINE HCL 10 MG PO TABS
10.0000 mg | ORAL_TABLET | Freq: Every day | ORAL | 1 refills | Status: AC | PRN
Start: 2023-09-30 — End: ?

## 2023-09-30 MED ORDER — FAMOTIDINE 40 MG PO TABS: 40.0000 mg | ORAL_TABLET | Freq: Two times a day (BID) | ORAL | 1 refills | Status: AC

## 2023-09-30 MED ORDER — BREZTRI AEROSPHERE 160-9-4.8 MCG/ACT IN AERO: 2.0000 | INHALATION_SPRAY | Freq: Two times a day (BID) | RESPIRATORY_TRACT | 1 refills | Status: AC

## 2023-09-30 MED ORDER — ALBUTEROL SULFATE HFA 108 (90 BASE) MCG/ACT IN AERS: 2.0000 | INHALATION_SPRAY | RESPIRATORY_TRACT | 1 refills | Status: AC | PRN

## 2023-09-30 MED ORDER — ALBUTEROL SULFATE (2.5 MG/3ML) 0.083% IN NEBU: 2.5000 mg | INHALATION_SOLUTION | Freq: Four times a day (QID) | RESPIRATORY_TRACT | 1 refills | Status: AC | PRN

## 2023-09-30 MED ORDER — NEBULIZER MASK ADULT MISC: 1.0000 | 1 refills | Status: AC | PRN

## 2023-09-30 MED ORDER — FLUTICASONE PROPIONATE 50 MCG/ACT NA SUSP: 1.0000 | Freq: Every day | NASAL | 1 refills | Status: AC

## 2023-09-30 MED ORDER — SPACER/AERO-HOLDING CHAMBERS DEVI: 1.0000 | 1 refills | Status: AC

## 2023-09-30 NOTE — Progress Notes (Unsigned)
 [place] - [place] - [place] - [place] - [place]   Follow-up Note  Referring Provider: Benjamine Aland, MD Primary Provider: Benjamine Aland, MD Date of Office Visit: 09/30/2023  Subjective:   Sue Mitchell (DOB: 29-Sep-1955) is a 68 y.o. female who [facility] in re-evaluation of the following:  HPI: Juanette returns to this clinic in evaluation of asthma/COPD overlap, allergic rhinitis, LPR, and history of hymenoptera venom hypersensitivity state.  I last saw her in this clinic 01 Jul 2023.  During her last visit we did give her a systemic steroid as she was having some problems with respiratory tract symptoms secondary to inflammation of her upper and lower airway and that really resulted in almost complete resolution of all respiratory tract symptoms.  She still has some occasional cough and she still has some occasional sneezing but overall she is much improved while she is now using Breztri  just 1 time per day, Flonase  1 time per day, and rare use of a short acting bronchodilator.  She believes that her reflux is under very good control while using her famotidine .  She is working through an issue with her right rotator cuff.  Apparently she had an injection into that joint recently and she is now able to move her arm much better.  She will be entering into physical therapy for her right shoulder at some point in the near future.  Allergies as of 09/30/2023       Reactions   Codeine  Hives, Itching, Other (See Comments)   All over the body, including burning sensation.   Ciprofloxacin Palpitations        Medication List    acyclovir 200 MG capsule Commonly known as: ZOVIRAX Take 200 mg by mouth 2 (two) times daily as needed (outbreaks).   albuterol  (2.5 MG/3ML) 0.083% nebulizer solution Commonly known as: PROVENTIL  Take 3 mLs (2.5 mg total) by nebulization every 6 (six) hours as needed for wheezing or shortness of breath.    albuterol  108 (90 Base) MCG/ACT inhaler Commonly known as: VENTOLIN  HFA Inhale 2 puffs into the lungs every 4 (four) hours as needed for wheezing or shortness of breath.   amLODipine  5 MG tablet Commonly known as: NORVASC  Take 1 tablet (5 mg total) by mouth daily.   amoxicillin -clavulanate 875-125 MG tablet Commonly known as: AUGMENTIN  Take 1 tablet by mouth every 12 (twelve) hours.   ascorbic acid 1000 MG tablet Commonly known as: VITAMIN C Take 1,000 mg by mouth daily.   Asmanex  HFA 200 MCG/ACT Aero Generic drug: Mometasone  Furoate Inhale 2 Inhalations into the lungs every 4 (four) hours as needed.   aspirin EC 81 MG tablet Take 81 mg by mouth daily.   azithromycin  250 MG tablet Commonly known as: Zithromax  Take 1 tablet (250 mg total) by mouth daily.   B Complex Vitamins (w/ FA) Caps Take 1 capsule by mouth daily.   Breztri  Aerosphere 160-9-4.8 MCG/ACT Aero inhaler Generic drug: budesonide -glycopyrrolate-formoterol  Inhale 2 puffs into the lungs in the morning and at bedtime. Inhale two puffs with spacer twice daily to prevent cough or wheeze.  Rinse, gargle, and spit after use.   cetirizine  10 MG tablet Commonly known as: ZYRTEC  Take 1 tablet (10 mg total) by mouth daily as needed for allergies (Can take an extra dose during flare up.).   famotidine  40 MG tablet Commonly known as: PEPCID  Take 1 tablet (40 mg total) by mouth 2 (two) times daily.   fluticasone  50  MCG/ACT nasal spray Commonly known as: FLONASE  Place 1 spray into both nostrils daily.   fluticasone  50 MCG/ACT nasal spray Commonly known as: FLONASE  1 spray each nostril 3-7 times per week   gabapentin  100 MG capsule Commonly known as: NEURONTIN  TAKE ONE CAPSULE BY MOUTH THREE TIMES DAILY   hydrochlorothiazide  12.5 MG capsule Commonly known as: MICROZIDE  Take 1 capsule (12.5 mg total) by mouth daily.   HYDROcodone  bit-homatropine 5-1.5 MG/5ML syrup Commonly known as: HYCODAN Take 5 mLs by  mouth every 6 (six) hours as needed for cough.   multivitamin capsule Take 1 capsule by mouth daily.   Nebulizer Mask Adult Misc 1 kit by Does not apply route as needed.   Neffy  2 MG/0.1ML Soln Generic drug: EPINEPHrine  Place 1 spray into the nose as needed.   Omega 3 1000 MG Caps Take 1,000 mg by mouth daily.   promethazine -dextromethorphan  6.25-15 MG/5ML syrup Commonly known as: PROMETHAZINE -DM Take 2.5 mLs by mouth at bedtime as needed for cough.   Qvar  RediHaler 80 MCG/ACT inhaler Generic drug: beclomethasone Inhale 2 puffs into the lungs every 4 (four) hours as needed (Take along with albuterol  during flare ups.).   Qvar  RediHaler 80 MCG/ACT inhaler Generic drug: beclomethasone Qvar  80 (or similar) with Albuterol  (Ventolin )  inhaler 2 inhalations TOGETHER every 4 as needed during asthma flares only.Qvar  80 (or similar) with Albuterol  (Ventolin )  inhaler 2 inhalations TOGETHER every 4-6 hrs as needed during asthma flares only.   RABEprazole  20 MG tablet Commonly known as: ACIPHEX  TAKE 1 TABLET BY MOUTH 30-60 MINS BEFORE THE FIRST AND LAST MEAL OF THE DAY   Spacer/Aero-Holding Raguel French 1 Device by Does not apply route as directed.   valACYclovir 500 MG tablet Commonly known as: VALTREX Take 500 mg by mouth 2 (two) times daily.   valsartan 40 MG tablet Commonly known as: DIOVAN Take 1 tablet by mouth daily.   Vitamin D2 10 MCG (400 UNIT) Tabs Take 1 tablet by mouth daily.    Past Medical History:  Diagnosis Date   Abdominal distension    Abdominal pain    Arthritis    Asthma    Biliary colic    COPD (chronic obstructive pulmonary disease) (HCC)    Diarrhea    GERD (gastroesophageal reflux disease)    Hypertension    Nausea     Past Surgical History:  Procedure Laterality Date   cartilage removal  2007   left knee   CESAREAN SECTION  1976   LAPAROSCOPY  1992 (approx)    abdominal    Review of systems negative except as noted in HPI / PMHx or  noted below:  Review of Systems  Constitutional: Negative.   HENT: Negative.    Eyes: Negative.   Respiratory: Negative.    Cardiovascular: Negative.   Gastrointestinal: Negative.   Genitourinary: Negative.   Musculoskeletal: Negative.   Skin: Negative.   Neurological: Negative.   Endo/Heme/Allergies: Negative.   Psychiatric/Behavioral: Negative.       Objective:   Vitals:   09/30/23 1134  BP: 134/80  Pulse: 62  Resp: 18  Temp: 97.9 F (36.6 C)  SpO2: 98%   Height: 4' 9.48 (146 cm)  Weight: 154 lb 6.4 oz (70 kg)   Physical Exam Constitutional:      Appearance: She is not diaphoretic.  HENT:     Head: Normocephalic.     Right Ear: Tympanic membrane, ear canal and external ear normal.     Left Ear: Tympanic membrane, ear  canal and external ear normal.     Nose: Nose normal. No mucosal edema or rhinorrhea.     Mouth/Throat:     Pharynx: Uvula midline. No oropharyngeal exudate.  Eyes:     Conjunctiva/sclera: Conjunctivae normal.  Neck:     Thyroid: No thyromegaly.     Trachea: Trachea normal. No tracheal tenderness or tracheal deviation.  Cardiovascular:     Rate and Rhythm: Normal rate and regular rhythm.     Heart sounds: Normal heart sounds, S1 normal and S2 normal. No murmur heard. Pulmonary:     Effort: No respiratory distress.     Breath sounds: Normal breath sounds. No stridor. No wheezing or rales.  Lymphadenopathy:     Head:     Right side of head: No tonsillar adenopathy.     Left side of head: No tonsillar adenopathy.     Cervical: No cervical adenopathy.  Skin:    Findings: No erythema or rash.     Nails: There is no clubbing.  Neurological:     Mental Status: She is alert.     Diagnostics: Spirometry was performed and demonstrated an FEV1 of 0.98 at 59 % of predicted.  Assessment and Plan:   1. Asthma with COPD with exacerbation (HCC)   2. Perennial allergic rhinitis   3. LPRD (laryngopharyngeal reflux disease)   4. History of  systemic reaction to hymenoptera sting    1.  Allergen avoidance measures - dust mite  2.  Treat and prevent inflammation of airway:   A.  BREZTRI  -2 inhalations 1-2 times per day w/S (empty lungs)  B.  Flonase  -1-2 sprays each nostril 1 time per day  3.  Treat and prevent reflux/LPR:   A.  Famotidine  40 mg -1 tablet 2 times per day  B.  Consolidate all forms of caffeine consumption  C.  Replace throat clearing with swallowing/drinking maneuver  4.  If needed:   A.  Albuterol  + Qvar  80 -2 inhalations TOGETHER every 4-6 hrs B.  Albuterol   neb + Qvar  80  - 2 inhaltions TOGETHER every 4-6 hrs  C.  Cetirizine  10 mg -1 tablet 1 time per day  D.  Epi-pen, benadryl, MD/ER evaluation for allergic reaction  5. Return to clinic in 6 months or earlier if problem  6. Influenza = Tamiflu . Covid = Paxlovid  Akire appears to be doing pretty well at this point in time while utilizing anti-inflammatory agents for both her upper and lower airway and also addressing the issue of her reflux induced respiratory disease with a proton pump inhibitor.  I am not going to change any of her therapy today.  We will assume she will continue to do well over the course of the next 6 months on this plan and we will see her back in this clinic at that point in time or earlier if there is a problem.  Camellia Denis, MD Allergy  / Immunology [facility]

## 2023-09-30 NOTE — Patient Instructions (Addendum)
  1.  Allergen avoidance measures - dust mite  2.  Treat and prevent inflammation of airway:   A.  BREZTRI  -2 inhalations 1-2 times per day w/S (empty lungs)  B.  Flonase  -1-2 sprays each nostril 1 time per day  3.  Treat and prevent reflux/LPR:   A.  Famotidine  40 mg -1 tablet 2 times per day  B.  Consolidate all forms of caffeine consumption  C.  Replace throat clearing with swallowing/drinking maneuver  4.  If needed:   A.  Albuterol  + Qvar  80 -2 inhalations TOGETHER every 4-6 hrs B.  Albuterol   neb + Qvar  80  - 2 inhaltions TOGETHER every 4-6 hrs  C.  Cetirizine  10 mg -1 tablet 1 time per day  D.  Epi-pen, benadryl, MD/ER evaluation for allergic reaction  5. Return to clinic in 6 months or earlier if problem  6. Influenza = Tamiflu . Covid = Paxlovid

## 2023-10-01 ENCOUNTER — Encounter: Payer: Self-pay | Admitting: Allergy and Immunology

## 2023-10-01 ENCOUNTER — Telehealth: Payer: Self-pay

## 2023-10-01 NOTE — Telephone Encounter (Signed)
*  [facility]  Pharmacy Patient Advocate Encounter   Received notification from Fax that prior authorization for Neffy  2mg  is required/requested.   Insurance verification completed.   The patient is insured through [facility] .   Per test claim:  Injectable Epinephrine  is preferred by the insurance.  If suggested medication is appropriate, Please send in a new RX and discontinue this one. If not, please advise as to why it's not appropriate so that we may request a Prior Authorization. Please note, some preferred medications may still require a PA.  If the suggested medications have not been trialed and there are no contraindications to their use, the PA will not be submitted, as it will not be approved.

## 2023-10-02 MED ORDER — EPINEPHRINE 0.3 MG/0.3ML IJ SOAJ: 0.3000 mg | Freq: Once | INTRAMUSCULAR | Status: AC

## 2023-10-02 NOTE — Telephone Encounter (Signed)
 I called to inform patient of the denial and that we sent in the Epipen  to the pharmacy.

## 2023-11-19 DIAGNOSIS — H5213 Myopia, bilateral: Secondary | ICD-10-CM | POA: Diagnosis not present

## 2023-11-19 DIAGNOSIS — H25813 Combined forms of age-related cataract, bilateral: Secondary | ICD-10-CM | POA: Diagnosis not present

## 2023-11-19 DIAGNOSIS — H35371 Puckering of macula, right eye: Secondary | ICD-10-CM | POA: Diagnosis not present

## 2023-12-24 ENCOUNTER — Encounter: Payer: Self-pay | Admitting: *Deleted

## 2023-12-24 NOTE — Progress Notes (Signed)
 Sue Mitchell                                          MRN: [id number]   12/24/2023   The [facility] Quality Team Specialist reviewed this patient medical record for the purposes of chart review for care gap closure. The following were reviewed: chart review for care gap closure-glycemic status assessment and kidney health evaluation for diabetes:eGFR  and uACR.    [facility]I Quality Team

## 2023-12-29 ENCOUNTER — Encounter: Payer: Self-pay | Admitting: Radiology

## 2024-01-31 ENCOUNTER — Ambulatory Visit (HOSPITAL_COMMUNITY)

## 2024-01-31 ENCOUNTER — Other Ambulatory Visit: Payer: Self-pay

## 2024-01-31 ENCOUNTER — Ambulatory Visit (HOSPITAL_COMMUNITY): Admission: EM | Admit: 2024-01-31 | Discharge: 2024-01-31 | Disposition: A | Discharge status: Home / Self Care

## 2024-01-31 ENCOUNTER — Encounter (HOSPITAL_COMMUNITY): Payer: Self-pay | Admitting: *Deleted

## 2024-01-31 DIAGNOSIS — R0600 Dyspnea, unspecified: Secondary | ICD-10-CM

## 2024-01-31 DIAGNOSIS — J441 Chronic obstructive pulmonary disease with (acute) exacerbation: Secondary | ICD-10-CM | POA: Diagnosis not present

## 2024-01-31 MED ORDER — IPRATROPIUM-ALBUTEROL 0.5-2.5 (3) MG/3ML IN SOLN
RESPIRATORY_TRACT | Status: AC
  Filled 2024-01-31: qty 3

## 2024-01-31 MED ORDER — PREDNISONE 20 MG PO TABS: 40.0000 mg | ORAL_TABLET | Freq: Every day | ORAL | 0 refills | Status: AC

## 2024-01-31 MED ORDER — IPRATROPIUM-ALBUTEROL 0.5-2.5 (3) MG/3ML IN SOLN
3.0000 mL | Freq: Once | RESPIRATORY_TRACT | Status: AC
  Administered 2024-01-31: 3 mL via RESPIRATORY_TRACT

## 2024-01-31 NOTE — ED Triage Notes (Signed)
 C/O starting with rhinorrhea approx 1 wk ago. States last night started with cough, wheezing, and dyspnea, which has continued into today. Has been using albuterol  inhaler & neb at home - states last used inhaler this AM & has not used neb. Pt states she is concerned for pneumonia.

## 2024-01-31 NOTE — ED Provider Notes (Signed)
 [facility]    CSN: [id number] Arrival date & time: 01/31/24  1309      History   Chief Complaint Chief Complaint  Patient presents with   Cough   Shortness of Breath    HPI Sue Mitchell is a 68 y.o. female.   Patient with history of asthma and COPD here for evaluation of cough x 3 days.  She reports she has had URI-like symptoms for approximately 1 week when the cough started.  Cough is constant, semiproductive.  She reports dyspnea, wheezing.  She has been using her albuterol  inhaler last use last night which did help.    Past Medical History:  Diagnosis Date   Abdominal distension    Abdominal pain    Arthritis    Asthma    Biliary colic    COPD (chronic obstructive pulmonary disease) (HCC)    Diarrhea    GERD (gastroesophageal reflux disease)    Hypertension    Nausea     Patient Active Problem List   Diagnosis Date Noted   Former smoker 01/07/2022   Asthmatic bronchitis with exacerbation 11/21/2020   Atypical chest pain c/w IBS/ recurrent 09/27/2020   Healthcare maintenance 02/07/2020   Shoulder pain, left 01/14/2019   Pulmonary infiltrate 01/14/2019   Acute respiratory failure with hypoxia (HCC) 04/14/2018   Hemoptysis 04/11/2018   Influenza 04/11/2018   Cough 04/11/2018   Acute maxillary sinusitis 02/20/2018   Rhinitis, chronic 12/04/2017   Cough variant asthma  vs uacs with vcd 12/26/2016   Dysphonia 12/06/2016   Weakness 01/24/2016   Hypertension 02/23/2015   Obesity, Class I, BMI 30-34.9 02/23/2015   Pre-diabetes 02/23/2015    Past Surgical History:  Procedure Laterality Date   cartilage removal  2007   left knee   CESAREAN SECTION  1976   LAPAROSCOPY  1992 (approx)    abdominal    OB History   No obstetric history on file.      Home Medications    Prior to Admission medications   Medication Sig Start Date End Date Taking? Authorizing Provider  albuterol  (VENTOLIN  HFA) 108 (90 Base) MCG/ACT inhaler Inhale 2 puffs  into the lungs every 4 (four) hours as needed for wheezing or shortness of breath. 09/30/23  Yes Kozlow, Camellia PARAS, MD  amLODipine  (NORVASC ) 5 MG tablet Take 1 tablet (5 mg total) by mouth daily. 04/15/18  Yes Rai, Ripudeep K, MD  ascorbic acid (VITAMIN C) 1000 MG tablet Take 1,000 mg by mouth daily. 02/23/15  Yes [provider]  aspirin EC 81 MG tablet Take 81 mg by mouth daily. 12/06/16  Yes [provider]  beclomethasone (QVAR  REDIHALER) 80 MCG/ACT inhaler Qvar  80 (or similar) with Albuterol  (Ventolin )  inhaler 2 inhalations TOGETHER every 4 as needed during asthma flares only.Qvar  80 (or similar) with Albuterol  (Ventolin )  inhaler 2 inhalations TOGETHER every 4-6 hrs as needed during asthma flares only. 07/17/23  Yes Kozlow, Camellia PARAS, MD  budesonide -glycopyrrolate-formoterol  (BREZTRI  AEROSPHERE) 160-9-4.8 MCG/ACT AERO inhaler Inhale 2 puffs into the lungs in the morning and at bedtime. Inhale two puffs with spacer twice daily to prevent cough or wheeze.  Rinse, gargle, and spit after use. 09/30/23  Yes Kozlow, Camellia PARAS, MD  cetirizine  (ZYRTEC ) 10 MG tablet Take 1 tablet (10 mg total) by mouth daily as needed for allergies (Can take an extra dose during flare up.). 09/30/23  Yes Kozlow, Eric J, MD  Ergocalciferol (VITAMIN D2) 10 MCG (400 UNIT) TABS Take 1 tablet by mouth  daily. 02/23/15  Yes [provider]  famotidine  (PEPCID ) 40 MG tablet Take 1 tablet (40 mg total) by mouth 2 (two) times daily. 09/30/23  Yes Kozlow, Camellia PARAS, MD  fluticasone  (FLONASE ) 50 MCG/ACT nasal spray Place 1 spray into both nostrils daily. 09/30/23  Yes Kozlow, Camellia PARAS, MD  hydrochlorothiazide  (MICROZIDE ) 12.5 MG capsule Take 1 capsule (12.5 mg total) by mouth daily. 04/15/18  Yes Rai, Ripudeep K, MD  Multiple Vitamin (MULTIVITAMIN) capsule Take 1 capsule by mouth daily. 02/23/15  Yes [provider]  Omega 3 1000 MG CAPS Take 1,000 mg by mouth daily. 02/23/15  Yes [provider]  TURMERIC PO Take  by mouth.   Yes [provider]  valsartan (DIOVAN) 40 MG tablet Take 1 tablet by mouth daily. 08/19/18  Yes [provider]  acyclovir (ZOVIRAX) 200 MG capsule Take 200 mg by mouth 2 (two) times daily as needed (outbreaks). 01/22/11   [provider]  albuterol  (PROVENTIL ) (2.5 MG/3ML) 0.083% nebulizer solution Take 3 mLs (2.5 mg total) by nebulization every 6 (six) hours as needed for wheezing or shortness of breath. 09/30/23   Kozlow, Camellia PARAS, MD  amoxicillin -clavulanate (AUGMENTIN ) 875-125 MG tablet Take 1 tablet by mouth every 12 (twelve) hours. Patient not taking: No sig reported 07/16/23   Dreama, Georgia  N, FNP  azithromycin  (ZITHROMAX ) 250 MG tablet Take 1 tablet (250 mg total) by mouth daily. Patient not taking: No sig reported 07/01/23   Kozlow, Camellia PARAS, MD  B Complex-Folic Acid (B COMPLEX VITAMINS, W/ FA,) CAPS Take 1 capsule by mouth daily. 02/23/15   [provider]  gabapentin  (NEURONTIN ) 100 MG capsule TAKE ONE CAPSULE BY MOUTH THREE TIMES DAILY 05/26/23   Wert, Michael B, MD  HYDROcodone  bit-homatropine (HYCODAN) 5-1.5 MG/5ML syrup Take 5 mLs by mouth every 6 (six) hours as needed for cough. Patient not taking: No sig reported 07/16/23   Dreama, [place]  N, FNP  Mometasone  Furoate (ASMANEX  HFA) 200 MCG/ACT AERO Inhale 2 Inhalations into the lungs every 4 (four) hours as needed. 12/31/22   Kozlow, Camellia PARAS, MD  predniSONE  (DELTASONE ) 20 MG tablet Take 2 tablets (40 mg total) by mouth daily with breakfast for 5 days. 01/31/24 02/05/24 Yes Juleen Rush, PA-C  promethazine -dextromethorphan  (PROMETHAZINE -DM) 6.25-15 MG/5ML syrup Take 2.5 mLs by mouth at bedtime as needed for cough. Patient not taking: Reported on 09/30/2023 07/09/23   Chandra Harlene LABOR, NP  QVAR  REDIHALER 80 MCG/ACT inhaler Inhale 2 puffs into the lungs every 4 (four) hours as needed (Take along with albuterol  during flare ups.). 05/01/23   Kozlow, Camellia PARAS, MD  RABEprazole  (ACIPHEX ) 20 MG tablet  TAKE 1 TABLET BY MOUTH 30-60 MINS BEFORE THE FIRST AND LAST MEAL OF THE DAY 05/26/23   Darlean Ozell NOVAK, MD  Respiratory Therapy Supplies (NEBULIZER MASK ADULT) MISC 1 kit by Does not apply route as needed. 09/30/23   Kozlow, Camellia PARAS, MD  Spacer/Aero-Holding Raguel DEVI 1 Device by Does not apply route as directed. 09/30/23   Kozlow, Camellia PARAS, MD  valACYclovir (VALTREX) 500 MG tablet Take 500 mg by mouth 2 (two) times daily. 04/10/22   [provider]    Family History Family History  Problem Relation Age of Onset   Diabetes Father     Social History Social History   Tobacco Use   Smoking status: Former    Current packs/day: 0.00    Average packs/day: 0.5 packs/day for 25.0 years (12.5 ttl pk-yrs)    Types: Cigarettes  Start date: 49    Quit date: 2011    Years since quitting: 14.9   Smokeless tobacco: Never  Vaping Use   Vaping status: Never Used  Substance Use Topics   Alcohol use: Yes    Comment: occasionally   Drug use: No     Allergies   Codeine  and Ciprofloxacin   Review of Systems Review of Systems  Constitutional:  Negative for chills, fatigue and fever.  HENT:  Positive for congestion and rhinorrhea. Negative for ear pain, nosebleeds, postnasal drip, sinus pressure, sinus pain and sore throat.   Eyes:  Negative for pain and redness.  Respiratory:  Positive for cough, shortness of breath and wheezing.   Gastrointestinal:  Negative for abdominal pain, diarrhea, nausea and vomiting.  Musculoskeletal:  Negative for arthralgias and myalgias.  Skin:  Negative for rash.  Neurological:  Negative for light-headedness and headaches.  Hematological:  Negative for adenopathy. Does not bruise/bleed easily.  Psychiatric/Behavioral:  Negative for confusion and sleep disturbance.      Physical Exam Triage Vital Signs ED Triage Vitals  Encounter Vitals Group     BP 01/31/24 1314 125/71     Girls Systolic BP Percentile --      Girls Diastolic BP Percentile --       Boys Systolic BP Percentile --      Boys Diastolic BP Percentile --      Pulse Rate 01/31/24 1312 82     Resp 01/31/24 1312 (!) 22     Temp 01/31/24 1314 98.2 F (36.8 C)     Temp Source 01/31/24 1314 Oral     SpO2 01/31/24 1312 96 %     Weight --      Height --      Head Circumference --      Peak Flow --      Pain Score 01/31/24 1315 0     Pain Loc --      Pain Education --      Exclude from Growth Chart --    No data found.  Updated Vital Signs BP 125/71   Pulse 82   Temp 98.2 F (36.8 C) (Oral)   Resp (!) 24   SpO2 96%   Visual Acuity Right Eye Distance:   Left Eye Distance:   Bilateral Distance:    Right Eye Near:   Left Eye Near:    Bilateral Near:     Physical Exam Vitals and nursing note reviewed.  Constitutional:      General: She is not in acute distress.    Appearance: Normal appearance. She is not ill-appearing.  HENT:     Head: Normocephalic and atraumatic.     Right Ear: Tympanic membrane and ear canal normal.     Left Ear: Tympanic membrane and ear canal normal.     Nose: No congestion or rhinorrhea.     Mouth/Throat:     Pharynx: No oropharyngeal exudate or posterior oropharyngeal erythema.  Eyes:     General: No scleral icterus.    Extraocular Movements: Extraocular movements intact.     Conjunctiva/sclera: Conjunctivae normal.  Cardiovascular:     Rate and Rhythm: Normal rate and regular rhythm.     Heart sounds: No murmur heard. Pulmonary:     Effort: Pulmonary effort is normal. No respiratory distress.     Breath sounds: Normal breath sounds. No wheezing or rales.     Comments: Coughing profusely Musculoskeletal:     Cervical back: Normal range of motion.  No rigidity.  Lymphadenopathy:     Cervical: No cervical adenopathy.  Skin:    Capillary Refill: Capillary refill takes less than 2 seconds.     Coloration: Skin is not jaundiced.     Findings: No rash.  Neurological:     General: No focal deficit present.     Mental Status:  She is alert and oriented to person, place, and time.     Motor: No weakness.     Gait: Gait normal.  Psychiatric:        Mood and Affect: Mood normal.        Behavior: Behavior normal.      UC Treatments / Results  Labs (all labs ordered are listed, but only abnormal results are displayed) Labs Reviewed - No data to display  EKG   Radiology DG Chest 2 View Result Date: 01/31/2024 CLINICAL DATA:  Cough for 1 week EXAM: DG CHEST 2V COMPARISON:  07/16/2023 FINDINGS: The heart size and mediastinal contours are within normal limits. Both lungs are clear. The visualized skeletal structures are unremarkable. IMPRESSION: No active cardiopulmonary disease. Electronically Signed   By: Ozell Daring M.D.   On: 01/31/2024 14:04    Procedures Procedures (including critical care time)  Medications Ordered in UC Medications  ipratropium-albuterol  (DUONEB) 0.5-2.5 (3) MG/3ML nebulizer solution 3 mL (3 mLs Nebulization Given 01/31/24 1401)    Initial Impression / Assessment and Plan / UC Course  I have reviewed the triage vital signs and the nursing notes.  Pertinent labs & imaging results that were available during my care of the patient were reviewed by me and considered in my medical decision making (see chart for details).     Vital signs stable. Chest x-ray without acute abnormality. Take medication as prescribed-started on prednisone  40 mg daily x 5 days. Use albuterol  every 4 hours for the next 24 hours then every 4 hours as needed. Follow-up with primary care. Return or go to the emergency room if symptoms worsen or fail to improve. Final Clinical Impressions(s) / UC Diagnoses   Final diagnoses:  Dyspnea, unspecified type  COPD exacerbation (HCC)     Discharge Instructions      Take medication as prescribed Use albuterol  every 4 hours for the next 24 hours, then every 4 hours as needed thereafter. Follow up with PCP / pulmonologist Go to ED if symptoms fail to  improve or worsen.     ED Prescriptions     Medication Sig Dispense Auth. Provider   predniSONE  (DELTASONE ) 20 MG tablet Take 2 tablets (40 mg total) by mouth daily with breakfast for 5 days. 10 tablet Juleen Rush, PA-C      PDMP not reviewed this encounter.   Juleen Rush, PA-C 01/31/24 1414

## 2024-01-31 NOTE — Discharge Instructions (Signed)
 Take medication as prescribed Use albuterol  every 4 hours for the next 24 hours, then every 4 hours as needed thereafter. Follow up with PCP / pulmonologist Go to ED if symptoms fail to improve or worsen.

## 2024-02-23 ENCOUNTER — Ambulatory Visit: Admitting: Family Medicine

## 2024-04-05 ENCOUNTER — Ambulatory Visit: Admitting: Family Medicine
